# Patient Record
Sex: Female | Born: 1980 | Race: Black or African American | Hispanic: No | Marital: Married | Smoking: Former smoker
Health system: Southern US, Community
[De-identification: ages and names within clinical notes are randomized; demographics above are authoritative.]

## PROBLEM LIST (undated history)

## (undated) ENCOUNTER — Inpatient Hospital Stay (HOSPITAL_COMMUNITY): Payer: BLUE CROSS/BLUE SHIELD

## (undated) DIAGNOSIS — D649 Anemia, unspecified: Secondary | ICD-10-CM

## (undated) DIAGNOSIS — O139 Gestational [pregnancy-induced] hypertension without significant proteinuria, unspecified trimester: Secondary | ICD-10-CM

## (undated) DIAGNOSIS — F419 Anxiety disorder, unspecified: Secondary | ICD-10-CM

## (undated) DIAGNOSIS — I4729 Other ventricular tachycardia: Secondary | ICD-10-CM

## (undated) DIAGNOSIS — E0500 Thyrotoxicosis with diffuse goiter without thyrotoxic crisis or storm: Secondary | ICD-10-CM

## (undated) DIAGNOSIS — L732 Hidradenitis suppurativa: Secondary | ICD-10-CM

## (undated) DIAGNOSIS — D573 Sickle-cell trait: Secondary | ICD-10-CM

## (undated) DIAGNOSIS — D563 Thalassemia minor: Secondary | ICD-10-CM

## (undated) DIAGNOSIS — D571 Sickle-cell disease without crisis: Secondary | ICD-10-CM

## (undated) DIAGNOSIS — M1990 Unspecified osteoarthritis, unspecified site: Secondary | ICD-10-CM

## (undated) DIAGNOSIS — F319 Bipolar disorder, unspecified: Secondary | ICD-10-CM

## (undated) DIAGNOSIS — R002 Palpitations: Secondary | ICD-10-CM

## (undated) DIAGNOSIS — F32A Depression, unspecified: Secondary | ICD-10-CM

## (undated) DIAGNOSIS — Z8632 Personal history of gestational diabetes: Secondary | ICD-10-CM

## (undated) DIAGNOSIS — Z8759 Personal history of other complications of pregnancy, childbirth and the puerperium: Secondary | ICD-10-CM

## (undated) DIAGNOSIS — E782 Mixed hyperlipidemia: Secondary | ICD-10-CM

## (undated) DIAGNOSIS — N921 Excessive and frequent menstruation with irregular cycle: Secondary | ICD-10-CM

## (undated) DIAGNOSIS — F329 Major depressive disorder, single episode, unspecified: Secondary | ICD-10-CM

## (undated) DIAGNOSIS — O24419 Gestational diabetes mellitus in pregnancy, unspecified control: Secondary | ICD-10-CM

## (undated) DIAGNOSIS — E079 Disorder of thyroid, unspecified: Secondary | ICD-10-CM

## (undated) DIAGNOSIS — E039 Hypothyroidism, unspecified: Secondary | ICD-10-CM

## (undated) HISTORY — DX: Gestational (pregnancy-induced) hypertension without significant proteinuria, unspecified trimester: O13.9

## (undated) HISTORY — PX: COLPOSCOPY: SHX161

## (undated) HISTORY — PX: TUBAL LIGATION: SHX77

## (undated) HISTORY — DX: Disorder of thyroid, unspecified: E07.9

## (undated) HISTORY — DX: Hidradenitis suppurativa: L73.2

## (undated) HISTORY — DX: Sickle-cell disease without crisis: D57.1

---
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
## 2003-09-22 ENCOUNTER — Emergency Department (HOSPITAL_COMMUNITY): Admission: EM | Admit: 2003-09-22 | Discharge: 2003-09-22 | Payer: Self-pay | Admitting: Family Medicine

## 2004-07-07 ENCOUNTER — Emergency Department (HOSPITAL_COMMUNITY): Admission: EM | Admit: 2004-07-07 | Discharge: 2004-07-07 | Payer: Self-pay | Admitting: Emergency Medicine

## 2005-03-21 ENCOUNTER — Inpatient Hospital Stay (HOSPITAL_COMMUNITY): Admission: AD | Admit: 2005-03-21 | Discharge: 2005-03-21 | Payer: Self-pay | Admitting: *Deleted

## 2005-12-07 ENCOUNTER — Emergency Department (HOSPITAL_COMMUNITY): Admission: EM | Admit: 2005-12-07 | Discharge: 2005-12-07 | Payer: Self-pay | Admitting: Emergency Medicine

## 2006-02-10 ENCOUNTER — Emergency Department (HOSPITAL_COMMUNITY): Admission: EM | Admit: 2006-02-10 | Discharge: 2006-02-10 | Payer: Self-pay | Admitting: Family Medicine

## 2009-01-06 ENCOUNTER — Emergency Department (HOSPITAL_COMMUNITY): Admission: EM | Admit: 2009-01-06 | Discharge: 2009-01-06 | Payer: Self-pay | Admitting: Family Medicine

## 2010-06-30 ENCOUNTER — Inpatient Hospital Stay (HOSPITAL_COMMUNITY)
Admission: AD | Admit: 2010-06-30 | Discharge: 2010-06-30 | Disposition: A | Discharge status: Home / Self Care | Payer: Self-pay | Source: Ambulatory Visit | Attending: Obstetrics & Gynecology | Admitting: Obstetrics & Gynecology

## 2010-06-30 ENCOUNTER — Inpatient Hospital Stay (HOSPITAL_COMMUNITY): Payer: Self-pay

## 2010-06-30 DIAGNOSIS — R109 Unspecified abdominal pain: Secondary | ICD-10-CM

## 2010-06-30 DIAGNOSIS — N920 Excessive and frequent menstruation with regular cycle: Secondary | ICD-10-CM

## 2010-06-30 DIAGNOSIS — R58 Hemorrhage, not elsewhere classified: Secondary | ICD-10-CM

## 2010-06-30 DIAGNOSIS — N83209 Unspecified ovarian cyst, unspecified side: Secondary | ICD-10-CM | POA: Insufficient documentation

## 2010-06-30 LAB — URINALYSIS, ROUTINE W REFLEX MICROSCOPIC
Bilirubin Urine: NEGATIVE
Hgb urine dipstick: NEGATIVE
Ketones, ur: 15 mg/dL — AB
Nitrite: NEGATIVE
Protein, ur: NEGATIVE mg/dL
Specific Gravity, Urine: >1.03 — ABNORMAL HIGH (ref 1.005–1.030)
Urine Glucose, Fasting: NEGATIVE mg/dL
Urobilinogen, UA: 0.2 mg/dL (ref 0.0–1.0)
pH: 5.5 (ref 5.0–8.0)

## 2010-06-30 LAB — POCT PREGNANCY, URINE: Preg Test, Ur: NEGATIVE

## 2010-06-30 LAB — WET PREP, GENITAL
Trich, Wet Prep: NONE SEEN
Yeast Wet Prep HPF POC: NONE SEEN

## 2010-07-28 ENCOUNTER — Encounter: Payer: Self-pay | Admitting: Physician Assistant

## 2011-02-02 ENCOUNTER — Emergency Department (HOSPITAL_COMMUNITY)
Admission: EM | Admit: 2011-02-02 | Discharge: 2011-02-02 | Disposition: A | Discharge status: Home / Self Care | Payer: Self-pay | Attending: Emergency Medicine | Admitting: Emergency Medicine

## 2011-02-02 ENCOUNTER — Emergency Department (HOSPITAL_COMMUNITY): Payer: Self-pay

## 2011-02-02 DIAGNOSIS — J069 Acute upper respiratory infection, unspecified: Secondary | ICD-10-CM | POA: Insufficient documentation

## 2011-02-02 DIAGNOSIS — R0602 Shortness of breath: Secondary | ICD-10-CM | POA: Insufficient documentation

## 2011-02-02 DIAGNOSIS — J029 Acute pharyngitis, unspecified: Secondary | ICD-10-CM | POA: Insufficient documentation

## 2011-02-02 DIAGNOSIS — R059 Cough, unspecified: Secondary | ICD-10-CM | POA: Insufficient documentation

## 2011-02-02 DIAGNOSIS — D573 Sickle-cell trait: Secondary | ICD-10-CM | POA: Insufficient documentation

## 2011-02-02 DIAGNOSIS — J3489 Other specified disorders of nose and nasal sinuses: Secondary | ICD-10-CM | POA: Insufficient documentation

## 2011-02-02 DIAGNOSIS — R05 Cough: Secondary | ICD-10-CM | POA: Insufficient documentation

## 2011-07-21 DIAGNOSIS — Z9151 Personal history of suicidal behavior: Secondary | ICD-10-CM

## 2011-07-21 HISTORY — DX: Personal history of suicidal behavior: Z91.51

## 2011-08-03 ENCOUNTER — Encounter (HOSPITAL_COMMUNITY): Payer: Self-pay | Admitting: Emergency Medicine

## 2011-08-03 ENCOUNTER — Emergency Department (HOSPITAL_COMMUNITY)
Admission: EM | Admit: 2011-08-03 | Discharge: 2011-08-04 | Disposition: A | Discharge status: Other Acute Inpatient Hospital | Payer: Self-pay | Attending: Emergency Medicine | Admitting: Emergency Medicine

## 2011-08-03 DIAGNOSIS — R45851 Suicidal ideations: Secondary | ICD-10-CM | POA: Insufficient documentation

## 2011-08-03 DIAGNOSIS — F329 Major depressive disorder, single episode, unspecified: Secondary | ICD-10-CM

## 2011-08-03 DIAGNOSIS — F32A Depression, unspecified: Secondary | ICD-10-CM

## 2011-08-03 DIAGNOSIS — D573 Sickle-cell trait: Secondary | ICD-10-CM | POA: Insufficient documentation

## 2011-08-03 DIAGNOSIS — F3289 Other specified depressive episodes: Secondary | ICD-10-CM | POA: Insufficient documentation

## 2011-08-03 HISTORY — DX: Sickle-cell trait: D57.3

## 2011-08-03 NOTE — ED Notes (Signed)
Pt brought by the EMS, pt attempted the suicide, pt tried to cut her left wrist, used a broke light bulbe, at this time bleeding controled, superficial laceration, scuff, CMS intact, pulses present bilaterally. GPS with the pt as well.
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
## 2011-08-04 ENCOUNTER — Inpatient Hospital Stay (HOSPITAL_COMMUNITY)
Admission: AD | Admit: 2011-08-04 | Discharge: 2011-08-07 | DRG: 882 | Disposition: A | Discharge status: Home / Self Care | Payer: PRIVATE HEALTH INSURANCE | Source: Ambulatory Visit | Attending: Psychiatry | Admitting: Psychiatry

## 2011-08-04 ENCOUNTER — Encounter (HOSPITAL_COMMUNITY): Payer: Self-pay | Admitting: *Deleted

## 2011-08-04 DIAGNOSIS — R4589 Other symptoms and signs involving emotional state: Secondary | ICD-10-CM | POA: Diagnosis present

## 2011-08-04 DIAGNOSIS — F1210 Cannabis abuse, uncomplicated: Secondary | ICD-10-CM

## 2011-08-04 DIAGNOSIS — J45909 Unspecified asthma, uncomplicated: Secondary | ICD-10-CM

## 2011-08-04 DIAGNOSIS — D573 Sickle-cell trait: Secondary | ICD-10-CM

## 2011-08-04 DIAGNOSIS — R45851 Suicidal ideations: Secondary | ICD-10-CM

## 2011-08-04 DIAGNOSIS — F4325 Adjustment disorder with mixed disturbance of emotions and conduct: Principal | ICD-10-CM

## 2011-08-04 DIAGNOSIS — Z880 Allergy status to penicillin: Secondary | ICD-10-CM

## 2011-08-04 DIAGNOSIS — Z882 Allergy status to sulfonamides status: Secondary | ICD-10-CM

## 2011-08-04 DIAGNOSIS — F1290 Cannabis use, unspecified, uncomplicated: Secondary | ICD-10-CM | POA: Diagnosis present

## 2011-08-04 LAB — COMPREHENSIVE METABOLIC PANEL
AST: 20 U/L (ref 0–37)
CO2: 27 mEq/L (ref 19–32)
Calcium: 9.6 mg/dL (ref 8.4–10.5)
Creatinine, Ser: 0.6 mg/dL (ref 0.50–1.10)
GFR calc non Af Amer: >90 mL/min (ref >90)

## 2011-08-04 LAB — RAPID URINE DRUG SCREEN, HOSP PERFORMED
Barbiturates: NOT DETECTED
Benzodiazepines: NOT DETECTED
Cocaine: NOT DETECTED

## 2011-08-04 LAB — CBC
MCH: 24.6 pg — ABNORMAL LOW (ref 26.0–34.0)
MCHC: 34.2 g/dL (ref 30.0–36.0)
MCV: 71.9 fL — ABNORMAL LOW (ref 78.0–100.0)
Platelets: 290 10*3/uL (ref 150–400)
RBC: 5.37 MIL/uL — ABNORMAL HIGH (ref 3.87–5.11)
RDW: 14 % (ref 11.5–15.5)

## 2011-08-04 MED ORDER — NICOTINE 21 MG/24HR TD PT24
21.0000 mg | MEDICATED_PATCH | Freq: Every day | TRANSDERMAL | Status: DC
  Administered 2011-08-04: 21 mg via TRANSDERMAL
  Filled 2011-08-04: qty 1

## 2011-08-04 MED ORDER — ALUM & MAG HYDROXIDE-SIMETH 200-200-20 MG/5ML PO SUSP: 30.0000 mL | ORAL | Status: DC | PRN

## 2011-08-04 MED ORDER — MAGNESIUM HYDROXIDE 400 MG/5ML PO SUSP: 30.0000 mL | Freq: Every day | ORAL | Status: DC | PRN

## 2011-08-04 MED ORDER — ZOLPIDEM TARTRATE 5 MG PO TABS: 5.0000 mg | ORAL_TABLET | Freq: Every evening | ORAL | Status: DC | PRN

## 2011-08-04 MED ORDER — ONDANSETRON HCL 4 MG PO TABS: 4.0000 mg | ORAL_TABLET | Freq: Three times a day (TID) | ORAL | Status: DC | PRN

## 2011-08-04 MED ORDER — IBUPROFEN 600 MG PO TABS: 600.0000 mg | ORAL_TABLET | Freq: Three times a day (TID) | ORAL | Status: DC | PRN

## 2011-08-04 MED ORDER — ACETAMINOPHEN 325 MG PO TABS: 650.0000 mg | ORAL_TABLET | Freq: Four times a day (QID) | ORAL | Status: DC | PRN

## 2011-08-04 MED ORDER — ALBUTEROL SULFATE (5 MG/ML) 0.5% IN NEBU
2.5000 mg | INHALATION_SOLUTION | Freq: Four times a day (QID) | RESPIRATORY_TRACT | Status: DC | PRN
  Filled 2011-08-04: qty 0.5

## 2011-08-04 MED ORDER — LORAZEPAM 1 MG PO TABS: 1.0000 mg | ORAL_TABLET | Freq: Three times a day (TID) | ORAL | Status: DC | PRN

## 2011-08-04 MED ORDER — NICOTINE 21 MG/24HR TD PT24
21.0000 mg | MEDICATED_PATCH | Freq: Every day | TRANSDERMAL | Status: DC
  Administered 2011-08-04: 21 mg via TRANSDERMAL
  Filled 2011-08-04 (×7): qty 1

## 2011-08-04 MED ORDER — ACETAMINOPHEN 325 MG PO TABS: 650.0000 mg | ORAL_TABLET | ORAL | Status: DC | PRN

## 2011-08-04 MED ORDER — TRAZODONE HCL 100 MG PO TABS: 100.0000 mg | ORAL_TABLET | Freq: Every evening | ORAL | Status: DC | PRN

## 2011-08-04 NOTE — ED Notes (Signed)
Pt removed nicotine patch stating "it's making me sick"; patch discarded @ RN's station.

## 2011-08-04 NOTE — Progress Notes (Signed)
Patient ID: [name] Curry, female   DOB: [birth year], 31 y.o.   MRN: [id number] Pt. denies lethality and A/V/H's: states she is ""[place]" wanting to smoke (cigars). No c/o pain. Appetite good. Affect is bright and mood is congruent: watching TV and engaging in group activities.

## 2011-08-04 NOTE — ED Notes (Signed)
Attempted to call call report, informed no nurse available, # left for call back, awaiting return call.
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
## 2011-08-04 NOTE — ED Provider Notes (Signed)
History     CSN: [id number]  Arrival date & time 08/03/11  2244   First MD Initiated Contact with Patient 08/04/11 0304      Chief Complaint  Patient presents with  . Suicide Attempt  . Medical Clearance     HPI  History provided by the patient. Patient is a 31 year old female with history of sickle cell trait and depression who presents with complaints of increasing depression, suicidal thoughts. Patient reports having increasing depression and states that she feels like life is pointless. Patient reports she felt so depressed today that she began to make small cuts to her left wrist with a broken light bulb. She talked with her sister who then encourage her to come to the emergency room and called 911. Patient reports having history of similar symptoms with suicidal attempt of overdose in the past. She denies any overdose at this time. She's not currently on any medications for depression. Symptoms are described as severe. Patient denies any other complaints. She is voluntary.    Past Medical History  Diagnosis Date  . Sickle cell trait     Past Surgical History  Procedure Date  . Hemorrhoid surgery   . Colposcopy     History reviewed. No pertinent family history.  History  Substance Use Topics  . Smoking status: Current Everyday Smoker  . Smokeless tobacco: Not on file  . Alcohol Use: Yes    OB History    Grav Para Term Preterm Abortions TAB SAB Ect Mult Living                  Review of Systems  All other systems reviewed and are negative.    Allergies  Penicillins and Sulfa antibiotics  Home Medications   Current Outpatient Rx  Name Route Sig Dispense Refill  . ALBUTEROL SULFATE (2.5 MG/3ML) 0.083% IN NEBU Nebulization Take 2.5 mg by nebulization every 6 (six) hours as needed. For shortness of breath.    Marland Kitchen OVER THE COUNTER MEDICATION Oral Take 1 tablet by mouth daily as needed. OTC allergy relief as needed for allergies.      BP 110/64  Pulse 86   Temp(Src) 97.7 F (36.5 C) (Oral)  Resp 18  SpO2 97%  LMP 07/29/2011  Physical Exam  Nursing note and vitals reviewed. Constitutional: She is oriented to person, place, and time. She appears well-developed and well-nourished. No distress.  HENT:  Head: Normocephalic.  Cardiovascular: Normal rate and regular rhythm.   Pulmonary/Chest: Effort normal and breath sounds normal. No respiratory distress. She has no wheezes.  Neurological: She is alert and oriented to person, place, and time.  Skin: Skin is warm and dry. No rash noted.       Small superficial abrasion/cutto left wrist or laceration  Psychiatric: Her behavior is normal. She exhibits a depressed mood. She expresses suicidal ideation. She expresses no homicidal ideation. She expresses suicidal plans.    ED Course  Procedures   Results for orders placed during the hospital encounter of 08/03/11  CBC      Component Value Range   WBC 10.7 (*) 4.0 - 10.5 (K/uL)   RBC 5.37 (*) 3.87 - 5.11 (MIL/uL)   Hemoglobin 13.2  12.0 - 15.0 (g/dL)   HCT 40.9  81.1 - 91.4 (%)   MCV 71.9 (*) 78.0 - 100.0 (fL)   MCH 24.6 (*) 26.0 - 34.0 (pg)   MCHC 34.2  30.0 - 36.0 (g/dL)   RDW 78.2  95.6 - 21.3 (%)  Platelets 290  150 - 400 (K/uL)  COMPREHENSIVE METABOLIC PANEL      Component Value Range   Sodium 137  135 - 145 (mEq/L)   Potassium 3.6  3.5 - 5.1 (mEq/L)   Chloride 101  96 - 112 (mEq/L)   CO2 27  19 - 32 (mEq/L)   Glucose, Bld 93  70 - 99 (mg/dL)   BUN 10  6 - 23 (mg/dL)   Creatinine, Ser 9.60  0.50 - 1.10 (mg/dL)   Calcium 9.6  8.4 - 45.4 (mg/dL)   Total Protein 7.6  6.0 - 8.3 (g/dL)   Albumin 4.2  3.5 - 5.2 (g/dL)   AST 20  0 - 37 (U/L)   ALT 35  0 - 35 (U/L)   Alkaline Phosphatase 176 (*) 39 - 117 (U/L)   Total Bilirubin 0.2 (*) 0.3 - 1.2 (mg/dL)   GFR calc non Af Amer >90  >90 (mL/min)   GFR calc Af Amer >90  >90 (mL/min)  URINE RAPID DRUG SCREEN (HOSP PERFORMED)      Component Value Range   Opiates NONE DETECTED  NONE  DETECTED    Cocaine NONE DETECTED  NONE DETECTED    Benzodiazepines NONE DETECTED  NONE DETECTED    Amphetamines NONE DETECTED  NONE DETECTED    Tetrahydrocannabinol POSITIVE (*) NONE DETECTED    Barbiturates NONE DETECTED  NONE DETECTED   ETHANOL      Component Value Range   Alcohol, Ethyl (B) <11  0 - 11 (mg/dL)  POCT PREGNANCY, URINE      Component Value Range   Preg Test, Ur NEGATIVE  NEGATIVE       1. Depression   2. Suicidal ideations       MDM  Patient seen and evaluated. Patient in no acute distress.   Psych holding orders are in place. act team has seen patient and evaluate for placement.      Angus Seller, [place] 08/04/11 [phone]
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
## 2011-08-04 NOTE — Tx Team (Signed)
Initial Interdisciplinary Treatment Plan  PATIENT STRENGTHS: (choose at least two) Ability for insight Average or above average intelligence Communication skills General fund of knowledge Motivation for treatment/growth Physical Health  PATIENT STRESSORS: Educational concerns Financial difficulties Marital or family conflict   PROBLEM LIST: Problem List/Patient Goals Date to be addressed Date deferred Reason deferred Estimated date of resolution  SI 08/04/11     depression 08/04/11                                                DISCHARGE CRITERIA:  Ability to meet basic life and health needs Adequate post-discharge living arrangements Improved stabilization in mood, thinking, and/or behavior Motivation to continue treatment in a less acute level of care Need for constant or close observation no longer present Safe-care adequate arrangements made Verbal commitment to aftercare and medication compliance  PRELIMINARY DISCHARGE PLAN: Outpatient therapy Placement in alternative living arrangements  PATIENT/FAMIILY INVOLVEMENT: This treatment plan has been presented to and reviewed with the patient, [name] Curry, and/or family member, .  The patient and family have been given the opportunity to ask questions and make suggestions.  Beatrix Shipper 08/04/2011, 2:46 PM

## 2011-08-04 NOTE — ED Provider Notes (Signed)
Medical screening examination/treatment/procedure(s) were performed by non-physician practitioner and as supervising physician I was immediately available for consultation/collaboration.   Hanley Seamen, MD 08/04/11 [phone]

## 2011-08-04 NOTE — Progress Notes (Signed)
Patient ID: [name] Curry, female   DOB: 12/04/1980, 31 y.o.   MRN: [id number] Pt. denies lethality and A/V/H's or other problems: Pt. has a very bright affect and is mood congruent.  Pt. minimizes her feelings and issues.  Pt. is engaged with peers and is appropriate with staff.
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
## 2011-08-05 DIAGNOSIS — F1290 Cannabis use, unspecified, uncomplicated: Secondary | ICD-10-CM | POA: Diagnosis present

## 2011-08-05 DIAGNOSIS — F4325 Adjustment disorder with mixed disturbance of emotions and conduct: Principal | ICD-10-CM

## 2011-08-05 DIAGNOSIS — R4589 Other symptoms and signs involving emotional state: Secondary | ICD-10-CM | POA: Diagnosis present

## 2011-08-05 HISTORY — DX: Other symptoms and signs involving emotional state: R45.89

## 2011-08-05 MED ORDER — NICOTINE 21 MG/24HR TD PT24
21.0000 mg | MEDICATED_PATCH | Freq: Once | TRANSDERMAL | Status: DC
  Administered 2011-08-05: 21 mg via TRANSDERMAL
  Filled 2011-08-05: qty 1
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
## 2011-08-05 NOTE — Progress Notes (Signed)
At 2300, 08/04/11, patient signed 72 hour Request for Discharge form with MHT.  Asleep by time RN went to speak  with her. Sleeping at long intervals without reported of observed problems.

## 2011-08-05 NOTE — Progress Notes (Signed)
[facility] Group Notes:  (Counselor/Nursing/MHT/Case Management/Adjunct)  08/05/2011 830AM  Type of Therapy:  Discharge Planning  Participation Level:  Active  Summary of Progress/Problems:   Pt attended and participated in aftercare planning group. Pt was given Suicide Prevention information with crisis numbers and hotlines to call. PT stated that she was admitted to [facility] because she had an "episode". When asked to describe her episode pt stated that she flipped out and that she has a bad temper. Pt stated that she is refusing to take medications and that she does not need any medications. PT stated that she slept well last night. Pt spoke to Clinical research associate in private after group and stated "I am not crazy and I do not belong here". Pt stated that she needs to go to work and she can not afford to fail her classes. PT stated that she wants to go home asap. Pt seemed irritable and upset during group. Pt was encouraged to talk to the doctor about her concerns with taking medications. Pt denies SI/HI.  Celestina Gironda 08/05/2011, 9:47 AM
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
## 2011-08-05 NOTE — Progress Notes (Signed)
Patient ID: [name] Curry, female   DOB: Feb 14, 1981, 31 y.o.   MRN: [id number]  [facility] Group Notes:  (Counselor/Nursing/MHT/Case Management/Adjunct)  08/05/2011 1:15 PM  Type of Therapy:  Group Therapy, Dance/Movement Therapy   Participation Level:  Active  Participation Quality:  Appropriate  Affect:  Appropriate  Cognitive:  Appropriate  Insight:  Limited  Engagement in Group:  Good  Engagement in Therapy:  Good  Modes of Intervention:  Clarification, Problem-solving, Role-play, Socialization and Support  Summary of Progress/Problems:Therapist discussed the meaning of self sabotage. Group explored the negative effects of  self sabotage and enabling.  Therapist distributed handout on the Autobiography in [employer]. Group processed handout and discussed a positive behavior or change to encourage themselves.  Pt. said that she, " need to let go of grudges, stop overwhelming myself and give it to God".      Rhunette Croft

## 2011-08-05 NOTE — H&P (Signed)
  Pt was seen by me today and I agree with the key elements documented in H&P.  
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
## 2011-08-05 NOTE — Progress Notes (Signed)
Pt. Denies SI and HI. Rates her depression and hopelessness and helplessness as a 0. Was angry this morning and wanted to leave, but is calmer this afternoon and  Is not pushing to leave as much. Is getting alone with her peers and is coming to an understanding that directing her anger at others is not the healthy way to handle things. Given support and reassurance.
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
## 2011-08-06 DIAGNOSIS — F411 Generalized anxiety disorder: Secondary | ICD-10-CM

## 2011-08-06 MED ORDER — NICOTINE 21 MG/24HR TD PT24
21.0000 mg | MEDICATED_PATCH | Freq: Every day | TRANSDERMAL | Status: DC
  Administered 2011-08-06 – 2011-08-07 (×2): 21 mg via TRANSDERMAL
  Filled 2011-08-06 (×4): qty 1

## 2011-08-06 NOTE — Progress Notes (Signed)
Pt attended the groups today. States that she has a lot of anger issues and wants to change that. "that is why I am here, to learn how to handle my anger. States that if someone goes against her, she loses it. Has ended up in trouble numerous times. Is able to express her concerns about her anger openly, and is looking for a way to help herself direct her anger differently. Understood anger is a second emotion and is willing to work with that. Given support and praise. Rates her depression and hopelessness together at a "0".

## 2011-08-06 NOTE — Progress Notes (Signed)
[facility] Group Notes:  (Counselor/Nursing/MHT/Case Management/Adjunct)  08/06/2011 1315  Type of Therapy:  Group Therapy  Participation Level:  Did Not Attend  Summary of Progress/Problems: Pt attended the group for the first 5 minutes. Pt began to cry and left group before processing. Writer met with pt after group was over. Please refer to progress note for details.    Crosswell, Desiree 08/06/2011, 4:31PM
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
## 2011-08-06 NOTE — Progress Notes (Signed)
[facility] Group Notes:  (Counselor/Nursing/MHT/Case Management/Adjunct)  08/06/2011 0830  Type of Therapy:  Discharge Planning  Participation Level:  Active  Summary of Progress/Problems: Pt. attended and participated in aftercare planning group. [facility] support group information was given as well as information on suicide prevention information, warning signs to look for with suicide and crisis line numbers to use. Pt stated that she had a great dinner visit with her mother, boyfriend and sister last night. Pt was cheerful and smiling in group this morning. Pt stated that she has not taken any medications during her stay at [facility]. Pt stated that she will follow up at [facility] for an appointment. Pt stated that she will be D/C on Monday to live with her boyfriend. Pt denies SI/HI.   Crosswell, Desiree 08/06/2011, 3:25 PM

## 2011-08-06 NOTE — Progress Notes (Signed)
[facility] MD Progress Note  08/06/2011 2:04 PM  Diagnosis:   Axis I: See current hospital problem list Axis II: Deferred Axis III:  Past Medical History  Diagnosis Date  . Sickle cell trait   . Asthma    Axis IV: Unchanged Axis V: 41-50 serious symptoms  ADL's:  Intact  Sleep: Good  Appetite:  Good  Suicidal Ideation:  None Homicidal Ideation:  None  Subjective:  [name] Curry is lying in bed.  She left group because they were talking about support, and she feels frustrated with her family and their trying to control her life.  She had planned to return to live with her boyfriend, but her mother convinced him to not allow that.  She denies any current depression, SI/HI or AVH.  Endorses good sleep and appetite.  AEB (as evidenced by):  Mental Status Examination/Evaluation: Objective:  Appearance: Casual  Eye Contact::  Fair  Speech:  Clear and Coherent  Volume:  Normal  Mood:  Anxious, Depressed and Irritable  Affect:  Congruent  Thought Process:  Circumstantial  Orientation:  Full  Thought Content:  WDL  Suicidal Thoughts:  No  Homicidal Thoughts:  No  Memory:  Remote;   Good  Judgement:  Impaired  Insight:  Lacking  Psychomotor Activity:  Normal  Concentration:  Good  Recall:  Good  Akathisia:  No  Handed:    AIMS (if indicated):     Assets:  Communication Skills Physical Health  Sleep:  Number of Hours: 5    Vital Signs:Blood pressure 126/88, pulse 147, temperature 98.1 F (36.7 C), temperature source Oral, resp. rate 16, height 5\' 3"  (1.6 m), weight 63.05 kg (139 lb), last menstrual period 07/29/2011. Current Medications: Current Facility-Administered Medications  Medication Dose Route Frequency Provider Last Rate Last Dose  . acetaminophen (TYLENOL) tablet 650 mg  650 mg Oral Q6H PRN Curlene Labrum Readling, MD      . albuterol (PROVENTIL) (5 MG/ML) 0.5% nebulizer solution 2.5 mg  2.5 mg Nebulization Q6H PRN Curlene Labrum Readling, MD      . alum & mag hydroxide-simeth  (MAALOX/MYLANTA) 200-200-20 MG/5ML suspension 30 mL  30 mL Oral Q4H PRN Curlene Labrum Readling, MD      . magnesium hydroxide (MILK OF MAGNESIA) suspension 30 mL  30 mL Oral Daily PRN Curlene Labrum Readling, MD      . nicotine (NICODERM CQ - dosed in mg/24 hours) patch 21 mg  21 mg Transdermal Q0600 Curlene Labrum Readling, MD   21 mg at 08/06/11 0650  . traZODone (DESYREL) tablet 100 mg  100 mg Oral QHS PRN Ronny Bacon, MD      . DISCONTD: nicotine (NICODERM CQ - dosed in mg/24 hours) patch 21 mg  21 mg Transdermal Daily Mike Craze, MD   21 mg at 08/04/11 1834  . DISCONTD: nicotine (NICODERM CQ - dosed in mg/24 hours) patch 21 mg  21 mg Transdermal Once Mike Craze, MD   21 mg at 08/05/11 1002    Lab Results: No results found for this or any previous visit (from the past 48 hour(s)).  Physical Findings: AIMS:  , ,  ,  ,    CIWA:    COWS:     Treatment Plan Summary: Daily contact with patient to assess and evaluate symptoms and progress in treatment Medication management  Plan: We will continue her current plan of care.  CM will explore options.  A family session could be helpful.  Denai Caba 08/06/2011, 2:04  PM

## 2011-08-06 NOTE — Progress Notes (Signed)
Pt has been visible in milieu tonight. Her affect is appropriate and facial expressions are spontaneous and of a normal range. No angry outbursts this evening. Pt remains anxious about D/C plans but is calmer this evening than earlier today. Support given. No prn meds requested. Denies SI/HI. Lawrence Marseilles

## 2011-08-06 NOTE — Progress Notes (Signed)
Met pt at start of this writer's shift. No needs at that time. Pt has played cards with peers in dayroom all evening. Observed laughing, relaxed and at ease. At hs pt states she does not need anything for sleep. Appears to minimize issues by stating, "oh, I'm doing great." She denies SI/HI and is safe. Lawrence Marseilles

## 2011-08-07 DIAGNOSIS — F1210 Cannabis abuse, uncomplicated: Secondary | ICD-10-CM

## 2011-08-07 DIAGNOSIS — F489 Nonpsychotic mental disorder, unspecified: Secondary | ICD-10-CM

## 2011-08-07 NOTE — Progress Notes (Signed)
[facility] Group Notes:  (Counselor/Nursing/MHT/Case Management/Adjunct)  08/07/2011 12:02 PM  Type of Therapy:  Group Therapy  Participation Level:  Did Not Attend  Patient appeared briefly in group to announce that she was being discharged and said goodbye to fellow patients.  Mendleson, Jaedyn Lard 08/07/2011, 12:02 PM
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
## 2011-08-07 NOTE — Progress Notes (Signed)
Patient's self inventory sheet, sleeps well, has good appetite, hyper energy level, good attention span.  Rated depression, hopelessness zero.  Denied SI.  No physical problems.  After discharge, plans to think before reacting, [employer] sliding scale to see a therapist.  Appreciates Ms Gildardo Griffes, great people.  Great staff, Arnaldo Natal, Judd Gaudier.  Does have discharge plans.   No problems with meds after discharge. Patient desires discharge today.

## 2011-08-07 NOTE — Progress Notes (Signed)
Discharge Note:  Patient is excited to be discharged.  Plans to go to work this afternoon.   Denied SI and HI.   Denied A/V hallucinations.   Denied pain.  Suicide prevention information was given to patient, discussed with patient, who stated she understood and had no questions.  Patient received all her belongings at discharge.  Stated she appreciated all the staff has done to assist her while she was at [facility].
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
## 2011-08-07 NOTE — Discharge Summary (Signed)
I agree with this D/C Summary.  

## 2011-08-07 NOTE — Tx Team (Signed)
Interdisciplinary Treatment Plan Update (Adult)  Date:  08/07/2011  Time Reviewed:  10:31 AM   Progress in Treatment: Attending groups: Yes Participating in groups:  Yes Taking medication as prescribed:  Yes Tolerating medication: Yes Family/Significant othe contact made:  Yes, contact made with: boyfriend, John Patient understands diagnosis: Yes Discussing patient identified problems/goals with staff:  Yes Medical problems stabilized or resolved: Yes Denies suicidal/homicidal ideation: Yes Issues/concerns per patient self-inventory:  No  Other:  New problem(s) identified: None  Reason for Continuation of Hospitalization: Appropriate for discharge  Interventions implemented related to continuation of hospitalization:  Medication stabilization, safety checks q 15 mins, group attendance  Additional comments:  Estimated length of stay: discharge today  Discharge Plan: Discharge home and follow up with [facility]  New goal(s):  Review of initial/current patient goals per problem list:   1.  Goal(s): Reduce potential for self-harm  Met:  Yes  Target date: by discharge  As evidenced by: [name] denies any suicidal thoughts and any self-harming thoughts  2.  Goal (s): Decrease depressive symptoms  Met:  Yes  Target date: by discharge  As evidenced by: [name] rates depression at 1 today  3.  Goal(s): Medication stabilization   Met:  Yes  Target date: by discharge  As evidenced by: [name] will report decrease in symptoms and no intolerable side effects   Attendees: Patient:  [name] Curry 08/07/2011 10:31 AM  Family:     Physician:  Dr Orson Aloe, MD 08/07/2011 10:31 AM  Nursing:   Quintella Reichert, RN 08/07/2011 10:31 AM  Case Manager:  Juline Patch, LCSW 08/07/2011 10:31 AM  Counselor:  Angus Palms, LCSW 08/07/2011 10:31 AM  Other:  Reyes Ivan, LCSWA 08/07/2011 10:31 AM  Other:  Charlyne Mom, doctoral psych intern 08/07/2011 10:31 AM  Other:  Serena Colonel, NP 08/07/2011 10:31 AM  Other:      Scribe for Treatment Team:   Billie Lade, 08/07/2011 10:31 AM

## 2011-08-07 NOTE — Progress Notes (Addendum)
[facility] Case Management Discharge Plan:  Will you be returning to the same living situation after discharge: Yes,  Patient returning to her home At discharge, do you have transportation home?:Yes,  Boyfriend will transport her home Do you have the ability to pay for your medications:No.  Patient will be assisted with indigent medications  Interagency Information:     Release of information consent forms completed and in the chart;  Patient's signature needed at discharge.  Patient to Follow up at:  Follow-up Information    Follow up with [facility] on 08/08/2011. (You can be seen at [facility] walk -in clinic on Tuesday, Marc h 19, 2013 at 8:00 a.m.)    Contact information:   [address] [place], [place]  [address]  [phone]         Patient denies SI/HI:   Yes,  [name] Curry is not endorsing SI    Safety Planning and Suicide Prevention discussed:  Yes,  Reviewed during d/c planning group  Barrier to discharge identified:No.  Summary and Recommendations:  Follow up with outpatient appointments   [name] Curry, [name] Curry July 08/07/2011, 11:14 AM

## 2011-08-11 NOTE — Progress Notes (Signed)
Patient Discharge Instructions:  Psychiatric Admission Assessment Note Provided,  08/10/2011 After Visit Summary (AVS) Provided,  08/10/2011 Face Sheet Provided, 08/10/2011 Faxed/Sent to the Next Level Care provider:  08/10/2011 Sent Suicide Risk Assessment - Discharge Assessment 08/10/2011  Faxed to [facility] @ [phone]  Wandra Scot, 08/11/2011, 4:52 PM

## 2013-02-10 ENCOUNTER — Encounter: Payer: Self-pay | Admitting: Nurse Practitioner

## 2013-05-22 DIAGNOSIS — E890 Postprocedural hypothyroidism: Secondary | ICD-10-CM

## 2013-05-22 DIAGNOSIS — Z8639 Personal history of other endocrine, nutritional and metabolic disease: Secondary | ICD-10-CM

## 2013-05-22 HISTORY — DX: Personal history of other endocrine, nutritional and metabolic disease: Z86.39

## 2013-05-22 HISTORY — DX: Postprocedural hypothyroidism: E89.0

## 2013-06-03 ENCOUNTER — Emergency Department (HOSPITAL_COMMUNITY): Payer: Managed Care, Other (non HMO)

## 2013-06-03 ENCOUNTER — Emergency Department (HOSPITAL_COMMUNITY)
Admission: EM | Admit: 2013-06-03 | Discharge: 2013-06-03 | Disposition: A | Discharge status: Home / Self Care | Payer: Managed Care, Other (non HMO) | Attending: Emergency Medicine | Admitting: Emergency Medicine

## 2013-06-03 ENCOUNTER — Encounter (HOSPITAL_COMMUNITY): Payer: Self-pay | Admitting: Emergency Medicine

## 2013-06-03 DIAGNOSIS — E049 Nontoxic goiter, unspecified: Secondary | ICD-10-CM

## 2013-06-03 DIAGNOSIS — Z880 Allergy status to penicillin: Secondary | ICD-10-CM | POA: Insufficient documentation

## 2013-06-03 DIAGNOSIS — R51 Headache: Secondary | ICD-10-CM | POA: Insufficient documentation

## 2013-06-03 DIAGNOSIS — Z3202 Encounter for pregnancy test, result negative: Secondary | ICD-10-CM | POA: Insufficient documentation

## 2013-06-03 DIAGNOSIS — R42 Dizziness and giddiness: Secondary | ICD-10-CM | POA: Insufficient documentation

## 2013-06-03 DIAGNOSIS — Z792 Long term (current) use of antibiotics: Secondary | ICD-10-CM | POA: Insufficient documentation

## 2013-06-03 DIAGNOSIS — Z862 Personal history of diseases of the blood and blood-forming organs and certain disorders involving the immune mechanism: Secondary | ICD-10-CM | POA: Insufficient documentation

## 2013-06-03 DIAGNOSIS — R599 Enlarged lymph nodes, unspecified: Secondary | ICD-10-CM

## 2013-06-03 DIAGNOSIS — IMO0002 Reserved for concepts with insufficient information to code with codable children: Secondary | ICD-10-CM | POA: Insufficient documentation

## 2013-06-03 DIAGNOSIS — J45901 Unspecified asthma with (acute) exacerbation: Secondary | ICD-10-CM | POA: Insufficient documentation

## 2013-06-03 DIAGNOSIS — F17200 Nicotine dependence, unspecified, uncomplicated: Secondary | ICD-10-CM | POA: Insufficient documentation

## 2013-06-03 DIAGNOSIS — Z791 Long term (current) use of non-steroidal anti-inflammatories (NSAID): Secondary | ICD-10-CM | POA: Insufficient documentation

## 2013-06-03 DIAGNOSIS — Z8659 Personal history of other mental and behavioral disorders: Secondary | ICD-10-CM | POA: Insufficient documentation

## 2013-06-03 DIAGNOSIS — M542 Cervicalgia: Secondary | ICD-10-CM

## 2013-06-03 DIAGNOSIS — R112 Nausea with vomiting, unspecified: Secondary | ICD-10-CM | POA: Insufficient documentation

## 2013-06-03 DIAGNOSIS — R000 Tachycardia, unspecified: Secondary | ICD-10-CM | POA: Insufficient documentation

## 2013-06-03 HISTORY — DX: Anxiety disorder, unspecified: F41.9

## 2013-06-03 HISTORY — DX: Anemia, unspecified: D64.9

## 2013-06-03 LAB — URINALYSIS, ROUTINE W REFLEX MICROSCOPIC
BILIRUBIN URINE: NEGATIVE
Glucose, UA: NEGATIVE mg/dL
HGB URINE DIPSTICK: NEGATIVE
Ketones, ur: NEGATIVE mg/dL
Leukocytes, UA: NEGATIVE
Nitrite: NEGATIVE
PH: 6 (ref 5.0–8.0)
Protein, ur: NEGATIVE mg/dL
SPECIFIC GRAVITY, URINE: 1.026 (ref 1.005–1.030)
Urobilinogen, UA: 0.2 mg/dL (ref 0.0–1.0)

## 2013-06-03 LAB — CBC WITH DIFFERENTIAL/PLATELET
BASOS PCT: 0 % (ref 0–1)
Basophils Absolute: 0 10*3/uL (ref 0.0–0.1)
EOS ABS: 0 10*3/uL (ref 0.0–0.7)
EOS PCT: 0 % (ref 0–5)
HCT: 37.9 % (ref 36.0–46.0)
HEMOGLOBIN: 12.9 g/dL (ref 12.0–15.0)
LYMPHS PCT: 40 % (ref 12–46)
Lymphs Abs: 4.4 10*3/uL — ABNORMAL HIGH (ref 0.7–4.0)
MCH: 23.9 pg — ABNORMAL LOW (ref 26.0–34.0)
MCHC: 34 g/dL (ref 30.0–36.0)
MCV: 70.2 fL — ABNORMAL LOW (ref 78.0–100.0)
MONOS PCT: 8 % (ref 3–12)
Monocytes Absolute: 0.9 10*3/uL (ref 0.1–1.0)
NEUTROS PCT: 52 % (ref 43–77)
Neutro Abs: 5.8 10*3/uL (ref 1.7–7.7)
Platelets: 302 10*3/uL (ref 150–400)
RBC: 5.4 MIL/uL — AB (ref 3.87–5.11)
RDW: 13.7 % (ref 11.5–15.5)
WBC: 11.1 10*3/uL — AB (ref 4.0–10.5)

## 2013-06-03 LAB — COMPREHENSIVE METABOLIC PANEL
ALT: 31 U/L (ref 0–35)
AST: 19 U/L (ref 0–37)
Albumin: 3.8 g/dL (ref 3.5–5.2)
Alkaline Phosphatase: 198 U/L — ABNORMAL HIGH (ref 39–117)
BUN: 9 mg/dL (ref 6–23)
CALCIUM: 9.7 mg/dL (ref 8.4–10.5)
CO2: 24 meq/L (ref 19–32)
Chloride: 101 mEq/L (ref 96–112)
Creatinine, Ser: 0.54 mg/dL (ref 0.50–1.10)
GFR calc Af Amer: >90 mL/min (ref >90)
Glucose, Bld: 103 mg/dL — ABNORMAL HIGH (ref 70–99)
Potassium: 4.7 mEq/L (ref 3.7–5.3)
SODIUM: 137 meq/L (ref 137–147)
Total Bilirubin: 0.4 mg/dL (ref 0.3–1.2)
Total Protein: 7.7 g/dL (ref 6.0–8.3)

## 2013-06-03 LAB — T4, FREE: Free T4: 4.07 ng/dL — ABNORMAL HIGH (ref 0.80–1.80)

## 2013-06-03 LAB — D-DIMER, QUANTITATIVE (NOT AT ARMC): D DIMER QUANT: 0.31 ug{FEU}/mL (ref 0.00–0.48)

## 2013-06-03 LAB — PREGNANCY, URINE: Preg Test, Ur: NEGATIVE

## 2013-06-03 LAB — TSH

## 2013-06-03 LAB — POCT I-STAT TROPONIN I
TROPONIN I, POC: 0 ng/mL (ref 0.00–0.08)
Troponin i, poc: 0 ng/mL (ref 0.00–0.08)

## 2013-06-03 MED ORDER — OXYCODONE-ACETAMINOPHEN 5-325 MG PO TABS
1.0000 | ORAL_TABLET | Freq: Once | ORAL | Status: AC
  Administered 2013-06-03: 1 via ORAL
  Filled 2013-06-03: qty 1

## 2013-06-03 MED ORDER — CLINDAMYCIN HCL 150 MG PO CAPS: 450.0000 mg | ORAL_CAPSULE | Freq: Three times a day (TID) | ORAL | Status: DC

## 2013-06-03 MED ORDER — SODIUM CHLORIDE 0.9 % IV SOLN
INTRAVENOUS | Status: DC
Start: 2013-06-03 — End: 2013-06-03
  Administered 2013-06-03: 12:00:00 via INTRAVENOUS

## 2013-06-03 MED ORDER — IOHEXOL 300 MG/ML  SOLN
100.0000 mL | Freq: Once | INTRAMUSCULAR | Status: AC | PRN
  Administered 2013-06-03: 100 mL via INTRAVENOUS

## 2013-06-03 MED ORDER — CLINDAMYCIN PHOSPHATE 600 MG/50ML IV SOLN
600.0000 mg | Freq: Once | INTRAVENOUS | Status: AC
  Administered 2013-06-03: 600 mg via INTRAVENOUS
  Filled 2013-06-03 (×2): qty 50

## 2013-06-03 MED ORDER — IOHEXOL 300 MG/ML  SOLN
80.0000 mL | Freq: Once | INTRAMUSCULAR | Status: AC | PRN
  Administered 2013-06-03: 80 mL via INTRAVENOUS

## 2013-06-03 MED ORDER — OXYCODONE-ACETAMINOPHEN 5-325 MG PO TABS: 1.0000 | ORAL_TABLET | Freq: Three times a day (TID) | ORAL | Status: DC | PRN

## 2013-06-03 MED ORDER — ONDANSETRON HCL 4 MG/2ML IJ SOLN
4.0000 mg | Freq: Once | INTRAMUSCULAR | Status: AC
  Administered 2013-06-03: 4 mg via INTRAVENOUS
  Filled 2013-06-03: qty 2

## 2013-06-03 NOTE — ED Provider Notes (Signed)
CSN: [id number]     Arrival date & time 06/03/13  1021 History   First MD Initiated Contact with Patient 06/03/13 1044     Chief Complaint  Patient presents with  . Abscess    drainage under l/arm  . Nausea  . Neck Pain    swelling and tenderness   (Consider location/radiation/quality/duration/timing/severity/associated sxs/prior Treatment) The history is provided by the patient. No language interpreter was used.  [name] Curry is a 33 y/o F with PMHs of asthma, anemia, anxiety presenting to the ED with abscess to the left axilla that has been present for the past 2 weeks. Patient reported that the abscess was much larger last week and stated that she had drainage this past week of this white pus, but reported this morning had hard brown long thin discharge come from the abscess. Patient reported that she has had this abscess for the past 5 years - reported 5 years ago she shaved in this region and led to a hole in her axilla that continues to lead to abscesses. Patient reported that she has been experiencing neck pain localized to the left side described as a soreness and painful all the time that started 2-3 days ago. Patient reported that she has been having chest pain, to the center of her chest, described as a pressure sensation that lasts at least 6 hours with each episode - stated that she gets the pain when resting and with activity. Reported that when she lays down she has gets short of breath and has difficulty breathing. Stated that she has been feeling nauseous these past couple of weeks with emesis x 3 within the past 2 weeks. Patient reported that she has been feeling mildly dizzy for the past couple of days. Denied abdominal pain, changes to bowel movements, fainting, melena, hematochezia, changes to appetite and eating, birth control, hemoptysis, history of blood clots, history of DM and HTN, visual changes, nasal congestion, cough.  PCP none   Past Medical History  Diagnosis Date   . Sickle cell trait   . Asthma   . Anemia   . Anxiety    Past Surgical History  Procedure Laterality Date  . Hemorrhoid surgery    . Colposcopy     Family History  Problem Relation Age of Onset  . Diabetes Other    History  Substance Use Topics  . Smoking status: Current Every Day Smoker -- 18 years    Types: Cigars, Cigarettes  . Smokeless tobacco: Not on file  . Alcohol Use: Yes     Comment:  a beer once a week   OB History   Grav Para Term Preterm Abortions TAB SAB Ect Mult Living                 Review of Systems  Constitutional: Positive for chills. Negative for fever.  Eyes: Negative for visual disturbance.  Respiratory: Positive for shortness of breath. Negative for cough.   Cardiovascular: Positive for chest pain.  Gastrointestinal: Positive for nausea and vomiting. Negative for abdominal pain, diarrhea, constipation and blood in stool.  Musculoskeletal: Positive for neck pain. Negative for back pain.  Skin: Positive for wound (abscess of left axilla).  Neurological: Positive for dizziness and headaches. Negative for weakness.  All other systems reviewed and are negative.    Allergies  Penicillins and Sulfa antibiotics  Home Medications   Current Outpatient Rx  Name  Route  Sig  Dispense  Refill  . diphenhydrAMINE (BENADRYL) 25 MG tablet  Oral   Take 25 mg by mouth every 6 (six) hours as needed.         . naphazoline-glycerin (CLEAR EYES) 0.012-0.2 % SOLN   Both Eyes   Place 1-2 drops into both eyes every 4 (four) hours as needed for irritation.         . naproxen sodium (ANAPROX) 220 MG tablet   Oral   Take 440 mg by mouth 2 (two) times daily with a meal.         . clindamycin (CLEOCIN) 150 MG capsule   Oral   Take 3 capsules (450 mg total) by mouth 3 (three) times daily.   90 capsule   0   . oxyCODONE-acetaminophen (PERCOCET/ROXICET) 5-325 MG per tablet   Oral   Take 1 tablet by mouth every 8 (eight) hours as needed for severe  pain.   9 tablet   0    BP 132/68  Pulse 89  Temp(Src) 98.8 F (37.1 C) (Oral)  Resp 18  SpO2 99%  LMP 05/11/2013 Physical Exam  Nursing note and vitals reviewed. Constitutional: She is oriented to person, place, and time. She appears well-developed and well-nourished. No distress.  HENT:  Head: Normocephalic and atraumatic.  Right Ear: External ear normal.  Left Ear: External ear normal.  Mouth/Throat: Oropharynx is clear and moist. No oropharyngeal exudate.  Eyes: Conjunctivae and EOM are normal. Pupils are equal, round, and reactive to light. Right eye exhibits no discharge. Left eye exhibits no discharge.  Neck: Normal range of motion. Neck supple. No tracheal deviation present.  Negative nuchal rigidity Discomfort upon palpation to the left side of the neck-muscular nature-superficial tenderness Full mobility with discomfort with rotation of the head towards the right and flexion and extension of the neck. Negative warmth upon palpation  Cardiovascular: Regular rhythm and normal heart sounds.  Exam reveals no friction rub.   No murmur heard. Mild tachycardia upon palpation Negative leg swelling bilaterally Negative pitting edema  Pulmonary/Chest: Effort normal and breath sounds normal. No respiratory distress. She has no wheezes. She has no rales. She exhibits tenderness.  Negative crepitus Discomfort upon palpation to the Center the chest-sternal region Good lung expansion Patient able to speak in full sentences without difficulty Negative use of accessory muscles  Negative swelling, erythema, inflammation, lesions, sores, nodules, cysts, puckering, asymmetry, peu d'orange noted to bilateral breast. Negative drainage from the nipples. Negative abnormalities noted to the nipples. Negative deformities noted to the areola. Discomfort upon palpation to the lateral aspect of the left breast upon palpation.   Abdominal: Soft. Bowel sounds are normal. There is no tenderness.  There is no guarding.  Negative acute abdomen Negative peritoneal signs  Musculoskeletal: Normal range of motion.  Full ROM to upper and lower extremities without difficulty noted, negative ataxia noted.  Lymphadenopathy:    She has no cervical adenopathy.  Neurological: She is alert and oriented to person, place, and time. No cranial nerve deficit. She exhibits normal muscle tone. Coordination normal.  Cranial nerves III-XII grossly intact Strength 5+/5+ to upper and lower extremities bilaterally with resistance applied, equal distribution noted Strength intact to MCP, PIP, DIP joints of bilateral hand  Skin: Skin is warm and dry. No rash noted. She is not diaphoretic. No erythema.  Approximately 1 cm x 1 cm indurated abscess localized left axilla. Miniscule holes identified with active drainage of thick white pus noted. Negative swelling, erythema, inflammation or cellulitic infection identified. Negative red streaks.  Psychiatric: She has a normal mood  and affect. Her behavior is normal. Thought content normal.    ED Course  Procedures (including critical care time)  INCISION AND DRAINAGE Performed by: Raymon Mutton Consent: Verbal consent obtained. Risks and benefits: risks, benefits and alternatives were discussed Type: abscess Body area: Left axilla Anesthesia: local infiltration Incision was made with a scalpel. Local anesthetic: lidocaine 2 % without epinephrine Anesthetic total: 3 ml Complexity: complex Blunt dissection to break up loculations Drainage: purulent and bloody  Drainage amount: 5-10 cc Patient tolerance: Patient tolerated the procedure well with no immediate complications.  1:55 PM This provider spoke with attending physician, Dr. Ricarda Frame, discussed case, presentation, history, labs, imaging. Dr. Romeo Apple was not concerned regarding shortness of breath and chest pain-reported that patient is 33 years old, no other significant past medical  history-reported that he does not believe patient is to be admitted regarding chest pain rule out. Dr. Romeo Apple concerned about findings on CT soft tissue neck, recommended ENT consult. ENT consult ordered.   2:08 PM This provider spoke with Dr. Fredderick Erb, ear nose and throat physician-discuss history, presentation, labs and imaging in great detail. Dr. Fredderick Erb reported that he noticed an inflamed node on the left side that could be leading to discomfort along with more inflammation on the right. Reported that he would benefit for the patient to be treated with clindamycin as an outpatient-reported that patient is cleared for discharge. Reported and recommended that patient be followed up with him in his office as an outpatient.   2:21 PM This provider spoke with the patient regarding imaging, labs. Discussed attending and ENT recommendations. Patient understood.   Labs Review Labs Reviewed  CBC WITH DIFFERENTIAL - Abnormal; Notable for the following:    WBC 11.1 (*)    RBC 5.40 (*)    MCV 70.2 (*)    MCH 23.9 (*)    Lymphs Abs 4.4 (*)    All other components within normal limits  COMPREHENSIVE METABOLIC PANEL - Abnormal; Notable for the following:    Glucose, Bld 103 (*)    Alkaline Phosphatase 198 (*)    All other components within normal limits  URINALYSIS, ROUTINE W REFLEX MICROSCOPIC  PREGNANCY, URINE  D-DIMER, QUANTITATIVE  TSH  T4, FREE  POCT I-STAT TROPONIN I  POCT I-STAT TROPONIN I   Imaging Review Dg Chest 2 View  06/03/2013   CLINICAL DATA:  Left axillary abscess  EXAM: CHEST  2 VIEW  COMPARISON:  02/02/2011  FINDINGS: Cardiomediastinal silhouette is stable. No acute infiltrate or pleural effusion. No pulmonary edema. Bony thorax is unremarkable.  IMPRESSION: No active cardiopulmonary disease.   Electronically Signed   By: Natasha Mead M.D.   On: 06/03/2013 12:10   Ct Soft Tissue Neck W Contrast  06/03/2013   CLINICAL DATA:  Neck pain with stiffness and tenderness.  EXAM: CT NECK  WITH CONTRAST  TECHNIQUE: Multidetector CT imaging of the neck was performed using the standard protocol following the bolus administration of intravenous contrast.  CONTRAST:  OMNIPAQUE IOHEXOL 300 MG/ML  SOLN  COMPARISON:  None.  FINDINGS: Suprahyoid neck: Midline soft tissue in the nasopharynx up to 12 mm diameter consistent with inclusion cysts or Thornwaldt cyst. No visible use station tube dysfunction based on could aeration of the mastoids. No paranasal sinus disease.  11 x 17 mm early right tonsillar phlegmon or abscess associated with a tonsillolith. Parapharyngeal fat preserved. No mucosal lesion. Normal epiglottis.  Larynx:  Normal.  Infrahyoid neck: Diffuse thyroid enlargement with multiple subcentimeter hypoattenuating  structures consistent with cysts. Slight substernal extension. Findings consistent with goiter. Correlate clinically. Consider further evaluation with thyroid ultrasound. If patient is clinically hyperthyroid, consider nuclear medicine thyroid uptake and scan.  Lymph nodes: Bilateral level II, III, IV, and V adenopathy with lymph nodes at the level II 12-13 mm short axis. These are likely reactive but clinical correlation recommended to exclude a lymphoproliferative process.  Upper chest/mediastinum: Incompletely evaluated abnormal 2 x 4 cm superior mediastinal soft tissue density representing either enlarged thymus or adenopathy. CT chest with contrast recommended for further evaluation. No lung lesions are seen.  Additional: Craniocervical vasculature is patent premature carotid atherosclerosis is noted at the proximal left internal carotid artery. No internal jugular thrombus. Normal visualized intracranial compartment. Normal cervical spine. Unremarkable orbits.  IMPRESSION: 11 x 17 mm right tonsillar phlegmon or abscess. No evidence for peritonsillar abscess or parapharyngeal fat involvement. No mucosal lesion.  Diffuse bilateral level II, III, IV, V adenopathy, likely reactive  although a lymphoproliferative process (Hodgkin's) not excluded.  Diffuse thyroid enlargement multiple subcentimeter hypoattenuating structures. Findings consistent with multinodular goiter. Consider further evaluation with thyroid ultrasound or nuclear medicine scan if hyperthyroid.  2 x 4 cm cross-section superior mediastinal lesion, question abnormal thymus versus superior mediastinal adenopathy. CT chest with contrast recommended.   Electronically Signed   By: Davonna Belling M.D.   On: 06/03/2013 13:30   Ct Chest W Contrast  06/03/2013   CLINICAL DATA:  Abnormal neck CT, possible thoracic adenopathy  EXAM: CT CHEST WITH CONTRAST  TECHNIQUE: Multidetector CT imaging of the chest was performed during intravenous contrast administration.  CONTRAST:  80mL OMNIPAQUE IOHEXOL 300 MG/ML  SOLN  COMPARISON:  Neck CT from earlier in the same day  FINDINGS: The lungs are well aerated bilaterally without evidence of focal infiltrate or sizable effusion.  The thoracic inlet again demonstrates the thyroid gland to be prominent. Dedicated thyroid ultrasound is again recommended. The thoracic aorta and pulmonary artery as visualized are within normal limits. Triangular soft tissue density is noted in the anterior mediastinum and given the patient's age despite the lymphadenopathy likely represents residual thymus tissue. No significant hilar or mediastinal adenopathy is noted. Bilateral mildly prominent lymph nodes are noted in the axilla bilaterally. The largest of these on the left measures 12 mm in short axis. The largest on the right measures 9 mm in short axis. Scanning into the upper abdomen reveals no acute abnormality. The bony structures are grossly unremarkable.  IMPRESSION: Triangular soft tissue density in the anterior mediastinum which is felt to most likely represent residual thymus given the patient's age.  Prominent thyroid as previously described in CT of the neck.  Bilateral axillary lymph nodes slightly more  prominent on the left than the right. There is a history of a cutaneous lesion in the left axilla in these changes may be reactive in nature. Short-term followup is recommended to assess for stability/regression/progression.   Electronically Signed   By: Alcide Clever M.D.   On: 06/03/2013 15:38    EKG Interpretation    Date/Time:  Tuesday June 03 2013 12:14:46 EST Ventricular Rate:  80 PR Interval:  143 QRS Duration: 89 QT Interval:  365 QTC Calculation: 421 R Axis:   81 Text Interpretation:  Sinus rhythm RSR' in V1 or V2, right VCD or RVH non-spec ST/T changes in V2-V3 Confirmed by HARRISON  MD, FORREST ([id number]) on 06/03/2013 12:20:13 PM            MDM   1. Enlarged  thyroid   2. Lymph nodes enlarged   3. Neck pain     Medications  0.9 %  sodium chloride infusion ( Intravenous New Bag/Given 06/03/13 1229)  clindamycin (CLEOCIN) IVPB 600 mg (600 mg Intravenous New Bag/Given 06/03/13 1534)  ondansetron (ZOFRAN) injection 4 mg (4 mg Intravenous Given 06/03/13 1228)  iohexol (OMNIPAQUE) 300 MG/ML solution 100 mL (100 mLs Intravenous Contrast Given 06/03/13 1243)  oxyCODONE-acetaminophen (PERCOCET/ROXICET) 5-325 MG per tablet 1 tablet (1 tablet Oral Given 06/03/13 1409)  iohexol (OMNIPAQUE) 300 MG/ML solution 80 mL (80 mLs Intravenous Contrast Given 06/03/13 1520)   Filed Vitals:   06/03/13 1059  BP: 132/68  Pulse: 89  Temp: 98.8 F (37.1 C)  TempSrc: Oral  Resp: 18  SpO2: 99%   Patient presenting to emergency department with abscess localized left axilla does been ongoing for the past 2 weeks-decreased in size since last week secondary to active draining. Patient reports that she has this abscess for approximately 5 years. Reported that she's been experiencing chest pain described as a pressure sensation in the last approximately 6 hour intervals for the past 4-5 days occur both at rest and with activity-reports that the chest pain worsens when lying down, reports that she has  shortness of breath. Reported left-sided neck discomfort that has been ongoing for the past 2-3 days, reports as a shooting pain that worsens with motion. Alert and oriented. GCS 15. Mild tachycardia upon auscultation. Radial and DP pulses 2+ bilaterally. Lungs clear to auscultation bilaterally. Discomfort upon palpation to the Center the chest. Negative respiratory distress identified-patient able to speak in full sentences without difficulty. Full range of motion to upper lower extremities bilaterally. Negative ataxia noted. Discomfort upon palpation to the left side of the neck-superficial-pain with rotation of the head towards the right, extension, flexion of the neck. Negative warmth upon palpation. Approximately 1 cm x 1 cm abscess localized left axilla - active drainage of thick white pus-miniscule holes identified x2 near the abscess where the drainage is occurring, appears to be sinus tracts. Breast exam unremarkable.  Urinalysis negative findings for urinary tract infection. Urine pregnancy negative. D-dimer negative elevation-doubt PE. EKG noted normal sinus rhythm-non-ischemic changes identified. Troponin negative elevation. CBC noted mild elevation of white blood cell count of 11.1-negative elevation in neutrophils. CMP negative findings-mildly elevated alkaline phosphatase of 198. Chest x-ray negative acute cardiopulmonary disease noted. CT soft tissue neck noted 11 x 17 mm right tonsillar phlegmon or abscess-no evidence for peritonsillar abscess or parapharyngeal fat involvement. Diffuse bilateral level II, 3, 4, 5 adenopathy likely reactive although Hodgkin's cannot be ruled out. Diffuse thyroid enlargement old pole sub-centimeter hypotension range structures-consistent with multinodular goiter, consider further evaluation with thyroid ultrasound or nuclear medicine scan if hyperthyroid-2 x 4 cm cross-section superior mediastinal lesion question abnormal thymus versus superior mediastinal  adenopathy CT chest with contrast recommended. CT chest with contrast noted trying to go soft tissue density the anterior mediastinal which is felt to most likely represent residual thymus and the patient's age-prominent thyroid identified-bilateral adnexal lymph nodes slightly more prominent the left than the right-recommend a short-term followup regarding this. TSH and T4 levels pending Doubt acute coronary syndrome based on patient's age-discussed possible admission with attending physician who disagreed, did not recommend admission for patient- attending cleared patient for discharge. This provider spoke with ENT physician regarding findings and CT soft tissue neck -as per physician, reported that this is not concerning, reported that patient can be discharged and followed up as an outpatient-recommended clindamycin  to be administered. D-dimer negative elevation-doubt PE. Doubt pneumonia. No evidence of peritonsillar abscess. Doubt sepsis-mild elevation white blood cell count to be 11.1, not impressed-negative leukocytosis. Patient stable, afebrile. Discharged patient. Discharged patient with small dose of pain medications discussed course, cautions, disposal technique. Discharged patient with clindamycin for infection. Discussed with patient to apply warm compressions to go to abscess of her left axilla and the neck. Referred patient to endocrinologist regarding thyroid-discussed with patient that she will need an ultrasound and further workup to be performed. Referred patient to her nose and throat physician to call first thing tomorrow morning. Discussed with patient to closely monitor symptoms and if symptoms are to worsen or change to report back to the ED - strict return instructions given.  Patient agreed to plan of care, understood, all questions answered.    Raymon Mutton, PA-C 06/05/13 [phone]

## 2013-06-03 NOTE — ED Notes (Signed)
Patient transported to CT Pregnancy test -Negative

## 2013-06-03 NOTE — ED Notes (Addendum)
Two week hx of tender,  raised swelling under l/arm. "boil" with increased, large amount thick, clotted  of drainage this am. Hx of infected cyst under l/arm C/o l/neck tenderness and intermittent midsternal pressure, nonradiating

## 2013-06-03 NOTE — Progress Notes (Signed)
[facility] provided pt with a list of primary care resources. Patient stated that she was pending insurance through job.  °

## 2013-06-03 NOTE — Discharge Instructions (Signed)
Please call and set-up an appointment with Dr. Pollyann Kennedy, ENT, he is expecting your phone call to re-assess you in his office Please take medications as prescribed - please take on a full stomach  While on pain medications his be no drinking alcohol, driving, operating any heavy machinery if there is extra please disposer proper manner-please do not take any extra Tylenol for this can lead to Tylenol overdose and liver failure Please cal and set-up an appointment with general surgery regarding abscess to the left armpit that has been continuously refilling for the past 5 years Please call and set-up an appointment with endocrinology regarding enlarged thyroid-will need an ultrasound and further workup and blood work to be performed Please call and set-up an appointment with Breast center to get US performed and mammogram  Please call and set up an appointment with cardiology regarding chest discomfort - may need a stress test These continue to monitor symptoms closely and if symptoms are to worsen or change (fever greater than 101, chills, nausea, vomiting, chest pain, changes chest pain, shortness of breath, difficulty breathing, worsening neck pain, numbness, tingling, inability to swallow, inability to control bowel movements, abdominal pain, nausea, vomiting) please report back to the ED  Hyperthyroidism The thyroid is a large gland located in the lower front part of your neck. The thyroid helps control metabolism. Metabolism is how your body uses food. It controls metabolism with the hormone thyroxine. When the thyroid is overactive, it produces too much hormone. When this happens, these following problems may occur:   Nervousness  Heat intolerance  Weight loss (in spite of increase food intake)  Diarrhea  Change in hair or skin texture  Palpitations (heart skipping or having extra beats)  Tachycardia (rapid heart rate)  Loss of menstruation (amenorrhea)  Shaking of the  hands CAUSES  Grave's Disease (the immune system attacks the thyroid gland). This is the most common cause.  Inflammation of the thyroid gland.  Tumor (usually benign) in the thyroid gland or elsewhere.  Excessive use of thyroid medications (both prescription and 'natural').  Excessive ingestion of Iodine. DIAGNOSIS  To prove hyperthyroidism, your caregiver may do blood tests and ultrasound tests. Sometimes the signs are hidden. It may be necessary for your caregiver to watch this illness with blood tests, either before or after diagnosis and treatment. TREATMENT Short-term treatment There are several treatments to control symptoms. Drugs called beta blockers may give some relief. Drugs that decrease hormone production will provide temporary relief in many people. These measures will usually not give permanent relief. Definitive therapy There are treatments available which can be discussed between you and your caregiver which will permanently treat the problem. These treatments range from surgery (removal of the thyroid), to the use of radioactive iodine (destroys the thyroid by radiation), to the use of antithyroid drugs (interfere with hormone synthesis). The first two treatments are permanent and usually successful. They most often require hormone replacement therapy for life. This is because it is impossible to remove or destroy the exact amount of thyroid required to make a person euthyroid (normal). HOME CARE INSTRUCTIONS  See your caregiver if the problems you are being treated for get worse. Examples of this would be the problems listed above. SEEK MEDICAL CARE IF: Your general condition worsens. MAKE SURE YOU:   Understand these instructions.  Will watch your condition.  Will get help right away if you are not doing well or get worse. Document Released: 05/08/2005 Document Revised: 07/31/2011 Document Reviewed: 09/19/2006 ExitCare  Patient Information 2014 [place],  [place].   Emergency Department Resource Guide 1) Find a Doctor and Pay Out of Pocket Although you won't have to find out who is covered by your insurance plan, it is a good idea to ask around and get recommendations. You will then need to call the office and see if the doctor you have chosen will accept you as a new patient and what types of options they offer for patients who are self-pay. Some doctors offer discounts or will set up payment plans for their patients who do not have insurance, but you will need to ask so you aren't surprised when you get to your appointment.  2) Contact Your Local Health Department Not all health departments have doctors that can see patients for sick visits, but many do, so it is worth a call to see if yours does. If you don't know where your local health department is, you can check in your phone book. The CDC also has a tool to help you locate your state's health department, and many state websites also have listings of all of their local health departments.  3) Find a Walk-in Clinic If your illness is not likely to be very severe or complicated, you may want to try a walk in clinic. These are popping up all over the country in pharmacies, drugstores, and shopping centers. They're usually staffed by nurse practitioners or physician assistants that have been trained to treat common illnesses and complaints. They're usually fairly quick and inexpensive. However, if you have serious medical issues or chronic medical problems, these are probably not your best option.  No Primary Care Doctor: - Call Health Connect at  [phone] - they can help you locate a primary care doctor that  accepts your insurance, provides certain services, etc. - Physician Referral Service- [phone]  Chronic Pain Problems: Organization         Address  Phone   Notes  [facility]  [phone] Patients need to be referred by their primary care doctor.   Medication  Assistance: Organization         Address  Phone   Notes  [facility] [address] [place] [place], [place] [address] [phone] --Must be a resident of [facility] -- Must have NO insurance coverage whatsoever (no Medicaid/ Medicare, etc.) -- The pt. MUST have a primary care doctor that directs their care regularly and follows them in the community   [facility]  [phone]   [employer]  [phone]    Agencies that provide inexpensive medical care: Organization         Address  Phone   Notes  [facility]  [phone]   [facility]    [phone]   [facility] [address] [place], [place] [address] [phone]   [facility] [address] [place] [place] [phone]   Planned Parenthood    [phone]   [facility]    [phone]   [facility]  [address], [place] Phone:  [phone], Fax:  [phone] Hours of Operation:  9 am - 6 pm, M-F.  Also accepts Medicaid/Medicare and self-pay.  [facility]  [address], [place] Phone: [phone], Fax: [phone]. Hours of Operation:  8:30 am - 5:30 pm, M-F.  Also accepts  Medicaid and self-pay.  [facility] [place] [address], [place] Phone: [phone]   [facility] [address] Natasha Bence [place], [place] [phone], Ext. [phone] Mondays & Thursdays: 7-9 AM.  First 15 patients are seen on a first come, first serve basis.    Medicaid-accepting [facility] Providers:  Organization         Address  Phone   Notes  [facility] [address], [place] [phone] Also accepts self-pay patients.  [facility] [address] Laurell Josephs [place], [place]  [phone]   [facility] [address], [place]  [phone]   [facility] [address] [place] [phone]   Renaye Rakers [address], [place]   [phone] Only accepts [place] patients after they have their name applied to their card.   Self-Pay (no insurance) in [facility]:  Organization         Address  Phone   Notes  Sickle Cell Patients, [facility] Internal Medicine [address] [place], [place] [phone]   [facility] [address] [place], [place] [phone]   [facility]  [address], [place] [phone]   [facility]/Dr. Osei-Bonsu  [address] [place] or [address], [place] [phone] Phone number for both [place] and [place] locations is the same.  [facility] [address] [place] [phone]   [facility] [address], [place] or [address] [phone]   [facility] [address] [place] [phone], phone; [phone], fax Sees patients 1st and 3rd Saturday of every month.  Must not qualify for public or private insurance (i.e. Medicaid, Medicare, [place] Health Choice, Veterans' Benefits)  Household income should be no more than 200% of the poverty level The clinic cannot treat you if you are pregnant or think you are pregnant  Sexually transmitted diseases are not treated at the clinic.    Dental Care: Organization         Address  Phone  Notes  [facility] [address] [place], [place] [phone] Accepts children up to age 66 who are enrolled in [place] or [place] Health Choice; pregnant women with a Medicaid card; and children who have applied for Medicaid or [place] Health Choice, but were declined, whose parents can pay a reduced fee at time of service.  [facility]  Department of [facility]  [address], [place] [phone] Accepts children up to age 64 who are enrolled in [place] or [place] Health Choice; pregnant women with a Medicaid card; and children who have applied for Medicaid or [place] Health Choice, but were declined, whose parents can pay a reduced fee at time of service.  [facility]  [address] [place], [place] [phone] Patients are seen by appointment only. Walk-ins are not accepted. [facility] will see patients 77 years of age and older. Monday - Tuesday (8am-5pm) Most Wednesdays (8:30-5pm) $30 per visit, cash only  [facility]  [address], [facility] [phone] Patients are seen by appointment only. Walk-ins are not accepted. [facility] will see  patients 59 years of age and older. One Wednesday Evening (Monthly: Volunteer Based).  $30 per visit, cash only  [employer]  [phone] for adults; Children under age 62, call Graduate Pediatric Dentistry at [phone]. Children aged 97-14, please call [phone] to request a pediatric application.  Dental services are provided in all areas of dental care including fillings, crowns and bridges, complete and partial dentures, implants, gum treatment, root canals, and extractions. Preventive care is also provided. Treatment is provided to both adults and children. Patients are selected via a lottery and there is often a waiting list.   [facility] [address] [place]  [phone] [email or web address]   [facility] [address] [place], [place] [phone], Ext. [phone] Second and Fourth Thursday of each month, opens at 6:30 AM; Clinic ends at 9 AM.  Patients are seen on a first-come first-served basis, and a limited number are seen during each clinic.   [facility]  [address] Ether Griffins [place], [place] [phone]    Eligibility Requirements You must have lived in [place], [place], or [place] counties for at least the last three months.   You cannot be eligible for state or federal sponsored [place], including [employer], [place], or [employer].   You generally cannot be eligible for healthcare insurance through your employer.    How to apply: Eligibility screenings are held every Tuesday and Wednesday afternoon from 1:00 pm until 4:00 pm. You do not need an appointment for the interview!  [facility] [address], [place], [place] [phone]   [facility]  [phone]   [facility]  [phone]   [facility]  [phone]    Behavioral Health Resources in the Community: Intensive Outpatient Programs Organization         Address  Phone  Notes  [facility] [address], [place], [place] [phone]   [facility] [address], [place], [place] [phone]   [facility] [address], [place], [place]  [phone]   [facility] Mental Health [address]  [place], [place] [phone] or [phone]   Substance Abuse Resources Organization         Address  Phone  Notes  [facility]  [phone]   [facility]  [phone]   [facility]  [phone]   Floydene Flock  [phone]   Residential & Outpatient Substance Abuse Program  [phone]   Psychological Services Organization         Address  Phone  Notes  [facility] Behavioral Health  [phone]   [facility]  [phone]   [facility] Mental Health [address] [place] [phone] or [phone]    Mobile Crisis Teams Organization         Address  Phone  Notes  [facility], Mobile Crisis Care Unit  [phone]   [facility]  [address]  [place], [place] [phone]   Doristine Locks [address] [place] [phone]    Self-Help/Support Groups Organization         Address  Phone             Notes  [facility] [place] - variety of support groups  [phone]- I7437963 Call for more information  Narcotics Anonymous (NA), [facility] [address], [employer]  [place]  2 meetings at this location   Residential Treatment Programs Organization         Address  Phone  Notes  [facility] [address],    [place]  [phone]   [facility]  [address], [place] [phone], [place], [place] [phone]   [facility] [address], [place] [phone] Admissions: 8am-3pm M-F  [facility] [address]    [place], [place] [phone]   [facility] [address], [place], [place] [phone]   [facility] [address]  [place], [place] [phone]   [facility] - Intensive Outpatient [address], [place], [place] [phone]   [facility] ([facility] [address]  [place], [place] [phone] or [phone]   [facility] ([facility]) [address] [place], [place] [phone] Accepts Medicaid  [facility] [address]  [place] [phone] Substance Abuse/Addiction Treatment   [facility] Behavioral Health Resources Organization         Address  Phone  Notes  [facility]  [phone]) [phone]   Angie FavaJulie Brannon, PhD [address] [place], [place]   [phone] or [phone]   [facility]   [address] [place], [place] [phone]   [facility] [address], [place], [place] [phone] Insurance/Medicaid/sponsorship through [facility]  [facility] [address]                                    [place], [place] [phone] Therapy/tele-psych/case    [facility] [address]   [place], [place] [phone]) [phone]    Dr. Lolly MustacheArfeen  [phone]   [facility] [place]  [employer] [place] [facility] 1) [address],  2) [address], [place] 3)  [address], [place] [phone]) [phone]  [phone]) [phone]   [facility] Child Abuse Hotline [phone]) [phone] or [phone]) [phone] (After Hours)

## 2013-06-03 NOTE — ED Notes (Signed)
Patient transported to CT 

## 2013-06-05 ENCOUNTER — Encounter (HOSPITAL_COMMUNITY): Payer: Self-pay | Admitting: Emergency Medicine

## 2013-06-05 ENCOUNTER — Emergency Department (HOSPITAL_COMMUNITY)
Admission: EM | Admit: 2013-06-05 | Discharge: 2013-06-05 | Disposition: A | Discharge status: Home / Self Care | Payer: Managed Care, Other (non HMO) | Attending: Emergency Medicine | Admitting: Emergency Medicine

## 2013-06-05 ENCOUNTER — Emergency Department (HOSPITAL_COMMUNITY): Payer: Managed Care, Other (non HMO)

## 2013-06-05 DIAGNOSIS — E049 Nontoxic goiter, unspecified: Secondary | ICD-10-CM | POA: Insufficient documentation

## 2013-06-05 DIAGNOSIS — Z792 Long term (current) use of antibiotics: Secondary | ICD-10-CM | POA: Insufficient documentation

## 2013-06-05 DIAGNOSIS — F17200 Nicotine dependence, unspecified, uncomplicated: Secondary | ICD-10-CM | POA: Insufficient documentation

## 2013-06-05 DIAGNOSIS — Z880 Allergy status to penicillin: Secondary | ICD-10-CM | POA: Insufficient documentation

## 2013-06-05 DIAGNOSIS — Z791 Long term (current) use of non-steroidal anti-inflammatories (NSAID): Secondary | ICD-10-CM | POA: Insufficient documentation

## 2013-06-05 DIAGNOSIS — E0590 Thyrotoxicosis, unspecified without thyrotoxic crisis or storm: Secondary | ICD-10-CM | POA: Insufficient documentation

## 2013-06-05 DIAGNOSIS — I889 Nonspecific lymphadenitis, unspecified: Secondary | ICD-10-CM | POA: Insufficient documentation

## 2013-06-05 DIAGNOSIS — Z862 Personal history of diseases of the blood and blood-forming organs and certain disorders involving the immune mechanism: Secondary | ICD-10-CM | POA: Insufficient documentation

## 2013-06-05 DIAGNOSIS — Z79899 Other long term (current) drug therapy: Secondary | ICD-10-CM | POA: Insufficient documentation

## 2013-06-05 DIAGNOSIS — L040 Acute lymphadenitis of face, head and neck: Secondary | ICD-10-CM

## 2013-06-05 DIAGNOSIS — J36 Peritonsillar abscess: Secondary | ICD-10-CM

## 2013-06-05 DIAGNOSIS — Z8659 Personal history of other mental and behavioral disorders: Secondary | ICD-10-CM | POA: Insufficient documentation

## 2013-06-05 LAB — POCT I-STAT, CHEM 8
BUN: 10 mg/dL (ref 6–23)
Calcium, Ion: 1.2 mmol/L (ref 1.12–1.23)
Chloride: 103 mEq/L (ref 96–112)
Creatinine, Ser: 0.6 mg/dL (ref 0.50–1.10)
Glucose, Bld: 90 mg/dL (ref 70–99)
HCT: 41 % (ref 36.0–46.0)
HEMOGLOBIN: 13.9 g/dL (ref 12.0–15.0)
POTASSIUM: 4 meq/L (ref 3.7–5.3)
Sodium: 139 mEq/L (ref 137–147)
TCO2: 23 mmol/L (ref 0–100)

## 2013-06-05 LAB — CBC WITH DIFFERENTIAL/PLATELET
Basophils Absolute: 0 10*3/uL (ref 0.0–0.1)
Basophils Relative: 0 % (ref 0–1)
EOS ABS: 0.1 10*3/uL (ref 0.0–0.7)
Eosinophils Relative: 1 % (ref 0–5)
HEMATOCRIT: 37.1 % (ref 36.0–46.0)
Hemoglobin: 12.5 g/dL (ref 12.0–15.0)
LYMPHS ABS: 4.8 10*3/uL — AB (ref 0.7–4.0)
Lymphocytes Relative: 47 % — ABNORMAL HIGH (ref 12–46)
MCH: 23.7 pg — AB (ref 26.0–34.0)
MCHC: 33.7 g/dL (ref 30.0–36.0)
MCV: 70.4 fL — ABNORMAL LOW (ref 78.0–100.0)
MONOS PCT: 8 % (ref 3–12)
Monocytes Absolute: 0.8 10*3/uL (ref 0.1–1.0)
Neutro Abs: 4.5 10*3/uL (ref 1.7–7.7)
Neutrophils Relative %: 44 % (ref 43–77)
Platelets: 290 10*3/uL (ref 150–400)
RBC: 5.27 MIL/uL — ABNORMAL HIGH (ref 3.87–5.11)
RDW: 13.7 % (ref 11.5–15.5)
WBC: 10.2 10*3/uL (ref 4.0–10.5)

## 2013-06-05 MED ORDER — HYDROCODONE-ACETAMINOPHEN 5-325 MG PO TABS: 1.0000 | ORAL_TABLET | Freq: Four times a day (QID) | ORAL | Status: DC | PRN

## 2013-06-05 MED ORDER — DIAZEPAM 2 MG PO TABS
2.0000 mg | ORAL_TABLET | Freq: Once | ORAL | Status: AC
Start: 2013-06-05 — End: 2013-06-05
  Administered 2013-06-05: 2 mg via ORAL
  Filled 2013-06-05: qty 1

## 2013-06-05 MED ORDER — IOHEXOL 300 MG/ML  SOLN
80.0000 mL | Freq: Once | INTRAMUSCULAR | Status: AC | PRN
  Administered 2013-06-05: 80 mL via INTRAVENOUS

## 2013-06-05 MED ORDER — METHIMAZOLE 10 MG PO TABS: 10.0000 mg | ORAL_TABLET | Freq: Two times a day (BID) | ORAL | Status: DC

## 2013-06-05 MED ORDER — SODIUM CHLORIDE 0.9 % IV BOLUS (SEPSIS)
1000.0000 mL | Freq: Once | INTRAVENOUS | Status: AC
  Administered 2013-06-05: 1000 mL via INTRAVENOUS

## 2013-06-05 MED ORDER — KETOROLAC TROMETHAMINE 30 MG/ML IJ SOLN
30.0000 mg | Freq: Once | INTRAMUSCULAR | Status: AC
  Administered 2013-06-05: 30 mg via INTRAVENOUS
  Filled 2013-06-05: qty 1

## 2013-06-05 NOTE — ED Provider Notes (Signed)
CSN: [id number]     Arrival date & time 06/05/13  1123 History   First MD Initiated Contact with Patient 06/05/13 1141     Chief Complaint  Patient presents with  . Neck Pain   (Consider location/radiation/quality/duration/timing/severity/associated sxs/prior Treatment) HPI  This is a 33 year old female who presents with neck pain. She was seen and evaluated 2 days ago for the same. At that time she was incidentally found to have a goiter. She is also thought to have a right peritonsillar phlegmon. Patient was placed on antibiotics. She states she has had worsening left neck pain and swelling. She denies any difficulty swallowing or speech difficulty. She denies any fevers. Patient has noted itching and warmth after taking clindamycin. She tolerated IV clindamycin while in the ER the other day. Patient denies any chest pain, shortness of breath.  Past Medical History  Diagnosis Date  . Sickle cell trait   . Asthma   . Anemia   . Anxiety    Past Surgical History  Procedure Laterality Date  . Hemorrhoid surgery    . Colposcopy     Family History  Problem Relation Age of Onset  . Diabetes Other    History  Substance Use Topics  . Smoking status: Current Every Day Smoker -- 18 years    Types: Cigars, Cigarettes  . Smokeless tobacco: Not on file  . Alcohol Use: Yes     Comment:  a beer once a week   OB History   Grav Para Term Preterm Abortions TAB SAB Ect Mult Living                 Review of Systems  Constitutional: Negative for fever.  HENT: Negative for sore throat and trouble swallowing.        Neck swelling  Respiratory: Negative for cough, chest tightness and shortness of breath.   Cardiovascular: Negative for chest pain.  Gastrointestinal: Negative for nausea, vomiting and abdominal pain.  Genitourinary: Negative for dysuria.  Musculoskeletal: Negative for back pain.  Skin: Negative for wound.  Neurological: Negative for headaches.  Psychiatric/Behavioral:  Negative for confusion.  All other systems reviewed and are negative.    Allergies  Penicillins and Sulfa antibiotics  Home Medications   Current Outpatient Rx  Name  Route  Sig  Dispense  Refill  . clindamycin (CLEOCIN) 150 MG capsule   Oral   Take 3 capsules (450 mg total) by mouth 3 (three) times daily.   90 capsule   0   . diphenhydrAMINE (BENADRYL) 25 MG tablet   Oral   Take 25 mg by mouth every 6 (six) hours as needed.         . naphazoline-glycerin (CLEAR EYES) 0.012-0.2 % SOLN   Both Eyes   Place 1-2 drops into both eyes every 4 (four) hours as needed for irritation.         . naproxen sodium (ANAPROX) 220 MG tablet   Oral   Take 440 mg by mouth 2 (two) times daily with a meal.         . oxyCODONE-acetaminophen (PERCOCET/ROXICET) 5-325 MG per tablet   Oral   Take 1 tablet by mouth every 8 (eight) hours as needed for severe pain.   9 tablet   0   . HYDROcodone-acetaminophen (NORCO/VICODIN) 5-325 MG per tablet   Oral   Take 1 tablet by mouth every 6 (six) hours as needed.   15 tablet   0   . methimazole (TAPAZOLE) 10 MG  tablet   Oral   Take 1 tablet (10 mg total) by mouth 2 (two) times daily.   60 tablet   0    BP 125/65  Pulse 83  Temp(Src) 98.9 F (37.2 C)  Resp 20  SpO2 100%  LMP 05/11/2013 Physical Exam  Nursing note and vitals reviewed. Constitutional: She is oriented to person, place, and time. She appears well-developed and well-nourished. No distress.  HENT:  Head: Normocephalic and atraumatic.  Mouth/Throat: Oropharynx is clear and moist.  No trismus, no asymmetry of the tonsils  Eyes: Pupils are equal, round, and reactive to light.  Neck: Normal range of motion. Neck supple.  Bilateral cervical adenopathy  Cardiovascular: Normal rate, regular rhythm and normal heart sounds.   Pulmonary/Chest: Effort normal and breath sounds normal. No respiratory distress. She has no wheezes.  Abdominal: Soft. Bowel sounds are normal. There is  no tenderness.  Musculoskeletal: She exhibits no edema.  Lymphadenopathy:    She has cervical adenopathy.  Neurological: She is alert and oriented to person, place, and time.  Skin: Skin is warm and dry. No rash noted.  Psychiatric: She has a normal mood and affect.    ED Course  Procedures (including critical care time) Labs Review Labs Reviewed  CBC WITH DIFFERENTIAL - Abnormal; Notable for the following:    RBC 5.27 (*)    MCV 70.4 (*)    MCH 23.7 (*)    Lymphocytes Relative 47 (*)    Lymphs Abs 4.8 (*)    All other components within normal limits  POCT I-STAT, CHEM 8   Imaging Review Ct Soft Tissue Neck W Contrast  06/05/2013   CLINICAL DATA:  Worsening left neck pain and swelling, evaluate for abscess.  EXAM: CT NECK WITH CONTRAST  TECHNIQUE: Multidetector CT imaging of the neck was performed using the standard protocol following the bolus administration of intravenous contrast.  CONTRAST:  80mL OMNIPAQUE IOHEXOL 300 MG/ML  SOLN  COMPARISON:  CT of the neck with contrast June 03, 2013  FINDINGS: 6 x 6 mm fluid collection within the right palatine tonsil, decreased in size from prior examination with decreased surrounding enhancement/inflammation. No new fluid collections or abscess. Preservation of the parapharyngeal fat planes. Aerodigestive tract is otherwise unremarkable, patent airway.  Normal appearance the major salivary glands. Thyromegaly is noted, with 19 mm AP isthmus and substernal extent. 8 mm left thyroid nodule, with additional smaller nor nodules noted.  Prominent jugulodigastric lymph nodes, without pathologic features. Apparent intimal thickening of the left internal carotid artery.  Paranasal sinuses appear well aerated. No destructive bony lesions. Included view of the chest demonstrates a hypoenhancing lobulated 42 x 18 mm mass, unchanged.  IMPRESSION: Decrease in size of right tonsil abscess, previously 11 x 17 mm, now 6 x 6 mm with decreased inflammatory changes.  No airway compromise, or new fluid collections.  Jugulodigastric lymphadenopathy is likely reactive.  Thyromegaly, likely reflecting multinodular goiter which would be better characterized on thyroid sonogram as clinically indicated.  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   Electronically Signed   By: Awilda Metro   On: 06/05/2013 13:40    EKG Interpretation   None       MDM   1. Peritonsillar cellulitis   2. Acute cervical adenitis   3. Hyperthyroidism   4. Goiter    Patient presents with continued left neck pain. She is nontoxic-appearing. There is no evidence of  stridor or trismus. I have reviewed the patient's prior visit. TSH is close to 0 and T4 is almost 4. Patient reports a long history of sweats and weight loss.  She has evidence of adenopathy on exam. Repeat CT scan shows decrease in right tonsillar phlegmon. She continues to have reactive adenopathy. Patient was given Norco and Valium with some improvement of her neck pain. I have set the patient up with Dr. Elvera LennoxGherghe for followup endocrinology. She will be started on Methimazole per Dr. Elvera LennoxGherghe for hyperthyroidism. Regarding patient's peritonsillar phlegmon, she has a documented allergy to penicillin and tolerated clindamycin here. I have instructed the patient to premedicate with 25 mg of Benadryl prior to taking clindamycin at home. Given that she's not had any shortness of breath or rash after taking clindamycin, I feel is reasonable to premedicate at this time. She was given strict return precautions. Patient will followup as previously noted.  After history, exam, and medical workup I feel the patient has been appropriately medically screened and is safe for discharge home. Pertinent diagnoses were discussed with the patient. Patient was given return precautions.    Shon Batonourtney F Dominique Ressel, MD 06/05/13 1600

## 2013-06-05 NOTE — Progress Notes (Signed)
   CARE MANAGEMENT ED NOTE 06/05/2013  Patient:  [name][name] Curry   Account Number:  [id number]  Date Initiated:  06/05/2013  Documentation initiated by:  [name] Curry Curry,[name] Curry Curry  Subjective/Objective Assessment:   33 yr old self pay [place] female with 2 ED visits in last 3 days c/o pain has traveled from neck to left side of head/neck/jaw/teeth; states left side of face was swollen last night; states meds we gave her made her throw up     Subjective/Objective Assessment Detail:   and created hives with itching Cm consulted by EDP, [name] Curry for medication assistance for the pt for [name] Curry Curry ([name] Curry Curry) Pt was [facility] by [facility] on 06/03/13 and provided pcp, orange card resources     Action/Plan:   Cm spoke with EDP, pt and ED RN Cm provided pt with a coupon for rmethimazole from online CM encouraged pt to use the resources provided by [facility] staff to find pcp and possible one including a discounted pharmacy   Action/Plan Detail:   see notes below   Anticipated DC Date:  06/05/2013     Status Recommendation to Physician:   Result of Recommendation:    Other ED Services  Consult Working Plan    DC Planning Services  Other  PCP issues  Outpatient Services - Pt will follow up  Medication Assistance  MATCH Program    Choice offered to / List presented to:            Status of service:  Completed, signed off  ED Comments:  [name] FriedlanderStacy Curry  Signed  Progress Notes Service date: 06/03/2013 10:57 AM Related encounter: Admission (Discharged) from 06/03/2013 in [facility] provided pt with a list of primary care resources.  ED Comments Detail:  CM reviewed EPIC notes and chart review information CM spoke with the pt about [facility] ($3 co pay for each Rx through [facility], does not include refills, 7 day expiration of MATCH letter and choice of pharmacies) Pt agreed to receive assistance from program CM spoke with pt who confirms self [place]. CM discussed and provided written information for self pay pcps, importance of pcp for f/u care, www.needymeds.org, discounted pharmacies and other [place] resources such as financial assistance, DSS and  health department  Reviewed resources for [place] [facility] like [facility], [facility] at [address], [facility], general medical clinics, [facility] plus others, [facility] out [facility]ies, housing, and other resources [place]. Pt voiced understanding and appreciation of resources provided Pt is eligible for [facility] program (unable to find pt listed in PDMI per cardholder name inquiry) PDMI information entered. MATCH letter completed and provided to pt. CM updated EDP and ED RN

## 2013-06-05 NOTE — ED Notes (Signed)
Pt states seen two days ago for same; states pain has traveled from neck to left side of head/neck/jaw/teeth; states left side of face was swollen last night; states meds we gave her made her throw up and created hives with itching

## 2013-06-05 NOTE — ED Notes (Signed)
Patient transported to CT 

## 2013-06-05 NOTE — Discharge Instructions (Signed)
Lymphadenopathy °Lymphadenopathy means "disease of the lymph glands." But the term is usually used to describe swollen or enlarged lymph glands, also called lymph nodes. These are the bean-shaped organs found in many locations including the neck, underarm, and groin. Lymph glands are part of the immune system, which fights infections in your body. Lymphadenopathy can occur in just one area of the body, such as the neck, or it can be generalized, with lymph node enlargement in several areas. The nodes found in the neck are the most common sites of lymphadenopathy. °CAUSES  °When your immune system responds to germs (such as viruses or bacteria ), infection-fighting cells and fluid build up. This causes the glands to grow in size. This is usually not something to worry about. Sometimes, the glands themselves can become infected and inflamed. This is called lymphadenitis. °Enlarged lymph nodes can be caused by many diseases: °· Bacterial disease, such as strep throat or a skin infection. °· Viral disease, such as a common cold. °· Other germs, such as lyme disease, tuberculosis, or sexually transmitted diseases. °· Cancers, such as lymphoma (cancer of the lymphatic system) or leukemia (cancer of the white blood cells). °· Inflammatory diseases such as lupus or rheumatoid arthritis. °· Reactions to medications. °Many of the diseases above are rare, but important. This is why you should see your caregiver if you have lymphadenopathy. °SYMPTOMS  °· Swollen, enlarged lumps in the neck, back of the head or other locations. °· Tenderness. °· Warmth or redness of the skin over the lymph nodes. °· Fever. °DIAGNOSIS  °Enlarged lymph nodes are often near the source of infection. They can help healthcare providers diagnose your illness. For instance:  °· Swollen lymph nodes around the jaw might be caused by an infection in the mouth. °· Enlarged glands in the neck often signal a throat infection. °· Lymph nodes that are swollen  in more than one area often indicate an illness caused by a virus. °Your caregiver most likely will know what is causing your lymphadenopathy after listening to your history and examining you. Blood tests, x-rays or other tests may be needed. If the cause of the enlarged lymph node cannot be found, and it does not go away by itself, then a biopsy may be needed. Your caregiver will discuss this with you. °TREATMENT  °Treatment for your enlarged lymph nodes will depend on the cause. Many times the nodes will shrink to normal size by themselves, with no treatment. Antibiotics or other medicines may be needed for infection. Only take over-the-counter or prescription medicines for pain, discomfort or fever as directed by your caregiver. °HOME CARE INSTRUCTIONS  °Swollen lymph glands usually return to normal when the underlying medical condition goes away. If they persist, contact your health-care provider. He/she might prescribe antibiotics or other treatments, depending on the diagnosis. Take any medications exactly as prescribed. Keep any follow-up appointments made to check on the condition of your enlarged nodes.  °SEEK MEDICAL CARE IF:  °· Swelling lasts for more than two weeks. °· You have symptoms such as weight loss, night sweats, fatigue or fever that does not go away. °· The lymph nodes are hard, seem fixed to the skin or are growing rapidly. °· Skin over the lymph nodes is red and inflamed. This could mean there is an infection. °SEEK IMMEDIATE MEDICAL CARE IF:  °· Fluid starts leaking from the area of the enlarged lymph node. °· You develop a fever of 102° F (38.9° C) or greater. °· Severe   pain develops (not necessarily at the site of a large lymph node).  You develop chest pain or shortness of breath.  You develop worsening abdominal pain. MAKE SURE YOU:   Understand these instructions.  Will watch your condition.  Will get help right away if you are not doing well or get worse. Document  Released: 02/15/2008 Document Revised: 07/31/2011 Document Reviewed: 02/15/2008 [facility] Patient Information 2014 [place], [place]. Tonsillitis Tonsillitis is an infection of the throat that causes the tonsils to become red, tender, and swollen. Tonsils are collections of lymphoid tissue at the back of the throat. Each tonsil has crevices (crypts). Tonsils help fight nose and throat infections and keep infection from spreading to other parts of the body for the first 18 months of life.  CAUSES Sudden (acute) tonsillitis is usually caused by infection with streptococcal bacteria. Long-lasting (chronic) tonsillitis occurs when the crypts of the tonsils become filled with pieces of food and bacteria, which makes it easy for the tonsils to become repeatedly infected. SYMPTOMS  Symptoms of tonsillitis include:  A sore throat, with possible difficulty swallowing.  White patches on the tonsils.  Fever.  Tiredness.  New episodes of snoring during sleep, when you did not snore before.  Small, foul-smelling, yellowish-white pieces of material (tonsilloliths) that you occasionally cough up or spit out. The tonsilloliths can also cause you to have bad breath. DIAGNOSIS Tonsillitis can be diagnosed through a physical exam. Diagnosis can be confirmed with the results of lab tests, including a throat culture. TREATMENT  The goals of tonsillitis treatment include the reduction of the severity and duration of symptoms and prevention of associated conditions. Symptoms of tonsillitis can be improved with the use of steroids to reduce the swelling. Tonsillitis caused by bacteria can be treated with antibiotics. Usually, treatment with antibiotics is started before the cause of the tonsillitis is known. However, if it is determined that the cause is not bacterial, antibiotics will not treat the tonsillitis. If attacks of tonsillitis are severe and frequent, your caregiver may recommend surgery to remove the tonsils  (tonsillectomy). HOME CARE INSTRUCTIONS   Rest as much as possible and get plenty of sleep.  Drink plenty of fluids. While the throat is very sore, eat soft foods or liquids, such as sherbet, soups, or instant breakfast drinks.  Eat frozen ice pops.  Gargle with a warm or cold liquid to help soothe the throat. Mix 1/4 teaspoon of salt and 1/4 teaspoon of baking soda in in 8 oz of water. SEEK MEDICAL CARE IF:   Large, tender lumps develop in your neck.  A rash develops.  A green, yellow-brown, or bloody substance is coughed up.  You are unable to swallow liquids or food for 24 hours.  You notice that only one of the tonsils is swollen. SEEK IMMEDIATE MEDICAL CARE IF:   You develop any new symptoms such as vomiting, severe headache, stiff neck, chest pain, or trouble breathing or swallowing.  You have severe throat pain along with drooling or voice changes.  You have severe pain, unrelieved with recommended medications.  You are unable to fully open the mouth.  You develop redness, swelling, or severe pain anywhere in the neck.  You have a fever. MAKE SURE YOU:   Understand these instructions.  Will watch your condition.  Will get help right away if you are not doing well or get worse. Document Released: 02/15/2005 Document Revised: 01/08/2013 Document Reviewed: 10/25/2012 [facility] Patient Information 2014 [employer], [place]. Hyperthyroidism The thyroid is a  large gland located in the lower front part of your neck. The thyroid helps control metabolism. Metabolism is how your body uses food. It controls metabolism with the hormone thyroxine. When the thyroid is overactive, it produces too much hormone. When this happens, these following problems may occur:   Nervousness  Heat intolerance  Weight loss (in spite of increase food intake)  Diarrhea  Change in hair or skin texture  Palpitations (heart skipping or having extra beats)  Tachycardia (rapid heart  rate)  Loss of menstruation (amenorrhea)  Shaking of the hands CAUSES  Grave's Disease (the immune system attacks the thyroid gland). This is the most common cause.  Inflammation of the thyroid gland.  Tumor (usually benign) in the thyroid gland or elsewhere.  Excessive use of thyroid medications (both prescription and 'natural').  Excessive ingestion of Iodine. DIAGNOSIS  To prove hyperthyroidism, your caregiver may do blood tests and ultrasound tests. Sometimes the signs are hidden. It may be necessary for your caregiver to watch this illness with blood tests, either before or after diagnosis and treatment. TREATMENT Short-term treatment There are several treatments to control symptoms. Drugs called beta blockers may give some relief. Drugs that decrease hormone production will provide temporary relief in many people. These measures will usually not give permanent relief. Definitive therapy There are treatments available which can be discussed between you and your caregiver which will permanently treat the problem. These treatments range from surgery (removal of the thyroid), to the use of radioactive iodine (destroys the thyroid by radiation), to the use of antithyroid drugs (interfere with hormone synthesis). The first two treatments are permanent and usually successful. They most often require hormone replacement therapy for life. This is because it is impossible to remove or destroy the exact amount of thyroid required to make a person euthyroid (normal). HOME CARE INSTRUCTIONS  See your caregiver if the problems you are being treated for get worse. Examples of this would be the problems listed above. SEEK MEDICAL CARE IF: Your general condition worsens. MAKE SURE YOU:   Understand these instructions.  Will watch your condition.  Will get help right away if you are not doing well or get worse. Document Released: 05/08/2005 Document Revised: 07/31/2011 Document Reviewed:  09/19/2006 [facility] Patient Information 2014 [place], [place].

## 2013-06-05 NOTE — ED Provider Notes (Signed)
Medical screening examination/treatment/procedure(s) were conducted as a shared visit with non-physician practitioner(s) and myself.  I personally evaluated the patient during the encounter.  EKG Interpretation    Date/Time:  Tuesday June 03 2013 12:14:46 EST Ventricular Rate:  80 PR Interval:  143 QRS Duration: 89 QT Interval:  365 QTC Calculation: 421 R Axis:   81 Text Interpretation:  Sinus rhythm RSR' in V1 or V2, right VCD or RVH ED PHYSICIAN INTERPRETATION AVAILABLE IN [facility] Confirmed by TEST, RECORD ([id number]) on 06/05/2013 12:04:23 PM           I interviewed and examined the patient. Lungs are CTAB. Cardiac exam wnl. Abdomen soft.  Mild fullness w/ moderate ttp of left lateral neck. CT neck concerning for phlegmon vs abscess. Case discussed w/ ENT by PA. Will give dose of IV clinda here and get close f/u w/ ENT. Pt has no trismus on exam, is swallowing normally w/out pain, no difficulty breathing. She is well appearing. She also notes intermittent cp for several weeks. D-dimer is neg and she is low risk per PERC. Low risk for MACE per HEART criteria as well. Pt's only RF for cardiac disease is smoking. She is AFVSS here and appears well. Will rec pt also f/u w/ pcp for chronic cp.    Junius ArgyleForrest S Deklyn Gibbon, MD 06/05/13 1322

## 2013-06-05 NOTE — Progress Notes (Signed)
                  Dear __________ [name] Curry _________________:  Bonita QuinYou have been approved to have your discharge prescriptions filled through our [facility] ([facility]) program. This program allows for a one-time (no refills) 34-day supply of selected medications for a low copay amount.  The copay is $3.00 per prescription. For instance, if you have one prescription, you will pay $3.00; for two prescriptions, you pay $6.00; for three prescriptions, you pay $9.00; and so on.  Only certain pharmacies are participating in this program with [facility]. You will need to select one of the pharmacies from the attached list and take your prescriptions, this letter, and your photo ID to one of the participating pharmacies.   We are excited that you are able to use the [facility] program to get your medications. These prescriptions must be filled within 7 days of hospital discharge or they will no longer be valid for the [facility] program. Should you have any problems with your prescriptions please contact your case management team member at [phone].  Thank you,   [facility]   Participating [facility]  [facility]   [facility] [id number] [place]   [facility] [address] [place]   [facility] [address], [address] [place], [place]   [facility]   [address], [place], [place]   [id number], [place], [place]   [address], [place], [place]   [id number] [address], [place], [place]   [id number], [place], [place]   [id number] [address], [place], [place]   [address], [place], [place]   [address], [place], [place]   [address], [place], [place]   [id number], [place], [place]  [facility]   [address], [place], [place]   [id number] [place]   [id number], [place], [place]   [id number] [address], [place], [place]   [address], [place], [place]   [id number] [place] [address], [place], [place] [facility]   [address], [place], [place]   [address], [place], [place]   [id number], [place], [place]   [address], [place], [place]   [address], [place], [place]   [address], [place], [place]   [address], [place], [place]   [address], [place], [place]   [id number] [address], [place], [place]   [address], [place], [place]   [address], [place], [place]   [address], [place], [place]   [address], [place], [place]   [id number] [place], [place], [place]  Independent Pharmacies   [facility] [address], [address] [place], [place]   [facility] [address] [place], [place]   [facility] [address] [place], [place]   For continued medication needs, please contact the [facility] at  [phone].

## 2013-06-09 ENCOUNTER — Encounter: Payer: Managed Care, Other (non HMO) | Admitting: Internal Medicine

## 2013-06-10 ENCOUNTER — Ambulatory Visit (INDEPENDENT_AMBULATORY_CARE_PROVIDER_SITE_OTHER): Payer: Managed Care, Other (non HMO) | Admitting: Internal Medicine

## 2013-06-10 ENCOUNTER — Encounter: Payer: Self-pay | Admitting: Internal Medicine

## 2013-06-10 VITALS — BP 100/58 | HR 88 | Temp 99.4°F | Resp 12 | Ht 64.0 in | Wt 149.0 lb

## 2013-06-10 DIAGNOSIS — E0500 Thyrotoxicosis with diffuse goiter without thyrotoxic crisis or storm: Secondary | ICD-10-CM | POA: Insufficient documentation

## 2013-06-10 NOTE — Progress Notes (Addendum)
Patient ID: [name] Curry, female   DOB: 07-15-1980, 33 y.o.   MRN: [id number]   HPI  [name] Curry is a 33 y.o.-year-old female, referred by her PCP, Dr. 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  She was seen in the ED for a peritonsillar abscess recently and was also found to have a goiter and thyrotoxicosis.  CT neck (06/05/2013):  Decrease in size of right tonsil abscess, previously 11 x 17 mm, now 6 x 6 mm with decreased inflammatory changes.  Thyromegaly is noted, with 19 mm AP isthmus and substernal extent. 8 mm left thyroid nodule, with additional smaller nor nodules noted.  Prominent jugulodigastric lymph nodes, without pathologic features.  Apparent intimal thickening of the left internal carotid artery.   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  Pt was started on MMI 10 mg bid and referred to endocrine.  I reviewed pt's thyroid tests: Lab Results  Component Value Date   TSH <0.008* 06/03/2013   FREET4 4.07* 06/03/2013    Pt is feeling nodules in neck, + hoarseness - always, + dysphagia/odynophagia, SOB with lying down.  Pt c/o: - + excessive sweating/heat intolerance - + tremors - + anxiety - + palpitations - + problems with concentration - + fatigue - no hyperdefecation - frequent urination - + weight loss: 2013: 165 >> 127 lbs (unintentional), now 144 lbs (eats a lot).   She does have irregular menstrual cycles. She asks me if this is the reason why she could not get pregnant in the past and this is certainly a possibility.   Pt does not have a FH of thyroid ds. No FH of thyroid cancer. No h/o radiation tx to head or neck.  No seaweed or kelp, no recent contrast studies. No steroid use. No herbal supplements.   ROS: Constitutional:see above, + excessive urination Eyes: + blurry vision, no xerophthalmia ENT:+ sore throat, + nodules palpated in throat, + dysphagia/no  odynophagia, + hoarseness, + tinnitus, Cardiovascular:+ CP/+ SOB/+  palpitations/+ leg swelling Respiratory: no cough/+ SOB/+ wheezing Gastrointestinal: + N/+ V/D/C, + heartburn Musculoskeletal: + muscle aches/no  joint aches Skin: + rash, + itching, + easy bruising, + hair loss Neurological:+ tremors/no numbness/tingling/dizziness, + HA Psychiatric: + both: depression/anxiety Low libido, irreg. Menstrual cycles  Past Medical History  Diagnosis Date  . Sickle cell trait   . Asthma   . Anemia   . Anxiety   . Thyroid disease    Past Surgical History  Procedure Laterality Date  . Hemorrhoid surgery    . Colposcopy     History   Social History  . Marital Status: Single, engaged    Spouse Name: N/A    Number of Children: 0   Occupational History  . Hospitality   Social History Main Topics  . Smoking status: Current Every Day Smoker -- 18 years    Types: Cigars, Cigarettes  . Smokeless tobacco: Not on file  . Alcohol Use: Yes     Comment:  a beer once a week  . Drug Use: Yes    Special: Marijuana 3x a mo  . Sexual Activity: Yes   Current Outpatient Prescriptions on File Prior to Visit  Medication Sig Dispense Refill  . clindamycin (CLEOCIN) 150 MG capsule Take 3 capsules (450 mg total) by mouth 3 (three) times daily.  90 capsule  0  . diphenhydrAMINE (BENADRYL) 25 MG tablet Take 25 mg by mouth every 6 (six) hours  as needed.      Marland Kitchen HYDROcodone-acetaminophen (NORCO/VICODIN) 5-325 MG per tablet Take 1 tablet by mouth every 6 (six) hours as needed.  15 tablet  0  . methimazole (TAPAZOLE) 10 MG tablet Take 1 tablet (10 mg total) by mouth 2 (two) times daily.  60 tablet  0  . naphazoline-glycerin (CLEAR EYES) 0.012-0.2 % SOLN Place 1-2 drops into both eyes every 4 (four) hours as needed for irritation.      . naproxen sodium (ANAPROX) 220 MG tablet Take 440 mg by mouth 2 (two) times daily with a meal.      . oxyCODONE-acetaminophen (PERCOCET/ROXICET) 5-325 MG per tablet Take 1  tablet by mouth every 8 (eight) hours as needed for severe pain.  9 tablet  0   No current facility-administered medications on file prior to visit.   Allergies  Allergen Reactions  . Penicillins Hives and Nausea And Vomiting  . Sulfa Antibiotics Hives and Nausea And Vomiting   Family History  Problem Relation Age of Onset  . Diabetes Other   . Hyperlipidemia Father    PE: BP 100/58  Pulse 88  Temp(Src) 99.4 F (37.4 C) (Oral)  Resp 12  Ht 5\' 4"  (1.626 m)  Wt 149 lb (67.586 kg)  BMI 25.56 kg/m2  SpO2 98%  LMP 05/11/2013 Wt Readings from Last 3 Encounters:  06/10/13 149 lb (67.586 kg)  08/04/11 139 lb (63.05 kg)   Constitutional: normal weight, in NAD Eyes: PERRLA, EOMI, + mild exophthalmos (bilateral, 22 mm on exophthalmometer - at 100 mm), no lid lag, no stare ENT: moist mucous membranes, + tender thyromegaly, no thyroid bruits, no cervical lymphadenopathy Cardiovascular: RRR, No MRG Respiratory: CTA B Gastrointestinal: abdomen soft, NT, ND, BS+ Musculoskeletal: no deformities, strength intact in all 4 Skin: moist, warm, no rashes Neurological: + tremor with outstretched hands, DTR normal in all 4  ASSESSMENT: 1. Thyrotoxycosis  PLAN:  1. Patient with a recently found low TSH, with thyrotoxic sxs: weight loss, heat intolerance, tremors, palpitations, anxiety.  - she does not appear to have exogenous causes for the low TSH.  - We discussed that possible causes of thyrotoxicosis are:  Graves ds  (most likely, especially due to long duration of sxs, exophthalmos and enlarged thymus gland) Thyroiditis toxic multinodular goiter/ toxic adenoma (I cannot feel nodules at palpation of his thyroid, but had one on CT neck). - If the tests remain abnormal (which I suspect) I will obtain an uptake and scan to differentiate between the 3 above possible etiologies. Due to her new job, her insurance starts in 1 mo from now so we will need to schedule the test in > 1 mo from now.  -  will check the TSH, fT3 and fT4 and also had thyroid stimulating antibodies to screen for Graves' disease, but will do these in 1 mo. - we discussed about possible modalities of treatment for the above conditions, to include methimazole use, radioactive iodine ablation or surgery. - I did advice her that we might need to do thyroid ultrasound depending on the results of the uptake and scan (if a cold nodule is present) - I do not feel that we need to add beta blockers at this time, since she is not tachycardic, anxious, or tremulous - I advised her to join my chart to communicate easier - RTC in 3 months, but sooner for repeat labs  CLINICAL DATA: Diffuse toxic goiter, thyrotoxicosis, TSH = 0.008  EXAM: THYROID SCAN AND UPTAKE - 24 HOURS  TECHNIQUE: Following the per oral administration of I-131 sodium iodide, the patient returned at 24 hours and uptake measurements were acquired with the uptake probe centered on the neck. Thyroid imaging was performed following the intravenous administration of the Tc-980m Pertechnetate.  RADIOPHARMACEUTICALS: 16.0 microCuries I-131 Sodium Iodide and 10.0 mCi TC-8480m Pertechnetate  COMPARISON: None  FINDINGS: 24 hr radio iodine uptake is calculated at 46%, above the normal range consistent with hyperthyroidism.  Images in 3 projections demonstrate diffuse enlargement of thyroid lobes bilaterally.  Multiple small areas of slightly increased in decreased tracer localization identified suggesting diffuse small nodules.  No dominant cold or hot nodule identified.  IMPRESSION: Elevated 24 hr radio iodine uptake of 46% consistent with hyperthyroidism.  Enlarged bilateral thyroid lobes with suspect diffuse mild nodularity without dominant mass.   Electronically Signed By: Ulyses SouthwardMark Boles M.D. On: 07/31/2013 10:58  I called pt to come for a repeat TFTs before deciding for treatment. She was off MMI x 2 weeks prior to the Uptake and scan >> itching,  rash, sweating. She had these sxs after she started Clindamycin and is not sure if it was not from that. Decided to have her come today for labs and I will let her know ASAP if we can continue MMI or we need to RAI tx.

## 2013-06-10 NOTE — Patient Instructions (Addendum)
Please stop Methimazole 4 days before the Thyroid Uptake and scan. Resume it right after the test.  Continue Methimazole 10 mg 2x a day.  Please stop the Methimazole (Tapazole) and call us or your primary care doctor if you develop: - sore throat - fever - yellow skin - dark urine - light colored stools As we will then need to check your blood counts and liver tests.  Please come back for a follow-up appointment in 3 months, but come in 1 month for labs.

## 2013-06-16 ENCOUNTER — Ambulatory Visit: Payer: Self-pay | Admitting: Internal Medicine

## 2013-07-01 ENCOUNTER — Telehealth: Payer: Self-pay | Admitting: *Deleted

## 2013-07-01 NOTE — Telephone Encounter (Signed)
Management per PCP - please advise her to contact Dr Wilkie AyeHorton for advice.

## 2013-07-01 NOTE — Telephone Encounter (Signed)
Called pt and lvm advising her per Dr Elvera LennoxGherghe, this is outside the scope of my practice, she will need to be seen in Urgent Care to see how the abscess looks like and if she needs further tx based on clinical exam and vital signs (fever, etc.).

## 2013-07-01 NOTE — Telephone Encounter (Signed)
Pt called stating she had a reaction to the clindamycin. She was severely itching on her feet, bottoms of her feet and legs, last Friday evening. She called the pharmacy they recommended she quit taking the clindamycin. Pt wants to know if there is another medication she should take. Please advise.

## 2013-07-17 ENCOUNTER — Encounter (HOSPITAL_COMMUNITY): Admission: RE | Admit: 2013-07-17 | Payer: Self-pay | Source: Ambulatory Visit

## 2013-07-18 ENCOUNTER — Encounter (HOSPITAL_COMMUNITY): Payer: Self-pay

## 2013-07-30 ENCOUNTER — Encounter (HOSPITAL_COMMUNITY)
Admission: RE | Admit: 2013-07-30 | Discharge: 2013-07-30 | Disposition: A | Discharge status: Home / Self Care | Payer: Managed Care, Other (non HMO) | Source: Ambulatory Visit | Attending: Internal Medicine | Admitting: Internal Medicine

## 2013-07-30 DIAGNOSIS — E0500 Thyrotoxicosis with diffuse goiter without thyrotoxic crisis or storm: Secondary | ICD-10-CM | POA: Insufficient documentation

## 2013-07-30 LAB — PREGNANCY, URINE: Preg Test, Ur: NEGATIVE

## 2013-07-31 ENCOUNTER — Encounter (HOSPITAL_COMMUNITY)
Admission: RE | Admit: 2013-07-31 | Discharge: 2013-07-31 | Disposition: A | Discharge status: Home / Self Care | Payer: Managed Care, Other (non HMO) | Source: Ambulatory Visit | Attending: Internal Medicine | Admitting: Internal Medicine

## 2013-07-31 MED ORDER — SODIUM PERTECHNETATE TC 99M INJECTION
10.0000 | Freq: Once | INTRAVENOUS | Status: AC | PRN
  Administered 2013-07-31: 10 via INTRAVENOUS

## 2013-07-31 MED ORDER — SODIUM IODIDE I 131 CAPSULE
16.0000 | Freq: Once | INTRAVENOUS | Status: AC | PRN
  Administered 2013-07-31: 16 via ORAL

## 2013-08-01 ENCOUNTER — Other Ambulatory Visit (INDEPENDENT_AMBULATORY_CARE_PROVIDER_SITE_OTHER): Payer: Medicaid Other

## 2013-08-01 ENCOUNTER — Other Ambulatory Visit: Payer: Self-pay | Admitting: *Deleted

## 2013-08-01 DIAGNOSIS — E0500 Thyrotoxicosis with diffuse goiter without thyrotoxic crisis or storm: Secondary | ICD-10-CM

## 2013-08-01 LAB — TSH: TSH: 0.01 u[IU]/mL — ABNORMAL LOW (ref 0.35–5.50)

## 2013-08-01 LAB — T4, FREE: FREE T4: 2.43 ng/dL — AB (ref 0.60–1.60)

## 2013-08-01 LAB — T3, FREE: T3 FREE: 8 pg/mL — AB (ref 2.3–4.2)

## 2013-08-05 ENCOUNTER — Other Ambulatory Visit: Payer: Self-pay | Admitting: Internal Medicine

## 2013-08-05 ENCOUNTER — Telehealth: Payer: Self-pay | Admitting: Internal Medicine

## 2013-08-05 DIAGNOSIS — E0500 Thyrotoxicosis with diffuse goiter without thyrotoxic crisis or storm: Secondary | ICD-10-CM

## 2013-08-05 MED ORDER — METHIMAZOLE 10 MG PO TABS: 10.0000 mg | ORAL_TABLET | Freq: Two times a day (BID) | ORAL | Status: DC

## 2013-08-05 NOTE — Telephone Encounter (Signed)
Pt called requesting lab results. Please advise.  

## 2013-08-05 NOTE — Progress Notes (Signed)
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  She needs an appt with me 1 mo after the tx. I refilled her MMI >> will stop 4 days prior to RAI tx. Advised her not to get pregnant until labs stable after RAI tx.

## 2013-08-05 NOTE — Telephone Encounter (Signed)
I d/w her.

## 2013-08-05 NOTE — Telephone Encounter (Signed)
Pt would like her lab results please  Call back: [phone]  Thank You :)

## 2013-08-13 ENCOUNTER — Telehealth: Payer: Self-pay | Admitting: Internal Medicine

## 2013-08-13 NOTE — Telephone Encounter (Signed)
Pt called and stated that she is having eye pain and swelling  She believes this is stemming from her graves disease   Please advise patient   Call back:[phone]  Thank You :)

## 2013-08-13 NOTE — Telephone Encounter (Signed)
Returned pt's call. Advised pt that Dr Elvera LennoxGherghe is not here today. Advised pt to go to Urgent Care or to the [facility] to have it checked. Pt wanted to see if we could give her a work note. Advised pt that, only if she is seen by the physician. Pt understood.

## 2013-08-19 ENCOUNTER — Ambulatory Visit (HOSPITAL_COMMUNITY): Payer: Self-pay

## 2013-08-20 ENCOUNTER — Telehealth: Payer: Self-pay | Admitting: Internal Medicine

## 2013-08-20 NOTE — Telephone Encounter (Signed)
Patient called in stating that she is having her nuclear injection on 08/22/13 The patient says isolation is mandatory and needs hospital stay for 4 days   Dr. Elvera LennoxGherghe is the only one who can refer her, patient had called her insurance   Please advise patient   Call back: [phone]  Thank You :)

## 2013-08-21 NOTE — Telephone Encounter (Signed)
I d/w the pt about the low dose radiation given by RAI Tx >> will arrange to stay out of the house for 3 days, but no need for inpt stay.

## 2013-08-22 ENCOUNTER — Encounter (HOSPITAL_COMMUNITY)
Admission: RE | Admit: 2013-08-22 | Discharge: 2013-08-22 | Disposition: A | Discharge status: Home / Self Care | Payer: Managed Care, Other (non HMO) | Source: Ambulatory Visit | Attending: Internal Medicine | Admitting: Internal Medicine

## 2013-08-22 DIAGNOSIS — E0590 Thyrotoxicosis, unspecified without thyrotoxic crisis or storm: Secondary | ICD-10-CM | POA: Insufficient documentation

## 2013-08-22 LAB — HCG, SERUM, QUALITATIVE: PREG SERUM: NEGATIVE

## 2013-08-22 MED ORDER — SODIUM IODIDE I 131 CAPSULE
17.6000 | Freq: Once | INTRAVENOUS | Status: AC | PRN
  Administered 2013-08-22: 17.6 via ORAL

## 2013-08-28 ENCOUNTER — Encounter: Payer: Self-pay | Admitting: Internal Medicine

## 2013-09-05 ENCOUNTER — Telehealth: Payer: Self-pay | Admitting: Internal Medicine

## 2013-09-05 NOTE — Telephone Encounter (Signed)
Please read note below and advise.  

## 2013-09-05 NOTE — Telephone Encounter (Signed)
If it is only red, she should follow it and rest her eyes. If she has changes in vision, she should go to Urgent care.

## 2013-09-05 NOTE — Telephone Encounter (Signed)
Pt states that she woke up this morning with her (L) eye blood shot, broken blood vessels  She is nervous due to Graves Disease   Please advise patient   Thank You :)

## 2013-09-05 NOTE — Telephone Encounter (Signed)
Called pt and advised her per Dr Gherghe's note. Pt understood.  

## 2013-09-08 ENCOUNTER — Ambulatory Visit: Payer: Self-pay | Admitting: Internal Medicine

## 2013-09-09 ENCOUNTER — Encounter: Payer: Self-pay | Admitting: Internal Medicine

## 2013-09-09 ENCOUNTER — Encounter (INDEPENDENT_AMBULATORY_CARE_PROVIDER_SITE_OTHER): Payer: Self-pay

## 2013-09-09 ENCOUNTER — Ambulatory Visit (INDEPENDENT_AMBULATORY_CARE_PROVIDER_SITE_OTHER): Payer: Managed Care, Other (non HMO) | Admitting: Internal Medicine

## 2013-09-09 VITALS — BP 114/64 | HR 105 | Temp 98.5°F | Resp 12 | Wt 156.0 lb

## 2013-09-09 DIAGNOSIS — E0500 Thyrotoxicosis with diffuse goiter without thyrotoxic crisis or storm: Secondary | ICD-10-CM

## 2013-09-09 LAB — T4, FREE: FREE T4: 3.65 ng/dL — AB (ref 0.60–1.60)

## 2013-09-09 LAB — TSH: TSH: 0 u[IU]/mL — ABNORMAL LOW (ref 0.35–5.50)

## 2013-09-09 LAB — T3, FREE: T3 FREE: 14.5 pg/mL — AB (ref 2.3–4.2)

## 2013-09-09 MED ORDER — ATENOLOL 25 MG PO TABS: 25.0000 mg | ORAL_TABLET | Freq: Two times a day (BID) | ORAL | Status: DC

## 2013-09-09 NOTE — Patient Instructions (Addendum)
Please return in 4 weeks for labs. Please return in 3 months for a visit   You will be contacted about the eye dr. appointment.

## 2013-09-09 NOTE — Progress Notes (Signed)
Patient ID: [name] Curry, female   DOB: 1980/10/24, 33 y.o.   MRN: [id number]   HPI  [name] Curry is a 33 y.o.-year-old female, referred by her PCP, Dr. 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  Reviewed hx: In 05/2013, she was seen in the ED for a peritonsillar abscess recently and was also found to have a goiter and thyrotoxicosis. Pt was started on MMI 10 mg bid and referred to me. 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 She stopped MMI 2 weeks prior to RAI tx, on 08/22/2013.  I reviewed pt's thyroid tests: Lab Results  Component Value Date   TSH 0.01* 08/01/2013   TSH <0.008* 06/03/2013   FREET4 2.43* 08/01/2013   FREET4 4.07* 06/03/2013    She tells me she feels worse after the RAI tx (could be as she stopped MMI 2 weeks prior to RAI tx rather than 4 days  - she had a reaction: itching, rash, sweating, but this might have been from Clindamycin that she was then taking, too for a tonsillar abscess.  Pt c/o: - ++ excessive sweating/heat intolerance - ++ tremors - ++ anxiety - resolved palpitations - + problems with concentration - ++ fatigue - no hyperdefecation, but constipation - frequent urination - + weight loss: 2013: 165 >> 127 lbs (unintentional), now 156 lbs.  Pt is feeling nodules in neck, no hoarseness, + mild dysphagia/odynophagia, SOB with lying down.  I reviewed pt's medications, allergies, PMH, social hx, family hx and no changes required, except as mentioned above.  ROS: Constitutional:see above, + excessive urination Eyes: + blurry vision, + xerophthalmia ENT:+ sore throat, no nodules palpated in throat, + dysphagia/no odynophagia, no hoarseness Cardiovascular: no CP/SOB/palpitations/ leg swelling Respiratory: no cough/SOB/wheezing Gastrointestinal: no N/V/D/+ C, +noheartburn Musculoskeletal: + muscle aches/no  joint aches Skin: no rash, + itching Neurological:+ tremors/no  numbness/tingling/dizziness, + HA  PE: BP 114/64  Pulse 105  Temp(Src) 98.5 F (36.9 C) (Oral)  Resp 12  Wt 156 lb (70.761 kg)  SpO2 98%  LMP 08/03/2013 Wt Readings from Last 3 Encounters:  09/09/13 156 lb (70.761 kg)  06/10/13 149 lb (67.586 kg)  08/04/11 139 lb (63.05 kg)   Constitutional: normal weight, in NAD Eyes: PERRLA, EOMI, + mild exophthalmos (bilateral, 22 mm on exophthalmometer - at 100 mm), no lid lag, no stare ENT: moist mucous membranes, nontender thyromegaly, no thyroid bruits, no cervical lymphadenopathy Cardiovascular: RRR, No MRG Respiratory: CTA B Gastrointestinal: abdomen soft, NT, ND, BS+ Musculoskeletal: no deformities, strength intact in all 4 Skin: moist, warm, no rashes Neurological: + tremor with outstretched hands, DTR normal in all 4  ASSESSMENT: 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  Thyromegaly is noted, with 19 mm AP isthmus and substernal extent. 8 mm left thyroid nodule, with additional smaller nor nodules noted.  Prominent jugulodigastric lymph nodes, without pathologic features.  Apparent intimal thickening of the left internal carotid artery.   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  - 07/31/2013: Thyroid Uptake and scan:  4 hr radio iodine uptake is calculated at 46%, above the normal range consistent with hyperthyroidism. Images in 3 projections demonstrate diffuse enlargement of thyroid lobes bilaterally. Multiple small areas of slightly increased in decreased tracer localization identified suggesting diffuse small  nodules. No dominant cold or hot nodule identified.  - 08/22/2013: RAI Tx 17.6 mCi.  She was off MMI x 2 weeks prior to the Uptake and scan  PLAN:  1. 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 - I advised her to join my chart to communicate easier - RTC in 3 months, but in 1 mo for repeat labs  Office Visit on 09/09/2013  Component Date Value Ref Range Status  . TSH 09/09/2013 0.00 Repeated and verified X2.* 0.35 - 5.50 uIU/mL Final  . Free T4 09/09/2013 3.65* 0.60 - 1.60 ng/dL Final  . T3, Free 40/10/272504/21/2015 14.5* 2.3 - 4.2 pg/mL Final   Start atenolol 25 mg bid.

## 2013-09-11 ENCOUNTER — Other Ambulatory Visit: Payer: Self-pay | Admitting: *Deleted

## 2013-09-11 MED ORDER — METHIMAZOLE 10 MG PO TABS: 10.0000 mg | ORAL_TABLET | Freq: Two times a day (BID) | ORAL | Status: DC

## 2013-09-11 NOTE — Telephone Encounter (Signed)
Called pt and advised her of her lab results; TFT's worse, as expected. Please start Atenolol 25 mg bid (Dr Elvera LennoxGherghe sent to your pharmcay). This will help with your sxs. Return in 1 mo for labs. Pt requested refill of her methimazole. Done. Pt understood.

## 2013-09-11 NOTE — Telephone Encounter (Signed)
Pt returning call

## 2013-09-22 ENCOUNTER — Telehealth: Payer: Self-pay | Admitting: *Deleted

## 2013-09-22 ENCOUNTER — Other Ambulatory Visit: Payer: Self-pay | Admitting: Internal Medicine

## 2013-09-22 ENCOUNTER — Ambulatory Visit: Payer: Self-pay

## 2013-09-22 DIAGNOSIS — E0500 Thyrotoxicosis with diffuse goiter without thyrotoxic crisis or storm: Secondary | ICD-10-CM

## 2013-09-22 NOTE — Telephone Encounter (Signed)
I called and d/w her: Apparently she started again MMI after last set of labs, but I only wanted her to start Atenolol 25 mg bid. I advised her to stop the MMI now and come back for labs in 1 mo.

## 2013-10-23 ENCOUNTER — Other Ambulatory Visit (INDEPENDENT_AMBULATORY_CARE_PROVIDER_SITE_OTHER): Payer: Managed Care, Other (non HMO)

## 2013-10-23 DIAGNOSIS — E0500 Thyrotoxicosis with diffuse goiter without thyrotoxic crisis or storm: Secondary | ICD-10-CM

## 2013-10-23 LAB — T4, FREE: Free T4: 1.99 ng/dL — ABNORMAL HIGH (ref 0.60–1.60)

## 2013-10-23 LAB — TSH: TSH: 0 u[IU]/mL — ABNORMAL LOW (ref 0.35–4.50)

## 2013-10-23 LAB — T3, FREE: T3, Free: 5 pg/mL — ABNORMAL HIGH (ref 2.3–4.2)

## 2013-10-24 ENCOUNTER — Encounter: Payer: Self-pay | Admitting: Internal Medicine

## 2013-10-24 ENCOUNTER — Telehealth: Payer: Self-pay | Admitting: Internal Medicine

## 2013-10-24 ENCOUNTER — Other Ambulatory Visit: Payer: Self-pay | Admitting: Internal Medicine

## 2013-10-24 DIAGNOSIS — E0500 Thyrotoxicosis with diffuse goiter without thyrotoxic crisis or storm: Secondary | ICD-10-CM

## 2013-10-24 NOTE — Telephone Encounter (Signed)
Please read note below and advise.  

## 2013-10-24 NOTE — Telephone Encounter (Signed)
Labs are better! In the note that I sent her, by mistake, I told her to continue Methimazole, but should have been Atenolol, I apologize. I think this is the reason for the confusion. I will send her a new msg through MyChart - but please call ehr as well.

## 2013-10-24 NOTE — Telephone Encounter (Signed)
Patient called this morning stating she was at our office on 10/23/13 for labs  She looked on Mychart and she does not understand the results  Please advise patient   Thank You

## 2013-10-27 NOTE — Telephone Encounter (Signed)
Called pt and lvm

## 2013-11-28 ENCOUNTER — Other Ambulatory Visit (INDEPENDENT_AMBULATORY_CARE_PROVIDER_SITE_OTHER): Payer: Managed Care, Other (non HMO)

## 2013-11-28 ENCOUNTER — Telehealth: Payer: Self-pay | Admitting: Internal Medicine

## 2013-11-28 DIAGNOSIS — E0500 Thyrotoxicosis with diffuse goiter without thyrotoxic crisis or storm: Secondary | ICD-10-CM

## 2013-11-28 LAB — T4, FREE: Free T4: 0.41 ng/dL — ABNORMAL LOW (ref 0.60–1.60)

## 2013-11-28 LAB — TSH: TSH: 0.02 u[IU]/mL — AB (ref 0.35–4.50)

## 2013-11-28 LAB — T3, FREE: T3, Free: 2 pg/mL — ABNORMAL LOW (ref 2.3–4.2)

## 2013-11-28 NOTE — Telephone Encounter (Signed)
Returned pt's call and she stated that she has had these body cramps for approx. 2 mos. They have become progressively worse. They last about 5 to 10 minutes. When it occurs, e.g. Her arms become weak and she is unable to hold or pull anything close to her body. She is unsure if this is due to the medication or another problem. Please advise.

## 2013-11-28 NOTE — Telephone Encounter (Signed)
Called pt and advised her per Dr Charlean SanfilippoGherghe's note. Pt to come today to have labs done. Be advised.

## 2013-11-28 NOTE — Telephone Encounter (Signed)
Patient called this morning wanting to get some information  She does not know if her medication could be causing severe body cramps  Please advise patient  Call back: [phone]  Thank You

## 2013-11-28 NOTE — Telephone Encounter (Signed)
Unlikely to be from Atenolol, but she can taper it down to off now. We will need to check thyroid tests >> labs are in , lets have her come to the lab.

## 2013-12-01 ENCOUNTER — Other Ambulatory Visit: Payer: Self-pay | Admitting: Internal Medicine

## 2013-12-01 DIAGNOSIS — E890 Postprocedural hypothyroidism: Secondary | ICD-10-CM | POA: Insufficient documentation

## 2013-12-01 MED ORDER — LEVOTHYROXINE SODIUM 75 MCG PO TABS: 75.0000 ug | ORAL_TABLET | Freq: Every day | ORAL | Status: DC

## 2014-01-13 ENCOUNTER — Telehealth: Payer: Self-pay | Admitting: Internal Medicine

## 2014-01-13 NOTE — Telephone Encounter (Signed)
Patient stated that she was suppose to take her Levothyroxine  In the morning but instead she took her Birth control instead, and now she has to wait four hours to eat until she take her Levothyroxine because she has to take it on and empty stomach. And she is afraid, because she mixed it up.. Please advise.

## 2014-01-13 NOTE — Telephone Encounter (Signed)
Called pt and advised her ok to take pill. Pt understood.

## 2014-02-03 ENCOUNTER — Ambulatory Visit (INDEPENDENT_AMBULATORY_CARE_PROVIDER_SITE_OTHER): Payer: Managed Care, Other (non HMO) | Admitting: Internal Medicine

## 2014-02-03 ENCOUNTER — Encounter: Payer: Self-pay | Admitting: Internal Medicine

## 2014-02-03 VITALS — BP 110/68 | HR 101 | Temp 97.7°F | Resp 12 | Wt 172.0 lb

## 2014-02-03 DIAGNOSIS — E0500 Thyrotoxicosis with diffuse goiter without thyrotoxic crisis or storm: Secondary | ICD-10-CM

## 2014-02-03 DIAGNOSIS — E890 Postprocedural hypothyroidism: Secondary | ICD-10-CM

## 2014-02-03 LAB — TSH: TSH: 0.02 u[IU]/mL — AB (ref 0.35–4.50)

## 2014-02-03 LAB — T3, FREE: T3 FREE: 3.7 pg/mL (ref 2.3–4.2)

## 2014-02-03 LAB — T4, FREE: FREE T4: 1.31 ng/dL (ref 0.60–1.60)

## 2014-02-03 NOTE — Patient Instructions (Signed)
Please stop at the lab. Continue the Levothyroxine 75 mcg daily in am. Please come back for a follow-up appointment in 3 months

## 2014-02-03 NOTE — Progress Notes (Signed)
Patient ID: [name] Curry, female   DOB: 06-07-1980, 33 y.o.   MRN: [id number]   HPI  [name] Curry is a 33 y.o.-year-old female, initially referred by Dr. Ross Curry, returning for f/u for h/o Graves ds, now with postablative hypothyroidism. Last visit 4.5 mo ago.  Reviewed hx: In 05/2013, she was seen in the ED for a peritonsillar abscess recently and was also found to have a goiter and thyrotoxicosis. Pt was started on MMI 10 mg bid and referred to me. 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 She stopped MMI 2 weeks prior to RAI tx, on 08/22/2013.  I reviewed pt's thyroid tests: Lab Results  Component Value Date   TSH 0.02* 11/28/2013   TSH 0.00* 10/23/2013   TSH 0.00 Repeated and verified X2.* 09/09/2013   TSH 0.01* 08/01/2013   TSH <0.008* 06/03/2013   FREET4 0.41* 11/28/2013   FREET4 1.99* 10/23/2013   FREET4 3.65* 09/09/2013   FREET4 2.43* 08/01/2013   FREET4 4.07* 06/03/2013    She had RAI tx in 08/2013. She became hypothyroid afterwards >> started Levothyroxine 75 mcg daily. Was on Atenolol 25 mg daily >> stopped b/c mm cramps.  She takes the LT4: - fasting - with water - separated from b'fast by 30-60 min - no calcium, no iron, MVI, PPI  Pt c/o: - + still excessive sweating/heat intolerance - no more tremors - + anxiety - resolved palpitations  - + mm cramps - mostly in abdomen, but most mm (feel like "charlie horses"). Now less intense, but daily. When she was on Atenolol, they were worse, but more spaced apart. - + fatigue - no hyperdefecation or constipation - + weight gain  Pt is feeling nodules in neck, no hoarseness, + dysphagia/odynophagia, no SOB with lying down.  I reviewed pt's medications, allergies, PMH, social hx, family hx and no changes required, except as mentioned above.  ROS: Constitutional:see above, + nocturia Eyes: no blurry vision, no xerophthalmia ENT:+ sore throat, no nodules palpated  in throat, + dysphagia/no odynophagia, no hoarseness Cardiovascular: no CP/no SOB/occas. palpitations/ leg swelling Respiratory: no cough/+ SOB/no wheezing Gastrointestinal: + N/no V/D/C, + heartburn Musculoskeletal: + muscle cramps/no  joint aches Skin: no rash, + itching Neurological:+ tremors/no numbness/tingling/dizziness, + HA + low libido  PE: BP 110/68  Pulse 101  Temp(Src) 97.7 F (36.5 C) (Oral)  Resp 12  Wt 172 lb (78.019 kg)  SpO2 96% Wt Readings from Last 3 Encounters:  02/03/14 172 lb (78.019 kg)  09/09/13 156 lb (70.761 kg)  06/10/13 149 lb (67.586 kg)   Constitutional: overweight, in NAD Eyes: PERRLA, EOMI, + mild exophthalmos, no lid lag, no stare ENT: moist mucous membranes, nontender mild thyromegaly, no thyroid bruits, no cervical lymphadenopathy Cardiovascular: RRR, No MRG Respiratory: CTA B Gastrointestinal: abdomen soft, NT, ND, BS+ Musculoskeletal: no deformities, strength intact in all 4 Skin: moist, warm, no rashes Neurological: no tremor with outstretched hands, DTR normal in all 4  ASSESSMENT: 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  Thyromegaly is noted, with 19 mm AP isthmus and substernal extent. 8 mm left thyroid nodule, with additional smaller nor nodules noted.  Prominent jugulodigastric lymph nodes, without pathologic features.  Apparent intimal thickening of the left internal carotid artery.   Included view of the chest demonstrates a hypoenhancing lobulated 42 x  18 mm mass, unchanged (likely thymus enlarged).  - 07/31/2013: Thyroid Uptake and scan:  4 hr radio iodine uptake is calculated at 46%, above the normal range consistent with hyperthyroidism. Images in 3 projections demonstrate diffuse enlargement of thyroid lobes bilaterally. Multiple small areas of slightly increased in decreased tracer localization identified  suggesting diffuse small nodules. No dominant cold or hot nodule identified.  - 08/22/2013: RAI Tx 17.6 mCi.  She was off MMI x 2 weeks prior to the Uptake and scan  2. Postablative hypothyroidism - On LT4 75 mcg daily  PLAN:  1. And 2. 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 She has some discomfort swallowing, we may need to check a thyroid U/S sooner >> she will let me know if this does not get better in the next 2 weeks. I cannot feel nodularity on palpation. - will check the TSH, fT4 today - she is taking Levothyroxine correctly - RTC in 3 months, but in likely sooner for repeat labs  Component     Latest Ref Rng 02/03/2014  TSH     0.35 - 4.50 uIU/mL 0.02 (L)  Free T4     0.60 - 1.60 ng/dL 1.61  T3, Free     2.3 - 4.2 pg/mL 3.7   TSH suppressed, however, fT4 and fT3 normal. Will continue same LT4 dose (75 mcg) for now but recheck labs in 6 weeks and if TSH still suppressed, amy need decrease in dose.

## 2014-03-08 ENCOUNTER — Emergency Department (HOSPITAL_COMMUNITY): Payer: Managed Care, Other (non HMO)

## 2014-03-08 ENCOUNTER — Emergency Department (HOSPITAL_COMMUNITY)
Admission: EM | Admit: 2014-03-08 | Discharge: 2014-03-08 | Disposition: A | Discharge status: Home / Self Care | Payer: Managed Care, Other (non HMO) | Attending: Emergency Medicine | Admitting: Emergency Medicine

## 2014-03-08 ENCOUNTER — Encounter (HOSPITAL_COMMUNITY): Payer: Self-pay | Admitting: Emergency Medicine

## 2014-03-08 DIAGNOSIS — Z880 Allergy status to penicillin: Secondary | ICD-10-CM | POA: Diagnosis not present

## 2014-03-08 DIAGNOSIS — R079 Chest pain, unspecified: Secondary | ICD-10-CM

## 2014-03-08 DIAGNOSIS — E079 Disorder of thyroid, unspecified: Secondary | ICD-10-CM | POA: Insufficient documentation

## 2014-03-08 DIAGNOSIS — Z720 Tobacco use: Secondary | ICD-10-CM | POA: Diagnosis not present

## 2014-03-08 DIAGNOSIS — Z79899 Other long term (current) drug therapy: Secondary | ICD-10-CM | POA: Diagnosis not present

## 2014-03-08 DIAGNOSIS — Z791 Long term (current) use of non-steroidal anti-inflammatories (NSAID): Secondary | ICD-10-CM | POA: Insufficient documentation

## 2014-03-08 DIAGNOSIS — N63 Unspecified lump in unspecified breast: Secondary | ICD-10-CM

## 2014-03-08 DIAGNOSIS — J45909 Unspecified asthma, uncomplicated: Secondary | ICD-10-CM | POA: Diagnosis not present

## 2014-03-08 DIAGNOSIS — Z3202 Encounter for pregnancy test, result negative: Secondary | ICD-10-CM | POA: Diagnosis not present

## 2014-03-08 DIAGNOSIS — Z8659 Personal history of other mental and behavioral disorders: Secondary | ICD-10-CM | POA: Diagnosis not present

## 2014-03-08 DIAGNOSIS — Z862 Personal history of diseases of the blood and blood-forming organs and certain disorders involving the immune mechanism: Secondary | ICD-10-CM | POA: Insufficient documentation

## 2014-03-08 DIAGNOSIS — R0789 Other chest pain: Secondary | ICD-10-CM | POA: Insufficient documentation

## 2014-03-08 LAB — CBC
HCT: 38.7 % (ref 36.0–46.0)
Hemoglobin: 12.8 g/dL (ref 12.0–15.0)
MCH: 25.1 pg — AB (ref 26.0–34.0)
MCHC: 33.1 g/dL (ref 30.0–36.0)
MCV: 75.9 fL — ABNORMAL LOW (ref 78.0–100.0)
PLATELETS: 301 10*3/uL (ref 150–400)
RBC: 5.1 MIL/uL (ref 3.87–5.11)
RDW: 13.7 % (ref 11.5–15.5)
WBC: 6.2 10*3/uL (ref 4.0–10.5)

## 2014-03-08 LAB — BASIC METABOLIC PANEL
Anion gap: 10 (ref 5–15)
BUN: 9 mg/dL (ref 6–23)
CO2: 24 mEq/L (ref 19–32)
Calcium: 9 mg/dL (ref 8.4–10.5)
Chloride: 101 mEq/L (ref 96–112)
Creatinine, Ser: 0.75 mg/dL (ref 0.50–1.10)
Glucose, Bld: 97 mg/dL (ref 70–99)
Potassium: 4 mEq/L (ref 3.7–5.3)
SODIUM: 135 meq/L — AB (ref 137–147)

## 2014-03-08 LAB — T4, FREE: Free T4: 2.22 ng/dL — ABNORMAL HIGH (ref 0.80–1.80)

## 2014-03-08 LAB — I-STAT TROPONIN, ED: TROPONIN I, POC: 0 ng/mL (ref 0.00–0.08)

## 2014-03-08 LAB — POC URINE PREG, ED: Preg Test, Ur: NEGATIVE

## 2014-03-08 LAB — TSH: TSH: <0.005 u[IU]/mL — ABNORMAL LOW (ref 0.350–4.500)

## 2014-03-08 MED ORDER — KETOROLAC TROMETHAMINE 30 MG/ML IJ SOLN
30.0000 mg | Freq: Once | INTRAMUSCULAR | Status: AC
Start: 2014-03-08 — End: 2014-03-08
  Administered 2014-03-08: 30 mg via INTRAVENOUS
  Filled 2014-03-08: qty 1

## 2014-03-08 MED ORDER — GI COCKTAIL ~~LOC~~
30.0000 mL | Freq: Once | ORAL | Status: AC
  Administered 2014-03-08: 30 mL via ORAL
  Filled 2014-03-08: qty 30

## 2014-03-08 MED ORDER — IBUPROFEN 800 MG PO TABS: 800.0000 mg | ORAL_TABLET | Freq: Three times a day (TID) | ORAL | Status: AC

## 2014-03-08 NOTE — ED Notes (Addendum)
Pt reports left sided chest pain x3 months, difficulty taking full breath x1.5 months. Reports she was doing a breast exam and found a lump on the bottom part of her left breast. Chest and lump pain 8/10. Reports cousins and grandma have had breast cancer.  Reports she has thyroid disease, was hyperthyroid, had radiation in April 2015, now is hypothyroid. Has gained 40 pounds in last 4 months, endocrinologist says this is a good thing. Reports she has been on birth control for 5 months. Reports some nausea in the mornings x6 months.

## 2014-03-08 NOTE — ED Notes (Signed)
Unable to obtain blood RN made aware

## 2014-03-08 NOTE — ED Provider Notes (Signed)
CSN: [id number]     Arrival date & time 03/08/14  [id number] History   First MD Initiated Contact with Patient 03/08/14 [phone]     Chief Complaint  Patient presents with  . Chest Pain  . breast lump       HPI  Patient presents with multiple concerns. She notes over the past 3 months she has had ongoing chest pain. Pain is sternal, left sternal, sore, severe, worse with prone or supine positioning, better with upright positioning. There is no associated dyspnea, lightheadedness, syncope. Over the past days patient has also noticed a lump in the left inferior medial breast crease. No new fever, fatigue, weight loss, weight gain. Patient has history of thyroid disorder, no recent medication changes. The patient has a family history of breast cancer, concern to her.   Past Medical History  Diagnosis Date  . Sickle cell trait   . Asthma   . Anemia   . Anxiety   . Thyroid disease     was hyperthyroid, had radiaton in April 2015, now hyperthyroid   Past Surgical History  Procedure Laterality Date  . Hemorrhoid surgery    . Colposcopy     Family History  Problem Relation Age of Onset  . Diabetes Other   . Hyperlipidemia Father    History  Substance Use Topics  . Smoking status: Current Every Day Smoker -- 0.50 packs/day for 18 years    Types: Cigars, Cigarettes  . Smokeless tobacco: Not on file  . Alcohol Use: Yes     Comment:  a beer once a week   OB History   Grav Para Term Preterm Abortions TAB SAB Ect Mult Living                 Review of Systems  Constitutional:       Per HPI, otherwise negative  HENT:       Per HPI, otherwise negative  Respiratory:       Per HPI, otherwise negative  Cardiovascular:       Per HPI, otherwise negative  Gastrointestinal: Negative for vomiting.  Endocrine:       Negative aside from HPI  Genitourinary:       Neg aside from HPI   Musculoskeletal:       Per HPI, otherwise negative  Skin: Negative.   Neurological: Negative for  syncope.      Allergies  Atenolol; Methimazole; Penicillins; and Sulfa antibiotics  Home Medications   Prior to Admission medications   Medication Sig Start Date End Date Taking? Authorizing Provider  atenolol (TENORMIN) 25 MG tablet Take 1 tablet (25 mg total) by mouth 2 (two) times daily. 09/09/13   Carlus Pavlovristina Gherghe, MD  diphenhydrAMINE (BENADRYL) 25 MG tablet Take 25 mg by mouth every 6 (six) hours as needed.    Historical Provider, MD  HYDROcodone-acetaminophen (NORCO/VICODIN) 5-325 MG per tablet Take 1 tablet by mouth every 6 (six) hours as needed. 06/05/13   Shon Batonourtney F Horton, MD  levothyroxine (SYNTHROID, LEVOTHROID) 75 MCG tablet Take 1 tablet (75 mcg total) by mouth daily. 12/01/13   Carlus Pavlovristina Gherghe, MD  MICROGESTIN FE 1/20 1-20 MG-MCG tablet  09/04/13   Historical Provider, MD  naphazoline-glycerin (CLEAR EYES) 0.012-0.2 % SOLN Place 1-2 drops into both eyes every 4 (four) hours as needed for irritation.    Historical Provider, MD  naproxen sodium (ANAPROX) 220 MG tablet Take 440 mg by mouth 2 (two) times daily with a meal.    Historical Provider, MD  oxyCODONE-acetaminophen (PERCOCET/ROXICET) 5-325 MG per tablet Take 1 tablet by mouth every 8 (eight) hours as needed for severe pain. 06/03/13   Marissa Sciacca, PA-C   BP 122/75  Pulse 81  Temp(Src) 98.2 F (36.8 C) (Oral)  Resp 16  Wt 172 lb 8 oz (78.245 kg)  SpO2 100%  LMP 03/08/2014 Physical Exam  Nursing note and vitals reviewed. Constitutional: She is oriented to person, place, and time. She appears well-developed and well-nourished. No distress.  HENT:  Head: Normocephalic and atraumatic.  Eyes: Conjunctivae and EOM are normal.  Cardiovascular: Normal rate and regular rhythm.   Pulmonary/Chest: Effort normal and breath sounds normal. No stridor. No respiratory distress.    Abdominal: She exhibits no distension.  Musculoskeletal: She exhibits no edema.  Neurological: She is alert and oriented to person, place, and  time. No cranial nerve deficit.  Skin: Skin is warm and dry.  Psychiatric: She has a normal mood and affect.    ED Course  Procedures (including critical care time) Labs Review Labs Reviewed  CBC - Abnormal; Notable for the following:    MCV 75.9 (*)    MCH 25.1 (*)    All other components within normal limits  BASIC METABOLIC PANEL - Abnormal; Notable for the following:    Sodium 135 (*)    All other components within normal limits  T4, FREE  TSH  I-STAT TROPOININ, ED  POC URINE PREG, ED    Imaging Review Dg Chest 2 View  03/08/2014   CLINICAL DATA:  Chest pain and shortness of breath 2 months. Left breast lump 2 days.  EXAM: CHEST  2 VIEW  COMPARISON:  06/03/2013  FINDINGS: Lungs are clear. Cardiomediastinal silhouette and remainder the exam is unchanged.  IMPRESSION: No active cardiopulmonary disease.   Electronically Signed   By: Elberta Fortisaniel  Boyle M.D.   On: 03/08/2014 10:56     EKG Interpretation   Date/Time:  "Sunday March 08 2014 08:45:04 EDT Ventricular Rate:  82 PR Interval:  134 QRS Duration: 87 QT Interval:  350 QTC Calculation: 409 R Axis:   86 Text Interpretation:  Sinus rhythm RSR' in V1 or V2, right VCD or RVH  Baseline wander in lead(s) II III aVL aVF V4 V5 Sinus rhythm RSR' pattern  in V1 Artifact Abnormal ekg Confirmed by Connery Shiffler  MD ([id number]) on  03/08/2014 8:48:18 AM     11" :45 AM On repeat exam the patient is resting comfortably.  She states that she felt better after the injected parietal. She will follow up with primary care, breast clinic for outpatient ultrasound.  MDM  Patient presents with chest pain.  The patient also has a concern for new possible mass in the left medial inferior breast area.  No evidence for cutaneous infection, abscess, ACS, pneumonia. Patient improved with Toradol suggesting inflammatory condition with possibly with chest wall involvement. Patient discharged with anti-inflammatories, primary care  followup.    Gerhard Munchobert Markiya Keefe, MD 03/08/14 1146

## 2014-03-08 NOTE — Discharge Instructions (Signed)
As discussed, your evaluation today has been largely reassuring.  But, it is important that you monitor your condition carefully, and do not hesitate to return to the ED if you develop new, or concerning changes in your condition.  Otherwise, please follow-up with your physician for appropriate ongoing care.  In addition, please follow up with our affiliated breast center for outpatient ultrasound.

## 2014-03-10 ENCOUNTER — Encounter: Payer: Self-pay | Admitting: Internal Medicine

## 2014-03-10 ENCOUNTER — Other Ambulatory Visit: Payer: Self-pay | Admitting: Internal Medicine

## 2014-03-10 DIAGNOSIS — Z7689 Persons encountering health services in other specified circumstances: Secondary | ICD-10-CM

## 2014-04-21 ENCOUNTER — Encounter: Payer: Self-pay | Admitting: Internal Medicine

## 2014-04-21 ENCOUNTER — Ambulatory Visit (INDEPENDENT_AMBULATORY_CARE_PROVIDER_SITE_OTHER): Payer: Managed Care, Other (non HMO) | Admitting: Internal Medicine

## 2014-04-21 ENCOUNTER — Other Ambulatory Visit (INDEPENDENT_AMBULATORY_CARE_PROVIDER_SITE_OTHER): Payer: Managed Care, Other (non HMO)

## 2014-04-21 VITALS — BP 110/70 | HR 75 | Temp 98.5°F | Resp 16 | Ht 64.0 in | Wt 171.0 lb

## 2014-04-21 DIAGNOSIS — N63 Unspecified lump in unspecified breast: Secondary | ICD-10-CM | POA: Insufficient documentation

## 2014-04-21 DIAGNOSIS — Z0000 Encounter for general adult medical examination without abnormal findings: Secondary | ICD-10-CM

## 2014-04-21 DIAGNOSIS — E890 Postprocedural hypothyroidism: Secondary | ICD-10-CM

## 2014-04-21 DIAGNOSIS — Z23 Encounter for immunization: Secondary | ICD-10-CM

## 2014-04-21 DIAGNOSIS — F17200 Nicotine dependence, unspecified, uncomplicated: Secondary | ICD-10-CM | POA: Insufficient documentation

## 2014-04-21 DIAGNOSIS — Z720 Tobacco use: Secondary | ICD-10-CM | POA: Insufficient documentation

## 2014-04-21 LAB — COMPREHENSIVE METABOLIC PANEL
ALT: 18 U/L (ref 0–35)
AST: 18 U/L (ref 0–37)
Albumin: 4 g/dL (ref 3.5–5.2)
Alkaline Phosphatase: 96 U/L (ref 39–117)
BILIRUBIN TOTAL: 0.3 mg/dL (ref 0.2–1.2)
BUN: 9 mg/dL (ref 6–23)
CO2: 22 mEq/L (ref 19–32)
Calcium: 9.2 mg/dL (ref 8.4–10.5)
Chloride: 107 mEq/L (ref 96–112)
Creatinine, Ser: 0.8 mg/dL (ref 0.4–1.2)
GFR: 110.77 mL/min (ref >60.00)
Glucose, Bld: 77 mg/dL (ref 70–99)
Potassium: 4.3 mEq/L (ref 3.5–5.1)
Sodium: 141 mEq/L (ref 135–145)
Total Protein: 7.6 g/dL (ref 6.0–8.3)

## 2014-04-21 LAB — LIPID PANEL
Cholesterol: 198 mg/dL (ref 0–200)
HDL: 37 mg/dL — ABNORMAL LOW (ref >39.00)
LDL Cholesterol: 141 mg/dL — ABNORMAL HIGH (ref 0–99)
NONHDL: 161
TRIGLYCERIDES: 102 mg/dL (ref 0.0–149.0)
Total CHOL/HDL Ratio: 5
VLDL: 20.4 mg/dL (ref 0.0–40.0)

## 2014-04-21 LAB — CBC
HCT: 41.1 % (ref 36.0–46.0)
HEMOGLOBIN: 13.6 g/dL (ref 12.0–15.0)
MCHC: 33 g/dL (ref 30.0–36.0)
MCV: 74.5 fl — AB (ref 78.0–100.0)
PLATELETS: 289 10*3/uL (ref 150.0–400.0)
RBC: 5.52 Mil/uL — ABNORMAL HIGH (ref 3.87–5.11)
RDW: 14.1 % (ref 11.5–15.5)
WBC: 7.2 10*3/uL (ref 4.0–10.5)

## 2014-04-21 MED ORDER — PRENATABS FA PO TABS: 1.0000 | ORAL_TABLET | Freq: Every day | ORAL | Status: DC

## 2014-04-21 NOTE — Progress Notes (Signed)
Pre visit review using our clinic review tool, if applicable. No additional management support is needed unless otherwise documented below in the visit note. 

## 2014-04-21 NOTE — Assessment & Plan Note (Signed)
Last T4 normal and spoke with her about the fact that she needs to let me or her endocrinologist know if she is pregnant as her dosage would likely be increased.

## 2014-04-21 NOTE — Assessment & Plan Note (Signed)
Spoke with patient about the fact that it would be very worthwhile to quit smoking prior to conception. She is currently smoking one half pack per day and states that she will try to cut back but she is not sure how successful she will be.

## 2014-04-21 NOTE — Assessment & Plan Note (Signed)
Breast lump is currently resolved (was at base left breast) but given family history of breast cancer will order mammogram.

## 2014-04-21 NOTE — Patient Instructions (Signed)
We will send in a prenatal vitamin for you to take. Take 1 pill daily. As long as you are at least 6 months out from your radioactive iodine treatment you are able to try to conceive. It will be very important to let your endocrinologist know if you do become pregnant as well as letting your OB/GYN know about your medical problems.  We will check some blood work today and call you back with the results.  We will see you back next year unless you have any problems or questions please feel free to call our office sooner.  Preparing for Pregnancy Before trying to become pregnant, make an appointment with your health care provider (preconception care). The goal is to help you have a healthy, safe pregnancy. At your first appointment, your health care provider will:   Do a complete physical exam, including a Pap test.  Take a complete medical history.  Give you advice and help you resolve any problems. PRECONCEPTION CHECKLIST Here is a list of the basics to cover with your health care provider at your preconception visit:  Medical history.  Tell your health care provider about any diseases you have had. Many diseases can affect your pregnancy.  Include your partner's medical history and family history.  Make sure you have been tested for sexually transmitted infections (STIs). These can affect your pregnancy. In some cases, they can be passed to your baby. Tell your health care provider about any history of STIs.  Make sure your health care provider knows about any previous problems you have had with conception or pregnancy.  Tell your health care provider about any medicine you take. This includes herbal supplements and over-the-counter medicines.  Make sure all your immunizations are up to date. You may need to make additional appointments.  Ask your health care provider if you need any vaccinations or if there are any you should avoid.  Diet.  It is especially important to eat a  healthy, balanced diet with the right nutrients when you are pregnant.  Ask your health care provider to help you get to a healthy weight before pregnancy.  If you are overweight, you are at higher risk for certain complications. These include high blood pressure, diabetes, and preterm birth.  If you are underweight, you are more likely to have a low-birth-weight baby.  Lifestyle.  Tell your health care provider about lifestyle factors such as alcohol use, drug use, or smoking.  Describe any harmful substances you may be exposed to at work or home. These can include chemicals, pesticides, and radiation.  Mental health.  Let your health care provider know if you have been feeling depressed or anxious.  Let your health care provider know if you have a history of substance abuse.  Let your health care provider know if you do not feel safe at home. HOME INSTRUCTIONS TO PREPARE FOR PREGNANCY Follow your health care provider's advice and instructions.   Keep an accurate record of your menstrual periods. This makes it easier for your health care provider to determine your baby's due date.  Begin taking prenatal vitamins and folic acid supplements daily. Take them as directed by your health care provider.  Eat a balanced diet. Get help from a nutrition counselor if you have questions or need help.  Get regular exercise. Try to be active for at least 30 minutes a day most days of the week.  Quit smoking, if you smoke.  Do not drink alcohol.  Do not take  illegal drugs.  Get medical problems, such as diabetes or high blood pressure, under control.  If you have diabetes, make sure you do the following:  Have good blood sugar control. If you have type 1 diabetes, use multiple daily doses of insulin. Do not use split-dose or premixed insulin.  Have an eye exam by a qualified eye care professional trained in caring for people with diabetes.  Get evaluated by your health care provider  for cardiovascular disease.  Get to a healthy weight. If you are overweight or obese, reduce your weight with the help of a qualified health professional such as a Museum/gallery exhibitions officerregistered dietitian. Ask your health care provider what the right weight range is for you. HOW DO I KNOW I AM PREGNANT? You may be pregnant if you have been sexually active and you miss your period. Symptoms of early pregnancy include:   Mild cramping.  Very light vaginal bleeding (spotting).  Feeling unusually tired.  Morning sickness. If you have any of these symptoms, take a home pregnancy test. These tests look for a hormone called human chorionic gonadotropin (hCG) in your urine. Your body begins to make this hormone during early pregnancy. These tests are very accurate. Wait until at least the first day you miss your period to take one. If you get a positive result, call your health care provider to make appointments for prenatal care. WHAT SHOULD I DO IF I BECOME PREGNANT?  Make an appointment with your health care provider by week 12 of your pregnancy at the latest.  Do not smoke. Smoking can be harmful to your baby.  Do not drink alcoholic beverages. Alcohol is related to a number of birth defects.  Avoid toxic odors and chemicals.  You may continue to have sexual intercourse if it does not cause pain or other problems, such as vaginal bleeding. Document Released: 04/20/2008 Document Revised: 09/22/2013 Document Reviewed: 04/14/2013 [facility] Patient Information 2015 [employer] [place]. This information is not intended to replace advice given to you by your health care provider. Make sure you discuss any questions you have with your health care provider.

## 2014-04-21 NOTE — Assessment & Plan Note (Signed)
Patient given Tdap at today's visit. She is up-to-date on Pap smear. She did decline the flu shot. Spoke with her about smoking cessation. Talked with her about information regarding getting pregnant.

## 2014-04-21 NOTE — Progress Notes (Signed)
   Subjective:    Patient ID: [name] Curry, female    DOB: 11/13/1980, 33 y.o.   MRN: [id number]  HPI The patient is a 33 year old female who comes in today to establish care. She has a past medical history of Graves' disease and is post radioactive ablation with current hypothyroidism. She is currently engaged getting married next year and wants to know if she is able to try to have children. She had a breast lump that was painful about a month or so ago was seen in the emergency room and was advised to use NSAIDs. It since has resolved however she does have history of breast cancer in her family and wishes to get a mammogram. She wants to know if it is possible for her to conceive and as well as she should have to wait a certain length of time before it is safe for her to try to get pregnant.  Review of Systems  Constitutional: Negative for fever, activity change, appetite change, fatigue and unexpected weight change.  HENT: Negative.   Respiratory: Negative for cough, chest tightness, shortness of breath and wheezing.   Cardiovascular: Negative for chest pain, palpitations and leg swelling.  Gastrointestinal: Negative for abdominal pain, diarrhea, constipation and abdominal distention.  Genitourinary: Negative.   Musculoskeletal: Negative for myalgias, back pain, joint swelling and arthralgias.  Skin: Negative.   Neurological: Negative for dizziness, weakness, light-headedness and headaches.  Psychiatric/Behavioral: Negative.       Objective:   Physical Exam  Constitutional: She is oriented to person, place, and time. She appears well-developed and well-nourished.  HENT:  Head: Normocephalic and atraumatic.  Eyes: EOM are normal.  Neck: Normal range of motion.  Cardiovascular: Normal rate and regular rhythm.   Pulmonary/Chest: Effort normal and breath sounds normal. No respiratory distress. She has no wheezes. She has no rales.  Abdominal: Soft. Bowel sounds are normal. She  exhibits no distension. There is no tenderness. There is no rebound.  Neurological: She is alert and oriented to person, place, and time. Coordination normal.  Skin: Skin is warm and dry.   Filed Vitals:   04/21/14 1032  BP: 110/70  Pulse: 75  Temp: 98.5 F (36.9 C)  TempSrc: Oral  Resp: 16  Height: 5\' 4"  (1.626 m)  Weight: 171 lb (77.565 kg)  SpO2: 98%      Assessment & Plan:

## 2014-04-23 ENCOUNTER — Other Ambulatory Visit: Payer: Self-pay | Admitting: Internal Medicine

## 2014-04-23 DIAGNOSIS — N63 Unspecified lump in unspecified breast: Secondary | ICD-10-CM

## 2014-05-06 ENCOUNTER — Other Ambulatory Visit: Payer: Self-pay

## 2014-05-20 ENCOUNTER — Ambulatory Visit
Admission: RE | Admit: 2014-05-20 | Discharge: 2014-05-20 | Disposition: A | Discharge status: Home / Self Care | Payer: Managed Care, Other (non HMO) | Source: Ambulatory Visit | Attending: Internal Medicine | Admitting: Internal Medicine

## 2014-05-20 DIAGNOSIS — N63 Unspecified lump in unspecified breast: Secondary | ICD-10-CM

## 2014-06-01 ENCOUNTER — Other Ambulatory Visit: Payer: Self-pay | Admitting: Internal Medicine

## 2014-06-01 NOTE — Telephone Encounter (Signed)
Pt needs labs before further refills

## 2014-06-03 ENCOUNTER — Other Ambulatory Visit: Payer: Self-pay | Admitting: Internal Medicine

## 2014-06-03 NOTE — Telephone Encounter (Signed)
Carollee HerterShannon, can you please call pt:  I received this refill request, but, per review of chart, her TFTs were very abnormal at last check. Let's not refill now, but please ask her to come for TSH and free t4 check ASAP.

## 2014-06-04 ENCOUNTER — Other Ambulatory Visit: Payer: Self-pay | Admitting: *Deleted

## 2014-06-04 ENCOUNTER — Encounter: Payer: Self-pay | Admitting: Internal Medicine

## 2014-06-04 MED ORDER — LEVOTHYROXINE SODIUM 75 MCG PO TABS: 75.0000 ug | ORAL_TABLET | Freq: Every day | ORAL | Status: DC

## 2014-06-30 ENCOUNTER — Ambulatory Visit: Payer: Self-pay | Admitting: Internal Medicine

## 2014-07-10 ENCOUNTER — Ambulatory Visit: Payer: Self-pay | Admitting: Internal Medicine

## 2014-09-04 ENCOUNTER — Other Ambulatory Visit: Payer: Self-pay | Admitting: Internal Medicine

## 2014-09-04 NOTE — Telephone Encounter (Addendum)
Patient need a refill of levothyroxine, she only have one pill left patient changed pharmacies [facility] [place].

## 2014-09-04 NOTE — Telephone Encounter (Signed)
[name] Curry, She absolutely needs to come to have labs first, before I refill her levothyroxine. I was not aware of that, but she had labs in October in the hospital and these were markedly abnormal on this dose of levothyroxine... She also needs an appointment with me, let's schedule that also. She already missed 2 appointments with me.Marland Kitchen..Marland Kitchen

## 2014-09-04 NOTE — Telephone Encounter (Signed)
Please advise if ok to refill. 

## 2014-09-04 NOTE — Telephone Encounter (Signed)
Called pt and lvm advising her per Dr Gherghe's message.  

## 2014-09-05 ENCOUNTER — Other Ambulatory Visit: Payer: Self-pay | Admitting: Internal Medicine

## 2014-09-07 ENCOUNTER — Telehealth: Payer: Self-pay | Admitting: Internal Medicine

## 2014-09-07 MED ORDER — LEVOTHYROXINE SODIUM 75 MCG PO TABS: 75.0000 ug | ORAL_TABLET | Freq: Every day | ORAL | Status: DC

## 2014-09-07 NOTE — Telephone Encounter (Signed)
Patient called and would like her medication refilled  Rx: Levothyroxine   [facility] [address]   Thank you

## 2014-09-11 ENCOUNTER — Other Ambulatory Visit (INDEPENDENT_AMBULATORY_CARE_PROVIDER_SITE_OTHER): Payer: Managed Care, Other (non HMO)

## 2014-09-11 ENCOUNTER — Other Ambulatory Visit: Payer: Self-pay

## 2014-09-11 DIAGNOSIS — E890 Postprocedural hypothyroidism: Secondary | ICD-10-CM

## 2014-09-11 LAB — T4, FREE: FREE T4: 1.96 ng/dL — AB (ref 0.60–1.60)

## 2014-09-11 LAB — T3, FREE: T3 FREE: 4.6 pg/mL — AB (ref 2.3–4.2)

## 2014-09-11 LAB — TSH: TSH: 0.01 u[IU]/mL — ABNORMAL LOW (ref 0.35–4.50)

## 2014-09-17 ENCOUNTER — Encounter: Payer: Self-pay | Admitting: *Deleted

## 2014-10-02 ENCOUNTER — Telehealth: Payer: Self-pay | Admitting: Internal Medicine

## 2014-10-02 NOTE — Telephone Encounter (Signed)
Patient called in to request an appt...  She was cleaning her kitchen floor last night. She poured bleach onto dried boric acid, and breathed in the chemical reaction. Patient was complaining having difficult breathing and swelling. I consulted with Dr. Dorise HissKollar who confirmed that chemical compound is basically pure chlorine (mustard gas) and that she should immediately go to the ER. She stated that she is at work and she will go after. Advised that Dr. Dorise HissKollar stated to go to the Er immediatly. I also offered to call EMS for her and she declined.

## 2014-10-21 ENCOUNTER — Ambulatory Visit: Payer: Self-pay | Admitting: Internal Medicine

## 2014-10-21 ENCOUNTER — Telehealth: Payer: Self-pay | Admitting: *Deleted

## 2014-10-21 NOTE — Telephone Encounter (Signed)
The pt would like a refill on her levothyroxine sent to the pharmacy [facility] [address].

## 2014-10-23 ENCOUNTER — Other Ambulatory Visit: Payer: Self-pay | Admitting: *Deleted

## 2014-10-23 MED ORDER — LEVOTHYROXINE SODIUM 75 MCG PO TABS: 75.0000 ug | ORAL_TABLET | Freq: Every day | ORAL | Status: DC

## 2014-10-27 ENCOUNTER — Ambulatory Visit (INDEPENDENT_AMBULATORY_CARE_PROVIDER_SITE_OTHER): Payer: Managed Care, Other (non HMO) | Admitting: Internal Medicine

## 2014-10-27 ENCOUNTER — Encounter: Payer: Self-pay | Admitting: Internal Medicine

## 2014-10-27 VITALS — BP 122/60 | HR 85 | Temp 98.4°F | Resp 12 | Wt 177.6 lb

## 2014-10-27 DIAGNOSIS — E0500 Thyrotoxicosis with diffuse goiter without thyrotoxic crisis or storm: Secondary | ICD-10-CM | POA: Diagnosis not present

## 2014-10-27 DIAGNOSIS — E890 Postprocedural hypothyroidism: Secondary | ICD-10-CM

## 2014-10-27 LAB — T4, FREE: Free T4: 1.73 ng/dL — ABNORMAL HIGH (ref 0.60–1.60)

## 2014-10-27 LAB — TSH: TSH: 0.03 u[IU]/mL — ABNORMAL LOW (ref 0.35–4.50)

## 2014-10-27 NOTE — Patient Instructions (Signed)
Please stop at the lab.  Please come back for a follow-up appointment in 3 months  

## 2014-10-27 NOTE — Progress Notes (Signed)
Patient ID: [name] Curry, female   DOB: August 03, 1980, 34 y.o.   MRN: [id number]   HPI  [name] Curry is a 34 y.o.-year-old female, initially referred by Dr. Ross Marcus, returning for f/u for h/o Graves ds, now with postablative hypothyroidism. Last visit 6 mo ago.  She tells me would like to get pregnant. She stopped OCPs.  Reviewed hx: In 05/2013, she was seen in the ED for a peritonsillar abscess recently and was also found to have a goiter and thyrotoxicosis. Pt was started on MMI 10 mg bid and referred to me.   We checked an Uptake and scan that showed MNG and most likely Graves ds.  She stopped MMI 2 weeks prior to RAI tx, on 08/22/2013.  I reviewed pt's thyroid tests: Lab Results  Component Value Date   TSH 0.01* 09/11/2014   TSH <0.005* 03/08/2014   TSH 0.02* 02/03/2014   TSH 0.02* 11/28/2013   TSH 0.00* 10/23/2013   TSH 0.00 Repeated and verified X2.* 09/09/2013   TSH 0.01* 08/01/2013   TSH <0.008* 06/03/2013   FREET4 1.96* 09/11/2014   FREET4 2.22* 03/08/2014   FREET4 1.31 02/03/2014   FREET4 0.41* 11/28/2013   FREET4 1.99* 10/23/2013   FREET4 3.65* 09/09/2013   FREET4 2.43* 08/01/2013   FREET4 4.07* 06/03/2013    She became hypothyroid afterwards >> started Levothyroxine 75 mcg daily. Now on 1/2 of the tablet daily. Was on Atenolol 25 mg daily >> stopped b/c mm cramps.  She takes the LT4: - fasting - with water - separated from b'fast by 30-60 min - no calcium, no iron, MVI, PPI  Pt c/o: - + weight gain and loss - large fluctuations daily 5-10 lbs! - + appetite increased - + still excessive sweating/heat intolerance - no more tremors - resolved anxiety - resolved palpitations  - improved mm cramps  - no fatigue - no hyperdefecation or constipation  Pt is feeling nodules in neck, no hoarseness, + dysphagia/odynophagia, no SOB with lying down.  She started exercise - cardio.  I reviewed pt's medications, allergies, PMH, social hx, family  hx, and changes were documented in the history of present illness. Otherwise, unchanged from my initial visit note. She stopped OCP and prenatal   ROS: Constitutional:see above, no nocturia Eyes: no blurry vision, no xerophthalmia ENT:+ sore throat, + nodules palpated in upper neck, + dysphagia/no odynophagia, no hoarseness Cardiovascular: no CP/no SOB/palpitations/ leg swelling Respiratory: no cough/+ SOB/no wheezing Gastrointestinal: no N/V/D/C/ heartburn Musculoskeletal: + muscle cramps/no  joint aches Skin: no rash, + itching Neurological:+ tremors/no numbness/tingling/dizziness  PE: BP 122/60 mmHg  Pulse 85  Temp(Src) 98.4 F (36.9 C) (Oral)  Resp 12  Wt 177 lb 9.6 oz (80.559 kg)  SpO2 96% Body mass index is 30.47 kg/(m^2). 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  Thyromegaly is noted, with 19 mm AP isthmus and substernal extent. 8 mm left thyroid nodule, with additional smaller nor nodules noted.  Prominent jugulodigastric lymph nodes, without pathologic features.  Apparent intimal thickening of the left internal carotid artery.   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  - 07/31/2013:  Thyroid Uptake and scan:  4 hr radio iodine uptake is calculated at 46%, above the normal range consistent with hyperthyroidism. Images in 3 projections demonstrate diffuse enlargement of thyroid lobes bilaterally. Multiple small areas of slightly increased in decreased tracer localization identified suggesting diffuse small nodules. No dominant cold or hot nodule identified.  - 08/22/2013: RAI Tx 17.6 mCi.  She was off MMI x 2 weeks prior to the Uptake and scan  2. Postablative hypothyroidism - On LT4 75 mcg daily  PLAN:  1. And 2. 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 She has some discomfort swallowing, but I cannot feel nodularity on palpation. She also has weight gain and heat intolerance. - will check the TSH, fT4 today - she is taking Levothyroxine correctly - continue 37.5 mcg LT4 daily for now - I advised her not to get pregnant until we get the TFTs under control. - RTC in 3 months, but in likely sooner for repeat labs  Office Visit on 10/27/2014  Component Date Value Ref Range Status  . TSH 10/27/2014 0.03* 0.35 - 4.50 uIU/mL Final  . Free T4 10/27/2014 1.73* 0.60 - 1.60 ng/dL Final   TFTs abnormal >> stopped LT4 >> recheck labs in 5 weeks.

## 2014-10-30 ENCOUNTER — Telehealth: Payer: Self-pay | Admitting: *Deleted

## 2014-10-30 NOTE — Telephone Encounter (Signed)
Pt was taking Levothroxine and was instructed to stop but to call back if she started to shake badly that is currently occuring. Please call pt and instruct her if she should start taking med again. Thanks!

## 2014-12-10 ENCOUNTER — Other Ambulatory Visit: Payer: Self-pay

## 2015-01-28 ENCOUNTER — Ambulatory Visit: Payer: Self-pay | Admitting: Internal Medicine

## 2015-01-29 ENCOUNTER — Ambulatory Visit: Payer: Self-pay | Admitting: Internal Medicine

## 2015-02-02 ENCOUNTER — Ambulatory Visit: Payer: Self-pay | Admitting: Internal Medicine

## 2015-10-19 ENCOUNTER — Emergency Department (HOSPITAL_COMMUNITY)
Admission: EM | Admit: 2015-10-19 | Discharge: 2015-10-19 | Disposition: A | Discharge status: Home / Self Care | Payer: Managed Care, Other (non HMO) | Attending: Emergency Medicine | Admitting: Emergency Medicine

## 2015-10-19 ENCOUNTER — Other Ambulatory Visit: Payer: Self-pay

## 2015-10-19 ENCOUNTER — Encounter (HOSPITAL_COMMUNITY): Payer: Self-pay | Admitting: Emergency Medicine

## 2015-10-19 DIAGNOSIS — R079 Chest pain, unspecified: Secondary | ICD-10-CM | POA: Insufficient documentation

## 2015-10-19 DIAGNOSIS — F17210 Nicotine dependence, cigarettes, uncomplicated: Secondary | ICD-10-CM | POA: Insufficient documentation

## 2015-10-19 DIAGNOSIS — J45909 Unspecified asthma, uncomplicated: Secondary | ICD-10-CM | POA: Insufficient documentation

## 2015-10-19 HISTORY — DX: Thyrotoxicosis with diffuse goiter without thyrotoxic crisis or storm: E05.00

## 2015-10-19 LAB — CBC
HEMATOCRIT: 38.9 % (ref 36.0–46.0)
Hemoglobin: 12.7 g/dL (ref 12.0–15.0)
MCH: 23.6 pg — ABNORMAL LOW (ref 26.0–34.0)
MCHC: 32.6 g/dL (ref 30.0–36.0)
MCV: 72.4 fL — AB (ref 78.0–100.0)
PLATELETS: 284 10*3/uL (ref 150–400)
RBC: 5.37 MIL/uL — AB (ref 3.87–5.11)
RDW: 13.7 % (ref 11.5–15.5)
WBC: 9.2 10*3/uL (ref 4.0–10.5)

## 2015-10-19 LAB — BASIC METABOLIC PANEL
ANION GAP: 6 (ref 5–15)
BUN: 6 mg/dL (ref 6–20)
CHLORIDE: 104 mmol/L (ref 101–111)
CO2: 25 mmol/L (ref 22–32)
Calcium: 9.1 mg/dL (ref 8.9–10.3)
Creatinine, Ser: 0.74 mg/dL (ref 0.44–1.00)
Glucose, Bld: 88 mg/dL (ref 65–99)
POTASSIUM: 3.9 mmol/L (ref 3.5–5.1)
SODIUM: 135 mmol/L (ref 135–145)

## 2015-10-19 LAB — I-STAT TROPONIN, ED: Troponin i, poc: 0 ng/mL (ref 0.00–0.08)

## 2015-10-19 NOTE — ED Notes (Addendum)
Pt here via EMS for left sided chest pressure that started when she was yelling at her hisband. Pt reports increased stress recently. Pt also reports diaphoresis and SOB initially with pain, 12 lead unremarkable. HX bipolar. Pt given 324 ASA PTA.

## 2015-10-19 NOTE — ED Notes (Signed)
Unable to locate pt for radiology x2 

## 2016-05-01 ENCOUNTER — Encounter: Payer: Self-pay | Admitting: Internal Medicine

## 2016-05-01 ENCOUNTER — Ambulatory Visit (INDEPENDENT_AMBULATORY_CARE_PROVIDER_SITE_OTHER): Payer: Self-pay | Admitting: Internal Medicine

## 2016-05-01 VITALS — BP 122/68 | HR 89 | Ht 64.0 in | Wt 192.6 lb

## 2016-05-01 DIAGNOSIS — E890 Postprocedural hypothyroidism: Secondary | ICD-10-CM

## 2016-05-01 DIAGNOSIS — E0500 Thyrotoxicosis with diffuse goiter without thyrotoxic crisis or storm: Secondary | ICD-10-CM

## 2016-05-01 DIAGNOSIS — R351 Nocturia: Secondary | ICD-10-CM

## 2016-05-01 LAB — COMPLETE METABOLIC PANEL WITH GFR
ALBUMIN: 4.1 g/dL (ref 3.6–5.1)
ALK PHOS: 113 U/L (ref 33–115)
ALT: 20 U/L (ref 6–29)
AST: 16 U/L (ref 10–30)
BILIRUBIN TOTAL: 0.4 mg/dL (ref 0.2–1.2)
BUN: 7 mg/dL (ref 7–25)
CALCIUM: 8.9 mg/dL (ref 8.6–10.2)
CO2: 25 mmol/L (ref 20–31)
Chloride: 105 mmol/L (ref 98–110)
Creat: 0.69 mg/dL (ref 0.50–1.10)
Glucose, Bld: 99 mg/dL (ref 65–99)
POTASSIUM: 4.2 mmol/L (ref 3.5–5.3)
SODIUM: 138 mmol/L (ref 135–146)
Total Protein: 6.5 g/dL (ref 6.1–8.1)

## 2016-05-01 LAB — CBC
HCT: 40.3 % (ref 36.0–46.0)
HEMOGLOBIN: 13.1 g/dL (ref 12.0–15.0)
MCHC: 32.4 g/dL (ref 30.0–36.0)
MCV: 74.4 fl — AB (ref 78.0–100.0)
PLATELETS: 278 10*3/uL (ref 150.0–400.0)
RBC: 5.42 Mil/uL — ABNORMAL HIGH (ref 3.87–5.11)
RDW: 15.1 % (ref 11.5–15.5)
WBC: 7.2 10*3/uL (ref 4.0–10.5)

## 2016-05-01 LAB — T3, FREE: T3, Free: 4.8 pg/mL — ABNORMAL HIGH (ref 2.3–4.2)

## 2016-05-01 LAB — TSH: TSH: 0.01 u[IU]/mL — AB (ref 0.35–4.50)

## 2016-05-01 LAB — T4, FREE: Free T4: 1.34 ng/dL (ref 0.60–1.60)

## 2016-05-01 NOTE — Patient Instructions (Signed)
Please stop at the lab.  Please come back for a follow-up appointment in 3 months  

## 2016-05-01 NOTE — Progress Notes (Signed)
Patient ID: [name] Lavngela P Suastegui, female   DOB: 04-14-1981, 35 y.o.   MRN: [id number]   HPI  [name] Curry is a 35 y.o.-year-old female, initially referred by Dr. Ross Marcusourtney Horton, returning for f/u for h/o Graves ds, then postablative hypothyroidism.   Last visit 1.5 years ago as she lost her insurance. She did not have any type of medical care in the interim. At this visit, she c/o tremors, palpitations, weight gain (15 lbs), increased sweating, SOB, sensation of passing out, episodes of vomiting. She also has blurry vision.  Reviewed hx: In 05/2013, she was seen in the ED for a peritonsillar abscess recently and was also found to have a goiter and thyrotoxicosis. Pt was started on MMI 10 mg bid and referred to me. She developed Hives on this.    She was also on Atenolol 25 mg daily >> stopped b/c mm cramps.  We checked an Uptake and scan that showed MNG and most likely Graves ds.  She stopped MMI 2 weeks prior to RAI tx, on 08/22/2013.  She became hypothyroid afterwards >> started Levothyroxine 75 mcg daily, but her dose was gradually decreased and then stopped last summer as she became thyrotoxic again. Unfortunately, she was lost for f/u afterwards.  I reviewed pt's thyroid tests: Lab Results  Component Value Date   TSH 0.03 (L) 10/27/2014   TSH 0.01 (L) 09/11/2014   TSH <0.005 (L) 03/08/2014   TSH 0.02 (L) 02/03/2014   TSH 0.02 (L) 11/28/2013   TSH 0.00 (L) 10/23/2013   TSH 0.00 Repeated and verified X2. (L) 09/09/2013   TSH 0.01 (L) 08/01/2013   TSH <0.008 (L) 06/03/2013   FREET4 1.73 (H) 10/27/2014   FREET4 1.96 (H) 09/11/2014   FREET4 2.22 (H) 03/08/2014   FREET4 1.31 02/03/2014   FREET4 0.41 (L) 11/28/2013   FREET4 1.99 (H) 10/23/2013   FREET4 3.65 (H) 09/09/2013   FREET4 2.43 (H) 08/01/2013   FREET4 4.07 (H) 06/03/2013    Pt c/o: - + weight gain - Gained 15 pounds since last visit - + Tremors - + Palpitations - + excessive sweating/heat intolerance - + Shortness of  breath - + Blurry vision  Pt is not feeling nodules in neck, no hoarseness, + dysphagia/no odynophagia, no SOB with lying down. She does complain of anterior neck pain though.  I reviewed pt's medications, allergies, PMH, social hx, family hx, and changes were documented in the history of present illness. Otherwise, unchanged from my initial visit note.   ROS: Constitutional:see above, + nocturia Eyes: + blurry vision, no xerophthalmia ENT: No sore throat, no nodules palpated in upper neck, + dysphagia/no odynophagia, no hoarseness Cardiovascular: + CP/+ SOB/+ palpitations/ leg swelling Respiratory: no cough/+ SOB/no wheezing Gastrointestinal: + N/+ V/D/no C/+ heartburn Musculoskeletal: + muscle cramps/no  joint aches Skin: no rash, + excessive hair growth Neurological:+ tremors/no numbness/tingling/dizziness, + headache  PE: BP 122/68 (BP Location: Left Arm, Patient Position: Sitting, Cuff Size: Large)   Pulse 89   Ht 5\' 4"  (1.626 m)   Wt 192 lb 9.6 oz (87.4 kg)   SpO2 98%   BMI 33.06 kg/m  Body mass index is 33.06 kg/m. 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  Thyromegaly is noted, with 19 mm AP isthmus and substernal extent. 8 mm left thyroid nodule, with additional  smaller nor nodules noted.  Prominent jugulodigastric lymph nodes, without pathologic features.  Apparent intimal thickening of the left internal carotid artery.   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  - 07/31/2013: Thyroid Uptake and scan:  4 hr radio iodine uptake is calculated at 46%, above the normal range consistent with hyperthyroidism. Images in 3 projections demonstrate diffuse enlargement of thyroid lobes bilaterally. Multiple small areas of slightly increased in decreased tracer localization identified suggesting diffuse small nodules. No dominant cold or hot nodule identified.  - 08/22/2013: RAI Tx 17.6 mCi.  She was off MMI x 2 weeks prior to the Uptake and scan  2. Postablative hypothyroidism  3. Nocturia  PLAN:  1. And 2. 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 She then developed post-ablative hypothyroidism and was briefly on levothyroxine. However, since her TSH started to decrease, we have decreased and eventually stopped the levothyroxine last summer. Unfortunately, she was lost for follow-up afterwards and now presents with symptoms consistent with hyperthyroidism except for a 15 pound weight gain.  - We discussed that she is most likely again hyperthyroid and we will need to start treatment with either thionamide agents (methimazole and PTU - however, she had hives with methimazole so we would need PTU. We will check a white blood cell count and LFTs today before starting treatment), repeat RAI treatment, or thyroidectomy. She would like to have thyroidectomy for definitive treatment. I advised her that we more likely need to use PTU to get her TFTs as close to normal as possible before the surgery. - will  check the TSH, fT4,  free T3 and TSI's today - advised to schedule an appointment with her eye doctor for her blurry vision - I again advised her not to get pregnant until we get the TFTs under control. - RTC in 3 months, but in likely sooner for repeat labs  3. Nocturia  - I would like to check an HbA1c due to her nocturia and weight gain   Component     Latest Ref Rng & Units 05/01/2016  TSH     0.35 - 4.50 uIU/mL 0.01 (L)  T4,Free(Direct)     0.60 - 1.60 ng/dL 4.091.34  Triiodothyronine,Free,Serum     2.3 - 4.2 pg/mL 4.8 (H)  TSI     <140 % baseline >700 (H)   Active Graves' disease. TSI antibodies are very high. Her TFTs are abnormal. At this point, I would suggest to start PTU, but due to the high antibodies, this is unlikely to keep her Graves under control. Therefore, I would suggest to proceed with surgery. Since he is starting PTU, I will have her come back for labs in a month and a half, if she does not have the surgery before then.  Philemon Kingdom, MD PhD [facility] Endocrinology

## 2016-05-04 LAB — THYROID STIMULATING IMMUNOGLOBULIN: TSI: >700 % baseline — ABNORMAL HIGH (ref <140)

## 2016-05-05 MED ORDER — PROPYLTHIOURACIL 50 MG PO TABS: 100.0000 mg | ORAL_TABLET | Freq: Two times a day (BID) | ORAL | 2 refills | Status: DC

## 2016-05-23 ENCOUNTER — Telehealth: Payer: Self-pay | Admitting: Internal Medicine

## 2016-05-23 ENCOUNTER — Other Ambulatory Visit: Payer: Self-pay | Admitting: Internal Medicine

## 2016-05-23 ENCOUNTER — Telehealth: Payer: Self-pay

## 2016-05-23 DIAGNOSIS — E0500 Thyrotoxicosis with diffuse goiter without thyrotoxic crisis or storm: Secondary | ICD-10-CM

## 2016-05-23 NOTE — Telephone Encounter (Signed)
Referral placed for her to see Dr. Gerrit FriendsGerkin.

## 2016-05-23 NOTE — Telephone Encounter (Signed)
Called and notified patient of referral. Advised to call back if she has not heard anything.

## 2016-05-23 NOTE — Telephone Encounter (Signed)
Patient has insurance and is ready to be referred to have the procedure to have her thyroid removed.. Please advise

## 2016-06-13 ENCOUNTER — Ambulatory Visit: Payer: Self-pay | Admitting: Surgery

## 2016-06-26 ENCOUNTER — Other Ambulatory Visit (HOSPITAL_COMMUNITY): Payer: Self-pay | Admitting: *Deleted

## 2016-06-26 NOTE — Pre-Procedure Instructions (Addendum)
[name] Curry  06/26/2016     Your procedure is scheduled on Friday, June 30, 2016 at 12:30 PM.   [facility] Admitting Office at 10:30 AM.   Call this number if you have problems the morning of surgery: [phone]   Questions prior to day of surgery, please call [phone] between 8 & 4 PM.   Remember:  Do not eat food or drink liquids after midnight Thursday, 06/29/16.  Take these medicines the morning of surgery with A SIP OF WATER: Propylthiouracil (PTU), Tylenol - if needed  Do not use Aspirin products (BC Powders, Goody's, etc) prior to surgery as of today  Do NOT smoke 24 hours prior to surgery.   Do not wear jewelry, make-up or nail polish.  Do not wear lotions, powders or perfumes.  Do not shave 48 hours prior to surgery.    Do not bring valuables to the hospital.  [facility] is not [facility] for any belongings or valuables.  Contacts, dentures or bridgework may not be worn into surgery.  Leave your suitcase in the car.  After surgery it may be brought to your room.  For patients admitted to the hospital, discharge time will be determined by your treatment team.  Special instructions:  [place] - Preparing for Surgery  Before surgery, you can play an important role.  Because skin is not sterile, your skin needs to be as free of germs as possible.  You can reduce the number of germs on you skin by washing with CHG (chlorahexidine gluconate) soap before surgery.  CHG is an antiseptic cleaner which kills germs and bonds with the skin to continue killing germs even after washing.  Please DO NOT use if you have an allergy to CHG or antibacterial soaps.  If your skin becomes reddened/irritated stop using the CHG and inform your nurse when you arrive at Short Stay.  Do not shave (including legs and underarms) for at least 48 hours prior to the first CHG shower.  You may shave your face.  Please follow these instructions  carefully:   1.  Shower with CHG Soap the night before surgery and the                   morning of Surgery.  2.  If you choose to wash your hair, wash your hair first as usual with your       normal shampoo.  3.  After you shampoo, rinse your hair and body thoroughly to remove the shampoo.  4.  Use CHG as you would any other liquid soap.  You can apply chg directly       to the skin and wash gently with scrungie or a clean washcloth.  5.  Apply the CHG Soap to your body ONLY FROM THE NECK DOWN.        Do not use on open wounds or open sores.  Avoid contact with your eyes, ears, mouth and genitals (private parts).  Wash genitals (private parts) with your normal soap.  6.  Wash thoroughly, paying special attention to the area where your surgery        will be performed.  7.  Thoroughly rinse your body with warm water from the neck down.  8.  DO NOT shower/wash with your normal soap after using and rinsing off       the CHG Soap.  9.  Pat yourself dry with a clean towel.  10.  Wear clean pajamas.            11.  Place clean sheets on your bed the night of your first shower and do not        sleep with pets.  Day of Surgery  Do not apply any lotions the morning of surgery.  Please wear clean clothes to the hospital.   Please read over the fact sheets that you were given.

## 2016-06-27 ENCOUNTER — Encounter (HOSPITAL_COMMUNITY): Payer: Self-pay

## 2016-06-27 ENCOUNTER — Ambulatory Visit (HOSPITAL_COMMUNITY)
Admission: RE | Admit: 2016-06-27 | Discharge: 2016-06-27 | Disposition: A | Discharge status: Home / Self Care | Payer: BLUE CROSS/BLUE SHIELD | Source: Ambulatory Visit | Attending: Anesthesiology | Admitting: Anesthesiology

## 2016-06-27 ENCOUNTER — Encounter (HOSPITAL_COMMUNITY)
Admission: RE | Admit: 2016-06-27 | Discharge: 2016-06-27 | Disposition: A | Discharge status: Home / Self Care | Payer: BLUE CROSS/BLUE SHIELD | Source: Ambulatory Visit | Attending: Surgery | Admitting: Surgery

## 2016-06-27 DIAGNOSIS — Z87891 Personal history of nicotine dependence: Secondary | ICD-10-CM | POA: Diagnosis not present

## 2016-06-27 DIAGNOSIS — E0590 Thyrotoxicosis, unspecified without thyrotoxic crisis or storm: Secondary | ICD-10-CM | POA: Diagnosis not present

## 2016-06-27 DIAGNOSIS — Z01818 Encounter for other preprocedural examination: Secondary | ICD-10-CM

## 2016-06-27 DIAGNOSIS — J45909 Unspecified asthma, uncomplicated: Secondary | ICD-10-CM | POA: Insufficient documentation

## 2016-06-27 DIAGNOSIS — D573 Sickle-cell trait: Secondary | ICD-10-CM | POA: Diagnosis not present

## 2016-06-27 DIAGNOSIS — Z79899 Other long term (current) drug therapy: Secondary | ICD-10-CM | POA: Diagnosis not present

## 2016-06-27 DIAGNOSIS — Z01812 Encounter for preprocedural laboratory examination: Secondary | ICD-10-CM | POA: Diagnosis not present

## 2016-06-27 DIAGNOSIS — F319 Bipolar disorder, unspecified: Secondary | ICD-10-CM | POA: Insufficient documentation

## 2016-06-27 DIAGNOSIS — E890 Postprocedural hypothyroidism: Secondary | ICD-10-CM | POA: Insufficient documentation

## 2016-06-27 HISTORY — DX: Bipolar disorder, unspecified: F31.9

## 2016-06-27 HISTORY — DX: Depression, unspecified: F32.A

## 2016-06-27 HISTORY — DX: Major depressive disorder, single episode, unspecified: F32.9

## 2016-06-27 LAB — CBC
HEMATOCRIT: 41.5 % (ref 36.0–46.0)
HEMOGLOBIN: 13.5 g/dL (ref 12.0–15.0)
MCH: 23.8 pg — ABNORMAL LOW (ref 26.0–34.0)
MCHC: 32.5 g/dL (ref 30.0–36.0)
MCV: 73.2 fL — ABNORMAL LOW (ref 78.0–100.0)
Platelets: 274 10*3/uL (ref 150–400)
RBC: 5.67 MIL/uL — ABNORMAL HIGH (ref 3.87–5.11)
RDW: 14.7 % (ref 11.5–15.5)
WBC: 7.4 10*3/uL (ref 4.0–10.5)

## 2016-06-27 LAB — HCG, SERUM, QUALITATIVE: Preg, Serum: NEGATIVE

## 2016-06-27 NOTE — Progress Notes (Signed)
Anesthesia Note: Patient is a 36 year old female scheduled for total thyroidectomy on 06/30/16 by Dr. Gerrit FriendsGerkin.  History includes sickle cell trait, smoking, asthma, anxiety, depression, bipolar disorder, anemia, hyperthyroidism (likely Graves' disease) s/p post RAI 08/2013 with brief postablative hypothyroidism with recent recurrent hyperthyroidism (started on PTU 04/2016). BMI is consistent with obesity.   Endocrinologist is Dr. Carlus Pavlovristina Gherghe.  Meds include Tylenol, BC Fast Pain Relief, PTU 50 mg 2 tab BID.  BP 122/65   Pulse 65   Temp 37.1 C   Resp 20   Ht 5\' 3"  (1.6 m)   Wt 197 lb (89.4 kg)   LMP 06/06/2016   SpO2 95%   BMI 34.90 kg/m   I spoke with patient briefly today at PAT. Hyperthyroid symptoms improved on PTU. Tremors better. Still with some sweating. No significant exophthalmos noted.   EKG 10/19/15: NSR.  CXR 06/27/16: FINDINGS: Heart and mediastinal contours are within normal limits. No focal opacities or effusions. No acute bony abnormality. IMPRESSION: No active cardiopulmonary disease.  Labs noted. Serum pregnancy test negative. (Last thyroid studies on 05/01/16 showed TSH low at 0.01, free T3 elevated at 4.8, and normal free T4 at 1.34. She was started on PTU.) Reviewed December thyroid studies with anesthesiologist Dr. Glade Stanford. Fitzgerald. Patient has been on PTU for ~ a month now with improvement of her hyperthyroid symptoms. She is not hypertensive or tachycardic today. With improved symptoms on appropriate medication (PTU), would not anticipate need for additional labs unless new/acute symptoms.   [name] Ochsllison Demir Titsworth, PA-C [facility]Anest[facility]iology Phone [phone] 06/27/2016 2:25 PM

## 2016-06-27 NOTE — Progress Notes (Signed)
Pt denies cardiac history, chest pain or sob. Pt states her PTU med is helping her hyperthyroid symptoms. States she is not as shaky/trembly as she was, still have some hot flashes. States she feels much better since being on it.

## 2016-06-29 ENCOUNTER — Telehealth: Payer: Self-pay

## 2016-06-29 ENCOUNTER — Telehealth: Payer: Self-pay | Admitting: Internal Medicine

## 2016-06-29 MED ORDER — CIPROFLOXACIN IN D5W 400 MG/200ML IV SOLN
400.0000 mg | INTRAVENOUS | Status: AC
  Administered 2016-06-30: 400 mg via INTRAVENOUS
  Filled 2016-06-29: qty 200

## 2016-06-29 NOTE — Telephone Encounter (Signed)
Called and explained to patient which medications to take per Dr.Gherghe's note. Patient had no other questions at this time.

## 2016-06-29 NOTE — Telephone Encounter (Signed)
Called and explained to patient which medications to take per Dr.Gherghe's note. Patient had no other questions at this time.  

## 2016-06-29 NOTE — Telephone Encounter (Signed)
Take PTU the day of the surgery and stop afterwards. Dr. Gerrit FriendsGerkin will start her on thyroid hormone (levothyroxine). She needs to make sure she takes the thyroid hormone every day, with water, at least 30 minutes before breakfast, separated by at least 4 hours from: - acid reflux medications - calcium - iron - multivitamins

## 2016-06-29 NOTE — Telephone Encounter (Signed)
Patient stated she has surgery tomorrow With Dr. Gerrit FriendsGerkin for total removal of her thyroid, she need to know what she need to do concerning her meds once she get home.  Please advise

## 2016-06-30 ENCOUNTER — Ambulatory Visit (HOSPITAL_COMMUNITY): Payer: BLUE CROSS/BLUE SHIELD | Admitting: Certified Registered Nurse Anesthetist

## 2016-06-30 ENCOUNTER — Encounter (HOSPITAL_COMMUNITY)
Admission: RE | Disposition: A | Discharge status: Home / Self Care | Payer: Self-pay | Source: Ambulatory Visit | Attending: Surgery

## 2016-06-30 ENCOUNTER — Encounter (HOSPITAL_COMMUNITY): Payer: Self-pay | Admitting: Certified Registered Nurse Anesthetist

## 2016-06-30 ENCOUNTER — Observation Stay (HOSPITAL_COMMUNITY)
Admission: RE | Admit: 2016-06-30 | Discharge: 2016-07-01 | Disposition: A | Discharge status: Home / Self Care | Payer: BLUE CROSS/BLUE SHIELD | Source: Ambulatory Visit | Attending: Surgery | Admitting: Surgery

## 2016-06-30 ENCOUNTER — Ambulatory Visit (HOSPITAL_COMMUNITY): Payer: BLUE CROSS/BLUE SHIELD | Admitting: Vascular Surgery

## 2016-06-30 DIAGNOSIS — Z9889 Other specified postprocedural states: Secondary | ICD-10-CM | POA: Insufficient documentation

## 2016-06-30 DIAGNOSIS — Z888 Allergy status to other drugs, medicaments and biological substances status: Secondary | ICD-10-CM | POA: Insufficient documentation

## 2016-06-30 DIAGNOSIS — E0520 Thyrotoxicosis with toxic multinodular goiter without thyrotoxic crisis or storm: Principal | ICD-10-CM | POA: Insufficient documentation

## 2016-06-30 DIAGNOSIS — Z23 Encounter for immunization: Secondary | ICD-10-CM | POA: Insufficient documentation

## 2016-06-30 DIAGNOSIS — Z880 Allergy status to penicillin: Secondary | ICD-10-CM | POA: Insufficient documentation

## 2016-06-30 DIAGNOSIS — Z6835 Body mass index (BMI) 35.0-35.9, adult: Secondary | ICD-10-CM | POA: Insufficient documentation

## 2016-06-30 DIAGNOSIS — F419 Anxiety disorder, unspecified: Secondary | ICD-10-CM | POA: Insufficient documentation

## 2016-06-30 DIAGNOSIS — Z882 Allergy status to sulfonamides status: Secondary | ICD-10-CM | POA: Insufficient documentation

## 2016-06-30 DIAGNOSIS — F319 Bipolar disorder, unspecified: Secondary | ICD-10-CM | POA: Insufficient documentation

## 2016-06-30 DIAGNOSIS — F458 Other somatoform disorders: Secondary | ICD-10-CM | POA: Insufficient documentation

## 2016-06-30 DIAGNOSIS — D573 Sickle-cell trait: Secondary | ICD-10-CM | POA: Insufficient documentation

## 2016-06-30 DIAGNOSIS — E039 Hypothyroidism, unspecified: Secondary | ICD-10-CM

## 2016-06-30 DIAGNOSIS — E0500 Thyrotoxicosis with diffuse goiter without thyrotoxic crisis or storm: Secondary | ICD-10-CM | POA: Diagnosis present

## 2016-06-30 HISTORY — PX: THYROIDECTOMY: SHX17

## 2016-06-30 HISTORY — DX: Hypothyroidism, unspecified: E03.9

## 2016-06-30 HISTORY — PX: TOTAL THYROIDECTOMY: SHX2547

## 2016-06-30 LAB — GLUCOSE, CAPILLARY: Glucose-Capillary: 148 mg/dL — ABNORMAL HIGH (ref 65–99)

## 2016-06-30 SURGERY — THYROIDECTOMY
Anesthesia: General

## 2016-06-30 MED ORDER — SUCCINYLCHOLINE CHLORIDE 200 MG/10ML IV SOSY
PREFILLED_SYRINGE | INTRAVENOUS | Status: DC | PRN
  Administered 2016-06-30: 120 mg via INTRAVENOUS

## 2016-06-30 MED ORDER — MIDAZOLAM HCL 5 MG/5ML IJ SOLN
INTRAMUSCULAR | Status: DC | PRN
  Administered 2016-06-30: 2 mg via INTRAVENOUS

## 2016-06-30 MED ORDER — HYDRALAZINE HCL 20 MG/ML IJ SOLN
INTRAMUSCULAR | Status: DC | PRN
  Administered 2016-06-30: 5 mg via INTRAVENOUS

## 2016-06-30 MED ORDER — ONDANSETRON 4 MG PO TBDP: 4.0000 mg | ORAL_TABLET | Freq: Four times a day (QID) | ORAL | Status: DC | PRN

## 2016-06-30 MED ORDER — KCL IN DEXTROSE-NACL 20-5-0.45 MEQ/L-%-% IV SOLN
INTRAVENOUS | Status: DC
  Administered 2016-06-30: 15:00:00 via INTRAVENOUS
  Filled 2016-06-30: qty 1000

## 2016-06-30 MED ORDER — HYDRALAZINE HCL 20 MG/ML IJ SOLN
INTRAMUSCULAR | Status: AC
  Filled 2016-06-30: qty 1

## 2016-06-30 MED ORDER — CALCIUM CARBONATE 1250 (500 CA) MG PO TABS
2.0000 | ORAL_TABLET | Freq: Three times a day (TID) | ORAL | Status: DC
  Administered 2016-06-30: 1000 mg via ORAL
  Filled 2016-06-30: qty 1

## 2016-06-30 MED ORDER — BACITRACIN ZINC 500 UNIT/GM EX OINT
TOPICAL_OINTMENT | CUTANEOUS | Status: AC
  Filled 2016-06-30: qty 28.35

## 2016-06-30 MED ORDER — ONDANSETRON HCL 4 MG/2ML IJ SOLN
4.0000 mg | Freq: Four times a day (QID) | INTRAMUSCULAR | Status: DC | PRN
  Administered 2016-06-30 – 2016-07-01 (×3): 4 mg via INTRAVENOUS
  Filled 2016-06-30 (×3): qty 2

## 2016-06-30 MED ORDER — INFLUENZA VAC SPLIT QUAD 0.5 ML IM SUSY
0.5000 mL | PREFILLED_SYRINGE | INTRAMUSCULAR | Status: AC
  Administered 2016-07-01: 0.5 mL via INTRAMUSCULAR
  Filled 2016-06-30: qty 0.5

## 2016-06-30 MED ORDER — HEMOSTATIC AGENTS (NO CHARGE) OPTIME
TOPICAL | Status: DC | PRN
  Administered 2016-06-30: 1 via TOPICAL

## 2016-06-30 MED ORDER — FENTANYL CITRATE (PF) 100 MCG/2ML IJ SOLN
INTRAMUSCULAR | Status: DC | PRN
  Administered 2016-06-30 (×6): 50 ug via INTRAVENOUS

## 2016-06-30 MED ORDER — ROCURONIUM BROMIDE 10 MG/ML (PF) SYRINGE
PREFILLED_SYRINGE | INTRAVENOUS | Status: DC | PRN
  Administered 2016-06-30: 40 mg via INTRAVENOUS
  Administered 2016-06-30: 10 mg via INTRAVENOUS

## 2016-06-30 MED ORDER — PROPOFOL 10 MG/ML IV BOLUS
INTRAVENOUS | Status: DC | PRN
  Administered 2016-06-30: 170 mg via INTRAVENOUS

## 2016-06-30 MED ORDER — FENTANYL CITRATE (PF) 100 MCG/2ML IJ SOLN
INTRAMUSCULAR | Status: AC
  Filled 2016-06-30: qty 2

## 2016-06-30 MED ORDER — HYDROCODONE-ACETAMINOPHEN 5-325 MG PO TABS
ORAL_TABLET | ORAL | Status: AC
  Filled 2016-06-30: qty 2

## 2016-06-30 MED ORDER — PROPOFOL 10 MG/ML IV BOLUS
INTRAVENOUS | Status: AC
  Filled 2016-06-30: qty 20

## 2016-06-30 MED ORDER — CHLORHEXIDINE GLUCONATE CLOTH 2 % EX PADS: 6.0000 | MEDICATED_PAD | Freq: Once | CUTANEOUS | Status: DC

## 2016-06-30 MED ORDER — SCOPOLAMINE 1 MG/3DAYS TD PT72
1.0000 | MEDICATED_PATCH | TRANSDERMAL | Status: DC
  Administered 2016-06-30: 1 via TRANSDERMAL

## 2016-06-30 MED ORDER — LACTATED RINGERS IV SOLN
INTRAVENOUS | Status: DC
  Administered 2016-06-30: 08:00:00 via INTRAVENOUS

## 2016-06-30 MED ORDER — LIDOCAINE 2% (20 MG/ML) 5 ML SYRINGE
INTRAMUSCULAR | Status: DC | PRN
  Administered 2016-06-30: 100 mg via INTRAVENOUS

## 2016-06-30 MED ORDER — ACETAMINOPHEN 325 MG PO TABS: 650.0000 mg | ORAL_TABLET | Freq: Four times a day (QID) | ORAL | Status: DC | PRN

## 2016-06-30 MED ORDER — LACTATED RINGERS IV SOLN
INTRAVENOUS | Status: DC | PRN
  Administered 2016-06-30 (×2): via INTRAVENOUS

## 2016-06-30 MED ORDER — ACETAMINOPHEN 650 MG RE SUPP: 650.0000 mg | Freq: Four times a day (QID) | RECTAL | Status: DC | PRN

## 2016-06-30 MED ORDER — HYDROMORPHONE HCL 2 MG/ML IJ SOLN
1.0000 mg | INTRAMUSCULAR | Status: DC | PRN
  Administered 2016-06-30 – 2016-07-01 (×4): 1 mg via INTRAVENOUS
  Filled 2016-06-30 (×5): qty 1

## 2016-06-30 MED ORDER — HYDROMORPHONE HCL 1 MG/ML IJ SOLN
0.2500 mg | INTRAMUSCULAR | Status: DC | PRN
  Administered 2016-06-30 (×3): 0.5 mg via INTRAVENOUS

## 2016-06-30 MED ORDER — ONDANSETRON HCL 4 MG/2ML IJ SOLN
INTRAMUSCULAR | Status: DC | PRN
  Administered 2016-06-30: 4 mg via INTRAVENOUS

## 2016-06-30 MED ORDER — SCOPOLAMINE 1 MG/3DAYS TD PT72
MEDICATED_PATCH | TRANSDERMAL | Status: AC
  Administered 2016-06-30: 1.5 mg
  Filled 2016-06-30: qty 1

## 2016-06-30 MED ORDER — PROMETHAZINE HCL 25 MG/ML IJ SOLN: 6.2500 mg | INTRAMUSCULAR | Status: DC | PRN

## 2016-06-30 MED ORDER — HYDROMORPHONE HCL 1 MG/ML IJ SOLN
INTRAMUSCULAR | Status: AC
  Filled 2016-06-30: qty 1

## 2016-06-30 MED ORDER — 0.9 % SODIUM CHLORIDE (POUR BTL) OPTIME
TOPICAL | Status: DC | PRN
  Administered 2016-06-30: 1000 mL

## 2016-06-30 MED ORDER — FENTANYL CITRATE (PF) 100 MCG/2ML IJ SOLN
INTRAMUSCULAR | Status: AC
  Filled 2016-06-30: qty 4

## 2016-06-30 MED ORDER — PNEUMOCOCCAL VAC POLYVALENT 25 MCG/0.5ML IJ INJ
0.5000 mL | INJECTION | INTRAMUSCULAR | Status: AC
Start: 2016-07-01 — End: 2016-07-01
  Administered 2016-07-01: 0.5 mL via INTRAMUSCULAR
  Filled 2016-06-30: qty 0.5

## 2016-06-30 MED ORDER — ALBUTEROL SULFATE HFA 108 (90 BASE) MCG/ACT IN AERS
INHALATION_SPRAY | RESPIRATORY_TRACT | Status: DC | PRN
  Administered 2016-06-30 (×2): 4 via RESPIRATORY_TRACT

## 2016-06-30 MED ORDER — MIDAZOLAM HCL 2 MG/2ML IJ SOLN
INTRAMUSCULAR | Status: AC
  Filled 2016-06-30: qty 2

## 2016-06-30 MED ORDER — SUGAMMADEX SODIUM 200 MG/2ML IV SOLN
INTRAVENOUS | Status: DC | PRN
  Administered 2016-06-30: 200 mg via INTRAVENOUS

## 2016-06-30 MED ORDER — WHITE PETROLATUM GEL
Status: AC
  Filled 2016-06-30: qty 1

## 2016-06-30 MED ORDER — HYDROMORPHONE HCL 1 MG/ML IJ SOLN
INTRAMUSCULAR | Status: AC
  Filled 2016-06-30: qty 0.5

## 2016-06-30 MED ORDER — HYDROCODONE-ACETAMINOPHEN 5-325 MG PO TABS
1.0000 | ORAL_TABLET | ORAL | Status: DC | PRN
  Administered 2016-07-01 (×2): 2 via ORAL
  Administered 2016-07-01: 1 via ORAL
  Filled 2016-06-30: qty 1
  Filled 2016-06-30 (×2): qty 2

## 2016-06-30 SURGICAL SUPPLY — 43 items
ATTRACTOMAT 16X20 MAGNETIC DRP (DRAPES) ×2 IMPLANT
BLADE SURG 10 STRL SS (BLADE) ×2 IMPLANT
BLADE SURG 15 STRL LF DISP TIS (BLADE) ×1 IMPLANT
BLADE SURG 15 STRL SS (BLADE) ×2
CANISTER SUCTION 2500CC (MISCELLANEOUS) ×2 IMPLANT
CHLORAPREP W/TINT 10.5 ML (MISCELLANEOUS) ×2 IMPLANT
CLIP TI MEDIUM 24 (CLIP) ×2 IMPLANT
CLIP TI WIDE RED SMALL 24 (CLIP) ×2 IMPLANT
COVER SURGICAL LIGHT HANDLE (MISCELLANEOUS) ×2 IMPLANT
CRADLE DONUT ADULT HEAD (MISCELLANEOUS) ×2 IMPLANT
DRAPE LAPAROTOMY 100X72 PEDS (DRAPES) ×2 IMPLANT
DRAPE UTILITY XL STRL (DRAPES) ×2 IMPLANT
ELECT CAUTERY BLADE 6.4 (BLADE) ×2 IMPLANT
ELECT REM PT RETURN 9FT ADLT (ELECTROSURGICAL) ×2
ELECTRODE REM PT RTRN 9FT ADLT (ELECTROSURGICAL) ×1 IMPLANT
GAUZE SPONGE 4X4 12PLY STRL (GAUZE/BANDAGES/DRESSINGS) ×1 IMPLANT
GAUZE SPONGE 4X4 16PLY XRAY LF (GAUZE/BANDAGES/DRESSINGS) ×2 IMPLANT
GLOVE SURG ORTHO 8.0 STRL STRW (GLOVE) ×2 IMPLANT
GOWN STRL REUS W/ TWL LRG LVL3 (GOWN DISPOSABLE) ×1 IMPLANT
GOWN STRL REUS W/ TWL XL LVL3 (GOWN DISPOSABLE) ×1 IMPLANT
GOWN STRL REUS W/TWL LRG LVL3 (GOWN DISPOSABLE) ×2
GOWN STRL REUS W/TWL XL LVL3 (GOWN DISPOSABLE) ×2
HEMOSTAT SURGICEL 2X4 FIBR (HEMOSTASIS) ×2 IMPLANT
ILLUMINATOR WAVEGUIDE N/F (MISCELLANEOUS) ×1 IMPLANT
KIT BASIN OR (CUSTOM PROCEDURE TRAY) ×2 IMPLANT
KIT ROOM TURNOVER OR (KITS) ×2 IMPLANT
NS IRRIG 1000ML POUR BTL (IV SOLUTION) ×2 IMPLANT
PACK SURGICAL SETUP 50X90 (CUSTOM PROCEDURE TRAY) ×2 IMPLANT
PAD ARMBOARD 7.5X6 YLW CONV (MISCELLANEOUS) ×2 IMPLANT
PENCIL BUTTON HOLSTER BLD 10FT (ELECTRODE) ×2 IMPLANT
SHEARS HARMONIC 9CM CVD (BLADE) ×2 IMPLANT
SPECIMEN JAR MEDIUM (MISCELLANEOUS) ×2 IMPLANT
SPONGE GAUZE 4X4 12PLY STER LF (GAUZE/BANDAGES/DRESSINGS) ×1 IMPLANT
SPONGE INTESTINAL PEANUT (DISPOSABLE) ×2 IMPLANT
STRIP CLOSURE SKIN 1/2X4 (GAUZE/BANDAGES/DRESSINGS) ×2 IMPLANT
SUT MNCRL AB 4-0 PS2 18 (SUTURE) ×2 IMPLANT
SUT SILK 2 0 (SUTURE) ×2
SUT SILK 2-0 18XBRD TIE 12 (SUTURE) ×1 IMPLANT
SUT VIC AB 3-0 SH 18 (SUTURE) ×4 IMPLANT
SYR BULB 3OZ (MISCELLANEOUS) ×2 IMPLANT
TAPE CLOTH SURG 4X10 WHT LF (GAUZE/BANDAGES/DRESSINGS) ×1 IMPLANT
TOWEL OR 17X26 10 PK STRL BLUE (TOWEL DISPOSABLE) ×2 IMPLANT
TUBE CONNECTING 12X1/4 (SUCTIONS) ×2 IMPLANT

## 2016-06-30 NOTE — Interval H&P Note (Signed)
History and Physical Interval Note:  06/30/2016 9:49 AM  [name] Curry  has presented today for surgery, with the diagnosis of RECURRENT GRAVES' DISEASE. The various methods of treatment have been discussed with the patient and family. After consideration of risks, benefits and other options for treatment, the patient has consented to    Procedure(s): TOTAL THYROIDECTOMY (N/A) as a surgical intervention .    The patient's history has been reviewed, patient examined, no change in status, stable for surgery.  I have reviewed the patient's chart and labs.  Questions were answered to the patient's satisfaction.    Velora Hecklerodd M. Jaziah Kwasnik, MD, FACS General & Endocrine Surgery [facility] Office: [phone]   Atiya Yera MJudie Petit

## 2016-06-30 NOTE — H&P (Signed)
General Surgery [facility]  [name] Curry 06/13/2016 2:14 PM Location: [facility] Patient #: [id number] DOB: 1981/01/12 Married / Language: English / Race: Black or African American Female   History of Present Illness [name] Heckler MD; 06/13/2016 2:44 PM) The patient is a 36 year old female who presents with Graves disease.  Patient is referred by Dr. Carlus Curry for evaluation and management of recurrent Graves' disease. Patient was originally diagnosed in 18 with Graves' disease or toxic goiter. She was treated with radioactive iodine and became hypothyroid postoperatively. She was placed on levothyroxine. However the patient again developed hyperthyroidism with suppressed TSH levels. Patient did not tolerate methimazole or beta blockade. She is now taking propylthiouracil. He was lost to follow-up for a period of time. After discussion with her endocrinologist, she has made a decision to proceed with total thyroidectomy. Patient wishes to have children in the near future. She does note a mild globus sensation. She has had no prior head or neck surgery. There is a history of Graves' disease and a maternal grandmother. Patient does note tremors but they have improved on medication. She presents today to discuss total thyroidectomy for management.   Past Surgical History [name] Curry, [place]; 06/13/2016 2:14 PM) Hemorrhoidectomy   Allergies [name] Curry, Curry; 06/13/2016 2:16 PM) Atenolol *BETA BLOCKERS*  MethIMAzole *THYROID AGENTS*  Hives. Penicillins  Hives, Nausea, Vomiting. Sulfa Antibiotics  Hives, Nausea, Vomiting.  Medication History [name] Curry, [place]; 06/13/2016 2:17 PM) Propylthiouracil (50MG  Tablet, Oral daily) Active. Medications Reconciled  Social History [name] Curry, [place]; 06/13/2016 2:14 PM) Alcohol use  Occasional alcohol use. Caffeine use  Coffee. Illicit drug use  Prefer to discuss with  provider. Tobacco use  Current every day smoker.  Family History [name] Curry, [place]; 06/13/2016 2:14 PM) Arthritis  Mother. Breast Cancer  Sister. Hypertension  Father, Mother. Migraine Headache  Sister.  Pregnancy / Birth History [name] Curry, [place]; 06/13/2016 2:14 PM) Age at menarche  13 years. Contraceptive History  Oral contraceptives. Gravida  0 Irregular periods  Para  0  Other Problems [name] Curry, [place]; 06/13/2016 2:14 PM) Anxiety Disorder  Asthma  Thyroid Disease     Review of Systems [name] Curry Curry; 06/13/2016 2:14 PM) General Present- Fatigue, Night Sweats and Weight Gain. Not Present- Appetite Loss, Chills, Fever and Weight Loss. Skin Present- Dryness. Not Present- Change in Wart/Mole, Hives, Jaundice, New Lesions, Non-Healing Wounds, Rash and Ulcer. HEENT Present- Hoarseness, Seasonal Allergies and Sore Throat. Not Present- Earache, Hearing Loss, Nose Bleed, Oral Ulcers, Ringing in the Ears, Sinus Pain, Visual Disturbances, Wears glasses/contact lenses and Yellow Eyes. Respiratory Present- Snoring. Not Present- Bloody sputum, Chronic Cough, Difficulty Breathing and Wheezing. Cardiovascular Present- Shortness of Breath. Not Present- Chest Pain, Difficulty Breathing Lying Down, Leg Cramps, Palpitations, Rapid Heart Rate and Swelling of Extremities. Gastrointestinal Present- Bloating and Difficulty Swallowing. Not Present- Abdominal Pain, Bloody Stool, Change in Bowel Habits, Chronic diarrhea, Constipation, Excessive gas, Gets full quickly at meals, Hemorrhoids, Indigestion, Nausea, Rectal Pain and Vomiting. Female Genitourinary Present- Urgency. Not Present- Frequency, Nocturia, Painful Urination and Pelvic Pain. Musculoskeletal Present- Back Pain. Not Present- Joint Pain, Joint Stiffness, Muscle Pain, Muscle Weakness and Swelling of Extremities. Neurological Present- Decreased Memory, Headaches, Numbness, Tingling, Tremor and Weakness. Not  Present- Fainting, Seizures and Trouble walking. Psychiatric Present- Anxiety and Bipolar. Not Present- Change in Sleep Pattern, Depression, Fearful and Frequent crying. Endocrine Present- Hair Changes, Heat Intolerance and Hot flashes. Not Present- Cold Intolerance, Excessive Hunger and New  Diabetes.  Vitals [name] Curry([name] Curry; 06/13/2016 2:17 PM) 06/13/2016 2:17 PM Weight: 194.8 lb Height: 63in Body Surface Area: 1.91 m Body Mass Index: 34.51 kg/m  Temp.: 98.66F  Pulse: 82 (Regular)  BP: 132/80 (Sitting, Left Arm, Standard)       Physical Exam [name] Curry([name] Fountaine M. Gelsey Amyx MD; 06/13/2016 2:44 PM) The physical exam findings are as follows: Note:General - appears comfortable, no distress; not diaphorectic  HEENT - normocephalic; sclerae clear, gaze conjugate; mucous membranes moist, dentition good; voice normal  Neck - symmetric on extension; no palpable anterior or posterior cervical adenopathy; slightly firm, slightly nodular thyroid gland which does not appear to be overly enlarged; no tenderness  Chest - clear bilaterally without rhonchi, rales, or wheeze  Cor - regular rhythm with normal rate; no significant murmur  Ext - non-tender without significant edema or lymphedema  Neuro - grossly intact; mild bilateral tremor    Assessment & Plan [name] Curry([name] Knoll M. Silvanna Ohmer MD; 06/13/2016 2:48 PM) GRAVES DISEASE (E05.00) Current Plans Pt Education - Pamphlet Given - The Thyroid Book: discussed with patient and provided information. Patient presents on referral from her endocrinologist for evaluation for surgery for management of recurrent Graves' disease. Patient is provided with written literature on thyroid surgery to review at home. She is accompanied by her husband.  Patient and I discussed options for management including repeat treatment with radioactive iodine versus proceeding with total or near total thyroidectomy. Patient is interested in having children in the near future and  does not wish to have repeat radioactive iodine treatment which has failed before. Therefore she has opted for total thyroidectomy. We discussed the fact that previous radioactive iodine treatment does increase the difficulty of the thyroidectomy and may make complications more likely. 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 Parathyroid glands are also at risk with a complication rate of less than 3%. We discussed the procedure, the location of the surgical incision, the hospital stay, and the postoperative recovery. We discussed the need for lifelong thyroid hormone replacement. We discussed the need to delay pregnancy for a few months following surgery until her thyroid hormone levels had returned to normal. Patient and her husband understand and agree to proceed with surgery in the near future.  We will arrange for preoperative anesthesia consultation due to the fact that the patient is not on beta blockade.  The risks and benefits of the procedure have been discussed at length with the patient. The patient understands the proposed procedure, potential alternative treatments, and the course of recovery to be expected. All of the patient's questions have been answered at this time. The patient wishes to proceed with surgery.  [name] Hecklerodd M. Kristi Norment, MD, FACS General & Endocrine Surgery [facility] Office: [phone]

## 2016-06-30 NOTE — Anesthesia Procedure Notes (Signed)
Procedure Name: Intubation Date/Time: 06/30/2016 10:13 AM Performed by: Annabelle HarmanSMITH, [name] Hiltz A Pre-anesthesia Checklist: Emergency Drugs available, Suction available, Patient identified and Patient being monitored Patient Re-evaluated:Patient Re-evaluated prior to inductionOxygen Delivery Method: Circle system utilized Preoxygenation: Pre-oxygenation with 100% oxygen Intubation Type: IV induction Ventilation: Mask ventilation without difficulty Laryngoscope Size: Miller and 2 Grade View: Grade II Tube type: Oral Number of attempts: 1 Airway Equipment and Method: Stylet Placement Confirmation: ETT inserted through vocal cords under direct vision,  positive ETCO2 and breath sounds checked- equal and bilateral Secured at: 22 cm Tube secured with: Tape Dental Injury: Teeth and Oropharynx as per pre-operative assessment

## 2016-06-30 NOTE — Transfer of Care (Signed)
Immediate Anesthesia Transfer of Care Note  Patient: [name] Curry  Procedure(s) Performed: Procedure(s): TOTAL THYROIDECTOMY (N/A)  Patient Location: PACU  Anesthesia Type:General  Level of Consciousness: awake, alert , oriented and patient cooperative  Airway & Oxygen Therapy: Patient Spontanous Breathing and Patient connected to face mask oxygen  Post-op Assessment: Report given to RN, Post -op Vital signs reviewed and stable and Patient moving all extremities X 4  Post vital signs: Reviewed and stable  Last Vitals:  Vitals:   06/30/16 0816 06/30/16 1200  BP: (!) 145/98   Pulse: 61   Resp: 18   Temp: 37.2 C (P) 36.1 C    Last Pain: There were no vitals filed for this visit.    Patients Stated Pain Goal: 3 (06/30/16 0816)  Complications: No apparent anesthesia complications

## 2016-06-30 NOTE — Anesthesia Preprocedure Evaluation (Addendum)
Anesthesia Evaluation  Patient identified by MRN, date of birth, ID band Patient awake    Reviewed: Allergy & Precautions, NPO status , Patient's Chart, lab work & pertinent test results  History of Anesthesia Complications Negative for: history of anesthetic complications  Airway Mallampati: II  TM Distance: >3 FB Neck ROM: Full    Dental no notable dental hx. (+) Dental Advisory Given   Pulmonary Current Smoker,    Pulmonary exam normal        Cardiovascular negative cardio ROS Normal cardiovascular exam     Neuro/Psych PSYCHIATRIC DISORDERS Anxiety Bipolar Disorder negative neurological ROS     GI/Hepatic negative GI ROS, Neg liver ROS,   Endo/Other  Hypothyroidism Hyperthyroidism Morbid obesity  Renal/GU negative Renal ROS     Musculoskeletal   Abdominal   Peds  Hematology  (+) Sickle cell trait ,   Anesthesia Other Findings   Reproductive/Obstetrics                            Anesthesia Physical Anesthesia Plan  ASA: III  Anesthesia Plan: General   Post-op Pain Management:    Induction: Intravenous  Airway Management Planned: Oral ETT  Additional Equipment:   Intra-op Plan:   Post-operative Plan: Extubation in OR  Informed Consent: I have reviewed the patients History and Physical, chart, labs and discussed the procedure including the risks, benefits and alternatives for the proposed anesthesia with the patient or authorized representative who has indicated his/her understanding and acceptance.   Dental advisory given  Plan Discussed with: CRNA, Anesthesiologist and Surgeon  Anesthesia Plan Comments:        Anesthesia Quick Evaluation

## 2016-06-30 NOTE — Op Note (Signed)
Procedure Note  Pre-operative Diagnosis:  Recurrent Graves' disease  Post-operative Diagnosis:  same  Surgeon:  [name], MD, FACS  Assistant:  Magnus Ivan, RNFA   Procedure:  Total thyroidectomy  Anesthesia:  General  Estimated Blood Loss:  minimal  Drains: none         Specimen: thyroid to pathology  Indications:  The patient is a 36 year old female who presents with Graves disease.  Patient is referred by Dr. Carlus Pavlov for evaluation and management of recurrent Graves' disease. Patient was originally diagnosed in 100 with Graves' disease or toxic goiter. She was treated with radioactive iodine and became hypothyroid postoperatively. She was placed on levothyroxine. However the patient again developed hyperthyroidism with suppressed TSH levels. Patient did not tolerate methimazole or beta blockade. She is now taking propylthiouracil. He was lost to follow-up for a period of time. After discussion with her endocrinologist, she has made a decision to proceed with total thyroidectomy. Patient wishes to have children in the near future. She does note a mild globus sensation. She has had no prior head or neck surgery. There is a history of Graves' disease and a maternal grandmother. Patient does note tremors but they have improved on medication. She presents today to discuss total thyroidectomy for management.  Procedure Details: Procedure was done in OR #2 at the [facility].  The patient was brought to the operating room and placed in a supine position on the operating room table.  Following administration of general anesthesia, the patient was positioned and then prepped and draped in the usual aseptic fashion.  After ascertaining that an adequate level of anesthesia had been achieved, a Kocher incision was made with #15 blade.  Dissection was carried through subcutaneous tissues and platysma. Hemostasis was achieved with the electrocautery.  Skin flaps  were elevated cephalad and caudad from the thyroid notch to the sternal notch.  The Mahorner self-retaining retractor was placed for exposure.  Strap muscles were incised in the midline and dissection was begun on the left side.  Strap muscles were reflected laterally.  Left thyroid lobe was small and firm.  The left lobe was gently mobilized with blunt dissection.  Superior pole vessels were dissected out and divided individually between small and medium Ligaclips with the Harmonic scalpel.  The thyroid lobe was rolled anteriorly.  Branches of the inferior thyroid artery were divided between small Ligaclips with the Harmonic scalpel.  Inferior venous tributaries were divided between Ligaclips.  Both the superior and inferior parathyroid glands were identified and preserved on their vascular pedicles.  The recurrent laryngeal nerve was identified and preserved along its course.  The ligament of Allyson Sabal was released with the electrocautery and the gland was mobilized onto the anterior trachea. Isthmus was mobilized across the midline.  There was no pyramidal lobe present.  Dry pack was placed in the left neck.  Next, the right thyroid lobe was gently mobilized with blunt dissection.  Right thyroid lobe was normal sized and firm.  Superior pole vessels were dissected out and divided between small and medium Ligaclips with the Harmonic scalpel.  Superior parathyroid was identified and preserved.  Inferior venous tributaries were divided between medium Ligaclips with the Harmonic scalpel.  The right thyroid lobe was rolled anteriorly and the branches of the inferior thyroid artery divided between small Ligaclips.  The right recurrent laryngeal nerve was identified and preserved along its course.  The ligament of Allyson Sabal was released with the electrocautery.  The right  thyroid lobe was mobilized onto the anterior trachea and the remainder of the thyroid was dissected off the anterior trachea and the thyroid was completely  excised.  A suture was used to mark the right lobe. The entire thyroid gland was submitted to pathology for review.  The neck was irrigated with warm saline.  Fibrillar was placed throughout the operative field.  Strap muscles were reapproximated in the midline with interrupted 3-0 Vicryl sutures.  Platysma was closed with interrupted 3-0 Vicryl sutures.  Skin was closed with a running 4-0 Monocryl subcuticular suture.  Wound was washed and dried and steri-strips were applied.  Dry gauze dressing was placed.  The patient was awakened from anesthesia and brought to the recovery room.  The patient tolerated the procedure well.   Velora Hecklerodd M. Bobby Ragan, MD, FACS General & Endocrine Surgery [facility] Office: [phone]

## 2016-06-30 NOTE — Anesthesia Postprocedure Evaluation (Addendum)
Anesthesia Post Note  Patient: [name] Curry  Procedure(s) Performed: Procedure(s) (LRB): TOTAL THYROIDECTOMY (N/A)  Patient location during evaluation: PACU Anesthesia Type: General Level of consciousness: sedated Pain management: pain level controlled Vital Signs Assessment: post-procedure vital signs reviewed and stable Respiratory status: spontaneous breathing and respiratory function stable Cardiovascular status: stable Anesthetic complications: no       Last Vitals:  Vitals:   06/30/16 1215 06/30/16 1230  BP: 139/83 (!) 142/89  Pulse: 87 80  Resp: (!) 25 15  Temp:      Last Pain:  Vitals:   06/30/16 1230  PainSc: Asleep                 Yahye Siebert DANIEL

## 2016-07-01 ENCOUNTER — Encounter (HOSPITAL_COMMUNITY): Payer: Self-pay | Admitting: Surgery

## 2016-07-01 LAB — BASIC METABOLIC PANEL
Anion gap: 9 (ref 5–15)
BUN: 5 mg/dL — AB (ref 6–20)
CHLORIDE: 100 mmol/L — AB (ref 101–111)
CO2: 26 mmol/L (ref 22–32)
Calcium: 9 mg/dL (ref 8.9–10.3)
Creatinine, Ser: 0.91 mg/dL (ref 0.44–1.00)
GFR calc Af Amer: >60 mL/min (ref >60)
GFR calc non Af Amer: >60 mL/min (ref >60)
GLUCOSE: 111 mg/dL — AB (ref 65–99)
POTASSIUM: 3.6 mmol/L (ref 3.5–5.1)
Sodium: 135 mmol/L (ref 135–145)

## 2016-07-01 LAB — CALCIUM: CALCIUM: 9.4 mg/dL (ref 8.9–10.3)

## 2016-07-01 MED ORDER — ONDANSETRON 4 MG PO TBDP: 4.0000 mg | ORAL_TABLET | Freq: Four times a day (QID) | ORAL | 0 refills | Status: DC | PRN

## 2016-07-01 MED ORDER — HYDROCODONE-ACETAMINOPHEN 5-325 MG PO TABS: 1.0000 | ORAL_TABLET | ORAL | 0 refills | Status: DC | PRN

## 2016-07-01 MED ORDER — CALCIUM CARBONATE ANTACID 500 MG PO CHEW: 2.0000 | CHEWABLE_TABLET | Freq: Three times a day (TID) | ORAL | 0 refills | Status: DC

## 2016-07-01 MED ORDER — CALCIUM CARBONATE ANTACID 500 MG PO CHEW: 2.0000 | CHEWABLE_TABLET | Freq: Three times a day (TID) | ORAL | Status: DC

## 2016-07-01 MED ORDER — CALCIUM CARBONATE ANTACID 500 MG PO CHEW: 2.0000 | CHEWABLE_TABLET | Freq: Three times a day (TID) | ORAL | 1 refills | Status: DC

## 2016-07-01 NOTE — Discharge Planning (Signed)
Patient discharged home in stable condition. Verbalizes understanding of all discharge instructions, including home medications and follow up appointments. 

## 2016-07-01 NOTE — Progress Notes (Signed)
Pt c/o severe leg cramps and circumoral tingling. MD notified. Orders received. Will continue to monitor.

## 2016-07-01 NOTE — Discharge Summary (Signed)
Physician Discharge Summary  Patient ID: [name] Curry MRN: [id number] DOB/AGE: March 27, 1981 [age] y.o.  Admit date: 06/30/2016 Discharge date: 07/01/2016  Admission Diagnoses:.goiter   Discharge Diagnoses:  Principal Problem:   Goiter, toxic diffuse Active Problems:   Graves' disease   Discharged Condition: good  Hospital Course: Pt did well.  She tolerated her diet and had good pain control.  Post op calcium was 9.  She had no hoarseness.     Significant Diagnostic Studies:  CMP Latest Ref Rng & Units 07/01/2016 05/01/2016 10/19/2015  Glucose 65 - 99 mg/dL 409(W111(H) 99 88  BUN 6 - 20 mg/dL 5(L) 7 6  Creatinine 1.190.44 - 1.00 mg/dL 1.470.91 8.290.69 5.620.74  Sodium 135 - 145 mmol/L 135 138 135  Potassium 3.5 - 5.1 mmol/L 3.6 4.2 3.9  Chloride 101 - 111 mmol/L 100(L) 105 104  CO2 22 - 32 mmol/L 26 25 25   Calcium 8.9 - 10.3 mg/dL 9.0 8.9 9.1  Total Protein 6.1 - 8.1 g/dL - 6.5 -  Total Bilirubin 0.2 - 1.2 mg/dL - 0.4 -  Alkaline Phos 33 - 115 U/L - 113 -  AST 10 - 30 U/L - 16 -  ALT 6 - 29 U/L - 20 -    Treatments: surgery: total thyroidectomy   Discharge Exam: Blood pressure 123/83, pulse 66, temperature 98.2 F (36.8 C), temperature source Oral, resp. rate 18, last menstrual period 06/06/2016, SpO2 100 %. General appearance: alert and cooperative Neck: incision CDI  no hematoma  Resp: clear to auscultation bilaterally  Disposition: 01-Home or Self Care     Follow-up Information    GERKIN,TODD M, MD. Schedule an appointment as soon as possible for a visit in 3 week(s).   Specialty:  General Surgery Contact information: [address] [place] [address] [phone]           Signed: Dortha SchwalbeCORNETT,Destiney Sanabia A. 07/01/2016, 8:21 AM

## 2016-07-01 NOTE — Discharge Instructions (Signed)
[facility] ° °THYROID & PARATHYROID SURGERY:  POST-OP INSTRUCTIONS ° °Always review your discharge instruction sheet from the facility where your surgery was performed. ° °A prescription for pain medication may be given to you upon discharge.  Take your pain medication as prescribed.  If narcotic pain medicine is not needed, then you may take acetaminophen (Tylenol) or ibuprofen (Advil) as needed. ° °Take your usually prescribed medications unless otherwise directed. ° °If you need a refill on your pain medication, please contact our office during regular business hours.  Prescriptions will not be processed by our office after 5 pm or on weekends. ° °Start with a light diet upon arrival home, such as soup and crackers or toast.  Be sure to drink plenty of fluids daily.  Resume your normal diet the day after surgery. ° °Most patients will experience some swelling and bruising on the chest and neck area.  Ice packs will help.  Swelling and bruising can take several days to resolve.  ° °It is common to experience some constipation after surgery.  Increasing fluid intake and taking a stool softener (Colace) will usually help or prevent this problem.  A mild laxative (Milk of Magnesia or Miralax) should be taken according to package directions if there has been no bowel movement after 48 hours. ° °You have steri-strips and a gauze dressing over your incision.  You may remove the gauze bandage on the second day after surgery, and you may shower at that time.  Leave your steri-strips (small skin tapes) in place directly over the incision.  These strips should remain on the skin for 5-7 days and then be removed.  You may get them wet in the shower and pat them dry. ° °You may resume regular (light) daily activities beginning the next day - such as daily self-care, walking, climbing stairs - gradually increasing activities as tolerated.  You may have sexual intercourse when it is comfortable.  Refrain  from any heavy lifting or straining until approved by your doctor.  You may drive when you no longer are taking prescription pain medication, you can comfortably wear a seatbelt, and you can safely maneuver your car and apply brakes. ° °You should see your doctor in the office for a follow-up appointment approximately three weeks after your surgery.  Make sure that you call for this appointment within a day or two after you arrive home to insure a convenient appointment time. ° °WHEN TO CALL YOUR DOCTOR: °-- Fever greater than 101.5 °-- Inability to urinate °-- Nausea and/or vomiting - persistent °-- Extreme swelling or bruising °-- Continued bleeding from incision °-- Increased pain, redness, or drainage from the incision °-- Difficulty swallowing or breathing °-- Muscle cramping or spasms °-- Numbness or tingling in hands or around lips ° °The clinic staff is available to answer your questions during regular business hours.  Please don’t hesitate to call and ask to speak to one of the nurses if you have concerns. ° °Lyriq Finerty M. Durand Wittmeyer, MD, FACS °General & Endocrine Surgery °[facility] °Office: [phone] ° °Website: [email or web address] ° ° °

## 2016-07-01 NOTE — Progress Notes (Signed)
1 Day Post-Op  Subjective: Doing well no complaints   Not hoarse   Objective: Vital signs in last 24 hours: Temp:  [97 F (36.1 C)-98.2 F (36.8 C)] 98.2 F (36.8 C) (02/10 0501) Pulse Rate:  [66-97] 66 (02/10 0501) Resp:  [15-29] 18 (02/10 0501) BP: (120-159)/(72-92) 123/83 (02/10 0501) SpO2:  [90 %-100 %] 100 % (02/10 0501) Last BM Date: 06/29/16  Intake/Output from previous day: 02/09 0701 - 02/10 0700 In: 1220 [P.O.:120; I.V.:1100] Out: 705 [Urine:700; Blood:5] Intake/Output this shift: No intake/output data recorded.  Incision/Wound:CDI  Soft no hematoma   Lab Results:  No results for input(s): WBC, HGB, HCT, PLT in the last 72 hours. BMET  Recent Labs  07/01/16 0444  NA 135  K 3.6  CL 100*  CO2 26  GLUCOSE 111*  BUN 5*  CREATININE 0.91  CALCIUM 9.0   PT/INR No results for input(s): LABPROT, INR in the last 72 hours. ABG No results for input(s): PHART, HCO3 in the last 72 hours.  Invalid input(s): PCO2, PO2  Studies/Results: No results found.  Anti-infectives: Anti-infectives    Start     Dose/Rate Route Frequency Ordered Stop   06/30/16 0930  ciprofloxacin (CIPRO) IVPB 400 mg     400 mg 200 mL/hr over 60 Minutes Intravenous To [facility] 06/29/16 1000 06/30/16 1047      Assessment/Plan: s/p Procedure(s): TOTAL THYROIDECTOMY (N/A) Discharge  LOS: 0 days    Isaak Delmundo A. 07/01/2016

## 2016-07-04 NOTE — Progress Notes (Signed)
Please contact patient and notify of benign pathology results.  Silvino Selman M. Laretha Luepke, MD, FACS [facility] Office: [phone]   

## 2016-07-05 ENCOUNTER — Encounter: Payer: Self-pay | Admitting: Internal Medicine

## 2016-07-06 ENCOUNTER — Other Ambulatory Visit: Payer: Self-pay | Admitting: Internal Medicine

## 2016-07-06 MED ORDER — LEVOTHYROXINE SODIUM 125 MCG PO TABS: 125.0000 ug | ORAL_TABLET | Freq: Every day | ORAL | 1 refills | Status: DC

## 2016-07-11 ENCOUNTER — Encounter: Payer: Self-pay | Admitting: Internal Medicine

## 2016-07-21 ENCOUNTER — Encounter (HOSPITAL_COMMUNITY): Payer: Self-pay | Admitting: Emergency Medicine

## 2016-07-21 ENCOUNTER — Emergency Department (HOSPITAL_COMMUNITY)
Admission: EM | Admit: 2016-07-21 | Discharge: 2016-07-22 | Disposition: A | Discharge status: Home / Self Care | Payer: BLUE CROSS/BLUE SHIELD | Attending: Emergency Medicine | Admitting: Emergency Medicine

## 2016-07-21 ENCOUNTER — Emergency Department (HOSPITAL_COMMUNITY): Payer: BLUE CROSS/BLUE SHIELD

## 2016-07-21 DIAGNOSIS — E039 Hypothyroidism, unspecified: Secondary | ICD-10-CM | POA: Insufficient documentation

## 2016-07-21 DIAGNOSIS — F17210 Nicotine dependence, cigarettes, uncomplicated: Secondary | ICD-10-CM | POA: Insufficient documentation

## 2016-07-21 DIAGNOSIS — Z7982 Long term (current) use of aspirin: Secondary | ICD-10-CM | POA: Insufficient documentation

## 2016-07-21 DIAGNOSIS — Z79899 Other long term (current) drug therapy: Secondary | ICD-10-CM | POA: Insufficient documentation

## 2016-07-21 DIAGNOSIS — R0789 Other chest pain: Secondary | ICD-10-CM | POA: Insufficient documentation

## 2016-07-21 DIAGNOSIS — R091 Pleurisy: Secondary | ICD-10-CM

## 2016-07-21 MED ORDER — ALBUTEROL SULFATE (2.5 MG/3ML) 0.083% IN NEBU: 5.0000 mg | INHALATION_SOLUTION | Freq: Once | RESPIRATORY_TRACT | Status: DC

## 2016-07-21 MED ORDER — KETOROLAC TROMETHAMINE 30 MG/ML IJ SOLN
30.0000 mg | Freq: Once | INTRAMUSCULAR | Status: AC
  Administered 2016-07-22: 30 mg via INTRAVENOUS
  Filled 2016-07-21: qty 1

## 2016-07-21 NOTE — ED Triage Notes (Signed)
Pt c/o SOB that began 2 days ago; pt had full thyroid removal 3 weeks ago; denies cough; SpO2 100% on RA; pt also notes sharp pain behind her right shoulder

## 2016-07-21 NOTE — ED Provider Notes (Signed)
[facility]-EMERGENCY DEPT Provider Note   CSN: [id number] Arrival date & time: 07/21/16  2030  By signing my name below, I, Elder NegusRussell Johnston, attest that this documentation has been prepared under the direction and in the presence of Arby BarretteMarcy Eliannah Hinde, MD. Electronically Signed: Elder Negusussell Johnston, Scribe. 07/22/16. 3:38 AM.   History   Chief Complaint Chief Complaint  Patient presents with  . Shortness of Breath    HPI [name] Curry is a 36 y.o. female with history of Graves disease s/p complete thyroidectomy on 2/9 who presents to the ED for evaluation of shoulder pain. This patient states that for the last 3-4 days she has experienced intermittent, R scapular pain which is sharp and worse with deep respiration, movement. She is taking oxycodone and Advil at home which is not improving her symptoms. She denies any peripheral edema or calf pain. No fevers or chills. She is reporting mild dyspnea since last night. No abdominal pain. No rashes. No sore throat or congestion. No urinary complaints.   The history is provided by the patient. No language interpreter was used.    Past Medical History:  Diagnosis Date  . Anemia   . Anxiety   . Asthma   . Bipolar disorder (HCC)   . Depression   . Graves disease   . Graves disease   . Hypothyroidism 06/30/2016  . Sickle cell trait (HCC)   . Thyroid disease    was hyperthyroid, had radiaton in April 2015, now hyperthyroid    Patient Active Problem List   Diagnosis Date Noted  . Graves' disease 06/30/2016  . Breast lump 04/21/2014  . Tobacco abuse 04/21/2014  . Routine general medical examination at a health care facility 04/21/2014  . 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 Suicidal behavior 08/05/2011    Past Surgical History:  Procedure Laterality Date  . COLPOSCOPY    . EXCISIONAL HEMORRHOIDECTOMY  1996  . THYROIDECTOMY N/A 06/30/2016   Procedure: TOTAL THYROIDECTOMY;  Surgeon: Darnell Levelodd  Gerkin, MD;  [facility];  Service: General;  Laterality: N/A;  . TOTAL THYROIDECTOMY  06/30/2016    OB History    No data available       Home Medications    Prior to Admission medications   Medication Sig Start Date End Date Taking? Authorizing Provider  acetaminophen (TYLENOL) 500 MG tablet Take 1,000 mg by mouth every 6 (six) hours as needed (for pain.).   Yes Historical Provider, MD  Aspirin-Salicylamide-Caffeine (BC FAST PAIN RELIEF) 650-195-33.3 MG PACK Take 1 packet by mouth every 8 (eight) hours as needed (for pain).   Yes Historical Provider, MD  calcium carbonate (TUMS - DOSED IN MG ELEMENTAL CALCIUM) 500 MG chewable tablet Chew 2 tablets (400 mg of elemental calcium total) by mouth 3 (three) times daily. 07/01/16  Yes Harriette Bouillonhomas Cornett, MD  levothyroxine (SYNTHROID) 125 MCG tablet Take 1 tablet (125 mcg total) by mouth daily before breakfast. 07/06/16  Yes Carlus Pavlovristina Gherghe, MD  loratadine (CLARITIN) 10 MG tablet Take 10 mg by mouth daily as needed for allergies.   Yes Historical Provider, MD  calcium carbonate (TUMS) 500 MG chewable tablet Chew 2 tablets (400 mg of elemental calcium total) by mouth 3 (three) times daily. Patient not taking: Reported on 07/21/2016 07/01/16   Darnell Levelodd Gerkin, MD  HYDROcodone-acetaminophen (NORCO/VICODIN) 5-325 MG tablet Take 1-2 tablets by mouth every 4 (four) hours as needed for moderate pain. Patient not taking: Reported on 07/21/2016 07/01/16   Harriette Bouillonhomas Cornett, MD  methocarbamol (ROBAXIN) 500 MG tablet Take 1 tablet (500 mg total) by mouth 2 (two) times daily. 07/22/16   Arby Barrette, MD  naproxen (NAPROSYN) 500 MG tablet Take 1 tablet (500 mg total) by mouth 2 (two) times daily. 07/22/16   Arby Barrette, MD  ondansetron (ZOFRAN-ODT) 4 MG disintegrating tablet Take 1 tablet (4 mg total) by mouth every 6 (six) hours as needed for nausea. Patient not taking: Reported on 07/21/2016 07/01/16   Harriette Bouillon, MD    Family History Family History  Problem  Relation Age of Onset  . Arthritis Mother   . Hyperlipidemia Father   . Diabetes Other     Social History Social History  Substance Use Topics  . Smoking status: Current Every Day Smoker    Packs/day: 0.50    Years: 17.00    Types: Cigars, Cigarettes  . Smokeless tobacco: Never Used  . Alcohol use Yes     Comment: 06/30/2016 "a beer q couple weeks"     Allergies   Atenolol; Methimazole; Penicillins; and Sulfa antibiotics   Review of Systems Review of Systems 10 systems reviewed and all are negative for acute change except as noted in the HPI.  Physical Exam Updated Vital Signs BP 120/74   Pulse 64   Temp 97.9 F (36.6 C) (Oral)   Resp 17   Ht 5\' 3"  (1.6 m)   Wt 202 lb 3.2 oz (91.7 kg)   LMP 07/01/2016 (Approximate)   SpO2 98%   BMI 35.82 kg/m   Physical Exam  Constitutional: She is oriented to person, place, and time. She appears well-developed and well-nourished.  HENT:  Head: Normocephalic and atraumatic.  Eyes: Conjunctivae and EOM are normal. Pupils are equal, round, and reactive to light.  Neck: Normal range of motion. Neck supple.  Well healing thyroidectomy scar without swelling.   Cardiovascular: Normal rate, regular rhythm and normal heart sounds.  Exam reveals no gallop.   No murmur heard. Pulmonary/Chest: Effort normal and breath sounds normal. She has no wheezes. She has no rhonchi. She has no rales.  Abdominal: Soft.  Mild diffuse tenderness throughout without guarding or rebound.  Musculoskeletal: Normal range of motion.  No peripheral edema. No calf tenderness. Severe tenderness to the R medial scapular wing. No masses or soft tissue abnormalities.   Neurological: She is alert and oriented to person, place, and time. No cranial nerve deficit. She exhibits normal muscle tone. Coordination normal.  Psychiatric: She has a normal mood and affect. Her behavior is normal.     ED Treatments / Results  DIAGNOSTIC STUDIES: Oxygen Saturation is 100  percent on room air which is normal by my interpretation.    COORDINATION OF CARE: 11:30 PM Discussed treatment plan with pt at bedside and pt agreed to plan.  Labs (all labs ordered are listed, but only abnormal results are displayed) Labs Reviewed  CBC WITH DIFFERENTIAL/PLATELET - Abnormal; Notable for the following:       Result Value   WBC 11.2 (*)    RBC 5.17 (*)    MCV 73.1 (*)    MCH 25.0 (*)    Lymphs Abs 6.5 (*)    All other components within normal limits  COMPREHENSIVE METABOLIC PANEL  LIPASE, BLOOD  D-DIMER, QUANTITATIVE (NOT AT [facility])  URINALYSIS, ROUTINE W REFLEX MICROSCOPIC  CALCIUM, IONIZED  I-STAT BETA HCG BLOOD, ED ([facility], [facility], [facility] ONLY)    EKG  EKG Interpretation None       Radiology Dg Chest 2 View  Result Date: 07/21/2016 CLINICAL DATA:  Shortness of breath for 2 days. EXAM: CHEST  2 VIEW COMPARISON:  06/27/2016 FINDINGS: The cardiomediastinal contours are normal. Mild bronchial thickening. Pulmonary vasculature is normal. No consolidation, pleural effusion, or pneumothorax. Surgical clips at the thoracic inlet, post thyroidectomy. No acute osseous abnormalities are seen. IMPRESSION: Mild bronchial thickening. Electronically Signed   By: Rubye Oaks M.D.   On: 07/21/2016 21:28   Ct Angio Chest Pe W/cm &/or Wo Cm  Result Date: 07/22/2016 CLINICAL DATA:  Acute onset shortness of breath. Sharp pain behind the right shoulder. Recent thyroidectomy. Initial encounter. EXAM: CT ANGIOGRAPHY CHEST WITH CONTRAST TECHNIQUE: Multidetector CT imaging of the chest was performed using the standard protocol during bolus administration of intravenous contrast. Multiplanar CT image reconstructions and MIPs were obtained to evaluate the vascular anatomy. CONTRAST:  100 mL of Isovue 370 IV contrast COMPARISON:  Chest radiograph performed 07/21/2016, and CT of the chest performed 06/03/2013 FINDINGS: Cardiovascular:  There is no evidence of pulmonary embolus. The heart is normal in  size. The thoracic aorta is unremarkable in appearance. No calcific atherosclerotic disease is seen. The great vessels are grossly unremarkable. Mediastinum/Nodes: No mediastinal lymphadenopathy is seen. No pericardial effusion is identified. The patient is status post thyroidectomy. The thyroid gland is grossly unremarkable in appearance. No axillary lymphadenopathy is seen. Lungs/Pleura: The lungs are clear bilaterally. No focal consolidation, pleural effusion or pneumothorax is seen. No masses are identified. Upper Abdomen: The visualized portions of the liver and spleen are grossly unremarkable. The visualized portions of the adrenal glands are within normal limits. Musculoskeletal: No acute osseous abnormalities are identified. The visualized musculature is unremarkable in appearance. Review of the MIP images confirms the above findings. IMPRESSION: No evidence of pulmonary embolus.  Lungs clear bilaterally. Electronically Signed   By: Roanna Raider M.D.   On: 07/22/2016 03:21    Procedures Procedures (including critical care time)  Medications Ordered in ED Medications  iopamidol (ISOVUE-370) 76 % injection (not administered)  naproxen (NAPROSYN) tablet 500 mg (not administered)  methocarbamol (ROBAXIN) tablet 1,000 mg (not administered)  ketorolac (TORADOL) 30 MG/ML injection 30 mg (30 mg Intravenous Given 07/22/16 0023)  iopamidol (ISOVUE-370) 76 % injection 100 mL (100 mLs Intravenous Contrast Given 07/22/16 0251)     Initial Impression / Assessment and Plan / ED Course  I have reviewed the triage vital signs and the nursing notes.  Pertinent labs & imaging results that were available during my care of the patient were reviewed by me and considered in my medical decision making (see chart for details).      Final Clinical Impressions(s) / ED Diagnoses   Final diagnoses:  Pleurisy  Right-sided chest wall pain  Patient is status post thyroidectomy which is healing well. She has  subsequently developed right posterior thoracic pain which was pleuritic in quality with associated shortness of breath. No fever or cough. CT PE study rules out for pulmonary embolus or other anomaly. The patient does have significantly reproducible posterior chest wall pain as well and most suspect musculoskeletal etiology versus pleurisy. Patient got improved pain control with Toradol. I will have her continue anti-inflammatory naproxen twice daily and Robaxin for muscle relaxer.  New Prescriptions New Prescriptions   METHOCARBAMOL (ROBAXIN) 500 MG TABLET    Take 1 tablet (500 mg total) by mouth 2 (two) times daily.   NAPROXEN (NAPROSYN) 500 MG TABLET    Take 1 tablet (500 mg total) by mouth 2 (two) times daily.  Arby Barrette, MD 07/22/16 [phone]

## 2016-07-22 ENCOUNTER — Encounter (HOSPITAL_COMMUNITY): Payer: Self-pay | Admitting: Radiology

## 2016-07-22 ENCOUNTER — Emergency Department (HOSPITAL_COMMUNITY): Payer: BLUE CROSS/BLUE SHIELD

## 2016-07-22 LAB — COMPREHENSIVE METABOLIC PANEL
ALK PHOS: 107 U/L (ref 38–126)
ALT: 36 U/L (ref 14–54)
AST: 25 U/L (ref 15–41)
Albumin: 4.5 g/dL (ref 3.5–5.0)
Anion gap: 6 (ref 5–15)
BILIRUBIN TOTAL: 0.5 mg/dL (ref 0.3–1.2)
BUN: 13 mg/dL (ref 6–20)
CALCIUM: 9.8 mg/dL (ref 8.9–10.3)
CO2: 25 mmol/L (ref 22–32)
CREATININE: 0.88 mg/dL (ref 0.44–1.00)
Chloride: 104 mmol/L (ref 101–111)
GFR calc Af Amer: >60 mL/min (ref >60)
GFR calc non Af Amer: >60 mL/min (ref >60)
Glucose, Bld: 93 mg/dL (ref 65–99)
POTASSIUM: 3.8 mmol/L (ref 3.5–5.1)
Sodium: 135 mmol/L (ref 135–145)
TOTAL PROTEIN: 7.9 g/dL (ref 6.5–8.1)

## 2016-07-22 LAB — URINALYSIS, ROUTINE W REFLEX MICROSCOPIC
BILIRUBIN URINE: NEGATIVE
GLUCOSE, UA: NEGATIVE mg/dL
Hgb urine dipstick: NEGATIVE
Ketones, ur: NEGATIVE mg/dL
LEUKOCYTES UA: NEGATIVE
NITRITE: NEGATIVE
PH: 7 (ref 5.0–8.0)
Protein, ur: NEGATIVE mg/dL
SPECIFIC GRAVITY, URINE: 1.026 (ref 1.005–1.030)

## 2016-07-22 LAB — CBC WITH DIFFERENTIAL/PLATELET
BASOS ABS: 0 10*3/uL (ref 0.0–0.1)
BASOS PCT: 0 %
EOS ABS: 0.1 10*3/uL (ref 0.0–0.7)
Eosinophils Relative: 1 %
HCT: 37.8 % (ref 36.0–46.0)
HEMOGLOBIN: 12.9 g/dL (ref 12.0–15.0)
LYMPHS ABS: 6.5 10*3/uL — AB (ref 0.7–4.0)
LYMPHS PCT: 58 %
MCH: 25 pg — AB (ref 26.0–34.0)
MCHC: 34.1 g/dL (ref 30.0–36.0)
MCV: 73.1 fL — ABNORMAL LOW (ref 78.0–100.0)
Monocytes Absolute: 0.7 10*3/uL (ref 0.1–1.0)
Monocytes Relative: 6 %
NEUTROS ABS: 3.9 10*3/uL (ref 1.7–7.7)
Neutrophils Relative %: 35 %
Platelets: 331 10*3/uL (ref 150–400)
RBC: 5.17 MIL/uL — ABNORMAL HIGH (ref 3.87–5.11)
RDW: 14.8 % (ref 11.5–15.5)
WBC: 11.2 10*3/uL — ABNORMAL HIGH (ref 4.0–10.5)

## 2016-07-22 LAB — D-DIMER, QUANTITATIVE (NOT AT ARMC): D DIMER QUANT: 0.47 ug{FEU}/mL (ref 0.00–0.50)

## 2016-07-22 LAB — I-STAT BETA HCG BLOOD, ED (MC, WL, AP ONLY): I-stat hCG, quantitative: <5 m[IU]/mL (ref <5)

## 2016-07-22 LAB — LIPASE, BLOOD: LIPASE: 24 U/L (ref 11–51)

## 2016-07-22 MED ORDER — METHOCARBAMOL 500 MG PO TABS
1000.0000 mg | ORAL_TABLET | Freq: Once | ORAL | Status: AC
  Administered 2016-07-22: 1000 mg via ORAL
  Filled 2016-07-22: qty 2

## 2016-07-22 MED ORDER — IOPAMIDOL (ISOVUE-370) INJECTION 76%
INTRAVENOUS | Status: AC
  Filled 2016-07-22: qty 100

## 2016-07-22 MED ORDER — NAPROXEN 500 MG PO TABS: 500.0000 mg | ORAL_TABLET | Freq: Two times a day (BID) | ORAL | 0 refills | Status: DC

## 2016-07-22 MED ORDER — IOPAMIDOL (ISOVUE-370) INJECTION 76%
100.0000 mL | Freq: Once | INTRAVENOUS | Status: AC | PRN
  Administered 2016-07-22: 100 mL via INTRAVENOUS

## 2016-07-22 MED ORDER — NAPROXEN 500 MG PO TABS
500.0000 mg | ORAL_TABLET | Freq: Once | ORAL | Status: AC
  Administered 2016-07-22: 500 mg via ORAL
  Filled 2016-07-22: qty 1

## 2016-07-22 MED ORDER — METHOCARBAMOL 500 MG PO TABS: 500.0000 mg | ORAL_TABLET | Freq: Two times a day (BID) | ORAL | 0 refills | Status: DC

## 2016-07-23 LAB — CALCIUM, IONIZED: Calcium, Ionized, Serum: 5.2 mg/dL (ref 4.5–5.6)

## 2016-08-01 ENCOUNTER — Ambulatory Visit (HOSPITAL_COMMUNITY)
Admission: EM | Admit: 2016-08-01 | Discharge: 2016-08-01 | Disposition: A | Discharge status: Home / Self Care | Payer: BLUE CROSS/BLUE SHIELD | Attending: Internal Medicine | Admitting: Internal Medicine

## 2016-08-01 ENCOUNTER — Ambulatory Visit: Payer: Self-pay | Admitting: Internal Medicine

## 2016-08-01 ENCOUNTER — Encounter (HOSPITAL_COMMUNITY): Payer: Self-pay | Admitting: Emergency Medicine

## 2016-08-01 DIAGNOSIS — K047 Periapical abscess without sinus: Secondary | ICD-10-CM | POA: Diagnosis not present

## 2016-08-01 MED ORDER — HYDROCODONE-ACETAMINOPHEN 5-325 MG PO TABS: 1.0000 | ORAL_TABLET | Freq: Four times a day (QID) | ORAL | 0 refills | Status: DC | PRN

## 2016-08-01 MED ORDER — IBUPROFEN 800 MG PO TABS: 800.0000 mg | ORAL_TABLET | Freq: Three times a day (TID) | ORAL | 0 refills | Status: DC

## 2016-08-01 MED ORDER — CLINDAMYCIN HCL 150 MG PO CAPS: 150.0000 mg | ORAL_CAPSULE | Freq: Three times a day (TID) | ORAL | 0 refills | Status: DC

## 2016-08-01 NOTE — Discharge Instructions (Addendum)
You have infected dental abscess. I prescribed clindamycin to treat ear infection, take one tablet by mouth 3 times a day. For pain I prescribed ibuprofen 800 mg, take one tablet every 8 hours around-the-clock. I have also prescribed a medicine for pain called hydrocodone, this medicine is a narcotic, it will cause drowsiness, and it is addictive. Do not take more than what is necessary, do not drink alcohol while taking, and do not operate any heavy machinery while taking this medicine. You will need to follow up with a dentist for treatment of the tooth, or your pain will return.

## 2016-08-01 NOTE — ED Triage Notes (Signed)
The patient presented to the [facility] with a complaint of dental pain x 1 week. 

## 2016-08-01 NOTE — ED Provider Notes (Signed)
CSN: [id number]     Arrival date & time 08/01/16  1430 History   First MD Initiated Contact with Patient 08/01/16 1659     Chief Complaint  Patient presents with  . Dental Pain   (Consider location/radiation/quality/duration/timing/severity/associated sxs/prior Treatment) 36 year old female presents to clinic for evaluation of dental pain. States she's had significant pain throughout most of the week, as well as facial swelling. She is planning on seeing a dentist after her next paycheck for extraction she has had no fever, nausea, or other markers of any systemic illness. She has no headache, blurred vision, or other symptoms. She denies any possibility of pregnancy, stating she is currently on her period now.   The history is provided by the patient.  Dental Pain    Past Medical History:  Diagnosis Date  . Anemia   . Anxiety   . Asthma   . Bipolar disorder (HCC)   . Depression   . Graves disease   . Graves disease   . Hypothyroidism 06/30/2016  . Sickle cell trait (HCC)   . Thyroid disease    was hyperthyroid, had radiaton in April 2015, now hyperthyroid   Past Surgical History:  Procedure Laterality Date  . COLPOSCOPY    . EXCISIONAL HEMORRHOIDECTOMY  1996  . THYROIDECTOMY N/A 06/30/2016   Procedure: TOTAL THYROIDECTOMY;  Surgeon: Darnell Levelodd Gerkin, MD;  [facility];  Service: General;  Laterality: N/A;  . TOTAL THYROIDECTOMY  06/30/2016   Family History  Problem Relation Age of Onset  . Arthritis Mother   . Hyperlipidemia Father   . Diabetes Other    Social History  Substance Use Topics  . Smoking status: Current Every Day Smoker    Packs/day: 0.50    Years: 17.00    Types: Cigars, Cigarettes  . Smokeless tobacco: Never Used  . Alcohol use Yes     Comment: 06/30/2016 "a beer q couple weeks"   OB History    No data available     Review of Systems  Reason unable to perform ROS: as covered in HPI.  All other systems reviewed and are negative.   Allergies    Atenolol; Methimazole; Penicillins; and Sulfa antibiotics  Home Medications   Prior to Admission medications   Medication Sig Start Date End Date Taking? Authorizing Provider  acetaminophen (TYLENOL) 500 MG tablet Take 1,000 mg by mouth every 6 (six) hours as needed (for pain.).    Historical Provider, MD  Aspirin-Salicylamide-Caffeine (BC FAST PAIN RELIEF) 650-195-33.3 MG PACK Take 1 packet by mouth every 8 (eight) hours as needed (for pain).    Historical Provider, MD  calcium carbonate (TUMS - DOSED IN MG ELEMENTAL CALCIUM) 500 MG chewable tablet Chew 2 tablets (400 mg of elemental calcium total) by mouth 3 (three) times daily. 07/01/16   Harriette Bouillonhomas Cornett, MD  clindamycin (CLEOCIN) 150 MG capsule Take 1 capsule (150 mg total) by mouth 3 (three) times daily. 08/01/16   Dorena BodoLawrence Nyellie Yetter, NP  HYDROcodone-acetaminophen (NORCO/VICODIN) 5-325 MG tablet Take 1 tablet by mouth every 6 (six) hours as needed for severe pain. 08/01/16   Dorena BodoLawrence Jettie Mannor, NP  ibuprofen (ADVIL,MOTRIN) 800 MG tablet Take 1 tablet (800 mg total) by mouth 3 (three) times daily. 08/01/16   Dorena BodoLawrence Hakop Humbarger, NP  levothyroxine (SYNTHROID) 125 MCG tablet Take 1 tablet (125 mcg total) by mouth daily before breakfast. 07/06/16   Carlus Pavlovristina Gherghe, MD  loratadine (CLARITIN) 10 MG tablet Take 10 mg by mouth daily as needed for allergies.    Historical  Provider, MD  methocarbamol (ROBAXIN) 500 MG tablet Take 1 tablet (500 mg total) by mouth 2 (two) times daily. 07/22/16   Arby Barrette, MD  naproxen (NAPROSYN) 500 MG tablet Take 1 tablet (500 mg total) by mouth 2 (two) times daily. 07/22/16   Arby Barrette, MD   Meds Ordered and Administered this Visit  Medications - No data to display  BP 131/73 (BP Location: Right Arm)   Pulse 72   Temp 98.3 F (36.8 C) (Oral)   Resp 18   SpO2 100%  No data found.   Physical Exam  Constitutional: She is oriented to person, place, and time. She appears well-developed and well-nourished. No  distress.  HENT:  Head: Normocephalic and atraumatic.  Right Ear: External ear normal.  Left Ear: External ear normal.  Mouth/Throat: Uvula is midline, oropharynx is clear and moist and mucous membranes are normal. Tonsils are 0 on the right. Tonsils are 0 on the left. No tonsillar exudate.    Neck: Normal range of motion. Neck supple. No JVD present.  Cardiovascular: Normal rate.   Pulmonary/Chest: Effort normal and breath sounds normal.  Lymphadenopathy:    She has no cervical adenopathy.  Neurological: She is alert and oriented to person, place, and time.  Skin: Skin is warm and dry. Capillary refill takes less than 2 seconds. She is not diaphoretic.  Psychiatric: She has a normal mood and affect. Her behavior is normal.  Nursing note and vitals reviewed.   Urgent Care Course     Procedures (including critical care time)  Labs Review Labs Reviewed - No data to display  Imaging Review No results found.    MDM   1. Dental abscess    You have infected dental abscess. I prescribed clindamycin to treat ear infection, take one tablet by mouth 3 times a day. For pain I prescribed ibuprofen 800 mg, take one tablet every 8 hours around-the-clock. I have also prescribed a medicine for pain called hydrocodone, this medicine is a narcotic, it will cause drowsiness, and it is addictive. Do not take more than what is necessary, do not drink alcohol while taking, and do not operate any heavy machinery while taking this medicine. You will need to follow up with a dentist for treatment of the tooth, or your pain will return.      Dorena Bodo, NP 08/01/16 1750

## 2016-08-10 ENCOUNTER — Ambulatory Visit: Payer: Self-pay | Admitting: Internal Medicine

## 2016-08-11 ENCOUNTER — Telehealth: Payer: Self-pay | Admitting: Internal Medicine

## 2016-08-11 NOTE — Telephone Encounter (Signed)
3mo

## 2016-08-11 NOTE — Telephone Encounter (Signed)
Patient no showed today's appt. Please advise on how to follow up. °A. No follow up necessary. °B. Follow up urgent. Contact patient immediately. °C. Follow up necessary. Contact patient and schedule visit in ___ days. °D. Follow up advised. Contact patient and schedule visit in ____weeks. ° °

## 2016-08-16 NOTE — Telephone Encounter (Signed)
Lm for patient to call and reschedule.pb °

## 2016-09-05 ENCOUNTER — Encounter: Payer: Self-pay | Admitting: Internal Medicine

## 2016-09-05 ENCOUNTER — Ambulatory Visit (INDEPENDENT_AMBULATORY_CARE_PROVIDER_SITE_OTHER): Payer: Self-pay | Admitting: Internal Medicine

## 2016-09-05 VITALS — BP 132/72 | HR 70 | Temp 98.0°F | Wt 197.6 lb

## 2016-09-05 DIAGNOSIS — E890 Postprocedural hypothyroidism: Secondary | ICD-10-CM

## 2016-09-05 DIAGNOSIS — Z8639 Personal history of other endocrine, nutritional and metabolic disease: Secondary | ICD-10-CM

## 2016-09-05 NOTE — Patient Instructions (Addendum)
Please continue Levothyroxine 125 mcg daily.  Take the thyroid hormone every day, with water, at least 30 minutes before breakfast, separated by at least 4 hours from: - acid reflux medications - calcium - iron - multivitamins  Please come back for a follow-up appointment in 6 months.

## 2016-09-05 NOTE — Progress Notes (Addendum)
Patient ID: [name] Curry, female   DOB: [birth year], 36 y.o.   MRN: [id number]   HPI  [name] Curry is a 36 y.o.-year-old female, initially referred by Dr. 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 Last visit 4 mo ago.  Reviewed hx: In 05/2013, she was seen in the ED for a peritonsillar abscess recently and was also found to have a goiter and thyrotoxicosis. Pt was started on MMI 10 mg bid and referred to me. She developed Hives on this.    She was also on Atenolol 25 mg daily >> stopped b/c mm cramps.  We checked an Uptake and scan that showed MNG and most likely Graves ds.  She stopped MMI 2 weeks prior to RAI tx, on 08/22/2013.  She became hypothyroid afterwards >> started Levothyroxine 75 mcg daily, but her dose was gradually decreased and then stopped last summer as she became thyrotoxic again. Unfortunately, she was lost for f/u afterwards as she lost her insurance.  She returned for a follow-up in 04/2016 >> was feeling oorly and TFTs were thyrotoxic >> I referred her to surgery and she had total thyroidectomy on 06/30/2016 with Dr. Gerrit Friends. She had some neck pain after the Sx, now all resolved.  We started levothyroxine soon after surgery. She is on levothyroxine 125 g daily now. She feels great!  She is taking LT4 - Fasting - With water - Eats breakfast more than 30 minutes later - Not taking calcium, iron, multivitamins, PPIs  I reviewed pt's thyroid tests: Lab Results  Component Value Date   TSH 0.01 (L) 05/01/2016   TSH 0.03 (L) 10/27/2014   TSH 0.01 (L) 09/11/2014   TSH <0.005 (L) 03/08/2014   TSH 0.02 (L) 02/03/2014   TSH 0.02 (L) 11/28/2013   TSH 0.00 (L) 10/23/2013   TSH 0.00 Repeated and verified X2. (L) 09/09/2013   TSH 0.01 (L) 08/01/2013   TSH <0.008 (L) 06/03/2013   FREET4 1.34 05/01/2016   FREET4 1.73 (H) 10/27/2014   FREET4 1.96 (H) 09/11/2014   FREET4 2.22 (H) 03/08/2014   FREET4 1.31 02/03/2014   FREET4 0.41 (L) 11/28/2013   FREET4 1.99 (H) 10/23/2013   FREET4 3.65 (H) 09/09/2013   FREET4 2.43 (H) 08/01/2013   FREET4 4.07 (H) 06/03/2013    Lab Results  Component Value Date   TSI >700 (H) 05/01/2016   Pt is not feeling pain in neck, but has a little hoarseness, no dysphagia/no odynophagia, no SOB with lying down.   I reviewed pt's medications, allergies, PMH, social hx, family hx, and changes were documented in the history of present illness. Otherwise, unchanged from my initial visit note.  Constitutional: no weight gain/loss, no fatigue, no subjective hyperthermia/hypothermia Eyes: no blurry vision, no xerophthalmia ENT: no sore throat, no nodules palpated in throat, no dysphagia/odynophagia, no hoarseness Cardiovascular: no CP/SOB/palpitations/leg swelling Respiratory: no cough/SOB Gastrointestinal: no N/V/D/C Musculoskeletal: no muscle/joint aches Skin: no rashes Neurological: no tremors/numbness/tingling/dizziness  PE: BP 132/72 (BP Location: Left Arm, Patient Position: Sitting, Cuff Size: Normal)   Pulse 70   Temp 98 F (36.7 C) (Oral)   Wt 197 lb 9.6 oz (89.6 kg)   SpO2 97%   BMI 35.00 kg/m  Body mass index is 35 kg/m. Wt Readings from Last 3 Encounters:  09/05/16 197 lb 9.6 oz (89.6 kg)  07/21/16 202 lb 3.2 oz (91.7 kg)  06/27/16 197 lb (89.4 kg)   Constitutional: overweight, in NAD Eyes: PERRLA,  EOMI, + mild exophthalmos, no lid lag, no stare ENT: moist mucous membranes,Thyroid surgery scar almost healed, no keloid, no cervical lymphadenopathy Cardiovascular: RRR, No MRG Respiratory: CTA B Gastrointestinal: abdomen soft, NT, ND, BS+ Musculoskeletal: no deformities, strength intact in all 4 Skin: moist, warm, no rashes Neurological: + mild tremor with outstretched hands (rushed over here), DTR normal in all 4  ASSESSMENT: 1. Postablative hypothyroidism - after thyroidectomy for Graves d.s  - CT  neck (06/05/2013):  Decrease in size of right tonsil abscess, previously 11 x 17 mm, now 6 x 6 mm with decreased inflammatory changes.  Thyromegaly is noted, with 19 mm AP isthmus and substernal extent. 8 mm left thyroid nodule, with additional smaller nor nodules noted.  Prominent jugulodigastric lymph nodes, without pathologic features.  Apparent intimal thickening of the left internal carotid artery.   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  - 07/31/2013: Thyroid Uptake and scan:  4 hr radio iodine uptake is calculated at 46%, above the normal range consistent with hyperthyroidism. Images in 3 projections demonstrate diffuse enlargement of thyroid lobes bilaterally. Multiple small areas of slightly increased in decreased tracer localization identified suggesting diffuse small nodules. No dominant cold or hot nodule identified.  - 08/22/2013: RAI Tx 17.6 mCi.  She was off MMI x 2 weeks prior to the Uptake and scan  - 06/30/2016: Total thyroidectomy - Dr. Gerrit Friends  PLAN:  1.  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 She developed post-ablative hypothyroidism and was briefly on levothyroxine, however, she was then lost for follow-up and she had the recurrence of her thyrotoxicosis. After our last visit, I referred her for total thyroidectomy, which she had 2 months ago with Dr. Gerrit Friends. She initially had neck pain right after the surgery, however, she is now feeling great. We started levothyroxine and she is now on 125 g daily since 06/2016. She is feeling great on this dose! - We discussed about correct intake of levothyroxine: Fasting, with water, separated by more than 30 minutes from breakfast, and more than 4 hours from  calcium, iron, multivitamins, PPIs. She is taking it correctly.  - will check the TSH, fT4 tomorrow (she is in a hurry today) - RTC in 6 months, but in likely sooner for repeat labs  Component     Latest Ref Rng & Units 09/06/2016  TSH     0.35 - 4.50 uIU/mL 0.02 (L)  T4,Free(Direct)     0.60 - 1.60 ng/dL 1.61   TSH is suppressed on the current dose of levothyroxine. However, since you are still close to surgery time, I would like to see another set of TFTs before I change the dose. I would advise her to come back for another check in 1.5 months.  Carlus Pavlov, MD PhD [facility]

## 2016-09-06 ENCOUNTER — Other Ambulatory Visit (INDEPENDENT_AMBULATORY_CARE_PROVIDER_SITE_OTHER): Payer: BLUE CROSS/BLUE SHIELD

## 2016-09-06 DIAGNOSIS — E890 Postprocedural hypothyroidism: Secondary | ICD-10-CM | POA: Diagnosis not present

## 2016-09-06 LAB — TSH: TSH: 0.02 u[IU]/mL — AB (ref 0.35–4.50)

## 2016-09-06 LAB — T4, FREE: FREE T4: 1.33 ng/dL (ref 0.60–1.60)

## 2016-09-06 NOTE — Addendum Note (Signed)
Addended by: Carlus Pavlov on: 09/06/2016 05:50 PM   Modules accepted: Orders

## 2016-10-05 ENCOUNTER — Other Ambulatory Visit: Payer: Self-pay | Admitting: Internal Medicine

## 2016-10-19 ENCOUNTER — Encounter: Payer: Self-pay | Admitting: Internal Medicine

## 2016-10-21 NOTE — Addendum Note (Signed)
Addendum  created 10/21/16 0929 by Sante Biedermann, MD   Sign clinical note    

## 2016-11-17 ENCOUNTER — Other Ambulatory Visit: Payer: Self-pay

## 2016-11-28 ENCOUNTER — Other Ambulatory Visit (INDEPENDENT_AMBULATORY_CARE_PROVIDER_SITE_OTHER): Payer: Self-pay

## 2016-11-28 DIAGNOSIS — E890 Postprocedural hypothyroidism: Secondary | ICD-10-CM

## 2016-11-28 LAB — T4, FREE: Free T4: 1.22 ng/dL (ref 0.60–1.60)

## 2016-11-28 LAB — TSH: TSH: 0.01 u[IU]/mL — ABNORMAL LOW (ref 0.35–4.50)

## 2016-11-29 ENCOUNTER — Encounter: Payer: Self-pay | Admitting: Internal Medicine

## 2016-11-29 ENCOUNTER — Other Ambulatory Visit: Payer: Self-pay

## 2016-11-29 MED ORDER — LEVOTHYROXINE SODIUM 100 MCG PO TABS: 100.0000 ug | ORAL_TABLET | Freq: Every day | ORAL | 1 refills | Status: DC

## 2016-11-29 NOTE — Telephone Encounter (Signed)
**  Remind patient they can make refill requests via MyChart**  Medication refill request (Name & Dosage):   levothyroxine (SYNTHROID, LEVOTHROID) 100 MCG tablet     Preferred pharmacy (Name & Address):  [facility] [address] - [place] ([address]), [place] - [address] [phone] (Phone) [phone] (Fax)      Other comments (if applicable):  Patient needed the rx to go to [facility] not the [facility] [address]; please adjust.

## 2017-01-04 ENCOUNTER — Telehealth: Payer: Self-pay | Admitting: Internal Medicine

## 2017-01-04 ENCOUNTER — Telehealth: Payer: Self-pay

## 2017-01-04 NOTE — Telephone Encounter (Signed)
Patient called to check the status of the note below. Patient states the medication has been recalled yesterday and she needs a call as soon as possible.

## 2017-01-04 NOTE — Telephone Encounter (Signed)
Patient called in reference to levothyroxine (SYNTHROID, LEVOTHROID) 100 MCG tablet being recalled. Please call patient and advise. OK to leave message.

## 2017-01-04 NOTE — Telephone Encounter (Signed)
That is odd! Can she get this from another pharmacy? I am not sure where she got that info.Marland Kitchen..Marland Kitchen

## 2017-01-04 NOTE — Telephone Encounter (Signed)
Called patient to advise of the recall information. Patient answered and phone hung up. Called patient back. Calling to let the patient know that the recall is for the combination drugs not the type of medication she is currently on. Patient understood no questions.  

## 2017-01-04 NOTE — Telephone Encounter (Signed)
Routing to you °

## 2017-01-04 NOTE — Telephone Encounter (Signed)
Called patient to advise of the recall information. Patient answered and phone hung up. Called patient back. Calling to let the patient know that the recall is for the combination drugs not the type of medication she is currently on. Patient understood no questions.

## 2017-01-04 NOTE — Telephone Encounter (Signed)
Please see below.  Thank you.

## 2017-02-26 ENCOUNTER — Telehealth: Payer: Self-pay | Admitting: Internal Medicine

## 2017-02-26 NOTE — Telephone Encounter (Signed)
Please advise. Thank you

## 2017-02-26 NOTE — Telephone Encounter (Signed)
Pt wants to come in for labs, she is 5 days late on her menstrual cycle and is wondering if she is pregnant can she do a pregnancy test?  Patient is set up for tomorrow for labs

## 2017-02-27 ENCOUNTER — Other Ambulatory Visit: Payer: Self-pay

## 2017-02-27 DIAGNOSIS — E890 Postprocedural hypothyroidism: Secondary | ICD-10-CM

## 2017-02-27 NOTE — Telephone Encounter (Signed)
Yes, can you please add a beta HCG, serum? Ty!

## 2017-02-27 NOTE — Telephone Encounter (Signed)
Added to labs

## 2017-03-07 ENCOUNTER — Ambulatory Visit: Payer: Self-pay | Admitting: Internal Medicine

## 2017-03-07 DIAGNOSIS — Z0289 Encounter for other administrative examinations: Secondary | ICD-10-CM

## 2017-03-30 ENCOUNTER — Other Ambulatory Visit: Payer: Self-pay | Admitting: Internal Medicine

## 2017-07-29 ENCOUNTER — Other Ambulatory Visit: Payer: Self-pay | Admitting: Internal Medicine

## 2017-07-31 ENCOUNTER — Telehealth: Payer: Self-pay | Admitting: Internal Medicine

## 2017-07-31 ENCOUNTER — Other Ambulatory Visit (INDEPENDENT_AMBULATORY_CARE_PROVIDER_SITE_OTHER): Payer: Self-pay

## 2017-07-31 DIAGNOSIS — E890 Postprocedural hypothyroidism: Secondary | ICD-10-CM

## 2017-07-31 LAB — TSH: TSH: 1.12 u[IU]/mL (ref 0.35–4.50)

## 2017-07-31 LAB — T4, FREE: FREE T4: 0.84 ng/dL (ref 0.60–1.60)

## 2017-07-31 NOTE — Telephone Encounter (Signed)
Pharmacy request a prescription for levothyroxine (SYNTHROID, LEVOTHROID) 100 MCG tablet [[id number]][[id number]]  [facility] - [place] ([place]  - [address]

## 2017-08-01 MED ORDER — LEVOTHYROXINE SODIUM 100 MCG PO TABS: 100.0000 ug | ORAL_TABLET | Freq: Every day | ORAL | 0 refills | Status: DC

## 2017-08-02 ENCOUNTER — Telehealth: Payer: Self-pay | Admitting: Internal Medicine

## 2017-08-02 NOTE — Telephone Encounter (Signed)
Patient has been waiting for several days to pick her medication-Levothyroxine-Pharmacy said they have sent it to us twice with no response-they gave her a 3 supply but she is now completely out. [facility] [address]

## 2017-08-03 MED ORDER — LEVOTHYROXINE SODIUM 100 MCG PO TABS: 100.0000 ug | ORAL_TABLET | Freq: Every day | ORAL | 0 refills | Status: DC

## 2017-08-03 NOTE — Telephone Encounter (Signed)
Called patient. No answer. Call to advise Rx for Levothyroxine sent on 08/01/2017 @ 3:29 PM [facility] [address]. Redirected Rx to [facility] [address] as requested. Patient will need to schedule an appt for further refills.

## 2017-08-29 ENCOUNTER — Telehealth: Payer: Self-pay | Admitting: Internal Medicine

## 2017-08-29 MED ORDER — LEVOTHYROXINE SODIUM 100 MCG PO TABS: 100.0000 ug | ORAL_TABLET | Freq: Every day | ORAL | 0 refills | Status: DC

## 2017-08-29 NOTE — Telephone Encounter (Signed)
levothyroxine (SYNTHROID, LEVOTHROID) 100 MCG tablet  Patient needs new prescription sent in for a 90 day supply      [facility] - [place], [place] - [address]

## 2017-09-30 ENCOUNTER — Other Ambulatory Visit: Payer: Self-pay | Admitting: Internal Medicine

## 2017-10-01 NOTE — Telephone Encounter (Signed)
Called pt to address med sent for 30 day, need appt. Telephone busy.

## 2017-10-03 ENCOUNTER — Telehealth: Payer: Self-pay | Admitting: Internal Medicine

## 2017-10-03 NOTE — Telephone Encounter (Signed)
Okay to refill for 3 months but she needs an appointment before then.

## 2017-10-03 NOTE — Telephone Encounter (Signed)
Last OV with you: 09/05/16 No existing appts Ok to Rf?

## 2017-10-04 ENCOUNTER — Telehealth: Payer: Self-pay | Admitting: Internal Medicine

## 2017-10-04 NOTE — Telephone Encounter (Signed)
Rx was sent on 10/03/17. See meds. I called pharmacy to verify. Pharmacy states rx is ready to be p/u. I called pt- number is busy. Patient needs to schedule OV with Dr. Elvera Lennox.

## 2017-10-04 NOTE — Telephone Encounter (Signed)
levothyroxine (SYNTHROID, LEVOTHROID) 100 MCG tablet      Patient had pharmacy send over a refill request but they stated they have not received a prescription back from [place]    [facility] - [place], [place] - [address]

## 2017-10-04 NOTE — Telephone Encounter (Signed)
Rx was sent on 10/03/17. See meds.

## 2017-12-10 ENCOUNTER — Inpatient Hospital Stay (HOSPITAL_COMMUNITY)
Admission: AD | Admit: 2017-12-10 | Discharge: 2017-12-10 | Discharge status: Against Medical Advice | Payer: Medicaid Other | Source: Ambulatory Visit | Attending: Obstetrics and Gynecology | Admitting: Obstetrics and Gynecology

## 2017-12-10 ENCOUNTER — Encounter (HOSPITAL_COMMUNITY): Payer: Self-pay | Admitting: *Deleted

## 2017-12-10 DIAGNOSIS — N939 Abnormal uterine and vaginal bleeding, unspecified: Secondary | ICD-10-CM | POA: Insufficient documentation

## 2017-12-10 DIAGNOSIS — Z5321 Procedure and treatment not carried out due to patient leaving prior to being seen by health care provider: Secondary | ICD-10-CM | POA: Insufficient documentation

## 2017-12-10 LAB — URINALYSIS, ROUTINE W REFLEX MICROSCOPIC
BACTERIA UA: NONE SEEN
Bilirubin Urine: NEGATIVE
Glucose, UA: NEGATIVE mg/dL
Ketones, ur: NEGATIVE mg/dL
Leukocytes, UA: NEGATIVE
NITRITE: NEGATIVE
PROTEIN: NEGATIVE mg/dL
Specific Gravity, Urine: 1.014 (ref 1.005–1.030)
pH: 6 (ref 5.0–8.0)

## 2017-12-10 LAB — POCT PREGNANCY, URINE: PREG TEST UR: NEGATIVE

## 2017-12-10 NOTE — MAU Note (Signed)
Pt came to front desk asking what was taking so long. Pt informed that her pregnancy test was negative and the OB pts have to be seen first and that we were doing our best to get her seen. Pt states shes not waiting any longer and left AMA pt left without signing papers.

## 2017-12-10 NOTE — MAU Note (Signed)
Pt presents to MAU with complaints of heavy vaginal bleeding and states it is not time for her cycle. States she thinks she may be having a miscarriage.

## 2017-12-10 NOTE — MAU Note (Signed)
PT  SAYS SHE WAS HERE -  TRIAGED AND HAD TO LEAVE.  SAYS SHE STARTED  VAG BLEEDING YESTERDAY- IN CLOTHES.    CRAMPS STARTED YESTERDAY- - TOOK PEPTO.   PT SAYS SHE HAS IN [place]- NOTHING ON CLOTHES.

## 2017-12-11 ENCOUNTER — Encounter (HOSPITAL_COMMUNITY): Payer: Self-pay | Admitting: Emergency Medicine

## 2017-12-11 ENCOUNTER — Emergency Department (HOSPITAL_COMMUNITY): Payer: Self-pay

## 2017-12-11 ENCOUNTER — Emergency Department (HOSPITAL_COMMUNITY)
Admission: EM | Admit: 2017-12-11 | Discharge: 2017-12-11 | Disposition: A | Discharge status: Home / Self Care | Payer: Self-pay | Attending: Emergency Medicine | Admitting: Emergency Medicine

## 2017-12-11 DIAGNOSIS — F17210 Nicotine dependence, cigarettes, uncomplicated: Secondary | ICD-10-CM | POA: Insufficient documentation

## 2017-12-11 DIAGNOSIS — N938 Other specified abnormal uterine and vaginal bleeding: Secondary | ICD-10-CM | POA: Insufficient documentation

## 2017-12-11 DIAGNOSIS — R197 Diarrhea, unspecified: Secondary | ICD-10-CM | POA: Insufficient documentation

## 2017-12-11 DIAGNOSIS — Z79899 Other long term (current) drug therapy: Secondary | ICD-10-CM | POA: Insufficient documentation

## 2017-12-11 DIAGNOSIS — R112 Nausea with vomiting, unspecified: Secondary | ICD-10-CM | POA: Insufficient documentation

## 2017-12-11 DIAGNOSIS — R1010 Upper abdominal pain, unspecified: Secondary | ICD-10-CM | POA: Insufficient documentation

## 2017-12-11 DIAGNOSIS — J45909 Unspecified asthma, uncomplicated: Secondary | ICD-10-CM | POA: Insufficient documentation

## 2017-12-11 DIAGNOSIS — E039 Hypothyroidism, unspecified: Secondary | ICD-10-CM | POA: Insufficient documentation

## 2017-12-11 DIAGNOSIS — M545 Low back pain: Secondary | ICD-10-CM | POA: Insufficient documentation

## 2017-12-11 DIAGNOSIS — R1030 Lower abdominal pain, unspecified: Secondary | ICD-10-CM | POA: Insufficient documentation

## 2017-12-11 LAB — COMPREHENSIVE METABOLIC PANEL
ALT: 23 U/L (ref 0–44)
ANION GAP: 9 (ref 5–15)
AST: 19 U/L (ref 15–41)
Albumin: 4.1 g/dL (ref 3.5–5.0)
Alkaline Phosphatase: 102 U/L (ref 38–126)
BUN: 9 mg/dL (ref 6–20)
CALCIUM: 9 mg/dL (ref 8.9–10.3)
CO2: 26 mmol/L (ref 22–32)
Chloride: 107 mmol/L (ref 98–111)
Creatinine, Ser: 0.8 mg/dL (ref 0.44–1.00)
GFR calc non Af Amer: >60 mL/min (ref >60)
Glucose, Bld: 107 mg/dL — ABNORMAL HIGH (ref 70–99)
POTASSIUM: 3.6 mmol/L (ref 3.5–5.1)
SODIUM: 142 mmol/L (ref 135–145)
Total Bilirubin: 0.8 mg/dL (ref 0.3–1.2)
Total Protein: 7.6 g/dL (ref 6.5–8.1)

## 2017-12-11 LAB — URINALYSIS, ROUTINE W REFLEX MICROSCOPIC
Bilirubin Urine: NEGATIVE
GLUCOSE, UA: NEGATIVE mg/dL
Ketones, ur: 5 mg/dL — AB
Leukocytes, UA: NEGATIVE
NITRITE: NEGATIVE
Protein, ur: NEGATIVE mg/dL
Specific Gravity, Urine: 1.025 (ref 1.005–1.030)
pH: 5 (ref 5.0–8.0)

## 2017-12-11 LAB — CBC
HCT: 37.8 % (ref 36.0–46.0)
HEMOGLOBIN: 12.6 g/dL (ref 12.0–15.0)
MCH: 25.6 pg — ABNORMAL LOW (ref 26.0–34.0)
MCHC: 33.3 g/dL (ref 30.0–36.0)
MCV: 76.8 fL — ABNORMAL LOW (ref 78.0–100.0)
Platelets: 289 10*3/uL (ref 150–400)
RBC: 4.92 MIL/uL (ref 3.87–5.11)
RDW: 14.4 % (ref 11.5–15.5)
WBC: 10.1 10*3/uL (ref 4.0–10.5)

## 2017-12-11 LAB — I-STAT BETA HCG BLOOD, ED (MC, WL, AP ONLY)

## 2017-12-11 LAB — WET PREP, GENITAL
Sperm: NONE SEEN
TRICH WET PREP: NONE SEEN
YEAST WET PREP: NONE SEEN

## 2017-12-11 LAB — DIFFERENTIAL
BASOS ABS: 0 10*3/uL (ref 0.0–0.1)
Basophils Relative: 0 %
EOS ABS: 0.1 10*3/uL (ref 0.0–0.7)
Eosinophils Relative: 1 %
LYMPHS ABS: 3 10*3/uL (ref 0.7–4.0)
Lymphocytes Relative: 30 %
MONOS PCT: 7 %
Monocytes Absolute: 0.7 10*3/uL (ref 0.1–1.0)
NEUTROS ABS: 6 10*3/uL (ref 1.7–7.7)
NEUTROS PCT: 62 %

## 2017-12-11 LAB — LIPASE, BLOOD: LIPASE: 24 U/L (ref 11–51)

## 2017-12-11 MED ORDER — ONDANSETRON HCL 4 MG/2ML IJ SOLN
4.0000 mg | Freq: Once | INTRAMUSCULAR | Status: AC
  Administered 2017-12-11: 4 mg via INTRAVENOUS
  Filled 2017-12-11: qty 2

## 2017-12-11 MED ORDER — NAPROXEN 375 MG PO TABS: 375.0000 mg | ORAL_TABLET | Freq: Two times a day (BID) | ORAL | 0 refills | Status: DC

## 2017-12-11 MED ORDER — MORPHINE SULFATE (PF) 4 MG/ML IV SOLN
8.0000 mg | Freq: Once | INTRAVENOUS | Status: AC
  Administered 2017-12-11: 8 mg via INTRAVENOUS
  Filled 2017-12-11: qty 2

## 2017-12-11 MED ORDER — SODIUM CHLORIDE 0.9 % IV BOLUS
1000.0000 mL | Freq: Once | INTRAVENOUS | Status: AC
  Administered 2017-12-11: 1000 mL via INTRAVENOUS

## 2017-12-11 NOTE — ED Provider Notes (Signed)
[facility]-EMERGENCY DEPT Provider Note   CSN: [id number] Arrival date & time: 12/11/17  0859     History   Chief Complaint Chief Complaint  Patient presents with  . Abdominal Pain  . Nausea    HPI [name] Curry is a 37 y.o. female.  HPI  37 year old female presents with severe abdominal pain.  Started 3 days ago.  Shortly after the abdominal pain started in her lower abdomen she developed vaginal bleeding that has been heavy.  Her last menstrual cycle was a couple weeks ago and is normally is very regular.  This is abnormal bleeding for her.  She is also developed diarrhea starting that same day as well as vomiting.  She took Pepto-Bismol and Benadryl.  Since the Pepto-Bismol her stools have been black.  The abdominal pain seems mostly lower but also she is having some upper abdominal pain, especially when she tries to cough or eat.  She has not taken anything else for pain.  No fevers.  There is some low back pain.  No dysuria.  Past Medical History:  Diagnosis Date  . Anemia   . Anxiety   . Asthma   . Bipolar disorder (HCC)   . Depression   . Graves disease   . Graves disease   . Hypothyroidism 06/30/2016  . Sickle cell trait (HCC)   . Thyroid disease    was hyperthyroid, had radiaton in April 2015, now hyperthyroid    Patient Active Problem List   Diagnosis Date Noted  . H/O Graves' disease 09/05/2016  . Breast lump 04/21/2014  . Tobacco abuse 04/21/2014  . Routine general medical examination at a health care facility 04/21/2014  . 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 Suicidal behavior 08/05/2011    Past Surgical History:  Procedure Laterality Date  . COLPOSCOPY    . EXCISIONAL HEMORRHOIDECTOMY  1996  . THYROIDECTOMY N/A 06/30/2016   Procedure: TOTAL THYROIDECTOMY;  Surgeon: Darnell Level, MD;  Location[facility];  Service: General;  Laterality: N/A;  . TOTAL THYROIDECTOMY  06/30/2016     OB  History   None      Home Medications    Prior to Admission medications   Medication Sig Start Date End Date Taking? Authorizing Provider  bismuth subsalicylate (PEPTO BISMOL) 262 MG/15ML suspension Take 30 mLs by mouth every 6 (six) hours as needed for indigestion.   Yes [provider]  diphenhydrAMINE (BENADRYL) 25 MG tablet Take 25 mg by mouth every 6 (six) hours as needed for sleep.   Yes [provider]  levothyroxine (SYNTHROID, LEVOTHROID) 100 MCG tablet TAKE ONE TABLET BY MOUTH DAILY 10/03/17  Yes Carlus Pavlov, MD  naproxen (NAPROSYN) 375 MG tablet Take 1 tablet (375 mg total) by mouth 2 (two) times daily with a meal. 12/11/17   Pricilla Loveless, MD    Family History Family History  Problem Relation Age of Onset  . Arthritis Mother   . Hyperlipidemia Father   . Diabetes Other     Social History Social History   Tobacco Use  . Smoking status: Current Every Day Smoker    Packs/day: 0.50    Years: 17.00    Pack years: 8.50    Types: Cigars, Cigarettes  . Smokeless tobacco: Never Used  Substance Use Topics  . Alcohol use: Yes    Comment: 06/30/2016 "a beer q couple weeks"  . Drug use: Yes    Types: Marijuana    Comment: 06/30/2016 "  once/week"     Allergies   Atenolol; Methimazole; Penicillins; and Sulfa antibiotics   Review of Systems Review of Systems  Constitutional: Negative for fever.  Gastrointestinal: Positive for abdominal pain, diarrhea, nausea and vomiting. Negative for blood in stool.  Genitourinary: Positive for vaginal bleeding. Negative for dysuria.  Musculoskeletal: Positive for back pain.  All other systems reviewed and are negative.    Physical Exam Updated Vital Signs BP 115/80   Pulse 71   Temp 98.5 F (36.9 C) (Oral)   Resp 14   LMP 11/18/2017   SpO2 100%   Physical Exam  Constitutional: She is oriented to person, place, and time. She appears well-developed and well-nourished.  Non-toxic appearance. She does  not appear ill.  HENT:  Head: Normocephalic and atraumatic.  Right Ear: External ear normal.  Left Ear: External ear normal.  Nose: Nose normal.  Eyes: Right eye exhibits no discharge. Left eye exhibits no discharge.  Cardiovascular: Normal rate, regular rhythm and normal heart sounds.  Pulmonary/Chest: Effort normal and breath sounds normal.  Abdominal: Soft. There is tenderness in the right lower quadrant, suprapubic area and left lower quadrant.  Genitourinary: Uterus is tender. Cervix exhibits no motion tenderness. There is bleeding (minimal from cervix) in the vagina.  Neurological: She is alert and oriented to person, place, and time.  Skin: Skin is warm and dry.  Nursing note and vitals reviewed.    ED Treatments / Results  Labs (all labs ordered are listed, but only abnormal results are displayed) Labs Reviewed  WET PREP, GENITAL - Abnormal; Notable for the following components:      Result Value   Clue Cells Wet Prep HPF POC PRESENT (*)    WBC, Wet Prep HPF POC MODERATE (*)    All other components within normal limits  COMPREHENSIVE METABOLIC PANEL - Abnormal; Notable for the following components:   Glucose, Bld 107 (*)    All other components within normal limits  CBC - Abnormal; Notable for the following components:   MCV 76.8 (*)    MCH 25.6 (*)    All other components within normal limits  URINALYSIS, ROUTINE W REFLEX MICROSCOPIC - Abnormal; Notable for the following components:   Hgb urine dipstick MODERATE (*)    Ketones, ur 5 (*)    Bacteria, UA RARE (*)    All other components within normal limits  LIPASE, BLOOD  DIFFERENTIAL  CBC WITH DIFFERENTIAL/PLATELET  I-STAT BETA HCG BLOOD, ED ([facility], [facility], [facility] ONLY)  GC/CHLAMYDIA PROBE AMP ([facility]) NOT AT [facility]    EKG None  Radiology US Transvaginal Non-ob  Result Date: 12/11/2017 CLINICAL DATA:  Vaginal bleeding and pain EXAM: TRANSABDOMINAL AND TRANSVAGINAL ULTRASOUND OF PELVIS DOPPLER ULTRASOUND OF OVARIES  TECHNIQUE: Study was performed transabdominally to optimize pelvic field of view evaluation and transvaginally to optimize internal visceral architecture evaluation. Color and duplex Doppler ultrasound was utilized to evaluate blood flow to the ovaries. COMPARISON:  June 30, 2010 FINDINGS: Uterus Measurements: 6.7 x 3.4 x 4.4 cm. No fibroids or other mass visualized. Endometrium Thickness: 3 mm.  No focal abnormality visualized. Right ovary Measurements: 2.9 x 1.8 x 2.2 cm. Normal appearance/no adnexal mass beyond physiologic follicles. Left ovary Measurements: 2.6 x 1.8 x 2.3 cm. Normal appearance/no adnexal mass. Pulsed Doppler evaluation of both ovaries demonstrates normal low-resistance arterial and venous waveforms. Other findings Trace free fluid. IMPRESSION: Ovaries are normal in size and contour bilaterally. Doppler flow appears unremarkable in each ovary. No evidence suggesting ovarian torsion.  Uterus is normal in size and contour. Endometrium is normal in thickness and appearance. Trace free pelvic fluid may be physiologic. Electronically Signed   By: Bretta BangWilliam  Woodruff III M.D.   On: 12/11/2017 12:53   [place] Pelvis Complete  Result Date: 12/11/2017 CLINICAL DATA:  Vaginal bleeding and pain EXAM: TRANSABDOMINAL AND TRANSVAGINAL ULTRASOUND OF PELVIS DOPPLER ULTRASOUND OF OVARIES TECHNIQUE: Study was performed transabdominally to optimize pelvic field of view evaluation and transvaginally to optimize internal visceral architecture evaluation. Color and duplex Doppler ultrasound was utilized to evaluate blood flow to the ovaries. COMPARISON:  June 30, 2010 FINDINGS: Uterus Measurements: 6.7 x 3.4 x 4.4 cm. No fibroids or other mass visualized. Endometrium Thickness: 3 mm.  No focal abnormality visualized. Right ovary Measurements: 2.9 x 1.8 x 2.2 cm. Normal appearance/no adnexal mass beyond physiologic follicles. Left ovary Measurements: 2.6 x 1.8 x 2.3 cm. Normal appearance/no adnexal mass. Pulsed  Doppler evaluation of both ovaries demonstrates normal low-resistance arterial and venous waveforms. Other findings Trace free fluid. IMPRESSION: Ovaries are normal in size and contour bilaterally. Doppler flow appears unremarkable in each ovary. No evidence suggesting ovarian torsion. Uterus is normal in size and contour. Endometrium is normal in thickness and appearance. Trace free pelvic fluid may be physiologic. Electronically Signed   By: Bretta BangWilliam  Woodruff III M.D.   On: 12/11/2017 12:53   [place] Art/ven Flow Abd Pelv Doppler  Result Date: 12/11/2017 CLINICAL DATA:  Vaginal bleeding and pain EXAM: TRANSABDOMINAL AND TRANSVAGINAL ULTRASOUND OF PELVIS DOPPLER ULTRASOUND OF OVARIES TECHNIQUE: Study was performed transabdominally to optimize pelvic field of view evaluation and transvaginally to optimize internal visceral architecture evaluation. Color and duplex Doppler ultrasound was utilized to evaluate blood flow to the ovaries. COMPARISON:  June 30, 2010 FINDINGS: Uterus Measurements: 6.7 x 3.4 x 4.4 cm. No fibroids or other mass visualized. Endometrium Thickness: 3 mm.  No focal abnormality visualized. Right ovary Measurements: 2.9 x 1.8 x 2.2 cm. Normal appearance/no adnexal mass beyond physiologic follicles. Left ovary Measurements: 2.6 x 1.8 x 2.3 cm. Normal appearance/no adnexal mass. Pulsed Doppler evaluation of both ovaries demonstrates normal low-resistance arterial and venous waveforms. Other findings Trace free fluid. IMPRESSION: Ovaries are normal in size and contour bilaterally. Doppler flow appears unremarkable in each ovary. No evidence suggesting ovarian torsion. Uterus is normal in size and contour. Endometrium is normal in thickness and appearance. Trace free pelvic fluid may be physiologic. Electronically Signed   By: Bretta BangWilliam  Woodruff III M.D.   On: 12/11/2017 12:53    Procedures Procedures (including critical care time)  Medications Ordered in ED Medications  morphine 4 MG/ML  injection 8 mg (8 mg Intravenous Given 12/11/17 1024)  sodium chloride 0.9 % bolus 1,000 mL (0 mLs Intravenous Stopped 12/11/17 1254)  ondansetron (ZOFRAN) injection 4 mg (4 mg Intravenous Given 12/11/17 1024)     Initial Impression / Assessment and Plan / ED Course  I have reviewed the triage vital signs and the nursing notes.  Pertinent labs & imaging results that were available during my care of the patient were reviewed by me and considered in my medical decision making (see chart for details).     Patient's lower abdominal pain is likely from dysfunctional uterine bleeding.  Ultrasound unremarkable.  I do not think a CT would be beneficial given stable vital signs, no fever, normal WBC.  There is no localizing tenderness besides diffuse lower tenderness.  Exam is not consistent with cervicitis or PID.  I really do not think there is  an appendicitis or other acute intra-abdominal emergency such as perforation or abscess.  This all started with the bleeding and lower abdominal pain I think that is her main issue.  She has some clue cells in her wet prep but no vaginal discharge or odor and so I do not think this is symptomatic BV.  I will prescribe her NSAIDs and advised not to use other NSAIDs along with.  Follow-up with her OB/GYN.  We discussed return precautions.  Final Clinical Impressions(s) / ED Diagnoses   Final diagnoses:  Dysfunctional uterine bleeding    ED Discharge Orders        Ordered    naproxen (NAPROSYN) 375 MG tablet  2 times daily with meals,   Status:  Discontinued     12/11/17 1333    naproxen (NAPROSYN) 375 MG tablet  2 times daily with meals     12/11/17 1334       Pricilla Loveless, MD 12/11/17 1530

## 2017-12-11 NOTE — ED Triage Notes (Signed)
Patient here from home with complaints of abdominal pain "all over" and nausea. States that her and her husband are trying to have a baby. [facility] but "wait was too long".

## 2017-12-11 NOTE — Discharge Instructions (Signed)
If your abdominal pain worsens or localizes to one area such as your right lower abdomen or you develop fevers, vomiting, or other new/concerning symptoms then return to the ER for evaluation.  Do not take ibuprofen, Aleve, naproxen, Motrin, Advil, or other nonsteroidal anti-inflammatory drugs as these are similar to the naproxen given to you today.  Follow-up with your OB/GYN this week.

## 2017-12-12 LAB — GC/CHLAMYDIA PROBE AMP (~~LOC~~) NOT AT ARMC
Chlamydia: NEGATIVE
Neisseria Gonorrhea: NEGATIVE

## 2017-12-14 ENCOUNTER — Telehealth: Payer: Self-pay | Admitting: Internal Medicine

## 2017-12-14 NOTE — Telephone Encounter (Signed)
This is per PCP. OTW, we will check her TFTs when she returns to see me.

## 2017-12-14 NOTE — Telephone Encounter (Signed)
Pt called back when you were with a patient. She asked that you give her a call back thanks.

## 2017-12-14 NOTE — Telephone Encounter (Signed)
LMTCB

## 2017-12-14 NOTE — Telephone Encounter (Signed)
Pt will speak with her PCP but made an appt for 02/2018 to see Dr Elvera LennoxGherghe

## 2017-12-14 NOTE — Telephone Encounter (Signed)
Pt is aware no labs will be ordered by Dr Elvera LennoxGherghe ahead of time

## 2017-12-14 NOTE — Telephone Encounter (Signed)
Patient is requesting to have labs done to check for hyperhidrosis  Please advise

## 2017-12-14 NOTE — Telephone Encounter (Signed)
Please advise on below she has not had a visit since 08/2016

## 2017-12-21 ENCOUNTER — Ambulatory Visit: Payer: Medicaid Other | Admitting: Family Medicine

## 2017-12-31 ENCOUNTER — Other Ambulatory Visit: Payer: Self-pay | Admitting: Internal Medicine

## 2017-12-31 ENCOUNTER — Telehealth: Payer: Self-pay | Admitting: Emergency Medicine

## 2017-12-31 NOTE — Telephone Encounter (Signed)
Refilled this morning.

## 2017-12-31 NOTE — Telephone Encounter (Signed)
Pt called and stated she needs a refill on her levothyroxine (SYNTHROID, LEVOTHROID) 100 MCG tablet   [facility] [address]. Thanks.

## 2018-02-27 ENCOUNTER — Ambulatory Visit (INDEPENDENT_AMBULATORY_CARE_PROVIDER_SITE_OTHER): Payer: Self-pay | Admitting: Internal Medicine

## 2018-02-27 ENCOUNTER — Encounter: Payer: Self-pay | Admitting: Internal Medicine

## 2018-02-27 VITALS — BP 122/80 | HR 82 | Ht 63.0 in | Wt 208.0 lb

## 2018-02-27 DIAGNOSIS — Z8639 Personal history of other endocrine, nutritional and metabolic disease: Secondary | ICD-10-CM

## 2018-02-27 DIAGNOSIS — E890 Postprocedural hypothyroidism: Secondary | ICD-10-CM

## 2018-02-27 DIAGNOSIS — Z319 Encounter for procreative management, unspecified: Secondary | ICD-10-CM

## 2018-02-27 DIAGNOSIS — Z23 Encounter for immunization: Secondary | ICD-10-CM

## 2018-02-27 LAB — TSH: TSH: 0.32 u[IU]/mL — AB (ref 0.35–4.50)

## 2018-02-27 LAB — T4, FREE: FREE T4: 1.13 ng/dL (ref 0.60–1.60)

## 2018-02-27 NOTE — Patient Instructions (Addendum)
Please continue Levothyroxine 100 mcg daily.  Take the thyroid hormone every day, with water, at least 30 minutes before breakfast, separated by at least 4 hours from: - acid reflux medications - calcium - iron - multivitamins  Please come back for a follow-up appointment in 6 months.    

## 2018-02-27 NOTE — Progress Notes (Signed)
Patient ID: [name] Curry, female   DOB: January 25, 1981, 37 y.o.   MRN: [id number]   HPI  DENIS KOPPEL is a 37 y.o.-year-old female, initially referred by Dr. 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 Last visit 1 year and 6 months ago!  At this visit, upon questioning, she tells me that she and her husband are trying for pregnancy.  She does feel nauseated in the morning.  Reviewed and addended history In 05/2013, she was seen in the ED for a peritonsillar abscess recently and was also found to have a goiter and thyrotoxicosis. Pt was started on MMI 10 mg bid and referred to me. She developed Hives on this.    She was also on Atenolol 25 mg daily >> stopped b/c mm cramps.  We checked an Uptake and scan that showed MNG and most likely Graves ds.  She stopped MMI 2 weeks prior to RAI tx, on 08/22/2013.  She became hypothyroid afterwards >> started Levothyroxine 75 mcg daily, but her dose was gradually decreased and then stopped last summer as she became thyrotoxic again. Unfortunately, she was lost for f/u afterwards as she lost her insurance.  She returned for a follow-up in 04/2016 >> was feeling poorly and TFTs were thyrotoxic >> I referred her to surgery and she had total thyroidectomy on 06/30/2016 with Dr. Gerrit Friends.  She had some neck pain after surgery, now resolved.  We started levothyroxine soon after surgery.  We initially started at 125 mcg daily but had to decrease 200 mcg daily.  On this dose, latest TFTs were normal in 07/2017.  Pt is on levothyroxine 100 Mcg daily, taken: - in am - fasting - at least 30 min from b'fast - no Ca, Fe, MVI, PPIs - not on Biotin  Reviewed patient's TFTs: Lab Results  Component Value Date   TSH 1.12 07/31/2017   TSH 0.01 (L) 11/28/2016   TSH 0.02 (L) 09/06/2016   TSH 0.01 (L) 05/01/2016   TSH 0.03 (L) 10/27/2014   TSH 0.01 (L) 09/11/2014   TSH <0.005 (L) 03/08/2014   TSH 0.02 (L) 02/03/2014   TSH 0.02 (L) 11/28/2013   TSH 0.00 (L) 10/23/2013   FREET4 0.84 07/31/2017   FREET4 1.22 11/28/2016   FREET4 1.33 09/06/2016   FREET4 1.34 05/01/2016   FREET4 1.73 (H) 10/27/2014   FREET4 1.96 (H) 09/11/2014   FREET4 2.22 (H) 03/08/2014   FREET4 1.31 02/03/2014   FREET4 0.41 (L) 11/28/2013   FREET4 1.99 (H) 10/23/2013    Her TSI antibodies were very high: Lab Results  Component Value Date   TSI >700 (H) 05/01/2016   Pt denies: - feeling nodules in neck - hoarseness - dysphagia - choking - SOB with lying down  Constitutional: + Weight gain/no weight loss, no fatigue, + excessive sweating, no subjective hypothermia Eyes: no blurry vision, no xerophthalmia ENT: no sore throat, + see HPI Cardiovascular: no CP/+ SOB/no palpitations/no leg swelling Respiratory: no cough/+ SOB/no wheezing Gastrointestinal: + N/no V/no D/no C/no acid reflux Musculoskeletal: no muscle aches/no joint aches Skin: no rashes, no hair loss Neurological: no tremors/no numbness/no tingling/no dizziness  I reviewed pt's medications, allergies, PMH, social hx, family hx, and changes were documented in the history of present illness. Otherwise, unchanged from my initial visit note.  Past Medical History:  Diagnosis Date  . Anemia   . Anxiety   . Asthma   . Bipolar disorder (HCC)   .  Depression   . Graves disease   . Graves disease   . Hypothyroidism 06/30/2016  . Sickle cell trait (HCC)   . Thyroid disease    was hyperthyroid, had radiaton in April 2015, now hyperthyroid   Past Surgical History:  Procedure Laterality Date  . COLPOSCOPY    . EXCISIONAL HEMORRHOIDECTOMY  1996  . THYROIDECTOMY N/A 06/30/2016   Procedure: TOTAL THYROIDECTOMY;  Surgeon: Darnell Level, MD;  Location: [facility] OR;  Service: General;  Laterality: N/A;  . TOTAL THYROIDECTOMY  06/30/2016   Social History   Socioeconomic History  . Marital status: Married    Spouse name:  Not on file  . Number of children: Not on file  . Years of education: Not on file  . Highest education level: Not on file  Occupational History  . Not on file  Social Needs  . Financial resource strain: Not on file  . Food insecurity:    Worry: Not on file    Inability: Not on file  . Transportation needs:    Medical: Not on file    Non-medical: Not on file  Tobacco Use  . Smoking status: Current Every Day Smoker    Packs/day: 0.50    Years: 17.00    Pack years: 8.50    Types: Cigars, Cigarettes  . Smokeless tobacco: Never Used  Substance and Sexual Activity  . Alcohol use: Yes    Comment: 06/30/2016 "a beer q couple weeks"  . Drug use: Yes    Types: Marijuana    Comment: 06/30/2016 "once/week"  . Sexual activity: Yes    Birth control/protection: None  Lifestyle  . Physical activity:    Days per week: Not on file    Minutes per session: Not on file  . Stress: Not on file  Relationships  . Social connections:    Talks on phone: Not on file    Gets together: Not on file    Attends religious service: Not on file    Active member of club or organization: Not on file    Attends meetings of clubs or organizations: Not on file    Relationship status: Not on file  . Intimate partner violence:    Fear of current or ex partner: Not on file    Emotionally abused: Not on file    Physically abused: Not on file    Forced sexual activity: Not on file  Other Topics Concern  . Not on file  Social History Narrative  . Not on file   Current Outpatient Medications on File Prior to Visit  Medication Sig Dispense Refill  . diphenhydrAMINE (BENADRYL) 25 MG tablet Take 25 mg by mouth every 6 (six) hours as needed for sleep.    Marland Kitchen levothyroxine (SYNTHROID, LEVOTHROID) 100 MCG tablet TAKE ONE TABLET BY MOUTH DAILY 30 tablet 1  . bismuth subsalicylate (PEPTO BISMOL) 262 MG/15ML suspension Take 30 mLs by mouth every 6 (six) hours as needed for indigestion.    . naproxen (NAPROSYN) 375 MG  tablet Take 1 tablet (375 mg total) by mouth 2 (two) times daily with a meal. (Patient not taking: Reported on 02/27/2018) 20 tablet 0   No current facility-administered medications on file prior to visit.    Allergies  Allergen Reactions  . Atenolol Other (See Comments)    Muscle cramps  . Methimazole Hives  . Penicillins Hives and Nausea And Vomiting     Has patient had a PCN reaction causing immediate rash, facial/tongue/throat swelling, SOB or lightheadedness  with hypotension: #  #  #  YES  #  #  #  Has patient had a PCN reaction causing severe rash involving mucus membranes or skin necrosis: No Has patient had a PCN reaction that required hospitalization No Has patient had a PCN reaction occurring within the last 10 years: No If all of the above answers are "NO", then may proceed with Cephalosporin use.   . Sulfa Antibiotics Hives and Nausea And Vomiting   Family History  Problem Relation Age of Onset  . Arthritis Mother   . Hyperlipidemia Father   . Diabetes Other     PE: BP 122/80   Pulse 82   Ht 5\' 3"  (1.6 m)   Wt 208 lb (94.3 kg)   SpO2 97%   BMI 36.85 kg/m  Body mass index is 36.85 kg/m. Wt Readings from Last 3 Encounters:  02/27/18 208 lb (94.3 kg)  12/10/17 203 lb 12 oz (92.4 kg)  12/10/17 204 lb (92.5 kg)   Constitutional: overweight, in NAD Eyes: PERRLA, EOMI, + mild exophthalmos, no lid lag, no stare ENT: moist mucous membranes, + thyroidectomy scar completely healed, no keloid, no cervical lymphadenopathy Cardiovascular: RRR, No MRG Respiratory: CTA B Gastrointestinal: abdomen soft, NT, ND, BS+ Musculoskeletal: no deformities, strength intact in all 4 Skin: moist, warm, no rashes Neurological: No tremor with outstretched hands, DTR normal in all 4  ASSESSMENT: 1. Postablative hypothyroidism - after thyroidectomy for Graves' disease  - CT neck (06/05/2013):  Decrease in size of right tonsil abscess, previously 11 x 17 mm, now 6 x 6 mm with  decreased inflammatory changes.  Thyromegaly is noted, with 19 mm AP isthmus and substernal extent. 8 mm left thyroid nodule, with additional smaller nor nodules noted.  Prominent jugulodigastric lymph nodes, without pathologic features.  Apparent intimal thickening of the left internal carotid artery.   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  - 07/31/2013: Thyroid Uptake and scan:  4 hr radio iodine uptake is calculated at 46%, above the normal range consistent with hyperthyroidism. Images in 3 projections demonstrate diffuse enlargement of thyroid lobes bilaterally. Multiple small areas of slightly increased in decreased tracer localization identified suggesting diffuse small nodules. No dominant cold or hot nodule identified.  - 08/22/2013: RAI Tx 17.6 mCi.  She was off MMI x 2 weeks prior to the Uptake and scan  - 06/30/2016: Total thyroidectomy - Dr. Gerrit Friends  2.  History of Graves' disease  3. Desire for pregnancy  PLAN:  1.  Patient with history of hyperthyroidism, initially considered due to either toxic multinodular goiter versus Graves' disease, however, the extremely high TSI antibodies (>700) pointing towards Graves' disease.  She initially had thyrotoxic symptoms: Weight loss, heat intolerance, tremors, palpitations, anxiety, which have resolved after her RAI treatment.  She developed post ablative hypothyroidism and was briefly on levothyroxine, however, she was then lost for follow-up and she had recurrence of her thyrotoxicosis.  I referred her for total thyroidectomy, which she had in 06/2016 with Dr. Gerrit Friends. We started her on levothyroxine after the surgery, initially at 125 mcg daily, and then 100 mcg daily. - latest thyroid labs reviewed with pt >> normal in 07/2017 - she continues on LT4 100 mcg daily - pt feels good on this dose and will continue this for now at least until the results are  back - We discussed about targets for TSH levels before and during pregnancy.  We  discussed that ideally she would get pregnant with a TSH lower than 2.5 and keep it in that range for the first trimester, after which the targets are more lax, lower than 3.  We will check a serum pregnancy test today, but if negative, I advised her to let me know as soon as she gets pregnant that she will need an increase in levothyroxine dose.  After the increasing dose, will need to have her back for labs in 4 weeks. - we discussed about taking the thyroid hormone every day, with water, >30 minutes before breakfast, separated by >4 hours from acid reflux medications, calcium, iron, multivitamins. Pt. is taking it correctly. - will check thyroid tests today: TSH and fT4 - If labs are abnormal, she will need to return for repeat TFTs in 1.5 months  2.  History of Graves' disease -Patient has mild exophthalmos most likely related to her Graves' disease -At today's visit, we will also check TSI's to see if she still has Graves' antibodies which are considered to be contributing to Graves' ophthalmopathy.  At last check, these were undetectably high. -She does not complain of chemosis, double vision, but does have blurry vision and may need glasses  3.  Desire for pregnancy -She complains of weight gain and nausea in the morning.  She and her husband have been trying for pregnancy.  We will check a pregnancy test today.  She needs refills of levothyroxine.  Component     Latest Ref Rng & Units 02/27/2018  TSH     0.35 - 4.50 uIU/mL 0.32 (L)  T4,Free(Direct)     0.60 - 1.60 ng/dL 1.61  Preg, Serum      NEGATIVE  TSI     <140 % baseline <89   TSI's are undetectable, which is excellent. Her TSH is slightly low, but, I would not change the dose of her levothyroxine now as we are trying to stay at the [facility] lower than 2.5 for a possible pregnancy. I would like to repeat her tests in 2 months and if TSH is lower at  that time, we may need to decrease the levothyroxine dose then.  Carlus Pavlov, MD PhD [facility]

## 2018-02-28 ENCOUNTER — Telehealth: Payer: Self-pay | Admitting: Internal Medicine

## 2018-02-28 ENCOUNTER — Other Ambulatory Visit: Payer: Self-pay | Admitting: Internal Medicine

## 2018-02-28 NOTE — Telephone Encounter (Signed)
Pt called and said she got a pregnancy test and other labs done. She has not received her results. Please advise

## 2018-03-01 ENCOUNTER — Encounter: Payer: Self-pay | Admitting: Internal Medicine

## 2018-03-01 LAB — HCG, SERUM, QUALITATIVE: Preg, Serum: NEGATIVE

## 2018-03-01 LAB — THYROID STIMULATING IMMUNOGLOBULIN: TSI: <89 % baseline (ref <140)

## 2018-03-01 NOTE — Telephone Encounter (Signed)
One test still in process

## 2018-03-01 NOTE — Telephone Encounter (Signed)
One test is still in process

## 2018-03-01 NOTE — Telephone Encounter (Signed)
See My chart message

## 2018-03-27 ENCOUNTER — Other Ambulatory Visit: Payer: Self-pay | Admitting: Internal Medicine

## 2018-05-01 ENCOUNTER — Encounter: Payer: Self-pay | Admitting: Internal Medicine

## 2018-05-01 ENCOUNTER — Other Ambulatory Visit: Payer: Self-pay | Admitting: Internal Medicine

## 2018-05-01 MED ORDER — LEVOTHYROXINE SODIUM 100 MCG PO TABS: 100.0000 ug | ORAL_TABLET | Freq: Every day | ORAL | 0 refills | Status: DC

## 2018-05-01 MED ORDER — LEVOTHYROXINE SODIUM 100 MCG PO TABS: 100.0000 ug | ORAL_TABLET | Freq: Every day | ORAL | 4 refills | Status: DC

## 2018-08-22 ENCOUNTER — Telehealth: Payer: Self-pay | Admitting: Internal Medicine

## 2018-08-22 NOTE — Telephone Encounter (Signed)
I am so sorry, I do not prescribe these. Tylenol would be all I can recommend for now. (Does she have a PCP?)

## 2018-08-22 NOTE — Telephone Encounter (Signed)
Copied from CRM [id number]. Topic: Quick Communication - See Telephone Encounter >> Aug 22, 2018 11:21 AM Aretta Nip wrote: CRM for notification. See Telephone encounter for: 08/22/18.Pt has an absessed tooth and is in severe pain. Her dentist is closed till May, left message telling her to get ascript of Amoxicillen to avaid infection from PCP. She ha

## 2018-08-22 NOTE — Telephone Encounter (Signed)
Patient has called stating that she was due to get a tooth pulled and now due to the COVID-19 they have cancelled and closed the office. She is in terrible pain and would like to know if Dr.Gherghe will be able to help her get a pain relief.  Please Advise, Thanks

## 2018-08-23 NOTE — Telephone Encounter (Signed)
Pt has been notified and has understanding that Dr.Gherghe does not prescribe this

## 2018-08-28 NOTE — Progress Notes (Addendum)
Patient ID: RAELYN SCARSELLA, female   DOB: 07-23-1980, 38 y.o.   MRN: [id number]  Patient location: Home My location: Office  Referring Provider: Dr. Ross Marcus  I connected with the patient on 08/29/18 at  9:08 AM EDT by a video enabled telemedicine application and verified that I am speaking with the correct person.   I discussed the limitations of evaluation and management by telemedicine and the availability of in person appointments. The patient expressed understanding and agreed to proceed.   Details of the encounter are shown below.  HPI  Lalla Azad Rutledge-Montejano is a 38 y.o.-year-old female, initially referred by Dr. 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 Last visit 6 mo ago  At last visit, she was telling me that she was trying for pregnancy.  We checked a pregnancy test then and this was negative.  She continues to try for a pregnancy - has been trying for >1 year.  Reviewed and addended history: In 05/2013, she was seen in the ED for a peritonsillar abscess recently and was also found to have a goiter and thyrotoxicosis. Pt was started on MMI 10 mg bid and referred to me.  She developed hives on this.   She was also on Atenolol 25 mg daily >> stopped because of muscle cramps.  We checked an Uptake and scan that showed MNG and most likely Graves ds.  She stopped MMI 2 weeks prior to RAI tx, on 08/22/2013.  She became hypothyroid afterwards >> started Levothyroxine 75 mcg daily, but her dose was gradually decreased and then stopped last summer as she became thyrotoxic again. Unfortunately, she was lost for f/u afterwards as she lost her insurance.  She returned for a follow-up in 04/2016 >> was feeling poorly and TFTs were thyrotoxic >> I referred her to surgery and she had total thyroidectomy on 06/30/2016 with Dr. Gerrit Friends.  She had some neck pain after surgery, now resolved.  We  started levothyroxine soon after surgery.  We initially started 125 mcg daily, but had to decrease the dose to 100 mcg daily.   Pt is on levothyroxine 100 mcg daily, taken: - in am - fasting - at least 1-2 h from b'fast - no Ca, Fe, PPIs - + MVI at night - not on Biotin now  Latest TSH was slightly low, but we did not change her LT4 dose at that time: Lab Results  Component Value Date   TSH 0.32 (L) 02/27/2018   TSH 1.12 07/31/2017   TSH 0.01 (L) 11/28/2016   TSH 0.02 (L) 09/06/2016   TSH 0.01 (L) 05/01/2016   TSH 0.03 (L) 10/27/2014   TSH 0.01 (L) 09/11/2014   TSH <0.005 (L) 03/08/2014   TSH 0.02 (L) 02/03/2014   TSH 0.02 (L) 11/28/2013   FREET4 1.13 02/27/2018   FREET4 0.84 07/31/2017   FREET4 1.22 11/28/2016   FREET4 1.33 09/06/2016   FREET4 1.34 05/01/2016   FREET4 1.73 (H) 10/27/2014   FREET4 1.96 (H) 09/11/2014   FREET4 2.22 (H) 03/08/2014   FREET4 1.31 02/03/2014   FREET4 0.41 (L) 11/28/2013    Her TSI antibodies were initially high, but they became undetectable at last visit: Lab Results  Component Value Date   TSI <89 02/27/2018   TSI >700 (H) 05/01/2016   Pt denies: - feeling nodules in neck - hoarseness - dysphagia - choking - SOB with lying down  She started exercising at home - running on the  treadmill.  Constitutional: + weight gain/+ weight loss, no fatigue, no subjective hyperthermia, no subjective hypothermia Eyes: no blurry vision, no xerophthalmia ENT: no sore throat, + see HPI Cardiovascular: no CP/no SOB/no palpitations/no leg swelling Respiratory: no cough/no SOB/no wheezing Gastrointestinal: no N/no V/no D/no C/no acid reflux Musculoskeletal: no muscle aches/no joint aches Skin: no rashes, no hair loss Neurological: no tremors/no numbness/no tingling/no dizziness  I reviewed pt's medications, allergies, PMH, social hx, family hx, and changes were documented in the history of present illness. Otherwise, unchanged from my initial visit  note.  Past Medical History:  Diagnosis Date  . Anemia   . Anxiety   . Asthma   . Bipolar disorder (HCC)   . Depression   . Graves disease   . Graves disease   . Hypothyroidism 06/30/2016  . Sickle cell trait (HCC)   . Thyroid disease    was hyperthyroid, had radiaton in April 2015, now hyperthyroid   Past Surgical History:  Procedure Laterality Date  . COLPOSCOPY    . EXCISIONAL HEMORRHOIDECTOMY  1996  . THYROIDECTOMY N/A 06/30/2016   Procedure: TOTAL THYROIDECTOMY;  Surgeon: Darnell Level, MD;  Location: [facility] OR;  Service: General;  Laterality: N/A;  . TOTAL THYROIDECTOMY  06/30/2016   Social History   Socioeconomic History  . Marital status: Married    Spouse name: Not on file  . Number of children: Not on file  . Years of education: Not on file  . Highest education level: Not on file  Occupational History  . Not on file  Social Needs  . Financial resource strain: Not on file  . Food insecurity:    Worry: Not on file    Inability: Not on file  . Transportation needs:    Medical: Not on file    Non-medical: Not on file  Tobacco Use  . Smoking status: Current Every Day Smoker    Packs/day: 0.50    Years: 17.00    Pack years: 8.50    Types: Cigars, Cigarettes  . Smokeless tobacco: Never Used  Substance and Sexual Activity  . Alcohol use: Yes    Comment: 06/30/2016 "a beer q couple weeks"  . Drug use: Yes    Types: Marijuana    Comment: 06/30/2016 "once/week"  . Sexual activity: Yes    Birth control/protection: None  Lifestyle  . Physical activity:    Days per week: Not on file    Minutes per session: Not on file  . Stress: Not on file  Relationships  . Social connections:    Talks on phone: Not on file    Gets together: Not on file    Attends religious service: Not on file    Active member of club or organization: Not on file    Attends meetings of clubs or organizations: Not on file    Relationship status: Not on file  . Intimate partner violence:    Fear  of current or ex partner: Not on file    Emotionally abused: Not on file    Physically abused: Not on file    Forced sexual activity: Not on file  Other Topics Concern  . Not on file  Social History Narrative  . Not on file   Current Outpatient Medications on File Prior to Visit  Medication Sig Dispense Refill  . bismuth subsalicylate (PEPTO BISMOL) 262 MG/15ML suspension Take 30 mLs by mouth every 6 (six) hours as needed for indigestion.    . diphenhydrAMINE (BENADRYL) 25 MG tablet  Take 25 mg by mouth every 6 (six) hours as needed for sleep.    . levothyroxinMarland Kitchene (SYNTHROID, LEVOTHROID) 100 MCG tablet Take 1 tablet (100 mcg total) by mouth daily. 30 tablet 0  . levothyroxine (SYNTHROID, LEVOTHROID) 100 MCG tablet Take 1 tablet (100 mcg total) by mouth daily. 30 tablet 4  . naproxen (NAPROSYN) 375 MG tablet Take 1 tablet (375 mg total) by mouth 2 (two) times daily with a meal. (Patient not taking: Reported on 02/27/2018) 20 tablet 0   No current facility-administered medications on file prior to visit.    Allergies  Allergen Reactions  . Atenolol Other (See Comments)    Muscle cramps  . Methimazole Hives  . Penicillins Hives and Nausea And Vomiting     Has patient had a PCN reaction causing immediate rash, facial/tongue/throat swelling, SOB or lightheadedness with hypotension: #  #  #  YES  #  #  #  Has patient had a PCN reaction causing severe rash involving mucus membranes or skin necrosis: No Has patient had a PCN reaction that required hospitalization No Has patient had a PCN reaction occurring within the last 10 years: No If all of the above answers are "NO", then may proceed with Cephalosporin use.   . Sulfa Antibiotics Hives and Nausea And Vomiting   Family History  Problem Relation Age of Onset  . Arthritis Mother   . Hyperlipidemia Father   . Diabetes Other     PE: There were no vitals taken for this visit. There is no height or weight on file to calculate BMI. Wt  Readings from Last 3 Encounters:  02/27/18 208 lb (94.3 kg)  12/10/17 203 lb 12 oz (92.4 kg)  12/10/17 204 lb (92.5 kg)   Constitutional:  in NAD  The physical exam was not performed (virtual visit).  ASSESSMENT: 1. Postablative hypothyroidism - after thyroidectomy for Graves' disease  - CT neck (06/05/2013):  Decrease in size of right tonsil abscess, previously 11 x 17 mm, now 6 x 6 mm with decreased inflammatory changes.  Thyromegaly is noted, with 19 mm AP isthmus and substernal extent. 8 mm left thyroid nodule, with additional smaller nor nodules noted.  Prominent jugulodigastric lymph nodes, without pathologic features.  Apparent intimal thickening of the left internal carotid artery.   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  - 07/31/2013: Thyroid Uptake and scan:  4 hr radio iodine uptake is calculated at 46%, above the normal range consistent with hyperthyroidism. Images in 3 projections demonstrate diffuse enlargement of thyroid lobes bilaterally. Multiple small areas of slightly increased in decreased tracer localization identified suggesting diffuse small nodules. No dominant cold or hot nodule identified.  - 08/22/2013: RAI Tx 17.6 mCi.  She was off MMI x 2 weeks prior to the Uptake and scan  - 06/30/2016: Total thyroidectomy - Dr. Gerrit FriendsGerkin  2.  History of Graves' disease  3. Failure to conceive  PLAN:  1.  Patient with history of hypothyroidism, initially considered due to either toxic multinodular goiter versus Graves' disease, with extremely high TSI antibodies (undetectably high) pointing towards Graves' disease.  She initially had thyrotoxic symptoms: Weight loss, heat intolerance, tremors, palpitations, anxiety, which have resolved after her RAI treatment.  She developed post ablative hypothyroidism and was briefly on levothyroxine, however, she was then lost for follow-up and she had  recurrence of her thyrotoxicosis.  Therefore, I referred her for total thyroidectomy, which she had  in 2018 with Dr. Gerrit Friends.  We started her on 125 mcg daily of levothyroxine, then decreased to 100 mcg daily, which she continues today. -Reviewed latest labs with the patient, TSH was slightly low, but we did not decrease the dose at that time, as she was planning for pregnancy.  At last visit, pregnancy test was negative. -At this visit, we again discussed about TSH targets before and during pregnancy: Before pregnancy and in the first trimester TSH should be lower than 2.5, afterwards lower than 3. - pt feels good on the current dose. - we discussed about taking the thyroid hormone every day, with water, >30 minutes before breakfast, separated by >4 hours from acid reflux medications, calcium, iron, multivitamins. Pt. is taking it correctly. - will check thyroid tests when she returns to the clinic: TSH and free T4  2.  History of Graves' disease -She has mild exophthalmos most likely related to her Graves' disease -We checked her TSI antibodies at last visit and these were undetectable, which is an excellent sign that her Graves' activity has decreased considerably. -No chemosis, double vision  3. Failure to conceive - since she is 39 y/o and not conceiving in >1 year, I suggested to see a fertility specialist - we also need to make sure her TFTs are optimal  I will see her back in 6 months, but in 1 to 2 months for labs.  Carlus Pavlov, MD PhD [facility]

## 2018-08-29 ENCOUNTER — Other Ambulatory Visit: Payer: Self-pay

## 2018-08-29 ENCOUNTER — Ambulatory Visit (INDEPENDENT_AMBULATORY_CARE_PROVIDER_SITE_OTHER): Payer: Medicaid Other | Admitting: Internal Medicine

## 2018-08-29 ENCOUNTER — Encounter: Payer: Self-pay | Admitting: Internal Medicine

## 2018-08-29 DIAGNOSIS — E890 Postprocedural hypothyroidism: Secondary | ICD-10-CM

## 2018-08-29 DIAGNOSIS — N979 Female infertility, unspecified: Secondary | ICD-10-CM

## 2018-08-29 DIAGNOSIS — Z8639 Personal history of other endocrine, nutritional and metabolic disease: Secondary | ICD-10-CM

## 2018-08-29 MED ORDER — LEVOTHYROXINE SODIUM 100 MCG PO TABS: 100.0000 ug | ORAL_TABLET | Freq: Every day | ORAL | 5 refills | Status: DC

## 2018-08-29 NOTE — Patient Instructions (Signed)
Please continue Levothyroxine 100 mcg daily.  Take the thyroid hormone every day, with water, at least 30 minutes before breakfast, separated by at least 4 hours from: - acid reflux medications - calcium - iron - multivitamins  Please come back for a follow-up appointment in 6 months, but in 1-2 months for labs.

## 2018-10-30 ENCOUNTER — Other Ambulatory Visit: Payer: Self-pay

## 2018-10-30 ENCOUNTER — Telehealth: Payer: Self-pay | Admitting: Internal Medicine

## 2018-10-30 ENCOUNTER — Encounter: Payer: Self-pay | Admitting: Internal Medicine

## 2018-10-30 NOTE — Telephone Encounter (Signed)
We have not yet received a refill request. Will hold message.

## 2018-10-30 NOTE — Telephone Encounter (Signed)
Happiness from [place] ph# [phone] called re: approval of changing brand of Levothyroxine. Please contatc the above to advise.

## 2018-10-30 NOTE — Telephone Encounter (Signed)
Per patient the pharmacy has sent a refill request for levothyroxine and need office to confirm ok to dispense a particular brand.  Pharmacy [facility] [address]

## 2018-10-31 NOTE — Telephone Encounter (Signed)
Copied from duplicate message:  Shan Levans 17 hours ago (3:21 PM)     Happiness from [place] ph# [phone] called re: approval of changing brand of Levothyroxine. Please contatc the above to advise

## 2018-10-31 NOTE — Telephone Encounter (Signed)
OK 

## 2018-10-31 NOTE — Telephone Encounter (Signed)
Pharmacy was informed of information below.

## 2018-10-31 NOTE — Telephone Encounter (Signed)
Duplicate encounter

## 2019-02-11 ENCOUNTER — Encounter: Payer: Self-pay | Admitting: Internal Medicine

## 2019-02-20 ENCOUNTER — Other Ambulatory Visit: Payer: Medicaid Other

## 2019-08-11 ENCOUNTER — Telehealth: Payer: Self-pay | Admitting: Internal Medicine

## 2019-08-11 MED ORDER — LEVOTHYROXINE SODIUM 100 MCG PO TABS: 100.0000 ug | ORAL_TABLET | Freq: Every day | ORAL | 0 refills | Status: DC

## 2019-08-11 NOTE — Telephone Encounter (Signed)
RX sent

## 2019-08-11 NOTE — Telephone Encounter (Signed)
One Time Fill at [facility] [address], [place], [place]  MEDICATION: Levothyroxine  PHARMACY:  [facility] [address], [place], [place]  IS THIS A 90 DAY SUPPLY :  no  IS PATIENT OUT OF MEDICATION: left hers at home  IF NOT; HOW MUCH IS LEFT:   LAST APPOINTMENT DATE: @10 /05/2018  NEXT APPOINTMENT DATE:@Visit  date not found  DO WE HAVE YOUR PERMISSION TO LEAVE A DETAILED MESSAGE:  OTHER COMMENTS: Patient left RX at home in [place] by mistake - she is out of town with no medication   **Let patient know to contact pharmacy at the end of the day to make sure medication is ready. **  ** Please notify patient to allow 48-72 hours to process**  **Encourage patient to contact the pharmacy for refills or they can request refills through [facility]**

## 2019-09-07 ENCOUNTER — Other Ambulatory Visit: Payer: Self-pay | Admitting: Internal Medicine

## 2019-09-27 ENCOUNTER — Other Ambulatory Visit: Payer: Self-pay | Admitting: Internal Medicine

## 2019-09-29 ENCOUNTER — Other Ambulatory Visit: Payer: Self-pay

## 2019-09-29 ENCOUNTER — Telehealth: Payer: Self-pay | Admitting: Internal Medicine

## 2019-09-29 ENCOUNTER — Encounter: Payer: Self-pay | Admitting: Internal Medicine

## 2019-09-29 ENCOUNTER — Ambulatory Visit (INDEPENDENT_AMBULATORY_CARE_PROVIDER_SITE_OTHER): Payer: Medicaid Other | Admitting: Internal Medicine

## 2019-09-29 VITALS — BP 120/70 | HR 88 | Ht 63.0 in | Wt 203.0 lb

## 2019-09-29 DIAGNOSIS — E890 Postprocedural hypothyroidism: Secondary | ICD-10-CM

## 2019-09-29 DIAGNOSIS — Z8639 Personal history of other endocrine, nutritional and metabolic disease: Secondary | ICD-10-CM

## 2019-09-29 LAB — T4, FREE: Free T4: 0.96 ng/dL (ref 0.60–1.60)

## 2019-09-29 LAB — TSH: TSH: 0.33 u[IU]/mL — ABNORMAL LOW (ref 0.35–4.50)

## 2019-09-29 MED ORDER — LEVOTHYROXINE SODIUM 88 MCG PO TABS: 88.0000 ug | ORAL_TABLET | Freq: Every day | ORAL | 3 refills | Status: DC

## 2019-09-29 NOTE — Telephone Encounter (Signed)
Noted.  Will be addressed at appointment today.

## 2019-09-29 NOTE — Telephone Encounter (Signed)
Patient called requesting a refill for her synthroid and I just made her an appt for today.  Medication Refill Request  Did you call your pharmacy and request this refill first? Yes  . If patient has not contacted pharmacy first, instruct them to do so for future refills.  . Remind them that contacting the pharmacy for their refill is the quickest method to get the refill.  . Refill policy also stated that it will take anywhere between 24-72 hours to receive the refill.    Name of medication? synthroid  Is this a 90 day supply? yes  Name and location of pharmacy?  [facility] [place], [place] - [address] Phone:  [phone]  Fax:  [phone]

## 2019-09-29 NOTE — Progress Notes (Signed)
Patient ID: [name] Curry, female   DOB: May 08, 1981, 39 y.o.   MRN: [id number]  This visit occurred during the SARS-CoV-2 public health emergency.  Safety protocols were in place, including screening questions prior to the visit, additional usage of staff PPE, and extensive cleaning of exam room while observing appropriate contact time as indicated for disinfecting solutions.   HPI  [name] Curry is a 39 y.o.-year-old female, initially referred by Dr. 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 Last visit 13 months ago.  A at previous visits, she was trying for pregnancy but pregnancy tests are negative.  At last visit, I suggested a referral to fertility specialist. She will see them in 11/2017.  She now has another insurance and I am out of network.  She may not be able to return to see me  Reviewed history: In 05/2013, she was seen in the ED for a peritonsillar abscess recently and was also found to have a goiter and thyrotoxicosis. Pt was started on MMI 10 mg bid and referred to me.  She developed hives on this.   She was also on Atenolol 25 mg daily >> stopped because of muscle cramps.  We checked an Uptake and scan that showed MNG and most likely Graves ds.  She stopped MMI 2 weeks prior to RAI tx, on 08/22/2013.  She became hypothyroid afterwards >> started Levothyroxine 75 mcg daily, but her dose was gradually decreased and then stopped last summer as she became thyrotoxic again. Unfortunately, she was lost for f/u afterwards as she lost her insurance.  She returned for a follow-up in 04/2016 >> was feeling poorly and TFTs were thyrotoxic >> I referred her to surgery and she had total thyroidectomy on 06/30/2016 with Dr. Gerrit Friends.  She had some neck pain after surgery, now resolved.  We started levothyroxine soon after surgery.  We initially started 125 mcg daily, but had to decrease  the dose to 100 mcg daily.   Pt is on levothyroxine 100 mcg daily, taken: - in am - fasting - at least 30 min from b'fast - no Ca, Fe, PPIs - off MVIs at night - not on Biotin  Latest TSH was slightly low: Lab Results  Component Value Date   TSH 0.32 (L) 02/27/2018   TSH 1.12 07/31/2017   TSH 0.01 (L) 11/28/2016   TSH 0.02 (L) 09/06/2016   TSH 0.01 (L) 05/01/2016   TSH 0.03 (L) 10/27/2014   TSH 0.01 (L) 09/11/2014   TSH <0.005 (L) 03/08/2014   TSH 0.02 (L) 02/03/2014   TSH 0.02 (L) 11/28/2013   FREET4 1.13 02/27/2018   FREET4 0.84 07/31/2017   FREET4 1.22 11/28/2016   FREET4 1.33 09/06/2016   FREET4 1.34 05/01/2016   FREET4 1.73 (H) 10/27/2014   FREET4 1.96 (H) 09/11/2014   FREET4 2.22 (H) 03/08/2014   FREET4 1.31 02/03/2014   FREET4 0.41 (L) 11/28/2013    Her TSI antibodies were initially elevated but they became undetectably low at last visit: Lab Results  Component Value Date   TSI <89 02/27/2018   TSI >700 (H) 05/01/2016   Pt denies: - feeling nodules in neck - hoarseness - dysphagia - choking - SOB with lying down  No FH of thyroid cancer. No h/o radiation tx to head or neck.  No herbal supplements. No Biotin use. No recent steroids use.    ROS: Constitutional: no weight gain/no weight loss, no fatigue, no subjective hyperthermia, no  subjective hypothermia Eyes: no blurry vision, no xerophthalmia ENT: no sore throat, + see HPI Cardiovascular: no CP/no SOB/no palpitations/no leg swelling Respiratory: please see HPIno cough/no SOB/no wheezing Gastrointestinal: no N/no V/no D/no C/no acid reflux Musculoskeletal: no muscle aches/no joint aches Skin: no rashes, no hair loss Neurological: no tremors/no numbness/no tingling/no dizziness + irregular menses  I reviewed pt's medications, allergies, PMH, social hx, family hx, and changes were documented in the history of present illness. Otherwise, unchanged from my initial visit note.  Past Medical  History:  Diagnosis Date  . Anemia   . Anxiety   . Asthma   . Bipolar disorder (HCC)   . Depression   . Graves disease   . Graves disease   . Hypothyroidism 06/30/2016  . Sickle cell trait (HCC)   . Thyroid disease    was hyperthyroid, had radiaton in April 2015, now hyperthyroid   Past Surgical History:  Procedure Laterality Date  . COLPOSCOPY    . EXCISIONAL HEMORRHOIDECTOMY  1996  . THYROIDECTOMY N/A 06/30/2016   Procedure: TOTAL THYROIDECTOMY;  Surgeon: Darnell Level, MD;  Location: [facility] OR;  Service: General;  Laterality: N/A;  . TOTAL THYROIDECTOMY  06/30/2016   Social History   Socioeconomic History  . Marital status: Married    Spouse name: Not on file  . Number of children: Not on file  . Years of education: Not on file  . Highest education level: Not on file  Occupational History  . Not on file  Tobacco Use  . Smoking status: Current Every Day Smoker    Packs/day: 0.50    Years: 17.00    Pack years: 8.50    Types: Cigars, Cigarettes  . Smokeless tobacco: Never Used  Substance and Sexual Activity  . Alcohol use: Yes    Comment: 06/30/2016 "a beer q couple weeks"  . Drug use: Yes    Types: Marijuana    Comment: 06/30/2016 "once/week"  . Sexual activity: Yes    Birth control/protection: None  Other Topics Concern  . Not on file  Social History Narrative  . Not on file   Social Determinants of Health   Financial Resource Strain:   . Difficulty of Paying Living Expenses:   Food Insecurity:   . Worried About Programme researcher, broadcasting/film/video in the Last Year:   . Barista in the Last Year:   Transportation Needs:   . Freight forwarder (Medical):   Marland Kitchen Lack of Transportation (Non-Medical):   Physical Activity:   . Days of Exercise per Week:   . Minutes of Exercise per Session:   Stress:   . Feeling of Stress :   Social Connections:   . Frequency of Communication with Friends and Family:   . Frequency of Social Gatherings with Friends and Family:   . Attends  Religious Services:   . Active Member of Clubs or Organizations:   . Attends Banker Meetings:   Marland Kitchen Marital Status:   Intimate Partner Violence:   . Fear of Current or Ex-Partner:   . Emotionally Abused:   Marland Kitchen Physically Abused:   . Sexually Abused:    Current Outpatient Medications on File Prior to Visit  Medication Sig Dispense Refill  . bismuth subsalicylate (PEPTO BISMOL) 262 MG/15ML suspension Take 30 mLs by mouth every 6 (six) hours as needed for indigestion.    . diphenhydrAMINE (BENADRYL) 25 MG tablet Take 25 mg by mouth every 6 (six) hours as needed for sleep.    Marland Kitchen  levothyroxine (SYNTHROID) 100 MCG tablet Take 1 tablet (100 mcg total) by mouth daily. 30 tablet 0  . naproxen (NAPROSYN) 375 MG tablet Take 1 tablet (375 mg total) by mouth 2 (two) times daily with a meal. (Patient not taking: Reported on 02/27/2018) 20 tablet 0   No current facility-administered medications on file prior to visit.   Allergies  Allergen Reactions  . Atenolol Other (See Comments)    Muscle cramps  . Methimazole Hives  . Penicillins Hives and Nausea And Vomiting     Has patient had a PCN reaction causing immediate rash, facial/tongue/throat swelling, SOB or lightheadedness with hypotension: #  #  #  YES  #  #  #  Has patient had a PCN reaction causing severe rash involving mucus membranes or skin necrosis: No Has patient had a PCN reaction that required hospitalization No Has patient had a PCN reaction occurring within the last 10 years: No If all of the above answers are "NO", then may proceed with Cephalosporin use.   . Sulfa Antibiotics Hives and Nausea And Vomiting   Family History  Problem Relation Age of Onset  . Arthritis Mother   . Hyperlipidemia Father   . Diabetes Other     PE: BP 120/70   Pulse 88   Ht 5\' 3"  (1.6 m)   Wt 203 lb (92.1 kg)   SpO2 98%   BMI 35.96 kg/m  Body mass index is 35.96 kg/m. Wt Readings from Last 3 Encounters:  09/29/19 203 lb (92.1 kg)   02/27/18 208 lb (94.3 kg)  12/10/17 203 lb 12 oz (92.4 kg)   Constitutional: overweight, in NAD Eyes: PERRLA, EOMI, no exophthalmos ENT: moist mucous membranes, no neck masses palpated, no cervical lymphadenopathy Cardiovascular: RRR, No MRG Respiratory: CTA B Gastrointestinal: abdomen soft, NT, ND, BS+ Musculoskeletal: no deformities, strength intact in all 4 Skin: moist, warm, no rashes Neurological: no tremor with outstretched hands, DTR normal in all 4  ASSESSMENT: 1. Postablative hypothyroidism - after thyroidectomy for Graves' disease  - CT neck (06/05/2013):  Decrease in size of right tonsil abscess, previously 11 x 17 mm, now 6 x 6 mm with decreased inflammatory changes.  Thyromegaly is noted, with 19 mm AP isthmus and substernal extent. 8 mm left thyroid nodule, with additional smaller nor nodules noted.  Prominent jugulodigastric lymph nodes, without pathologic features.  Apparent intimal thickening of the left internal carotid artery.   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  - 07/31/2013: Thyroid Uptake and scan:  4 hr radio iodine uptake is calculated at 46%, above the normal range consistent with hyperthyroidism. Images in 3 projections demonstrate diffuse enlargement of thyroid lobes bilaterally. Multiple small areas of slightly increased in decreased tracer localization identified suggesting diffuse small nodules. No dominant cold or hot nodule identified.  - 08/22/2013: RAI Tx 17.6 mCi.  She was off MMI x 2 weeks prior to the Uptake and scan  - 06/30/2016: Total thyroidectomy - Dr. Harlow Asa  2.  History of Graves' disease  PLAN:  1.  Patient with history of hypothyroidism, initially considered due to either toxic multinodular goiter versus Graves' disease, with extremely high TSI antibodies, undetectably high, pointing towards Graves' disease.  She initially had thyrotoxic symptoms:  Weight loss, heat intolerance, tremors, palpitations, anxiety, which have resolved after RAI treatment.  She developed post ablative hypothyroidism and was briefly on levothyroxine, however, she was then lost for follow-up and she  had recurrence of her thyrotoxicosis.  Therefore, I referred her for total thyroidectomy which she had in 2018 with Dr. Gerrit Friends.  We initially started 125 mcg levothyroxine daily, then the dose was decreased to 100 mcg daily, which she continues today.  Last visit was virtual so we could not recheck her thyroid tests in 08/2018. -At last visit, 13 months ago, she mentioned that she wanted a pregnancy.  Since she did not conceive in a year with unprotected sexual contact, I did suggest to see fertility.  Due to the coronavirus pandemic, this was delayed until 11/2019. - latest thyroid labs reviewed with pt >> TSH was slightly suppressed: Lab Results  Component Value Date   TSH 0.32 (L) 02/27/2018   - she continues on LT4 100 mcg daily - pt feels good on this dose, but her menstrual cycles are abnormal: Increased bleeding and timed every 2 weeks.  We discussed that this can be a consequence of abnormal thyroid tests. - we discussed that after she gets pregnant, she will need an increase in the levothyroxine dose.  If I am out of network, she may need to establish care with endocrinologist in the area, or continue to follow-up with OB/GYN for this problem. - we discussed about taking the thyroid hormone every day, with water, >30 minutes before breakfast, separated by >4 hours from acid reflux medications, calcium, iron, multivitamins. Pt. is taking it correctly. - will check thyroid tests today: TSH and fT4 - If labs are abnormal, she will need to return for repeat TFTs in 1.5 months  2.  History of Graves' disease -She has mild exophthalmos most likely related to her Graves' disease -Latest TSI antibodies were undetectable -She denies signs of active Graves' ophthalmopathy:  No chemosis, no eye pain, no double vision  Needs refills.   Component     Latest Ref Rng & Units 09/29/2019  TSH     0.35 - 4.50 uIU/mL 0.33 (L)  T4,Free(Direct)     0.60 - 1.60 ng/dL 3.00   TSH is still slightly suppressed.  We will decrease the dose of levothyroxine to 88 mcg daily and recheck her test in 1.5 months.  Carlus Pavlov, MD PhD [facility]

## 2019-09-29 NOTE — Patient Instructions (Signed)
  Please continue Levothyroxine 100 mcg daily.  Take the thyroid hormone every day, with water, at least 30 minutes before breakfast, separated by at least 4 hours from: - acid reflux medications - calcium - iron - multivitamins  Please stop at the lab.  Please come back for a follow-up appointment in 1 year. 

## 2019-10-21 DIAGNOSIS — N393 Stress incontinence (female) (male): Secondary | ICD-10-CM | POA: Insufficient documentation

## 2019-10-22 DIAGNOSIS — Z803 Family history of malignant neoplasm of breast: Secondary | ICD-10-CM | POA: Insufficient documentation

## 2019-10-22 DIAGNOSIS — N920 Excessive and frequent menstruation with regular cycle: Secondary | ICD-10-CM | POA: Insufficient documentation

## 2019-10-23 DIAGNOSIS — E7800 Pure hypercholesterolemia, unspecified: Secondary | ICD-10-CM | POA: Insufficient documentation

## 2020-03-23 ENCOUNTER — Encounter: Payer: Self-pay | Admitting: Internal Medicine

## 2020-03-23 ENCOUNTER — Other Ambulatory Visit: Payer: Self-pay | Admitting: Internal Medicine

## 2020-09-14 ENCOUNTER — Other Ambulatory Visit: Payer: Self-pay | Admitting: Internal Medicine

## 2020-09-14 ENCOUNTER — Encounter: Payer: Self-pay | Admitting: Internal Medicine

## 2020-09-14 DIAGNOSIS — E890 Postprocedural hypothyroidism: Secondary | ICD-10-CM

## 2020-09-14 NOTE — Telephone Encounter (Signed)
Pt can received 3 month supply no refills Kemi [facility] [facility]

## 2020-09-28 ENCOUNTER — Encounter: Payer: Self-pay | Admitting: Internal Medicine

## 2020-09-28 ENCOUNTER — Other Ambulatory Visit: Payer: Self-pay

## 2020-09-28 ENCOUNTER — Ambulatory Visit (INDEPENDENT_AMBULATORY_CARE_PROVIDER_SITE_OTHER): Payer: Self-pay | Admitting: Internal Medicine

## 2020-09-28 VITALS — BP 128/82 | HR 77 | Ht 63.0 in | Wt 211.4 lb

## 2020-09-28 DIAGNOSIS — Z8639 Personal history of other endocrine, nutritional and metabolic disease: Secondary | ICD-10-CM

## 2020-09-28 DIAGNOSIS — E890 Postprocedural hypothyroidism: Secondary | ICD-10-CM

## 2020-09-28 LAB — TSH: TSH: 0.77 u[IU]/mL (ref 0.35–4.50)

## 2020-09-28 NOTE — Progress Notes (Signed)
Patient ID: [name] Curry, female   DOB: 08/03/1980, 40 y.o.   MRN: [id number]  This visit occurred during the SARS-CoV-2 public health emergency.  Safety protocols were in place, including screening questions prior to the visit, additional usage of staff PPE, and extensive cleaning of exam room while observing appropriate contact time as indicated for disinfecting solutions.   HPI  [name] Curry is a 40 y.o.-year-old female, initially referred by Dr. 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 Last visit 1 year ago. At last visit, she got another insurance and I was out of network.    Interim history: She continues to try to get pregnant.  She saw a fertility specialist and continues to work with them.  She had problems with insurance and could not see them recently, but she plans to return. At this visit, she denied any symptoms.  She just started to exercise and is feeling well.  Reviewed history: In 05/2013, she was seen in the ED for a peritonsillar abscess recently and was also found to have a goiter and thyrotoxicosis. Pt was started on MMI 10 mg bid and referred to me.  She developed hives on this.   She was also on Atenolol 25 mg daily >> stopped because of muscle cramps.  We checked an Uptake and scan that showed MNG and most likely Graves ds.  She stopped MMI 2 weeks prior to RAI tx, on 08/22/2013.  She became hypothyroid afterwards >> started Levothyroxine 75 mcg daily, but her dose was gradually decreased and then stopped last summer as she became thyrotoxic again. Unfortunately, she was lost for f/u afterwards as she lost her insurance.  She returned for a follow-up in 04/2016 >> was feeling poorly and TFTs were thyrotoxic >> I referred her to surgery and she had total thyroidectomy on 06/30/2016 with Dr. Gerrit Curry.  She had some neck pain after surgery, now resolved.  We started  levothyroxine soon after surgery.  We initially started 125 mcg daily, but had to decrease the dose to 100 mcg daily and then to 88 mcg daily.   Pt is on levothyroxine 88 mcg daily (dose decreased 09/2019), taken: - in am - fasting - at least 30 min from b'fast - no Ca, Fe, PPIs - prenatal MVIs at night - + Biotin 5000 mcg  - last dose 3 days.  Latest TSH was normal: 01/13/2020: TSH 0.615 Lab Results  Component Value Date   TSH 0.33 (L) 09/29/2019   TSH 0.32 (L) 02/27/2018   TSH 1.12 07/31/2017   TSH 0.01 (L) 11/28/2016   TSH 0.02 (L) 09/06/2016   TSH 0.01 (L) 05/01/2016   TSH 0.03 (L) 10/27/2014   TSH 0.01 (L) 09/11/2014   TSH <0.005 (L) 03/08/2014   TSH 0.02 (L) 02/03/2014   FREET4 0.96 09/29/2019   FREET4 1.13 02/27/2018   FREET4 0.84 07/31/2017   FREET4 1.22 11/28/2016   FREET4 1.33 09/06/2016   FREET4 1.34 05/01/2016   FREET4 1.73 (H) 10/27/2014   FREET4 1.96 (H) 09/11/2014   FREET4 2.22 (H) 03/08/2014   FREET4 1.31 02/03/2014    Her TSI antibodies were initially elevated but they became undetectably low: Lab Results  Component Value Date   TSI <89 02/27/2018   TSI >700 (H) 05/01/2016   Pt denies: - feeling nodules in neck - hoarseness - dysphagia - choking - SOB with lying down  No FH of thyroid cancer. No h/o radiation tx to  head or neck.  No herbal supplements. No Biotin use. No recent steroids use.   ROS: Constitutional: no weight gain/no weight loss, no fatigue, no subjective hyperthermia, no subjective hypothermia Eyes: no blurry vision, no xerophthalmia ENT: no sore throat, + see HPI Cardiovascular: no CP/no SOB/no palpitations/no leg swelling Respiratory: please see HPIno cough/no SOB/no wheezing Gastrointestinal: no N/no V/no D/no C/no acid reflux Musculoskeletal: no muscle aches/no joint aches Skin: no rashes, no hair loss Neurological: no tremors/no numbness/no tingling/no dizziness  I reviewed pt's medications, allergies, PMH, social hx,  family hx, and changes were documented in the history of present illness. Otherwise, unchanged from my initial visit note.  Past Medical History:  Diagnosis Date  . Anemia   . Anxiety   . Asthma   . Bipolar disorder (HCC)   . Depression   . Graves disease   . Graves disease   . Hypothyroidism 06/30/2016  . Sickle cell trait (HCC)   . Thyroid disease    was hyperthyroid, had radiaton in April 2015, now hyperthyroid   Past Surgical History:  Procedure Laterality Date  . COLPOSCOPY    . EXCISIONAL HEMORRHOIDECTOMY  1996  . THYROIDECTOMY N/A 06/30/2016   Procedure: TOTAL THYROIDECTOMY;  Surgeon: [name] Level, MD;  Location: [facility] OR;  Service: General;  Laterality: N/A;  . TOTAL THYROIDECTOMY  06/30/2016   Social History   Socioeconomic History  . Marital status: Married    Spouse name: Not on file  . Number of children: Not on file  . Years of education: Not on file  . Highest education Curry: Not on file  Occupational History  . Not on file  Tobacco Use  . Smoking status: Current Every Day Smoker    Packs/day: 0.50    Years: 17.00    Pack years: 8.50    Types: Cigars, Cigarettes  . Smokeless tobacco: Never Used  Substance and Sexual Activity  . Alcohol use: Yes    Comment: 06/30/2016 "a beer q couple weeks"  . Drug use: Yes    Types: Marijuana    Comment: 06/30/2016 "once/week"  . Sexual activity: Yes    Birth control/protection: None  Other Topics Concern  . Not on file  Social History Narrative  . Not on file   Social Determinants of Health   Financial Resource Strain: Not on file  Food Insecurity: Not on file  Transportation Needs: Not on file  Physical Activity: Not on file  Stress: Not on file  Social Connections: Not on file  Intimate Partner Violence: Not on file   Current Outpatient Medications on File Prior to Visit  Medication Sig Dispense Refill  . levothyroxine (SYNTHROID) 88 MCG tablet TAKE ONE TABLET BY MOUTH DAILY 90 tablet 0  . bismuth  subsalicylate (PEPTO BISMOL) 262 MG/15ML suspension Take 30 mLs by mouth every 6 (six) hours as needed for indigestion.    . diphenhydrAMINE (BENADRYL) 25 MG tablet Take 25 mg by mouth every 6 (six) hours as needed for sleep.    . naproxen (NAPROSYN) 375 MG tablet Take 1 tablet (375 mg total) by mouth 2 (two) times daily with a meal. 20 tablet 0   No current facility-administered medications on file prior to visit.   Allergies  Allergen Reactions  . Atenolol Other (See Comments)    Muscle cramps  . Methimazole Hives  . Penicillins Hives and Nausea And Vomiting     Has patient had a PCN reaction causing immediate rash, facial/tongue/throat swelling, SOB or lightheadedness with hypotension: #  #  #  YES  #  #  #  Has patient had a PCN reaction causing severe rash involving mucus membranes or skin necrosis: No Has patient had a PCN reaction that required hospitalization No Has patient had a PCN reaction occurring within the last 10 years: No If all of the above answers are "NO", then may proceed with Cephalosporin use.   . Sulfa Antibiotics Hives and Nausea And Vomiting   Family History  Problem Relation Age of Onset  . Arthritis Mother   . Hyperlipidemia Father   . Diabetes Other     PE: BP 128/82 (BP Location: Right Arm, Patient Position: Sitting, Cuff Size: Normal)   Pulse 77   Ht 5\' 3"  (1.6 m)   Wt 211 lb 6.4 oz (95.9 kg)   SpO2 99%   BMI 37.45 kg/m  Body mass index is 37.45 kg/m. Wt Readings from Last 3 Encounters:  09/28/20 211 lb 6.4 oz (95.9 kg)  09/29/19 203 lb (92.1 kg)  02/27/18 208 lb (94.3 kg)   Constitutional: overweight, in NAD Eyes: PERRLA, EOMI, no exophthalmos ENT: moist mucous membranes, no neck masses palpated, no cervical lymphadenopathy Cardiovascular: RRR, No MRG Respiratory: CTA B Gastrointestinal: abdomen soft, NT, ND, BS+ Musculoskeletal: no deformities, strength intact in all 4 Skin: moist, warm, no rashes Neurological: no tremor with  outstretched hands, DTR normal in all 4  ASSESSMENT: 1. Postablative hypothyroidism - after thyroidectomy for Graves' disease  - CT neck (06/05/2013):  Decrease in size of right tonsil abscess, previously 11 x 17 mm, now 6 x 6 mm with decreased inflammatory changes.  Thyromegaly is noted, with 19 mm AP isthmus and substernal extent. 8 mm left thyroid nodule, with additional smaller nor nodules noted.  Prominent jugulodigastric lymph nodes, without pathologic features.  Apparent intimal thickening of the left internal carotid artery.   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  - 07/31/2013: Thyroid Uptake and scan:  4 hr radio iodine uptake is calculated at 46%, above the normal range consistent with hyperthyroidism. Images in 3 projections demonstrate diffuse enlargement of thyroid lobes bilaterally. Multiple small areas of slightly increased in decreased tracer localization identified suggesting diffuse small nodules. No dominant cold or hot nodule identified.  - 08/22/2013: RAI Tx 17.6 mCi.  She was off MMI x 2 weeks prior to the Uptake and scan  - 06/30/2016: Total thyroidectomy - Dr. 08/28/2016  2.  History of Graves' disease  PLAN:  1.  Patient with history of hyperthyroidism, initially considered due to either toxic multinodular goiter versus Graves' disease, with very high TSI antibodies pointing towards Graves' disease.  She initially had thyrotoxic symptoms: Weight loss, heat intolerance, tremors, palpitations, anxiety, which have all resolved after RAI treatment.  She developed post ablative hypothyroidism and was briefly on levothyroxine, however, she was then lost for follow-up and she had recurrence of her thyrotoxicosis.  Therefore, I referred her for total thyroidectomy which she had in 2018 with Dr. 2019.  We initially started on 125 mcg levothyroxine but we gradually decrease the dose  afterwards. -She continues to desire pregnancy.  In the past, I suggested to see a fertility specialist >> she is now seeing them. - At last visit, TSH was suppressed so we decreased the dose of levothyroxine - a repeat TSH was normal in 12/2019 - she continues on LT4 88 mcg daily - We did discuss that before pregnancy and during the first trimester, the TSH is  ideal lower than 2.5.  Later, it is ideally lower than 3.  We also discussed about more frequent monitoring of her TFTs during pregnancy. - pt feels good on this dose. - we discussed about taking the thyroid hormone every day, with water, >30 minutes before breakfast, separated by >4 hours from acid reflux medications, calcium, iron, multivitamins. Pt. is taking it correctly. - will check thyroid tests today: TSH and fT4 - If labs are abnormal, she will need to return for repeat TFTs in 1.5 months  2.  History of Graves' disease -She has mild exophthalmos most likely related to her Graves' disease -The latest TSI antibodies are undetectable -No signs of active Graves' ophthalmopathy: No chemosis, eye pain, double vision  Component     Latest Ref Rng & Units 09/28/2020  T4,Free(Direct)     0.60 - 1.60 ng/dL 1.610.99  TSH     0.960.35 - 0.454.50 uIU/mL 0.77  TFTs are excellent.  Carlus Pavlovristina Christabell Loseke, MD PhD [facility]

## 2020-09-28 NOTE — Patient Instructions (Signed)
Please continue Levothyroxine 88 mcg daily.  Take the thyroid hormone every day, with water, at least 30 minutes before breakfast, separated by at least 4 hours from: - acid reflux medications - calcium - iron - multivitamins  Please stop at the lab.  Please come back for a follow-up appointment in 1 year. 

## 2020-09-29 ENCOUNTER — Encounter: Payer: Self-pay | Admitting: Internal Medicine

## 2020-09-29 LAB — T4, FREE: Free T4: 0.99 ng/dL (ref 0.60–1.60)

## 2020-10-04 ENCOUNTER — Encounter: Payer: Self-pay | Admitting: Internal Medicine

## 2020-10-05 ENCOUNTER — Other Ambulatory Visit: Payer: Self-pay | Admitting: Internal Medicine

## 2020-10-05 DIAGNOSIS — E669 Obesity, unspecified: Secondary | ICD-10-CM

## 2020-12-10 ENCOUNTER — Other Ambulatory Visit: Payer: Self-pay | Admitting: Internal Medicine

## 2020-12-10 DIAGNOSIS — E890 Postprocedural hypothyroidism: Secondary | ICD-10-CM

## 2020-12-10 MED ORDER — LEVOTHYROXINE SODIUM 88 MCG PO TABS: 88.0000 ug | ORAL_TABLET | Freq: Every day | ORAL | 0 refills | Status: DC

## 2021-03-09 ENCOUNTER — Other Ambulatory Visit: Payer: Self-pay | Admitting: Internal Medicine

## 2021-03-09 DIAGNOSIS — E890 Postprocedural hypothyroidism: Secondary | ICD-10-CM

## 2021-03-09 MED ORDER — LEVOTHYROXINE SODIUM 88 MCG PO TABS
88.0000 ug | ORAL_TABLET | Freq: Every day | ORAL | 0 refills | Status: DC
Start: 2021-03-09 — End: 2021-06-04

## 2021-03-21 ENCOUNTER — Emergency Department (HOSPITAL_COMMUNITY): Payer: Self-pay

## 2021-03-21 ENCOUNTER — Other Ambulatory Visit: Payer: Self-pay

## 2021-03-21 ENCOUNTER — Emergency Department (HOSPITAL_COMMUNITY)
Admission: EM | Admit: 2021-03-21 | Discharge: 2021-03-21 | Disposition: A | Discharge status: Home / Self Care | Payer: Self-pay | Attending: Emergency Medicine | Admitting: Emergency Medicine

## 2021-03-21 DIAGNOSIS — M79602 Pain in left arm: Secondary | ICD-10-CM | POA: Insufficient documentation

## 2021-03-21 DIAGNOSIS — Z5321 Procedure and treatment not carried out due to patient leaving prior to being seen by health care provider: Secondary | ICD-10-CM | POA: Insufficient documentation

## 2021-03-21 DIAGNOSIS — R0602 Shortness of breath: Secondary | ICD-10-CM | POA: Insufficient documentation

## 2021-03-21 DIAGNOSIS — R002 Palpitations: Secondary | ICD-10-CM | POA: Insufficient documentation

## 2021-03-21 LAB — BASIC METABOLIC PANEL
Anion gap: 8 (ref 5–15)
BUN: 8 mg/dL (ref 6–20)
CO2: 25 mmol/L (ref 22–32)
Calcium: 9.2 mg/dL (ref 8.9–10.3)
Chloride: 103 mmol/L (ref 98–111)
Creatinine, Ser: 0.82 mg/dL (ref 0.44–1.00)
GFR, Estimated: >60 mL/min (ref >60)
Glucose, Bld: 103 mg/dL — ABNORMAL HIGH (ref 70–99)
Potassium: 3.9 mmol/L (ref 3.5–5.1)
Sodium: 136 mmol/L (ref 135–145)

## 2021-03-21 LAB — CBC
HCT: 41 % (ref 36.0–46.0)
Hemoglobin: 13.4 g/dL (ref 12.0–15.0)
MCH: 25.9 pg — ABNORMAL LOW (ref 26.0–34.0)
MCHC: 32.7 g/dL (ref 30.0–36.0)
MCV: 79.3 fL — ABNORMAL LOW (ref 80.0–100.0)
Platelets: 343 10*3/uL (ref 150–400)
RBC: 5.17 MIL/uL — ABNORMAL HIGH (ref 3.87–5.11)
RDW: 14.7 % (ref 11.5–15.5)
WBC: 11.2 10*3/uL — ABNORMAL HIGH (ref 4.0–10.5)
nRBC: 0 % (ref 0.0–0.2)

## 2021-03-21 LAB — TROPONIN I (HIGH SENSITIVITY): Troponin I (High Sensitivity): 8 ng/L (ref <18)

## 2021-03-21 LAB — I-STAT BETA HCG BLOOD, ED (MC, WL, AP ONLY): I-stat hCG, quantitative: <5 m[IU]/mL (ref <5)

## 2021-03-21 NOTE — ED Triage Notes (Signed)
Pt reports intermittent palpitations x >1 month and then three weeks of shob, even at rest, and tingling in L arm. Advised by PCP to come to ED.

## 2021-03-21 NOTE — ED Notes (Signed)
Patient states she checked her mychart and she will show her pcp her results. Leaving d/t wait time

## 2021-03-21 NOTE — ED Provider Notes (Signed)
Emergency Medicine Provider Triage Evaluation Note  [name] Curry , a 40 y.o. female  was evaluated in triage.  Pt complains of intermittent palpitations for about 2 months.  She states that she is having numerous episodes in a day of "feeling like her heart is fluttering", with associated nonproductive cough and shortness of breath.  The symptoms are happening both with exertion and at rest.  Associated intermittent central chest pain and some tingling in her left arm which started earlier today.  Review of Systems  Positive: Chest pain, palpitations, shortness of breath, cough Negative: Fevers, congestion, leg swelling, abdominal pain  Physical Exam  BP 129/78 (BP Location: Right Arm)   Pulse 63   Temp 99.5 F (37.5 C)   Resp 20   SpO2 95%  Gen:   Awake, no distress   Resp:  Normal effort  MSK:   Moves extremities without difficulty  Other:  Irregular heart rhythm, no murmurs, lungs clear in all fields  Medical Decision Making  Medically screening exam initiated at 4:25 PM.  Appropriate orders placed.  [name] Curry was informed that the remainder of the evaluation will be completed by another provider, this initial triage assessment does not replace that evaluation, and the importance of remaining in the ED until their evaluation is complete.     [name] Curry 03/21/21 1626    [name] Dibbles, MD 03/22/21 1527

## 2021-03-22 ENCOUNTER — Other Ambulatory Visit: Payer: Self-pay

## 2021-03-22 ENCOUNTER — Encounter (HOSPITAL_COMMUNITY): Payer: Self-pay

## 2021-03-22 ENCOUNTER — Emergency Department (HOSPITAL_COMMUNITY)
Admission: EM | Admit: 2021-03-22 | Discharge: 2021-03-22 | Disposition: A | Discharge status: Home / Self Care | Payer: Medicaid Other | Attending: Emergency Medicine | Admitting: Emergency Medicine

## 2021-03-22 DIAGNOSIS — R079 Chest pain, unspecified: Secondary | ICD-10-CM | POA: Insufficient documentation

## 2021-03-22 DIAGNOSIS — Z5321 Procedure and treatment not carried out due to patient leaving prior to being seen by health care provider: Secondary | ICD-10-CM | POA: Insufficient documentation

## 2021-03-22 DIAGNOSIS — R002 Palpitations: Secondary | ICD-10-CM | POA: Insufficient documentation

## 2021-03-22 LAB — CBC WITH DIFFERENTIAL/PLATELET
Abs Immature Granulocytes: 0.06 K/uL (ref 0.00–0.07)
Basophils Absolute: 0.1 K/uL (ref 0.0–0.1)
Basophils Relative: 1 %
Eosinophils Absolute: 0.2 K/uL (ref 0.0–0.5)
Eosinophils Relative: 2 %
HCT: 43.8 % (ref 36.0–46.0)
Hemoglobin: 14 g/dL (ref 12.0–15.0)
Immature Granulocytes: 1 %
Lymphocytes Relative: 52 %
Lymphs Abs: 6.1 K/uL — ABNORMAL HIGH (ref 0.7–4.0)
MCH: 25.4 pg — ABNORMAL LOW (ref 26.0–34.0)
MCHC: 32 g/dL (ref 30.0–36.0)
MCV: 79.5 fL — ABNORMAL LOW (ref 80.0–100.0)
Monocytes Absolute: 0.8 K/uL (ref 0.1–1.0)
Monocytes Relative: 7 %
Neutro Abs: 4.3 K/uL (ref 1.7–7.7)
Neutrophils Relative %: 37 %
Platelets: 379 K/uL (ref 150–400)
RBC: 5.51 MIL/uL — ABNORMAL HIGH (ref 3.87–5.11)
RDW: 14.9 % (ref 11.5–15.5)
WBC: 11.6 K/uL — ABNORMAL HIGH (ref 4.0–10.5)
nRBC: 0 % (ref 0.0–0.2)

## 2021-03-22 LAB — URINALYSIS, ROUTINE W REFLEX MICROSCOPIC
Bacteria, UA: NONE SEEN
Bilirubin Urine: NEGATIVE
Glucose, UA: NEGATIVE mg/dL
Ketones, ur: 5 mg/dL — AB
Leukocytes,Ua: NEGATIVE
Nitrite: NEGATIVE
Protein, ur: NEGATIVE mg/dL
Specific Gravity, Urine: 1.031 — ABNORMAL HIGH (ref 1.005–1.030)
pH: 5 (ref 5.0–8.0)

## 2021-03-22 LAB — COMPREHENSIVE METABOLIC PANEL
ALT: 37 U/L (ref 0–44)
AST: 28 U/L (ref 15–41)
Albumin: 4.8 g/dL (ref 3.5–5.0)
Alkaline Phosphatase: 102 U/L (ref 38–126)
Anion gap: 8 (ref 5–15)
BUN: 12 mg/dL (ref 6–20)
CO2: 27 mmol/L (ref 22–32)
Calcium: 9.4 mg/dL (ref 8.9–10.3)
Chloride: 103 mmol/L (ref 98–111)
Creatinine, Ser: 0.79 mg/dL (ref 0.44–1.00)
GFR, Estimated: >60 mL/min (ref >60)
Glucose, Bld: 93 mg/dL (ref 70–99)
Potassium: 3.6 mmol/L (ref 3.5–5.1)
Sodium: 138 mmol/L (ref 135–145)
Total Bilirubin: 0.4 mg/dL (ref 0.3–1.2)
Total Protein: 8.1 g/dL (ref 6.5–8.1)

## 2021-03-22 LAB — TROPONIN I (HIGH SENSITIVITY): Troponin I (High Sensitivity): 3 ng/L (ref <18)

## 2021-03-22 LAB — I-STAT BETA HCG BLOOD, ED (MC, WL, AP ONLY): I-stat hCG, quantitative: <5 m[IU]/mL (ref <5)

## 2021-03-22 NOTE — ED Triage Notes (Signed)
Pt reports with chest pain and palpitations x 1 month but states that it has been getting worse over the past few days. Pt states that the palpitations are coming more frequently.

## 2021-03-22 NOTE — ED Provider Notes (Signed)
Emergency Medicine Provider Triage Evaluation Note  [name] Curry , a 40 y.o. female  was evaluated in triage.  Pt complains of intermittent palpitations and chest pain this been ongoing for the past month.  Screened at [facility] yesterday however left [facility] due to long waits.  Endorses pain has been ongoing.  She does report when episodes occur, she feels like they take her breath away.  She does endorse tobacco use, does have a history of Graves' disease but recent lab work was reassuring.  She does not have any prior history of CAD, family history of CAD, prior history of blood clots.  Review of Systems  Positive: Chest pain, shortness of breath, palpitations Negative: Fever, leg swelling  Physical Exam  There were no vitals taken for this visit. Gen:   Awake, no distress   Resp:  Normal effort  MSK:   Moves extremities without difficulty  Other:    Medical Decision Making  Medically screening exam initiated at 8:26 PM.  Appropriate orders placed.  Raymie Trani Rutledge-Polimeni was informed that the remainder of the evaluation will be completed by another provider, this initial triage assessment does not replace that evaluation, and the importance of remaining in the ED until their evaluation is complete.  Patient evaluated at [facility] yesterday, left due to long hours of wait.  Without prior history of CAD, does have history of tobacco use.  Blood work has been ordered along with EKG.   Claude Manges, PA-C 03/22/21 2037    Derwood Kaplan, MD 03/22/21 2113

## 2021-03-23 ENCOUNTER — Telehealth (HOSPITAL_COMMUNITY): Payer: Self-pay

## 2021-04-20 ENCOUNTER — Emergency Department (HOSPITAL_BASED_OUTPATIENT_CLINIC_OR_DEPARTMENT_OTHER): Payer: Self-pay

## 2021-04-20 ENCOUNTER — Encounter (HOSPITAL_BASED_OUTPATIENT_CLINIC_OR_DEPARTMENT_OTHER): Payer: Self-pay

## 2021-04-20 ENCOUNTER — Emergency Department (HOSPITAL_BASED_OUTPATIENT_CLINIC_OR_DEPARTMENT_OTHER)
Admission: EM | Admit: 2021-04-20 | Discharge: 2021-04-20 | Disposition: A | Discharge status: Home / Self Care | Payer: Self-pay | Attending: Emergency Medicine | Admitting: Emergency Medicine

## 2021-04-20 ENCOUNTER — Other Ambulatory Visit: Payer: Self-pay

## 2021-04-20 DIAGNOSIS — R0789 Other chest pain: Secondary | ICD-10-CM | POA: Insufficient documentation

## 2021-04-20 DIAGNOSIS — F17210 Nicotine dependence, cigarettes, uncomplicated: Secondary | ICD-10-CM | POA: Insufficient documentation

## 2021-04-20 DIAGNOSIS — Z79899 Other long term (current) drug therapy: Secondary | ICD-10-CM | POA: Insufficient documentation

## 2021-04-20 DIAGNOSIS — R002 Palpitations: Secondary | ICD-10-CM | POA: Insufficient documentation

## 2021-04-20 DIAGNOSIS — E039 Hypothyroidism, unspecified: Secondary | ICD-10-CM | POA: Insufficient documentation

## 2021-04-20 DIAGNOSIS — J45909 Unspecified asthma, uncomplicated: Secondary | ICD-10-CM | POA: Insufficient documentation

## 2021-04-20 LAB — CBC
HCT: 38.1 % (ref 36.0–46.0)
Hemoglobin: 12.5 g/dL (ref 12.0–15.0)
MCH: 25.5 pg — ABNORMAL LOW (ref 26.0–34.0)
MCHC: 32.8 g/dL (ref 30.0–36.0)
MCV: 77.6 fL — ABNORMAL LOW (ref 80.0–100.0)
Platelets: 331 10*3/uL (ref 150–400)
RBC: 4.91 MIL/uL (ref 3.87–5.11)
RDW: 14.6 % (ref 11.5–15.5)
WBC: 10.4 10*3/uL (ref 4.0–10.5)
nRBC: 0 % (ref 0.0–0.2)

## 2021-04-20 LAB — BASIC METABOLIC PANEL
Anion gap: 8 (ref 5–15)
BUN: 12 mg/dL (ref 6–20)
CO2: 26 mmol/L (ref 22–32)
Calcium: 8.9 mg/dL (ref 8.9–10.3)
Chloride: 103 mmol/L (ref 98–111)
Creatinine, Ser: 0.8 mg/dL (ref 0.44–1.00)
GFR, Estimated: >60 mL/min (ref >60)
Glucose, Bld: 93 mg/dL (ref 70–99)
Potassium: 3.9 mmol/L (ref 3.5–5.1)
Sodium: 137 mmol/L (ref 135–145)

## 2021-04-20 LAB — TROPONIN I (HIGH SENSITIVITY)
Troponin I (High Sensitivity): 2 ng/L (ref <18)
Troponin I (High Sensitivity): 2 ng/L (ref <18)

## 2021-04-20 LAB — D-DIMER, QUANTITATIVE: D-Dimer, Quant: 0.28 ug/mL-FEU (ref 0.00–0.50)

## 2021-04-20 NOTE — ED Notes (Signed)
Pt states she has been wearing a holter monitor ,just finished a week ago and was told she had an irregular heart beat, but no meds started yet. This chest pain is new and started 15 minute ago.

## 2021-04-20 NOTE — Discharge Instructions (Addendum)
Return to the Emergency Department if you have unusual chest pain, pressure, or discomfort, shortness of breath, nausea, vomiting, burping, heartburn, tingling upper body parts, sweating, cold, clammy skin, or racing heartbeat. Call 911 if you think you are having a heart attack. Take all cardiac medications as prescribed - notify your doctor if you have any side effects. Follow cardiac diet - avoid fatty & fried foods, don't eat too much red meat, eat lots of fruits & vegetables, and dairy products should be low fat. Please lose weight if you are overweight. Become more active with walking, gardening, or any other activity that gets you to moving.  Please follow up with your cardiologist and PCP.    Please return to the emergency department immediately for any new or concerning symptoms, or if you get worse.

## 2021-04-20 NOTE — ED Triage Notes (Signed)
Patient here POV from Home with CP.  Patient began having CP to Left Chest with associated Numbness to Upper Shoulder and Left Digits.  Patient states she was on a Heart Monitor and was found to have episodes of Ventricular Tachycardia.  NAD Noted during Triage. Moderate SOB. A&Ox4. GCS 15.

## 2021-04-21 LAB — T4, FREE: Free T4: 1 ng/dL (ref 0.61–1.12)

## 2021-04-21 LAB — TSH: TSH: 1.852 u[IU]/mL (ref 0.350–4.500)

## 2021-04-21 NOTE — ED Provider Notes (Addendum)
[name] EMERGENCY DEPT Provider Note   CSN: [id number] Arrival date & time: 04/20/21  [id number]     History Chief Complaint  Patient presents with   Chest Pain    [name] Curry is a 40 y.o. female.  This is a 40 y.o. female with significant medical history as below, including bipolar, hypothyroid who presents to the ED with complaint of cp.  Patient with intermittent chest pain over the past few weeks, last episode chest pain approximately 2 hours prior to arrival after she walked down a flight of stairs.  Pain described as squeezing, tightness, substernal.  Radiation to left shoulder.  No alleviating factors. Feels pain occurs somewhat randomly, a/w palpitations. Currently asymptomatic. Pt does report that she was recently using event recorder/holter and is scheduled to follow up w/ cardiologist in the next few days or so.     The history is provided by the patient and the spouse. No language interpreter was used.  Chest Pain Associated symptoms: palpitations   Associated symptoms: no abdominal pain, no back pain, no cough, no dysphagia, no fever, no headache, no nausea and no shortness of breath       Past Medical History:  Diagnosis Date   Anemia    Anxiety    Asthma    Bipolar disorder ([place])    Depression    Graves disease    Graves disease    Hypothyroidism 06/30/2016   Sickle cell trait ([place])    Thyroid disease    was hyperthyroid, had radiaton in April 2015, now hyperthyroid    Patient Active Problem List   Diagnosis Date Noted   H/O Graves' disease 09/05/2016   Breast lump 04/21/2014   Tobacco abuse 04/21/2014   Routine general medical examination at a health care facility 04/21/2014   Postsurgical 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     Family History  Problem Relation Age of Onset   Arthritis Mother    Hyperlipidemia Father    Diabetes Other     Social History   Tobacco Use   Smoking status: Every Day    Packs/day: 0.20    Years: 17.00    Pack years: 3.40    Types: Cigars, Cigarettes   Smokeless tobacco: Never  Substance Use Topics   Alcohol use: Yes    Comment: 06/30/2016 "a beer q couple weeks"   Drug use: Yes    Types: Marijuana    Comment: 06/30/2016 "once/week"    Home Medications Prior to Admission medications   Medication Sig Start Date End Date Taking? Authorizing Provider  bismuth subsalicylate (PEPTO BISMOL) 262 MG/15ML suspension Take 30 mLs by mouth every 6 (six) hours as needed for indigestion.    [provider]  diphenhydrAMINE (BENADRYL) 25 MG tablet Take 25 mg by mouth every 6 (six) hours as needed for sleep.    [provider]  levothyroxine (SYNTHROID) 88 MCG tablet Take 1 tablet (88 mcg total) by mouth daily. 03/09/21   Philemon Kingdom, MD  naproxen (NAPROSYN) 375 MG tablet Take 1 tablet (375 mg total) by mouth 2 (two) times daily with  a meal. 12/11/17   Pricilla Loveless, MD    Allergies    Atenolol, Methimazole, Penicillins, and Sulfa antibiotics  Review of Systems   Review of Systems  Constitutional:  Negative for activity change and fever.  HENT:  Negative for facial swelling and trouble swallowing.   Eyes:  Negative for discharge and redness.  Respiratory:  Negative for cough and shortness of breath.   Cardiovascular:  Positive for chest pain and palpitations.  Gastrointestinal:  Negative for abdominal pain and nausea.  Genitourinary:  Negative for dysuria and flank pain.  Musculoskeletal:  Negative for back pain and gait problem.  Skin:  Negative for pallor and rash.  Neurological:  Negative for  syncope and headaches.   Physical Exam Updated Vital Signs BP 102/63 (BP Location: Right Arm)   Pulse 69   Temp 98.3 F (36.8 C) (Oral)   Resp 20   Ht 5\' 3"  (1.6 m)   Wt 93 kg   SpO2 98%   BMI 36.31 kg/m   Physical Exam Vitals and nursing note reviewed.  Constitutional:      General: She is not in acute distress.    Appearance: Normal appearance. She is well-developed. She is not ill-appearing.  HENT:     Head: Normocephalic and atraumatic.     Right Ear: External ear normal.     Left Ear: External ear normal.     Nose: Nose normal.     Mouth/Throat:     Mouth: Mucous membranes are moist.  Eyes:     General: No scleral icterus.       Right eye: No discharge.        Left eye: No discharge.  Cardiovascular:     Rate and Rhythm: Normal rate and regular rhythm.     Pulses: Normal pulses.     Heart sounds: Normal heart sounds. No murmur heard. Pulmonary:     Effort: Pulmonary effort is normal. No respiratory distress.     Breath sounds: Normal breath sounds. No decreased breath sounds.  Abdominal:     General: Abdomen is flat. There is no abdominal bruit.     Palpations: Abdomen is soft.     Tenderness: There is no abdominal tenderness.  Musculoskeletal:        General: Normal range of motion.     Cervical back: Normal range of motion.     Right lower leg: No edema.     Left lower leg: No edema.  Skin:    General: Skin is warm and dry.     Capillary Refill: Capillary refill takes less than 2 seconds.  Neurological:     Mental Status: She is alert.  Psychiatric:        Mood and Affect: Mood normal.        Behavior: Behavior normal.    ED Results / Procedures / Treatments   Labs (all labs ordered are listed, but only abnormal results are displayed) Labs Reviewed  CBC - Abnormal; Notable for the following components:      Result Value   MCV 77.6 (*)    MCH 25.5 (*)    All other components within normal limits  BASIC METABOLIC PANEL  D-DIMER, QUANTITATIVE   TSH  T4, FREE  TROPONIN I (HIGH SENSITIVITY)  TROPONIN I (HIGH SENSITIVITY)    EKG None  Radiology DG Chest Portable 1 View  Result Date: 04/20/2021 CLINICAL DATA:  40 year old female with history of chest pain. EXAM: PORTABLE CHEST 1 VIEW COMPARISON:  Chest  x-ray 03/21/2021. FINDINGS: Lung volumes are normal. No consolidative airspace disease. No pleural effusions. No pneumothorax. No pulmonary nodule or mass noted. Pulmonary vasculature and the cardiomediastinal silhouette are within normal limits. IMPRESSION: No radiographic evidence of acute cardiopulmonary disease. Electronically Signed   By: Vinnie Langton M.D.   On: 04/20/2021 19:11    Procedures Procedures   Medications Ordered in ED Medications - No data to display  ED Course  I have reviewed the triage vital signs and the nursing notes.  Pertinent labs & imaging results that were available during my care of the patient were reviewed by me and considered in my medical decision making (see chart for details).    MDM Rules/Calculators/A&P                           CC: cp  This patient complains of cp; this involves an extensive number of treatment options and is a complaint that carries with it a high risk of complications and morbidity. Vital signs were reviewed. Serious etiologies considered.  Record review:   Previous records obtained and reviewed   Additional history obtained from family   Work up as above, notable for:  Labs & imaging results that were available during my care of the patient were reviewed by me and considered in my medical decision making.   I ordered imaging studies which included cxr and I independently visualized and interpreted imaging which showed stable   Low risk Wells score, D-dimer is negative.  PE is unlikely.   Cardiac monitoring reviewed and interpreted by myself shows NSR   The patient's chest pain is not suggestive of pulmonary embolus, cardiac ischemia, aortic  dissection, pericarditis, myocarditis, pulmonary embolism, pneumothorax, pneumonia, Zoster, or esophageal perforation, or other serious etiology.  Historically not abrupt in onset, tearing or ripping, pulses symmetric. EKG nonspecific for ischemia/infarction. No dysrhythmias, brugada, WPW, prolonged QT noted. Troponin negative x2. CXR reviewed. Labs without demonstration of acute pathology unless otherwise noted above. Low HEART Score: 0-3 points (0.9-1.7% risk of MACE). Given the extremely low risk of these diagnoses further testing and evaluation for these possibilities does not appear to be indicated at this time. Patient in no distress and overall condition improved here in the ED. Detailed discussions were had with the patient regarding current findings, and need for close f/u with PCP or on call doctor. The patient has been instructed to return immediately if the symptoms worsen in any way for re-evaluation. Patient verbalized understanding and is in agreement with current care plan. All questions answered prior to discharge.  Advised her to f/u with her cardiologist    This chart was dictated using voice recognition software.  Despite best efforts to proofread,  errors can occur which can change the documentation meaning.  Final Clinical Impression(s) / ED Diagnoses Final diagnoses:  Atypical chest pain    Rx / DC Orders ED Discharge Orders     None        Jeanell Sparrow, DO 04/21/21 1540    Wynona Dove A, DO 04/21/21 1540

## 2021-05-18 ENCOUNTER — Encounter (HOSPITAL_BASED_OUTPATIENT_CLINIC_OR_DEPARTMENT_OTHER): Payer: Self-pay

## 2021-06-01 ENCOUNTER — Other Ambulatory Visit: Payer: Self-pay

## 2021-06-01 ENCOUNTER — Ambulatory Visit (INDEPENDENT_AMBULATORY_CARE_PROVIDER_SITE_OTHER): Payer: 59 | Admitting: Cardiology

## 2021-06-01 ENCOUNTER — Encounter (HOSPITAL_BASED_OUTPATIENT_CLINIC_OR_DEPARTMENT_OTHER): Payer: Self-pay | Admitting: Cardiology

## 2021-06-01 VITALS — BP 96/62 | HR 82 | Ht 63.0 in | Wt 211.3 lb

## 2021-06-01 DIAGNOSIS — I4729 Other ventricular tachycardia: Secondary | ICD-10-CM

## 2021-06-01 DIAGNOSIS — F17200 Nicotine dependence, unspecified, uncomplicated: Secondary | ICD-10-CM

## 2021-06-01 DIAGNOSIS — Z7189 Other specified counseling: Secondary | ICD-10-CM

## 2021-06-01 DIAGNOSIS — R002 Palpitations: Secondary | ICD-10-CM | POA: Diagnosis not present

## 2021-06-01 DIAGNOSIS — Z7182 Exercise counseling: Secondary | ICD-10-CM

## 2021-06-01 DIAGNOSIS — Z713 Dietary counseling and surveillance: Secondary | ICD-10-CM

## 2021-06-01 DIAGNOSIS — Z716 Tobacco abuse counseling: Secondary | ICD-10-CM

## 2021-06-01 NOTE — Patient Instructions (Signed)
Medication Instructions:  °Your Physician recommend you continue on your current medication as directed.   ° °*If you need a refill on your cardiac medications before your next appointment, please call your pharmacy* ° ° °Lab Work: °None ordered today ° ° °Testing/Procedures: °Your physician has requested that you have an echocardiogram. Echocardiography is a painless test that uses sound waves to create images of your heart. It provides your doctor with information about the size and shape of your heart and how well your heart’s chambers and valves are working. This procedure takes approximately one hour. There are no restrictions for this procedure. °[address] ° ° ° °Follow-Up: °At [facility], you and your health needs are our priority.  As part of our continuing mission to provide you with exceptional heart care, we have created designated Provider Care Teams.  These Care Teams include your primary Cardiologist (physician) and Advanced Practice Providers (APPs -  Physician Assistants and Nurse Practitioners) who all work together to provide you with the care you need, when you need it. ° °We recommend signing up for the patient portal called "MyChart".  Sign up information is provided on this After Visit Summary.  MyChart is used to connect with patients for Virtual Visits (Telemedicine).  Patients are able to view lab/test results, encounter notes, upcoming appointments, etc.  Non-urgent messages can be sent to your provider as well.   °To learn more about what you can do with MyChart, go to [email or web address].   ° °Your next appointment:   °As needed ° °The format for your next appointment:   °In Person ° °Provider:   °Bridgette Christopher, MD  ° ° °

## 2021-06-01 NOTE — Progress Notes (Incomplete)
Cardiology Office Note:    Date:  06/01/2021   ID:  [name] Curry, DOB [birth year], MRN [id number]  PCP:  [name] Devoid, PA-C  Cardiologist:  None  Referring MD: Dayton Scrape*   No chief complaint on file.   History of Present Illness:    [name] Curry is a 41 y.o. female with a hx of hypothyroidism, Graves disease, sickle cell trait, anemia, asthma, bipolar disorder, and depression, who is seen as a new consult at the request of Dayton Scrape* for the evaluation and management of ventricular tachycardia.  She saw [name] Curry, [place] 03/23/2021 complaining of palpitations that occur at rest and with exertion. A Zio monitor was ordered.   [name] Curry was seen in the ED 04/20/2021 following an episode of chest pain radiating to left shoulder after walking down stairs 2 hours prior. Her chest pain had been intermittent over a few weeks, and she described it as squeezing, tightness, and substernal. She also reported her chest pain occurred somewhat randomly, associated with palpitations.  Tachycardia/palpitations: -Initial onset: 1-2 months prior to ED visit in 02/2021. -Frequency/Duration: Initially one episode per year, gradually worsened. Today she had one episode for 30 seconds, another episode a few days ago. She describes her palpitations as flutters. -Associated symptoms: Stress, -Aggravating/alleviating factors: Stress, mostly due to losses and grief. Lately she has been gaining weight due to her thyroid medication. Her palpitations seem to increase the more she gains weight.  Metabolic syndrome/Obesity: Previously 130 lbs, currently 211 lbs. She attributes weight gain to taking hormones for her hypothyroidism. -Syncope/near syncope: She frequently feels near-syncopal, during these episodes as well as during hot showers or when she bends over to wash her feet. -Prior cardiac history: -Prior workup: 3.5 day Zio  monitor which showed 1 episode of 6 beats of non-sustained ventricular tachycardia, and <1% early beats. -Prior treatment: Positions herself in front of a fan to resolve flushed sensations and near-syncope. -Possible medication interactions: -Tobacco: She is ready to quit smoking, and plans to wean off smoking with gum or patches. Currently she smokes 5-7 cigarettes a day. -Comorbidities:  -Exercise level: Previously she worked out using their home gym. She has not used it much lately due to concern with her health issues. -Labs: TSH, kidney function/electrolytes, CBC reviewed. -Cardiac ROS: no chest pain, no shortness of breath, no PND, no orthopnea, no LE edema. -Family history: Her sister had open-heart surgery when she was 41 yo. No known history of sudden cardiac death. -Current diet:   Today, she is accompanied by her husband.  When she first goes to sleep, she appears to feel short of breath and needs to cough. She is unable to sleep without drinking water. The other morning she felt like she was choking and couldn't breathe.   She denies any chest pain. No headaches, lower extremity edema or exertional symptoms.  Past Medical History:  Diagnosis Date   Anemia    Anxiety    Asthma    Bipolar disorder (Index)    Depression    Graves disease    Graves disease    Hypothyroidism 06/30/2016   Sickle cell trait ([name] Curry)    Thyroid disease    was hyperthyroid, had radiaton in April 2015, now hyperthyroid    Past Surgical History:  Procedure Laterality Date   COLPOSCOPY     EXCISIONAL [place] N/A 06/30/2016   Procedure: TOTAL THYROIDECTOMY;  Surgeon: Armandina Gemma, MD;  Location: [facility];  Service: General;  Laterality: N/A;   TOTAL THYROIDECTOMY  06/30/2016    Current Medications: Current Outpatient Medications on File Prior to Visit  Medication Sig   levothyroxine (SYNTHROID) 88 MCG tablet Take 1 tablet (88 mcg total) by mouth daily.   bismuth  subsalicylate (PEPTO BISMOL) 262 MG/15ML suspension Take 30 mLs by mouth every 6 (six) hours as needed for indigestion. (Patient not taking: Reported on 06/01/2021)   diphenhydrAMINE (BENADRYL) 25 MG tablet Take 25 mg by mouth every 6 (six) hours as needed for sleep. (Patient not taking: Reported on 06/01/2021)   naproxen (NAPROSYN) 375 MG tablet Take 1 tablet (375 mg total) by mouth 2 (two) times daily with a meal. (Patient not taking: Reported on 06/01/2021)   No current facility-administered medications on file prior to visit.     Allergies:   Atenolol, Methimazole, Penicillins, and Sulfa antibiotics   Social History   Tobacco Use   Smoking status: Every Day    Packs/day: 0.20    Years: 17.00    Pack years: 3.40    Types: Cigars, Cigarettes   Smokeless tobacco: Never  Substance Use Topics   Alcohol use: Yes    Comment: 06/30/2016 "a beer q couple weeks"   Drug use: Yes    Types: Marijuana    Comment: 06/30/2016 "once/week"    Family History: family history includes Arthritis in her mother; Diabetes in an other family member; Hyperlipidemia in her father.  ROS:   Please see the history of present illness.  Additional pertinent ROS: Constitutional: Negative for fever, night sweats, unintentional weight loss. Positive for weight gain, feeling flushed, shakes, chills. HENT: Negative for ear pain and hearing loss. Positive for tinnitus.   Eyes: Negative for loss of vision and eye pain.  Respiratory: Negative for sputum, wheezing.   Cardiovascular: See HPI. Gastrointestinal: Negative for abdominal pain, melena, and hematochezia.  Genitourinary: Negative for dysuria and hematuria.  Musculoskeletal: Negative for falls and myalgias.  Skin: Negative for itching and rash.  Neurological: Negative for focal weakness, focal sensory changes. Positive for near-syncope, lightheadedness. Endo/Heme/Allergies: Does bruise/bleed easily.   EKGs/Labs/Other Studies Reviewed:    The following  studies were reviewed today:  Zio Monitor 03/31/2021 ([place]): Records requested.***  CTA Chest 07/22/2016: COMPARISON:  Chest radiograph performed 07/21/2016, and CT of the chest performed 06/03/2013   FINDINGS: Cardiovascular:  There is no evidence of pulmonary embolus.   The heart is normal in size. The thoracic aorta is unremarkable in appearance. No calcific atherosclerotic disease is seen. The great vessels are grossly unremarkable.   Mediastinum/Nodes: No mediastinal lymphadenopathy is seen. No pericardial effusion is identified. The patient is status post thyroidectomy. The thyroid gland is grossly unremarkable in appearance. No axillary lymphadenopathy is seen.   Lungs/Pleura: The lungs are clear bilaterally. No focal consolidation, pleural effusion or pneumothorax is seen. No masses are identified.   Upper Abdomen: The visualized portions of the liver and spleen are grossly unremarkable. The visualized portions of the adrenal glands are within normal limits.   Musculoskeletal: No acute osseous abnormalities are identified. The visualized musculature is unremarkable in appearance.   Review of the MIP images confirms the above findings.   IMPRESSION: No evidence of pulmonary embolus.  Lungs clear bilaterally.  EKG:  EKG is personally reviewed.   06/01/2021: ***  Recent Labs: 03/22/2021: ALT 37 04/20/2021: BUN 12; Creatinine, Ser 0.80; Hemoglobin 12.5; Platelets 331; Potassium 3.9; Sodium 137; TSH 1.852   Recent Lipid Panel    Component  Value Date/Time   CHOL 198 04/21/2014 1111   TRIG 102.0 04/21/2014 1111   HDL 37.00 (L) 04/21/2014 1111   CHOLHDL 5 04/21/2014 1111   VLDL 20.4 04/21/2014 1111   LDLCALC 141 (H) 04/21/2014 1111    Physical Exam:    VS:  BP 96/62 (BP Location: Left Arm, Patient Position: Sitting, Cuff Size: Large)    Pulse 82    Ht 5\' 3"  (1.6 m)    Wt 211 lb 4.8 oz (95.8 kg)    SpO2 97%    BMI 37.43 kg/m     Wt Readings  from Last 3 Encounters:  06/01/21 211 lb 4.8 oz (95.8 kg)  04/20/21 205 lb (93 kg)  03/22/21 211 lb (95.7 kg)    GEN: Well nourished, well developed in no acute distress HEENT: Normal, moist mucous membranes NECK: No JVD CARDIAC: regular rhythm, normal S1 and S2, no rubs or gallops. No murmur. VASCULAR: Radial and DP pulses 2+ bilaterally. No carotid bruits RESPIRATORY:  Clear to auscultation without rales, wheezing or rhonchi  ABDOMEN: Soft, non-tender, non-distended MUSCULOSKELETAL:  Ambulates independently SKIN: Warm and dry, no edema NEUROLOGIC:  Alert and oriented x 3. No focal neuro deficits noted. PSYCHIATRIC:  Normal affect    ASSESSMENT:    1. Heart palpitations   2. NSVT (nonsustained ventricular tachycardia)   3. Tobacco abuse counseling   4. Cardiac risk counseling   5. Nutritional counseling   6. Exercise counseling    PLAN:     Cardiac risk counseling and prevention recommendations: -recommend heart healthy/Mediterranean diet, with whole grains, fruits, vegetable, fish, lean meats, nuts, and olive oil. Limit salt. -recommend moderate walking, 3-5 times/week for 30-50 minutes each session. Aim for at least 150 minutes.week. Goal should be pace of 3 miles/hours, or walking 1.5 miles in 30 minutes -recommend avoidance of tobacco products. Avoid excess alcohol. -ASCVD risk score: The ASCVD Risk score (Arnett DK, et al., 2019) failed to calculate for the following reasons:   The 2019 ASCVD risk score is only valid for ages 73 to 75    Plan for follow up:  PRN.  Buford Dresser, MD, PhD, [facility]    Medication Adjustments/Labs and Tests Ordered: Current medicines are reviewed at length with the patient today.  Concerns regarding medicines are outlined above.   No orders of the defined types were placed in this encounter.  No orders of the defined types were placed in this encounter.  There are no Patient Instructions on file for  this visit.   I,Mathew Stumpf,acting as a Education administrator for [employer], MD.,have documented all relevant documentation on the behalf of Buford Dresser, MD,as directed by  Buford Dresser, MD while in the presence of Buford Dresser, MD.  ***  Signed, Buford Dresser, MD PhD 06/01/2021 2:29 PM    [place]

## 2021-06-02 ENCOUNTER — Encounter (HOSPITAL_BASED_OUTPATIENT_CLINIC_OR_DEPARTMENT_OTHER): Payer: Self-pay

## 2021-06-04 ENCOUNTER — Other Ambulatory Visit: Payer: Self-pay | Admitting: Internal Medicine

## 2021-06-04 DIAGNOSIS — E890 Postprocedural hypothyroidism: Secondary | ICD-10-CM

## 2021-06-06 ENCOUNTER — Ambulatory Visit (INDEPENDENT_AMBULATORY_CARE_PROVIDER_SITE_OTHER): Payer: 59

## 2021-06-06 ENCOUNTER — Other Ambulatory Visit: Payer: Self-pay

## 2021-06-06 DIAGNOSIS — I4729 Other ventricular tachycardia: Secondary | ICD-10-CM | POA: Diagnosis not present

## 2021-06-06 DIAGNOSIS — R002 Palpitations: Secondary | ICD-10-CM | POA: Diagnosis not present

## 2021-06-06 LAB — ECHOCARDIOGRAM COMPLETE
AR max vel: 2.2 cm2
AV Area VTI: 2 cm2
AV Area mean vel: 1.99 cm2
AV Mean grad: 4 mmHg
AV Peak grad: 8.2 mmHg
Ao pk vel: 1.43 m/s
Area-P 1/2: 4.52 cm2
Calc EF: 69.6 %
S' Lateral: 2.57 cm
Single Plane A2C EF: 68.6 %
Single Plane A4C EF: 67.9 %

## 2021-06-06 MED ORDER — LEVOTHYROXINE SODIUM 88 MCG PO TABS: 88.0000 ug | ORAL_TABLET | Freq: Every day | ORAL | 0 refills | Status: DC

## 2021-07-02 ENCOUNTER — Emergency Department (HOSPITAL_BASED_OUTPATIENT_CLINIC_OR_DEPARTMENT_OTHER)
Admission: EM | Admit: 2021-07-02 | Discharge: 2021-07-02 | Disposition: A | Discharge status: Home / Self Care | Payer: 59 | Attending: Emergency Medicine | Admitting: Emergency Medicine

## 2021-07-02 ENCOUNTER — Encounter (HOSPITAL_BASED_OUTPATIENT_CLINIC_OR_DEPARTMENT_OTHER): Payer: Self-pay

## 2021-07-02 ENCOUNTER — Emergency Department (HOSPITAL_BASED_OUTPATIENT_CLINIC_OR_DEPARTMENT_OTHER): Payer: 59

## 2021-07-02 ENCOUNTER — Other Ambulatory Visit: Payer: Self-pay

## 2021-07-02 DIAGNOSIS — M79645 Pain in left finger(s): Secondary | ICD-10-CM | POA: Insufficient documentation

## 2021-07-02 MED ORDER — MELOXICAM 7.5 MG PO TABS: 7.5000 mg | ORAL_TABLET | Freq: Every day | ORAL | 0 refills | Status: DC

## 2021-07-02 NOTE — Discharge Instructions (Signed)
Please read and follow all provided instructions.  Your diagnoses today include:  1. Pain of left thumb     Tests performed today include: An x-ray of the affected area - does NOT show any broken bones Vital signs. See below for your results today.   Medications prescribed:  Meloxicam - anti-inflammatory pain medication  You have been prescribed an anti-inflammatory medication or NSAID. Take with food. Do not take aspirin, ibuprofen, or naproxen if taking this medication. Take smallest effective dose for the shortest duration needed for your pain. Stop taking if you experience stomach pain or vomiting.   Take any prescribed medications only as directed.  Home care instructions:  Follow any educational materials contained in this packet Use splint as needed for comfort Follow R.I.C.E. Protocol: R - rest your injury  I  - use ice on injury without applying directly to skin C - compress injury with bandage or splint E - elevate the injury as much as possible  Follow-up instructions: Please follow-up with your primary care provider or the provided orthopedic physician (bone specialist) if you continue to have significant pain in 1 week. In this case you may have a more severe injury that requires further care.   Return instructions:  Please return if your fingers are numb or tingling, appear gray or blue, or you have severe pain (also elevate the arm and loosen splint or wrap if you were given one) Please return to the Emergency Department if you experience worsening symptoms.  Please return if you have any other emergent concerns.  Additional Information:  Your vital signs today were: BP 108/78 (BP Location: Right Arm)    Pulse 63    Temp 98.1 F (36.7 C) (Oral)    Resp 12    Ht 5\' 3"  (1.6 m)    Wt 95.8 kg    SpO2 100%    BMI 37.41 kg/m  If your blood pressure (BP) was elevated above 135/85 this visit, please have this repeated by your doctor within one  month. --------------

## 2021-07-02 NOTE — ED Provider Notes (Signed)
[facility] EMERGENCY DEPT Provider Note   CSN: [id number] Arrival date & time: 07/02/21  1043     History  Chief Complaint  Patient presents with   Thumb Pain    [name] Curry is a 41 y.o. female.  Patient presents to the emergency department for 2 to 3 weeks of gradually worsening left thumb pain.  She denies acute injury.  She does chores but no other repetitive motions.  No swelling or fevers.  Pain is worse when she flexes at the IP joint.  Pain is along the ulnar aspect of the thumb.  She has been taking ibuprofen without relief.  Pain radiates into the base of the thumb.      Home Medications Prior to Admission medications   Medication Sig Start Date End Date Taking? Authorizing Provider  meloxicam (MOBIC) 7.5 MG tablet Take 1 tablet (7.5 mg total) by mouth daily. 07/02/21  Yes Renne Crigler, PA-C  levothyroxine (SYNTHROID) 88 MCG tablet Take 1 tablet (88 mcg total) by mouth daily. 06/06/21   Carlus Pavlov, MD      Allergies    Atenolol, Methimazole, Penicillins, and Sulfa antibiotics    Review of Systems   Review of Systems  Physical Exam Updated Vital Signs BP 108/78 (BP Location: Right Arm)    Pulse 63    Temp 98.1 F (36.7 C) (Oral)    Resp 12    Ht 5\' 3"  (1.6 m)    Wt 95.8 kg    SpO2 100%    BMI 37.41 kg/m  Physical Exam Vitals and nursing note reviewed.  Constitutional:      Appearance: She is well-developed.  HENT:     Head: Normocephalic and atraumatic.  Eyes:     Pupils: Pupils are equal, round, and reactive to light.  Cardiovascular:     Pulses: Normal pulses. No decreased pulses.  Musculoskeletal:        General: Tenderness present.     Cervical back: Normal range of motion and neck supple.     Comments: Left hand: Patient is able to flex at IP joint of left thumb but does so with pain.  There is tenderness to palpation along the ulnar aspect of the thumb between the IP and MCP joints.  No swelling or warmth.  Normal capillary  refill.  Remainder of hand appears normal.  Negative Finkelstein.  Skin:    General: Skin is warm and dry.  Neurological:     Mental Status: She is alert.     Sensory: No sensory deficit.     Comments: Motor, sensation, and vascular distal to the injury is fully intact.   Psychiatric:        Mood and Affect: Mood normal.    ED Results / Procedures / Treatments   Labs (all labs ordered are listed, but only abnormal results are displayed) Labs Reviewed - No data to display  EKG None  Radiology DG Finger Thumb Left  Result Date: 07/02/2021 CLINICAL DATA:  Pain to left mid thumb EXAM: LEFT THUMB 2+V COMPARISON:  None. FINDINGS: There is no evidence of fracture or dislocation. There is no evidence of arthropathy or other focal bone abnormality. Soft tissues are unremarkable. IMPRESSION: Negative. Electronically Signed   By: 08/30/2021 M.D.   On: 07/02/2021 12:02    Procedures Procedures    Medications Ordered in ED Medications - No data to display  ED Course/ Medical Decision Making/ A&P   Patient seen and examined. History obtained directly  from patient. Work-up including labs, imaging, EKG ordered in triage, if performed, were reviewed.    Imaging: Independently reviewed and interpreted.  This included: X-ray of the left thumb, agree no fracture.  Medications/Fluids: Requested patient be given a Velcro thumb spica splint  Most recent vital signs reviewed and are as follows: BP 108/78 (BP Location: Right Arm)    Pulse 63    Temp 98.1 F (36.7 C) (Oral)    Resp 12    Ht 5\' 3"  (1.6 m)    Wt 95.8 kg    SpO2 100%    BMI 37.41 kg/m   Initial impression: Left thumb pain  Home treatment plan: Change from ibuprofen to meloxicam, splint as needed for comfort.   Return instructions discussed with patient: Worsening pain, swelling, redness or fever  Follow-up instructions discussed with patient: Orthopedic referral given, patient also has PCP new patient appointment in 2  weeks.                          Medical Decision Making Amount and/or Complexity of Data Reviewed Radiology: ordered.  Risk Prescription drug management.   Patient with thumb pain.  Pain with movement.  No redness or swelling to suggest septic joint.  Fairly preserved range of motion.  X-rays negative for fracture.  Likely ligamentous injury.  Thumb is neurovascularly intact.  No indication for emergent orthopedic consultation.        Final Clinical Impression(s) / ED Diagnoses Final diagnoses:  Pain of left thumb    Rx / DC Orders ED Discharge Orders          Ordered    meloxicam (MOBIC) 7.5 MG tablet  Daily        07/02/21 1215              08/30/21, PA-C 07/02/21 1219    08/30/21, MD 07/03/21 [phone]

## 2021-07-02 NOTE — ED Triage Notes (Signed)
Patient here POV from Home with Thumb Pain.  Patient states she awoke 3 weeks ago with Left Thumb Pain that has been worsening since.  No Acute Trauma or Injury Recalled. PCP appointment in 2 weeks but Pain has worsened to where Patient cannot wait.  NAD Noted during Triage. A&Ox4. GCS 15. Ambulatory.

## 2021-07-05 ENCOUNTER — Other Ambulatory Visit: Payer: Self-pay

## 2021-07-05 ENCOUNTER — Ambulatory Visit: Payer: Self-pay

## 2021-07-05 ENCOUNTER — Ambulatory Visit (INDEPENDENT_AMBULATORY_CARE_PROVIDER_SITE_OTHER): Payer: 59 | Admitting: Dermatology

## 2021-07-05 ENCOUNTER — Ambulatory Visit (INDEPENDENT_AMBULATORY_CARE_PROVIDER_SITE_OTHER): Payer: 59 | Admitting: Orthopedic Surgery

## 2021-07-05 ENCOUNTER — Encounter: Payer: Self-pay | Admitting: Orthopedic Surgery

## 2021-07-05 VITALS — BP 122/73 | HR 73 | Ht 63.0 in | Wt 210.0 lb

## 2021-07-05 DIAGNOSIS — L732 Hidradenitis suppurativa: Secondary | ICD-10-CM

## 2021-07-05 DIAGNOSIS — M79645 Pain in left finger(s): Secondary | ICD-10-CM | POA: Diagnosis not present

## 2021-07-05 DIAGNOSIS — G8929 Other chronic pain: Secondary | ICD-10-CM

## 2021-07-05 NOTE — Addendum Note (Signed)
Addended by: Rip Harbour on: 07/05/2021 04:25 PM   Modules accepted: Orders

## 2021-07-05 NOTE — Progress Notes (Signed)
Office Visit Note   Patient: [name] Curry           Date of Birth: 1980-06-26           MRN: [id number] Visit Date: 07/05/2021              Requested by: Clemencia Course, PA-C [id number] [place],  [place] [address] PCP: Clemencia Course, PA-C   Assessment & Plan: Visit Diagnoses:  1. Chronic pain of left thumb     Plan: Discussed with patient that her left pain at the ulnar aspect of the MP joint could be consistent with a thumb UCL tear.  Interestingly, she denies any trauma to this thumb.  Like to have her see our hand therapist to have a custom thumb spica brace made.  I will also get an MRI to evaluate her thumb ulnar collateral ligament given its already been 4 weeks since her symptom onset.  Follow-Up Instructions: No follow-ups on file.   Orders:  Orders Placed This Encounter  Procedures   XR Finger Thumb Left   No orders of the defined types were placed in this encounter.     Procedures: No procedures performed   Clinical Data: No additional findings.   Subjective: Chief Complaint  Patient presents with   Left Thumb - New Patient (Initial Visit)    This is a 41 year old right-hand-dominant female who presents with pain at the left thumb in the area of the ulnar MP joint.  This is been going on for about 3 to 4 weeks.  She denies any injury or falls at that time.  She says she woke up with pain in this area.  She has been doing all of her daily activities without any form of bracing or immobilization.  She was seen in the ER this weekend where she was given a thumb spica brace.  Overall she thinks her pain has been worsening over the course of the last 3 to 4 weeks given that she still been using her thumb.   Review of Systems   Objective: Vital Signs: BP 122/73 (BP Location: Right Arm, Patient Position: Sitting, Cuff Size: Large)    Pulse 73    Ht 5\' 3"  (1.6 m)    Wt 210 lb (95.3 kg)    SpO2 98%    BMI 37.20 kg/m    Physical Exam Constitutional:      Appearance: Normal appearance.  Cardiovascular:     Rate and Rhythm: Normal rate.     Pulses: Normal pulses.  Pulmonary:     Effort: Pulmonary effort is normal.  Skin:    General: Skin is warm and dry.     Capillary Refill: Capillary refill takes less than 2 seconds.  Neurological:     Mental Status: She is alert.    Left Hand Exam   Tenderness  Left hand tenderness location: TTP at ulnar aspect of thumb MP joint.   Other  Erythema: absent Sensation: normal Pulse: present  Comments:  Significant pain w/ radial deviation of thumb with joint flexed to 30 degrees.  Symmetric laxity with firm end point. No pain w/ radial deviation with thumb extended.  No swelling at ulnar side of thumb.      Specialty Comments:  No specialty comments available.  Imaging: No results found.   PMFS History: Patient Active Problem List   Diagnosis Date Noted   H/O Graves' disease 09/05/2016   Breast lump 04/21/2014   Tobacco  abuse 04/21/2014   Routine general medical examination at a health care facility 04/21/2014   Postsurgical hypothyroidism 12/01/2013   Suicidal behavior 08/05/2011   Past Medical History:  Diagnosis Date   Anemia    Anxiety    Asthma    Bipolar disorder (HCC)    Depression    Graves disease    Graves disease    Hypothyroidism 06/30/2016   Sickle cell trait (HCC)    Thyroid disease    was hyperthyroid, had radiaton in April 2015, now hyperthyroid    Family History  Problem Relation Age of Onset   Arthritis Mother    Hyperlipidemia Father    Diabetes Other     Past Surgical History:  Procedure Laterality Date   COLPOSCOPY     EXCISIONAL HEMORRHOIDECTOMY  1996   THYROIDECTOMY N/A 06/30/2016   Procedure: TOTAL THYROIDECTOMY;  Surgeon: Darnell Level, MD;  Location: [facility] OR;  Service: General;  Laterality: N/A;   TOTAL THYROIDECTOMY  06/30/2016   Social History   Occupational History   Not on file  Tobacco Use    Smoking status: Every Day    Packs/day: 0.20    Years: 17.00    Pack years: 3.40    Types: Cigars, Cigarettes   Smokeless tobacco: Never  Substance and Sexual Activity   Alcohol use: Yes    Comment: 06/30/2016 "a beer q couple weeks"   Drug use: Yes    Types: Marijuana    Comment: 06/30/2016 "once/week"   Sexual activity: Yes    Birth control/protection: None

## 2021-07-05 NOTE — Patient Instructions (Addendum)
WARM COMPRESSES FOR 10 MINUTES AT AT TIME. TRY TO MILK IT. DO NOT BE DISCOURAGED IF NOTHING HAPPENS. LOOK OUT FOR A CALL FROM THE OFFICE REGARDING A REFERRAL TO ONE OF THE SURGEONS DOWN BELOW  Dr. Ulysees Barns - [place] - General Surgeon Dr. Ramond Dial - [facility] - Plastic Surgeon

## 2021-07-07 ENCOUNTER — Encounter: Payer: Self-pay | Admitting: Rehabilitative and Restorative Service Providers"

## 2021-07-07 ENCOUNTER — Ambulatory Visit (INDEPENDENT_AMBULATORY_CARE_PROVIDER_SITE_OTHER): Payer: 59 | Admitting: Rehabilitative and Restorative Service Providers"

## 2021-07-07 ENCOUNTER — Other Ambulatory Visit: Payer: Self-pay

## 2021-07-07 DIAGNOSIS — M25542 Pain in joints of left hand: Secondary | ICD-10-CM | POA: Diagnosis not present

## 2021-07-07 DIAGNOSIS — M6281 Muscle weakness (generalized): Secondary | ICD-10-CM

## 2021-07-07 NOTE — Therapy (Signed)
OUTPATIENT OCCUPATIONAL THERAPY ORTHO EVALUATION  Patient Name: [name] Curry MRN: [id number] DOB:[birth year], 41 y.o., female Today's Date: 07/07/2021  PCP: Kathreen Devoid, PA-C REFERRING PROVIDER: Sherilyn Cooter, MD   OT End of Session - 07/07/21 1622     Visit Number 1    Number of Visits 12    Date for OT Re-Evaluation 09/01/21    OT Start Time 1600    OT Stop Time 1651    OT Time Calculation (min) 51 min    Activity Tolerance Patient tolerated treatment well;No increased pain;Patient limited by pain    Behavior During Therapy [facility] for tasks assessed/performed             Past Medical History:  Diagnosis Date   Anemia    Anxiety    Asthma    Bipolar disorder ([place])    Depression    Graves disease    Graves disease    Hypothyroidism 06/30/2016   Sickle cell anemia (HCC)    Sickle cell trait ([place])    Thyroid disease    was hyperthyroid, had radiaton in April 2015, now hyperthyroid   Past Surgical History:  Procedure Laterality Date   COLPOSCOPY     EXCISIONAL [place] N/A 06/30/2016   Procedure: TOTAL THYROIDECTOMY;  Surgeon: Armandina Gemma, MD;  Location: [facility];  Service: General;  Laterality: N/A;   TOTAL THYROIDECTOMY  06/30/2016   Patient Active Problem List   Diagnosis Date Noted   H/O Graves' disease 09/05/2016   Breast lump 04/21/2014   Tobacco abuse 04/21/2014   Routine general medical examination at a health care facility 04/21/2014   Postsurgical hypothyroidism 12/01/2013   Suicidal behavior 08/05/2011    ONSET DATE: ~1 month onset of pain, sudden onset after 1 night, idiopathic   REFERRING DIAG: M79.645,G89.29 (ICD-10-CM) - Chronic pain of left thumb  THERAPY DIAG:  Pain in joint of left hand  Muscle weakness (generalized)  SUBJECTIVE:   SUBJECTIVE STATEMENT:    Pleasant 41 y/o female with husband today. She states falling asleep on hand one night, waking up with numb hand and thumb was very  painful. Was in "20/10" pain when doing simple things like pulling up sheets, fixing hair, etc. She went to emergency 07/02/21 who put in long thumb spica brace and sent to hand doc who has referred here for possible Lt thumb MCP UCL tear/strain. Her husband states she is prone to forget about not using hand and he reminds her to use caution.   PERTINENT HISTORY:  Per MD order: "Left thumb UCL injury.  Conservative management for now."  No hx of trauma. Thumb orthotic ordered. She has PmHx of anxiety, bipolar, Grave's disease, thyroidectomy, is a smoker.   PAIN:  Are you having pain? No NPRS scale: 0/10 Pain location: Lt thumb MCP J   Pain orientation: Left  PAIN TYPE: aching Pain description: intermittent  Aggravating factors: bumping, squeezing, use  Relieving factors: rest, bracing, ice  PRECAUTIONS: None  HAND DOMINANCE: Right  WEIGHT BEARING RESTRICTIONS  NWB until MRI and MD clears  FALLS: Has patient fallen in last 6 months? Yes, Number of falls: ~5 months ago fell in shower   PLOF: Independent  PATIENT GOALS Have no pain, get motion and strength back for daily activities   OBJECTIVE:   DIAGNOSTIC FINDINGS: MRI pending  COGNITION: Overall cognitive status: Within functional limits for tasks assessed  ADLs: Overall ADLs: Pt has pain/problems with lifting/carrying home objects, pinching and pulling  sheets, folding laundry, opening containers, etc.   FUNCTIONAL OUTCOME MEASURES: Quick Dash TBD next session   SENSATION: Light touch: Appears intact Stereognosis: Appears intact Hot/Cold: Appears intact Proprioception: Appears intact  COORDINATION:  9 Hole Peg test: Right: TBD sec; Left: TBD sec Box and Blocks: Comments: TBD as needed FMS testing   EDEMA: minimal: 6.5cm Lt thumb base vs 6.4cm Rt thumb base, circumferentially   MUSCLE TONE: WNL   SKIN INTEGRITY: no breakdown  PALPATION: TTP around Lt thumb MCP J, no provacative testing done due to injury    UE AROM/PROM:  A/PROM Left 07/07/2021  Wrist flexion 59  Wrist extension 70  Wrist pronation 85  Wrist supination 85  (Blank rows = not tested)  HAND A/PROM:  A/PROM Left 07/07/2021  Thumb MCP (0-60) 0-54  Thumb IP (0-80) (+20) - 55  Thumb Palmar abd/add  26-45  Thumb opposition to index WNL  (Blank rows = not tested)  UE MMT: NT in hand due to injury, WNL proximally   HAND FUNCTION:  Grip strength: Right: 68.5 lbs; Left: TBD lbs Lateral pinch: Right: 18 lbs, Left: TBD lbs  Comments: taken in Rt hand for goals   TODAY'S TREATMENT:  07/07/21 Eval: OT educates pt and spouse on importance of avoiding nerve compression with poor sleeping positions, especially since she may have injured her thumb like this. Also edu to not use Lt hand for daily tasks (as she states attempting) until MRI and MD or therapist clears her. They state understanding.   OT also fabricates custom thumb short opponens orthotic with extra radial material to prevent stress to UCL.  She states fits well, no pain, no rubbing and keeps MCP still when attempting to move thumb. She was educated to be cautious, wear orthotic all the time (off for showers with no use of Lt hand) and if wrist motion in night is painful, wear long arm prefab splint in night instead.    PATIENT EDUCATION: Education details: see above tx section for details  Person educated: Patient Education method: Explanation, Demonstration, and Handouts Education comprehension: verbalized understanding, returned demonstration, and needs further education   HOME EXERCISE PROGRAM: TBD post MRI etc.   ASSESSMENT:  CLINICAL IMPRESSION: Patient is a 41 y.o. female who was seen today for occupational therapy evaluation and treatment for Lt thumb pain, weakness, etc.   PERFORMANCE DEFICITS in functional skills including ADLs, IADLs, coordination, dexterity, edema, ROM, strength, pain, flexibility, [place], body mechanics, endurance, and UE functional  use, cognitive skills including safety awareness, and psychosocial skills including routines and behaviors.   These impairments are limiting patient from ADLs, IADLs, rest and sleep, work, play, leisure, and social participation.   COMORBIDITIES may have co-morbidities  that affects occupational performance. Patient will benefit from skilled OT to address above impairments and improve overall function.  MODIFICATION OR ASSISTANCE TO COMPLETE EVALUATION: No modification of tasks or assist necessary to complete an evaluation.  OT OCCUPATIONAL PROFILE AND HISTORY: Problem focused assessment: Including review of records relating to presenting problem.  CLINICAL DECISION MAKING: LOW - limited treatment options, no task modification necessary  REHAB POTENTIAL: Excellent  EVALUATION COMPLEXITY: Low     GOALS: Goals reviewed with patient? No  SHORT TERM GOALS: (STG required if POC>30 days)  STG Name Target Date Goal status  1 Obtain custom orthotic to support injured thumb during daily routines 07/07/21 MET  2 Pending MRI/MD notes, she will improve Lt thumb flex A/ROM by at least 20* total for better mobility.  08/05/21 INITIAL  3 Pt will complete Quick DASH assessment next session to determine fnl needs, potential compensation and modifications.  07/15/21 INITIAL   LONG TERM GOALS:   LTG Name Target Date Goal status  1 Pt will have at least 40# pain free grip in Lt hand for home fnl activities  09/02/21 INITIAL  2 Pt will improve Lt wrist flex A/ROM from 59* to at least 70* for better mobility when grasping 09/02/21 INITIAL  3 Pt will have less than 15% impairment per Quick DASH fnl assessment (baseline value will be established next session)  09/02/21 INITIAL   PLAN: OT FREQUENCY: 1-2x/week  OT DURATION: 8 weeks  PLANNED INTERVENTIONS: self care/ADL training, therapeutic exercise, therapeutic activity, neuromuscular re-education, manual therapy, passive range of motion, splinting,  ultrasound, fluidotherapy, compression bandaging, moist heat, cryotherapy, contrast bath, patient/family education, and coping strategies training  PLAN FOR NEXT SESSION: Orthotic check, check MD and MRI results and move forward per orders/protocols with A/P/ROM and PRE when appropriate for situation   RECOMMENDED OTHER SERVICES: none other now   CONSULTED AND AGREED WITH PLAN OF CARE: Patient and family member/caregiver   Benito Mccreedy, OTR/L, CHT 07/07/2021, 5:17 PM

## 2021-07-10 ENCOUNTER — Ambulatory Visit (HOSPITAL_COMMUNITY)
Admission: RE | Admit: 2021-07-10 | Discharge: 2021-07-10 | Disposition: A | Discharge status: Home / Self Care | Payer: 59 | Source: Ambulatory Visit | Attending: Orthopedic Surgery | Admitting: Orthopedic Surgery

## 2021-07-10 DIAGNOSIS — G8929 Other chronic pain: Secondary | ICD-10-CM | POA: Diagnosis present

## 2021-07-10 DIAGNOSIS — M79645 Pain in left finger(s): Secondary | ICD-10-CM | POA: Diagnosis not present

## 2021-07-11 ENCOUNTER — Encounter: Payer: Self-pay | Admitting: Dermatology

## 2021-07-11 ENCOUNTER — Telehealth: Payer: Self-pay | Admitting: Dermatology

## 2021-07-11 NOTE — Telephone Encounter (Signed)
We referred her to Dr Ilsa Iha +and he's out of network. Spoke to [facility] + they are in her network and perform the procedure but they need a referral

## 2021-07-11 NOTE — Telephone Encounter (Signed)
Duplicate msg  Release:  [id number]

## 2021-07-12 ENCOUNTER — Ambulatory Visit (INDEPENDENT_AMBULATORY_CARE_PROVIDER_SITE_OTHER): Payer: 59 | Admitting: Orthopedic Surgery

## 2021-07-12 ENCOUNTER — Encounter: Payer: Self-pay | Admitting: Orthopedic Surgery

## 2021-07-12 ENCOUNTER — Telehealth: Payer: Self-pay

## 2021-07-12 ENCOUNTER — Other Ambulatory Visit: Payer: Self-pay

## 2021-07-12 DIAGNOSIS — M79645 Pain in left finger(s): Secondary | ICD-10-CM | POA: Diagnosis not present

## 2021-07-12 NOTE — Telephone Encounter (Signed)
Patient called into the office and wanted a call back regarding her MRI results.  She would like a call back today

## 2021-07-12 NOTE — Progress Notes (Signed)
Office Visit Note   Patient: [name] Curry           Date of Birth: [birth year]           MRN: [id number] Visit Date: 07/12/2021              Requested by: Clemencia Course, PA-C [id number] [place],  [place] [address] PCP: Clemencia Course, PA-C   Assessment & Plan: Visit Diagnoses:  1. Pain of left thumb     Plan: Patient had MRI performed on Sunday but was told by radiology that she needed to have the exam repeated.  She had a repeat exam yesterday evening around 8 PM.  I reviewed the MRI which demonstrates an intermediate, chronic, partial thickness tear of the UCL from its distal insertion.  We will plan to treat this conservatively w/ immobilization and hand therapy.   Follow-Up Instructions: No follow-ups on file.   Orders:  No orders of the defined types were placed in this encounter.  No orders of the defined types were placed in this encounter.     Procedures: No procedures performed   Clinical Data: No additional findings.   Subjective: Chief Complaint  Patient presents with   Left Hand - Follow-up    This is a 41 year old right-hand-dominant female who presents for follow up of pain at the left thumb in the area of the ulnar MP joint.  This been going on for a month or so.  She was seen by therapy and given a thumb spica brace.  She recently underwent MRI on Sunday but had to return for repeat MRI on Monday secondary to possible technical issues with the first exam.   Review of Systems   Objective: Vital Signs: There were no vitals taken for this visit.  Physical Exam Constitutional:      Appearance: Normal appearance.  Cardiovascular:     Rate and Rhythm: Normal rate.     Pulses: Normal pulses.  Pulmonary:     Effort: Pulmonary effort is normal.  Skin:    General: Skin is warm and dry.     Capillary Refill: Capillary refill takes less than 2 seconds.  Neurological:     Mental Status: She is alert.    Left  Hand Exam   Tenderness  Left hand tenderness location: TTP at ulnar aspect of thumb MP joint.   Other  Erythema: absent Sensation: normal Pulse: present  Comments:  Pain w/ radial deviation mostly in slight flexion.  Still feels like firm end point w/ similar laxity to contralateral side.      Specialty Comments:  No specialty comments available.  Imaging: No results found.   PMFS History: Patient Active Problem List   Diagnosis Date Noted   Pain of left thumb 07/12/2021   H/O Graves' disease 09/05/2016   Breast lump 04/21/2014   Tobacco abuse 04/21/2014   Routine general medical examination at a health care facility 04/21/2014   Postsurgical hypothyroidism 12/01/2013   Suicidal behavior 08/05/2011   Past Medical History:  Diagnosis Date   Anemia    Anxiety    Asthma    Bipolar disorder (HCC)    Depression    Graves disease    Graves disease    Hypothyroidism 06/30/2016   Sickle cell anemia (HCC)    Sickle cell trait (HCC)    Thyroid disease    was hyperthyroid, had radiaton in April 2015, now hyperthyroid    Family History  Problem Relation Age of Onset   Arthritis Mother    Hyperlipidemia Father    Diabetes Other     Past Surgical History:  Procedure Laterality Date   COLPOSCOPY     EXCISIONAL HEMORRHOIDECTOMY  1996   THYROIDECTOMY N/A 06/30/2016   Procedure: TOTAL THYROIDECTOMY;  Surgeon: Darnell Level, MD;  Location: [facility] OR;  Service: General;  Laterality: N/A;   TOTAL THYROIDECTOMY  06/30/2016   Social History   Occupational History   Not on file  Tobacco Use   Smoking status: Every Day    Packs/day: 0.20    Years: 17.00    Pack years: 3.40    Types: Cigars, Cigarettes   Smokeless tobacco: Never  Substance and Sexual Activity   Alcohol use: Yes    Comment: 06/30/2016 "a beer q couple weeks"   Drug use: Yes    Types: Marijuana    Comment: 06/30/2016 "once/week"   Sexual activity: Yes    Birth control/protection: None

## 2021-07-13 ENCOUNTER — Encounter: Payer: 59 | Admitting: Rehabilitative and Restorative Service Providers"

## 2021-07-14 ENCOUNTER — Other Ambulatory Visit: Payer: Self-pay

## 2021-07-14 ENCOUNTER — Ambulatory Visit (INDEPENDENT_AMBULATORY_CARE_PROVIDER_SITE_OTHER): Payer: 59 | Admitting: Nurse Practitioner

## 2021-07-14 ENCOUNTER — Encounter (HOSPITAL_BASED_OUTPATIENT_CLINIC_OR_DEPARTMENT_OTHER): Payer: Self-pay | Admitting: Nurse Practitioner

## 2021-07-14 VITALS — BP 117/80 | HR 97 | Ht 63.0 in | Wt 208.0 lb

## 2021-07-14 DIAGNOSIS — G4700 Insomnia, unspecified: Secondary | ICD-10-CM | POA: Insufficient documentation

## 2021-07-14 DIAGNOSIS — E890 Postprocedural hypothyroidism: Secondary | ICD-10-CM | POA: Diagnosis not present

## 2021-07-14 DIAGNOSIS — D573 Sickle-cell trait: Secondary | ICD-10-CM | POA: Diagnosis not present

## 2021-07-14 DIAGNOSIS — Z0000 Encounter for general adult medical examination without abnormal findings: Secondary | ICD-10-CM

## 2021-07-14 DIAGNOSIS — Z6836 Body mass index (BMI) 36.0-36.9, adult: Secondary | ICD-10-CM | POA: Insufficient documentation

## 2021-07-14 DIAGNOSIS — N979 Female infertility, unspecified: Secondary | ICD-10-CM | POA: Diagnosis not present

## 2021-07-14 MED ORDER — HYDROXYZINE HCL 50 MG PO TABS: 50.0000 mg | ORAL_TABLET | Freq: Every evening | ORAL | 6 refills | Status: DC | PRN
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
## 2021-07-14 NOTE — Assessment & Plan Note (Signed)
Positive sickle cell screening as a child.  At this time information and education is desired on aspects of condition and concerns for fertility and overall health. Information provided to patient at visit today. Recommend that she have evaluation with sickle cell clinic for complete review of concerns and findings consistent with sickle cell trait as well as information on fertility and family-planning.   We will plan to research more information for patient and pass along as available.

## 2021-07-14 NOTE — Assessment & Plan Note (Signed)
BMI 36.85. History of Graves' disease with thyroidectomy and resulting significant weight gain with difficulty losing weight.  At this time patient is not on any medications to help with weight loss.  Weight loss may help patient with her fertility journey however medication options are not safe while attempting pregnancy. Will obtain labs today for evaluation and consider possible GLP-1 medication if patient is not considering pregnancy in the next few months.  We will need to monitor closely if pregnancy is attained as medication will need to be stopped immediately. We will follow-up based on lab results.

## 2021-07-14 NOTE — Assessment & Plan Note (Signed)
Chronic nighttime insomnia with difficulty falling asleep and staying asleep. Patient does have positive PHQ score as well as GAD score today. Discussed some medication options that may be helpful for management of her current symptoms.  She is agreeable to trial hydroxyzine at bedtime to see if this is helpful for her sleep and increased anxiety symptoms at night. Discussed with patient that there are other medications that may be beneficial such as an SSRI to help with symptom management.  At this time we will plan to begin hydroxyzine and patient will follow-up if she feels additional treatments are necessary.

## 2021-07-14 NOTE — Assessment & Plan Note (Signed)
Difficulty conceiving despite no current use of contraceptive methods. Patient is concerned that her sickle cell trait could be contributing to this.  Unable to find evidence in the literature during evaluation today that would corroborate this idea however discussed with patient that I will continue to research.  In the meantime we will refer to sickle cell clinic for further information and discussion as she is planning on IVF in the near future. We will obtain labs today for further evaluation to ensure that there are no other etiologies present that could be contributing to her difficulty conceiving.

## 2021-07-14 NOTE — Assessment & Plan Note (Signed)
Chronic.  Currently followed with endocrinology. Review of most recent labs from endocrinology today with patient.  Thyroid appears to be well controlled. Patient continues to experience difficulty with weight loss despite optimal thyroid levels. We will obtain labs today for evaluation and make recommendations to changes in plan of care based on findings.

## 2021-07-14 NOTE — Patient Instructions (Addendum)
Thank you for choosing [facility] for your Primary Care needs. I am excited for the opportunity to partner with you to meet your health care goals. It was a pleasure meeting you today!  Recommendations from today's visit: I have sent a referral to the sickle cell clinic to help get some answers for you I am going to research the weight loss options that may be helpful for you. I will let you know what I find out.  I will let you know what your labs show and if we need to address anything based on these We will try hydroxyzine to help you sleep.   Information on diet, exercise, and health maintenance recommendations are listed below. This is information to help you be sure you are on track for optimal health and monitoring.   Please look over this and let us know if you have any questions or if you have completed any of the health maintenance outside of [place] so that we can be sure your records are up to date.  ___________________________________________________________ About Me: I am an Adult-Geriatric Nurse Practitioner with a background in caring for patients for more than 20 years with a strong intensive care background. I provide primary care and sports medicine services to patients age 62 and older within this office. My education had a strong focus on caring for the older adult population, which I am passionate about. I am also the director of the APP Fellowship with [facility].   My desire is to provide you with the best service through preventive medicine and supportive care. I consider you a part of the medical team and value your input. I work diligently to ensure that you are heard and your needs are met in a safe and effective manner. I want you to feel comfortable with me as your provider and want you to know that your health concerns are important to me.  For your information, our office hours are: Monday, Tuesday, and Thursday 8:00 AM - 5:00  PM Wednesday and Friday 8:00 AM - 12:00 PM.   In my time away from the office I am teaching new APP's within the system and am unavailable, but my partner, Dr. Burnard Bunting is in the office for emergent needs.   If you have questions or concerns, please call our office at [phone] or send [place] a MyChart message and we will respond as quickly as possible.  ____________________________________________________________ MyChart:  For all urgent or time sensitive needs we ask that you please call the office to avoid delays. Our number is [phone]. MyChart is not constantly monitored and due to the large volume of messages a day, replies may take up to 72 business hours.  MyChart Policy: MyChart allows for you to see your visit notes, after visit summary, provider recommendations, lab and tests results, make an appointment, request refills, and contact your provider or the office for non-urgent questions or concerns. Providers are seeing patients during normal business hours and do not have built in time to review MyChart messages.  We ask that you allow a minimum of 3 business days for responses to [employer]. For this reason, please do not send urgent requests through [place]. Please call the office at [phone]. New and ongoing conditions may require a visit. We have virtual and in person visit available for your convenience.  Complex MyChart concerns may require a visit. Your provider may request you schedule a virtual or in person visit to  ensure we are providing the best care possible. MyChart messages sent after 11:00 AM on Friday will not be received by the provider until Monday morning.    Lab and Test Results: You will receive your lab and test results on MyChart as soon as they are completed and results have been sent by the lab or testing facility. Due to this service, you will receive your results BEFORE your provider.  I review lab and tests results each morning prior to seeing  patients. Some results require collaboration with other providers to ensure you are receiving the most appropriate care. For this reason, we ask that you please allow a minimum of 3-5 business days from the time the ALL results have been received for your provider to receive and review lab and test results and contact you about these.  Most lab and test result comments from the provider will be sent through [place]. Your provider may recommend changes to the plan of care, follow-up visits, repeat testing, ask questions, or request an office visit to discuss these results. You may reply directly to this message or call the office at [phone] to provide information for the provider or set up an appointment. In some instances, you will be called with test results and recommendations. Please let us know if this is preferred and we will make note of this in your chart to provide this for you.    If you have not heard a response to your lab or test results in 5 business days from all results returning to [place], please call the office to let us know. We ask that you please avoid calling prior to this time unless there is an emergent concern. Due to high call volumes, this can delay the resulting process.  After Hours: For all non-emergency after hours needs, please call the office at [phone] and select the option to reach the on-call provider service. On-call services are shared between multiple [facility] offices and therefore it will not be possible to speak directly with your provider. On-call providers may provide medical advice and recommendations, but are unable to provide refills for maintenance medications.  For all emergency or urgent medical needs after normal business hours, we recommend that you seek care at the closest Urgent Care or Emergency Department to ensure appropriate treatment in a timely manner.  [facility] [place] has a 24 hour emergency room located on the ground  floor for your convenience.   Urgent Concerns During the Business Day Providers are seeing patients from 8AM to [place] with a busy schedule and are most often not able to respond to non-urgent calls until the end of the day or the next business day. If you should have URGENT concerns during the day, please call and speak to the nurse or schedule a same day appointment so that we can address your concern without delay.   Thank you, again, for choosing me as your health care partner. I appreciate your trust and look forward to learning more about you.   Worthy Keeler, DNP, AGNP-c ___________________________________________________________  Health Maintenance Recommendations Screening Testing Mammogram Every 1 -2 years based on history and risk factors Starting at age 85 Pap Smear Ages 21-39 every 3 years Ages 40-65 every 5 years with HPV testing More frequent testing may be required based on results and history Colon Cancer Screening Every 1-10 years based on test performed, risk factors, and history Starting at age 64 Bone Density Screening Every 2-10 years based on history  Starting at age 5 for women Recommendations for men differ based on medication usage, history, and risk factors AAA Screening One time ultrasound Men 19-49 years old who have every smoked Lung Cancer Screening Low Dose Lung CT every 12 months Age 17-80 years with a 30 pack-year smoking history who still smoke or who have quit within the last 15 years  Screening Labs Routine  Labs: Complete Blood Count (CBC), Complete Metabolic Panel (CMP), Cholesterol (Lipid Panel) Every 6-12 months based on history and medications May be recommended more frequently based on current conditions or previous results Hemoglobin A1c Lab Every 3-12 months based on history and previous results Starting at age 52 or earlier with diagnosis of diabetes, high cholesterol, BMI >26, and/or risk factors Frequent monitoring for patients  with diabetes to ensure blood sugar control Thyroid Panel (TSH w/ T3 & T4) Every 6 months based on history, symptoms, and risk factors May be repeated more often if on medication HIV One time testing for all patients 3 and older May be repeated more frequently for patients with increased risk factors or exposure Hepatitis C One time testing for all patients 72 and older May be repeated more frequently for patients with increased risk factors or exposure Gonorrhea, Chlamydia Every 12 months for all sexually active persons 13-24 years Additional monitoring may be recommended for those who are considered high risk or who have symptoms PSA Men 62-8 years old with risk factors Additional screening may be recommended from age 12-69 based on risk factors, symptoms, and history  Vaccine Recommendations Tetanus Booster All adults every 10 years Flu Vaccine All patients 6 months and older every year COVID Vaccine All patients 12 years and older Initial dosing with booster May recommend additional booster based on age and health history HPV Vaccine 2 doses all patients age 65-26 Dosing may be considered for patients over 26 Shingles Vaccine (Shingrix) 2 doses all adults 39 years and older Pneumonia (Pneumovax 23) All adults 58 years and older May recommend earlier dosing based on health history Pneumonia (Prevnar 29) All adults 15 years and older Dosed 1 year after Pneumovax 23  Additional Screening, Testing, and Vaccinations may be recommended on an individualized basis based on family history, health history, risk factors, and/or exposure.  __________________________________________________________  Diet Recommendations for All Patients  I recommend that all patients maintain a diet low in saturated fats, carbohydrates, and cholesterol. While this can be challenging at first, it is not impossible and small changes can make big differences.  Things to try: Decreasing the amount of  soda, sweet tea, and/or juice to one or less per day and replace with water While water is always the first choice, if you do not like water you may consider adding a water additive without sugar to improve the taste other sugar free drinks Replace potatoes with a brightly colored vegetable at dinner Use healthy oils, such as canola oil or olive oil, instead of butter or hard margarine Limit your bread intake to two pieces or less a day Replace regular pasta with low carb pasta options Bake, broil, or grill foods instead of frying Monitor portion sizes  Eat smaller, more frequent meals throughout the day instead of large meals  An important thing to remember is, if you love foods that are not great for your health, you don't have to give them up completely. Instead, allow these foods to be a reward when you have done well. Allowing yourself to still have special treats every once in  a while is a nice way to tell yourself thank you for working hard to keep yourself healthy.   Also remember that every day is a new day. If you have a bad day and "fall off the wagon", you can still climb right back up and keep moving along on your journey!  We have resources available to help you!  Some websites that may be helpful include: www.http://carter.biz/  Www.VeryWellFit.com _____________________________________________________________  Activity Recommendations for All Patients  I recommend that all adults get at least 20 minutes of moderate physical activity that elevates your heart rate at least 5 days out of the week.  Some examples include: Walking or jogging at a pace that allows you to carry on a conversation Cycling (stationary bike or outdoors) Water aerobics Yoga Weight lifting Dancing If physical limitations prevent you from putting stress on your joints, exercise in a pool or seated in a chair are excellent options.  Do determine your MAXIMUM heart rate for activity: YOUR AGE - 220 = MAX  HeartRate   Remember! Do not push yourself too hard.  Start slowly and build up your pace, speed, weight, time in exercise, etc.  Allow your body to rest between exercise and get good sleep. You will need more water than normal when you are exerting yourself. Do not wait until you are thirsty to drink. Drink with a purpose of getting in at least 8, 8 ounce glasses of water a day plus more depending on how much you exercise and sweat.    If you begin to develop dizziness, chest pain, abdominal pain, jaw pain, shortness of breath, headache, vision changes, lightheadedness, or other concerning symptoms, stop the activity and allow your body to rest. If your symptoms are severe, seek emergency evaluation immediately. If your symptoms are concerning, but not severe, please let us know so that we can recommend further evaluation.

## 2021-07-15 LAB — COMPREHENSIVE METABOLIC PANEL
ALT: 24 IU/L (ref 0–32)
AST: 19 IU/L (ref 0–40)
Albumin/Globulin Ratio: 2 (ref 1.2–2.2)
Albumin: 4.7 g/dL (ref 3.8–4.8)
Alkaline Phosphatase: 125 IU/L — ABNORMAL HIGH (ref 44–121)
BUN/Creatinine Ratio: 7 — ABNORMAL LOW (ref 9–23)
BUN: 7 mg/dL (ref 6–20)
Bilirubin Total: 0.2 mg/dL (ref 0.0–1.2)
CO2: 24 mmol/L (ref 20–29)
Calcium: 9.7 mg/dL (ref 8.7–10.2)
Chloride: 103 mmol/L (ref 96–106)
Creatinine, Ser: 0.95 mg/dL (ref 0.57–1.00)
Globulin, Total: 2.4 g/dL (ref 1.5–4.5)
Glucose: 79 mg/dL (ref 70–99)
Potassium: 4.6 mmol/L (ref 3.5–5.2)
Sodium: 139 mmol/L (ref 134–144)
Total Protein: 7.1 g/dL (ref 6.0–8.5)
eGFR: 79 mL/min/{1.73_m2} (ref >59)

## 2021-07-15 LAB — CBC WITH DIFFERENTIAL/PLATELET
Basophils Absolute: 0.1 10*3/uL (ref 0.0–0.2)
Basos: 1 %
EOS (ABSOLUTE): 0.2 10*3/uL (ref 0.0–0.4)
Eos: 2 %
Hematocrit: 39.3 % (ref 34.0–46.6)
Hemoglobin: 13.5 g/dL (ref 11.1–15.9)
Immature Grans (Abs): 0 10*3/uL (ref 0.0–0.1)
Immature Granulocytes: 0 %
Lymphocytes Absolute: 5.1 10*3/uL — ABNORMAL HIGH (ref 0.7–3.1)
Lymphs: 52 %
MCH: 25.9 pg — ABNORMAL LOW (ref 26.6–33.0)
MCHC: 34.4 g/dL (ref 31.5–35.7)
MCV: 75 fL — ABNORMAL LOW (ref 79–97)
Monocytes Absolute: 0.6 10*3/uL (ref 0.1–0.9)
Monocytes: 6 %
Neutrophils Absolute: 3.8 10*3/uL (ref 1.4–7.0)
Neutrophils: 39 %
Platelets: 351 10*3/uL (ref 150–450)
RBC: 5.22 x10E6/uL (ref 3.77–5.28)
RDW: 13.7 % (ref 11.7–15.4)
WBC: 9.8 10*3/uL (ref 3.4–10.8)

## 2021-07-15 LAB — IRON,TIBC AND FERRITIN PANEL
Ferritin: 81 ng/mL (ref 15–150)
Iron Saturation: 24 % (ref 15–55)
Iron: 86 ug/dL (ref 27–159)
Total Iron Binding Capacity: 365 ug/dL (ref 250–450)
UIBC: 279 ug/dL (ref 131–425)

## 2021-07-15 LAB — VITAMIN D 25 HYDROXY (VIT D DEFICIENCY, FRACTURES): Vit D, 25-Hydroxy: 12.2 ng/mL — ABNORMAL LOW (ref 30.0–100.0)

## 2021-07-15 LAB — LIPID PANEL
Chol/HDL Ratio: 6.6 ratio — ABNORMAL HIGH (ref 0.0–4.4)
Cholesterol, Total: 237 mg/dL — ABNORMAL HIGH (ref 100–199)
HDL: 36 mg/dL — ABNORMAL LOW (ref >39)
LDL Chol Calc (NIH): 171 mg/dL — ABNORMAL HIGH (ref 0–99)
Triglycerides: 163 mg/dL — ABNORMAL HIGH (ref 0–149)
VLDL Cholesterol Cal: 30 mg/dL (ref 5–40)

## 2021-07-15 LAB — T4: T4, Total: 9 ug/dL (ref 4.5–12.0)

## 2021-07-15 LAB — HEMOGLOBIN A1C
Est. average glucose Bld gHb Est-mCnc: 123 mg/dL
Hgb A1c MFr Bld: 5.9 % — ABNORMAL HIGH (ref 4.8–5.6)

## 2021-07-15 LAB — TSH: TSH: 1.11 u[IU]/mL (ref 0.450–4.500)

## 2021-07-15 LAB — T3: T3, Total: 93 ng/dL (ref 71–180)

## 2021-07-18 ENCOUNTER — Ambulatory Visit (INDEPENDENT_AMBULATORY_CARE_PROVIDER_SITE_OTHER): Payer: 59 | Admitting: Rehabilitative and Restorative Service Providers"

## 2021-07-18 ENCOUNTER — Other Ambulatory Visit: Payer: Self-pay

## 2021-07-18 DIAGNOSIS — M25542 Pain in joints of left hand: Secondary | ICD-10-CM

## 2021-07-18 DIAGNOSIS — M6281 Muscle weakness (generalized): Secondary | ICD-10-CM
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
## 2021-07-19 ENCOUNTER — Ambulatory Visit (INDEPENDENT_AMBULATORY_CARE_PROVIDER_SITE_OTHER): Payer: 59 | Admitting: Nurse Practitioner

## 2021-07-19 ENCOUNTER — Other Ambulatory Visit: Payer: Self-pay

## 2021-07-19 ENCOUNTER — Encounter: Payer: Self-pay | Admitting: Nurse Practitioner

## 2021-07-19 ENCOUNTER — Other Ambulatory Visit (HOSPITAL_BASED_OUTPATIENT_CLINIC_OR_DEPARTMENT_OTHER): Payer: Self-pay | Admitting: Nurse Practitioner

## 2021-07-19 VITALS — BP 128/85 | HR 86 | Temp 97.9°F | Ht 62.0 in | Wt 207.0 lb

## 2021-07-19 DIAGNOSIS — N979 Female infertility, unspecified: Secondary | ICD-10-CM

## 2021-07-19 DIAGNOSIS — E559 Vitamin D deficiency, unspecified: Secondary | ICD-10-CM

## 2021-07-19 DIAGNOSIS — D573 Sickle-cell trait: Secondary | ICD-10-CM

## 2021-07-19 MED ORDER — VITAMIN D (ERGOCALCIFEROL) 1.25 MG (50000 UNIT) PO CAPS: 50000.0000 [IU] | ORAL_CAPSULE | ORAL | 0 refills | Status: DC
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
## 2021-07-19 NOTE — Patient Instructions (Signed)
You were seen today in the [facility] for sickle cell trait screening. Labs were collected, results will be available via MyChart or, if abnormal, you will be contacted by clinic staff.  Please follow up with your PCP.

## 2021-07-21 ENCOUNTER — Ambulatory Visit (INDEPENDENT_AMBULATORY_CARE_PROVIDER_SITE_OTHER): Payer: 59 | Admitting: Nurse Practitioner

## 2021-07-21 ENCOUNTER — Other Ambulatory Visit: Payer: Self-pay

## 2021-07-21 VITALS — BP 117/72 | HR 86 | Ht 62.0 in | Wt 205.6 lb

## 2021-07-21 DIAGNOSIS — R7303 Prediabetes: Secondary | ICD-10-CM

## 2021-07-21 DIAGNOSIS — E782 Mixed hyperlipidemia: Secondary | ICD-10-CM

## 2021-07-21 DIAGNOSIS — Z6836 Body mass index (BMI) 36.0-36.9, adult: Secondary | ICD-10-CM

## 2021-07-21 DIAGNOSIS — E559 Vitamin D deficiency, unspecified: Secondary | ICD-10-CM

## 2021-07-21 NOTE — Progress Notes (Signed)
? ?Established Patient Office Visit ? ?Subjective:  ?Patient ID: [name] Curry, female    DOB: 1980-10-07  Age: 41 y.o. MRN: [id number] ? ?CC:  ?Chief Complaint  ?Patient presents with  ? Follow-up  ? ? ?HPI ?[name] Curry presents for evaluation for new diagnosis of pre-DM, HLD, Vitamin D deficiency.  ? ?PRE DIABETES ?Current Medications: None- New Dx ?Glucose Monitoring: No  ?Blood Pressure Monitoring:  not checking  ?Hypoglycemic episodes:no ?Polydipsia/polyuria: no ?Visual changes: no ?Chest pain: no ?Paresthesias: no ?Diabetic Diet: no ?Exercise: no ? ?HYPERLIPIDEMIA ?Hyperlipidemia status:  New diagnosis- not controlled ?Supplements: none ?Aspirin:  no ?The ASCVD Risk score (Arnett DK, et al., 2019) failed to calculate for the following reasons: ?  The 2019 ASCVD risk score is only valid for ages 40 to 18 ?Chest pain:  no ?Coronary artery disease:  unknown ?Family history CAD:  yes ?Family history [place] CAD:  unknown ? ?[name] Curry and her husband have many questions today about the new diagnosis and steps to take to prevent progression. She tells me she has a family history significant for diabetes. She has not been diagnosed with elevated BG, HLD, or HTN in the past. She is open to diet and medication options that may be helpful.  ?[name] Curry does not follow any dietary restrictions, but endorses missing breakfast and lunch and eating a large dinner. She endorses severe cravings for sweets at night after meals with lots of late night snacking. She also endorses increased thirst and fatigue.  ?She does not get routine physical activity and often spends most of her time indoors. She and her husband tell me that she will occasionally begin an exercise routine but go at it very hard in the beginning and then burn out.  ? ?Last A1c: Current A1c:  ?Lab Results  ?Component Value Date  ? HGBA1C 5.9 (H) 07/14/2021  ? ?Outpatient Medications Prior to Visit  ?Medication Sig Dispense Refill  ? hydrOXYzine (ATARAX) 50 MG tablet  Take 1 tablet (50 mg total) by mouth at bedtime and may repeat dose one time if needed. 60 tablet 6  ? levothyroxine (SYNTHROID) 88 MCG tablet Take 1 tablet (88 mcg total) by mouth daily. 90 tablet 0  ? Vitamin D, Ergocalciferol, (DRISDOL) 1.25 MG (50000 UNIT) CAPS capsule Take 1 capsule (50,000 Units total) by mouth every 7 (seven) days. Take for 12 total doses(weeks) than can transition to 1000 units OTC supplement daily 12 capsule 0  ? ?No facility-administered medications prior to visit.  ? ? ?Allergies  ?Allergen Reactions  ? Atenolol Other (See Comments)  ?  Muscle cramps  ? Methimazole Hives  ? Penicillins Hives and Nausea And Vomiting  ?   ?Has patient had a PCN reaction causing immediate rash, facial/tongue/throat swelling, SOB or lightheadedness with hypotension: #  #  #  YES  #  #  #  ?Has patient had a PCN reaction causing severe rash involving mucus membranes or skin necrosis: No ?Has patient had a PCN reaction that required hospitalization No ?Has patient had a PCN reaction occurring within the last 10 years: No ?If all of the above answers are "NO", then may proceed with Cephalosporin use. ?  ? Sulfa Antibiotics Hives and Nausea And Vomiting  ? ? ?ROS ?Review of Systems ?All review of systems negative except what is listed in the HPI ? ?  ?Objective:  ?  ?Physical Exam ?Vitals and nursing note reviewed.  ?Constitutional:   ?   Appearance: Normal appearance. She is  obese.  ?HENT:  ?   Head: Normocephalic.  ?Eyes:  ?   Extraocular Movements: Extraocular movements intact.  ?   Conjunctiva/sclera: Conjunctivae normal.  ?   Pupils: Pupils are equal, round, and reactive to light.  ?Neck:  ?   Vascular: No carotid bruit.  ?Cardiovascular:  ?   Rate and Rhythm: Normal rate and regular rhythm.  ?   Pulses: Normal pulses.  ?   Heart sounds: Normal heart sounds.  ?Pulmonary:  ?   Effort: Pulmonary effort is normal.  ?   Breath sounds: Normal breath sounds.  ?Abdominal:  ?   General: Bowel sounds are normal.  ?    Palpations: Abdomen is soft.  ?Musculoskeletal:  ?   Cervical back: Normal range of motion.  ?   Right lower leg: No edema.  ?   Left lower leg: No edema.  ?Skin: ?   General: Skin is warm and dry.  ?   Capillary Refill: Capillary refill takes less than 2 seconds.  ?Neurological:  ?   General: No focal deficit present.  ?   Mental Status: She is alert and oriented to person, place, and time.  ?Psychiatric:     ?   Mood and Affect: Mood normal.     ?   Behavior: Behavior normal.     ?   Thought Content: Thought content normal.     ?   Judgment: Judgment normal.  ? ? ?LMP 07/07/2021  ?Wt Readings from Last 3 Encounters:  ?07/19/21 207 lb (93.9 kg)  ?07/14/21 208 lb (94.3 kg)  ?07/05/21 210 lb (95.3 kg)  ? ?  ?Assessment & Plan:  ? ?Problem List Items Addressed This Visit   ? ? Pre-diabetes - Primary  ? Relevant Medications  ? Semaglutide,0.25 or 0.5MG /DOS, (OZEMPIC, 0.25 OR 0.5 MG/DOSE,) 2 MG/1.5ML SOPN  ? Semaglutide,0.25 or 0.5MG /DOS, (OZEMPIC, 0.25 OR 0.5 MG/DOSE,) 2 MG/1.5ML SOPN  ? Semaglutide, 1 MG/DOSE, 4 MG/3ML SOPN  ? Vitamin D deficiency  ? Mixed hyperlipidemia  ? Relevant Medications  ? Semaglutide,0.25 or 0.5MG /DOS, (OZEMPIC, 0.25 OR 0.5 MG/DOSE,) 2 MG/1.5ML SOPN  ? Semaglutide,0.25 or 0.5MG /DOS, (OZEMPIC, 0.25 OR 0.5 MG/DOSE,) 2 MG/1.5ML SOPN  ? Semaglutide, 1 MG/DOSE, 4 MG/3ML SOPN  ? BMI 36.0-36.9,adult  ? Relevant Medications  ? Semaglutide,0.25 or 0.5MG /DOS, (OZEMPIC, 0.25 OR 0.5 MG/DOSE,) 2 MG/1.5ML SOPN  ? Semaglutide,0.25 or 0.5MG /DOS, (OZEMPIC, 0.25 OR 0.5 MG/DOSE,) 2 MG/1.5ML SOPN  ? Semaglutide, 1 MG/DOSE, 4 MG/3ML SOPN  ?- Extensive amount of time spent today on patient education about pre-diabetes, diabetes, and the course of these conditions.  ?-Dietary alterations to include ?- Three meals a day ?- No more than 180 grams of carbohydrates a day divided among meals and snacks ?- No more than 13g of saturated fat per day divided among meals and snacks.  ?- Increase fiber to at least 30g a day  with slow increase to avoid GI disturbance ?- Drink at least 64 ounces of water a day ? - Avoid sweetened drinks and diet drinks ?- Subsititute table sugar with lower glycemic sweeteners such as honey or Agave Nectar ?- Activity to include ? - Start with 10-15 minute walk daily ? - Walk at pace that increases heart rate, but that you can carry a conversation with another person.  ? - Increase time by 5 minutes about every 7 days ? - Goal exercise at least 30 minutes a day at moderate intensity ?- Medication ? - Semaglutide once weekly by  injection ? - Education provided on administration, side effects, and warnings.  ? - Avoid high carbohydrate and high fat meals ? - Decrease amount of food eaten with each meal and stop eating when full ?- Monitoring ? - No BG monitoring at this time ?- Continue Vitamin D3 supplementation weekly ?-F/U in 3 months or sooner if needed for repeat labs and evaluation.  ?  ? ?Meds ordered this encounter  ?Medications  ? Semaglutide,0.25 or 0.5MG /DOS, (OZEMPIC, 0.25 OR 0.5 MG/DOSE,) 2 MG/1.5ML SOPN  ?  Sig: Inject 0.25 mg into the skin once a week for 28 days, THEN 0.5 mg once a week for 14 days. PEN 1.  ?  Dispense:  1.5 mL  ?  Refill:  0  ? Semaglutide,0.25 or 0.5MG /DOS, (OZEMPIC, 0.25 OR 0.5 MG/DOSE,) 2 MG/1.5ML SOPN  ?  Sig: Inject 0.5 mg into the skin once a week. PEN 2- Start after completing PEN 1  ?  Dispense:  1.5 mL  ?  Refill:  0  ? Semaglutide, 1 MG/DOSE, 4 MG/3ML SOPN  ?  Sig: Inject 1 mg as directed once a week. PEN 3, Start after completing PEN 2  ?  Dispense:  3 mL  ?  Refill:  3  ? ? ?Follow-up: Return in about 3 months (around 10/21/2021) for pre-dm, HLD.  ? ? ?Tollie Eth, NP ?

## 2021-07-21 NOTE — Patient Instructions (Addendum)
Recommendations: ? ?Carbohydrates - Keep to less than 180 grams a day divided through the day ?Saturated fat- Keep to less than 13 grams a day divided through the day ?Fiber- Try to increase to 30 grams a day divided through the day ? ?I want you to start walking 10 minutes a day and work to increase your time and distance by 5 minutes every 7 days.  ? ? ?Honey or Agave nectar for sweetener  ? ?We will have you come back in about 3 months and see how you are doing. If you have any concerns or questions, let me know in the meantime.  ? ?

## 2021-07-22 ENCOUNTER — Encounter: Payer: Self-pay | Admitting: Dermatology

## 2021-07-22 ENCOUNTER — Encounter (HOSPITAL_BASED_OUTPATIENT_CLINIC_OR_DEPARTMENT_OTHER): Payer: Self-pay | Admitting: Nurse Practitioner

## 2021-07-22 DIAGNOSIS — E559 Vitamin D deficiency, unspecified: Secondary | ICD-10-CM | POA: Insufficient documentation

## 2021-07-22 DIAGNOSIS — E782 Mixed hyperlipidemia: Secondary | ICD-10-CM | POA: Insufficient documentation

## 2021-07-22 DIAGNOSIS — R7303 Prediabetes: Secondary | ICD-10-CM | POA: Insufficient documentation

## 2021-07-22 MED ORDER — SEMAGLUTIDE (1 MG/DOSE) 4 MG/3ML ~~LOC~~ SOPN: 1.0000 mg | PEN_INJECTOR | SUBCUTANEOUS | 3 refills | Status: DC

## 2021-07-22 MED ORDER — OZEMPIC (0.25 OR 0.5 MG/DOSE) 2 MG/1.5ML ~~LOC~~ SOPN: PEN_INJECTOR | SUBCUTANEOUS | 0 refills | Status: AC

## 2021-07-22 MED ORDER — OZEMPIC (0.25 OR 0.5 MG/DOSE) 2 MG/1.5ML ~~LOC~~ SOPN: 0.5000 mg | PEN_INJECTOR | SUBCUTANEOUS | 0 refills | Status: DC

## 2021-07-22 NOTE — Progress Notes (Signed)
° °  New Patient   Subjective  [name] Curry is a 41 y.o. female who presents for the following: New Patient (Initial Visit) (Pt has  cyst in L axilla x 2-3 months. Pt states that she shaved a mole off ~20 yrs ago and the cyst comes and goes since then. Pt tx with peroxide and occasionally goes to the er if the pain gets intense.).  Chronic cysts left axilla Location:  Duration:  Quality:  Associated Signs/Symptoms: Modifying Factors:  Severity:  Timing: Context:    The following portions of the chart were reviewed this encounter and updated as appropriate:  Tobacco   Allergies   Meds   Problems   Med Hx   Surg Hx   Fam Hx       Objective  Well appearing patient in no apparent distress; mood and affect are within normal limits. Left Axilla Several cysts, smoldering inflammation but none without fluctuance for incision and drainage., some fibrosis and sinus tract formation; essentially limited to the left axilla.  We discussed the diagnosis of hidradenitis suppurativa including possibility of other areas becoming inflamed, but with limited involvement rather than long-term antibiotics or Humira she is certainly a candidate for surgical excision of the involved area on left axilla.  We will check on the availability for her to consult with Dr. Westley Foots in [place] or Dr. Tammi Sou in [place]. Dr. Ulysees Barns [facility] Dr. Ramond Dial - [facility]     A focused examination was performed including axillae, inframammary.. Relevant physical exam findings are noted in the Assessment and Plan.   Assessment & Plan  Hidradenitis suppurativa of left axilla Left Axilla

## 2021-07-26 ENCOUNTER — Ambulatory Visit (HOSPITAL_BASED_OUTPATIENT_CLINIC_OR_DEPARTMENT_OTHER): Payer: 59 | Admitting: Nurse Practitioner

## 2021-08-04 LAB — PCOS DIAGNOSTIC PROFILE
17-Alpha-Hydroxyprogesterone: 53 ng/dL
ANTI-MULLERIAN HORMONE (AMH): 1.69 ng/mL
DHEA-Sulfate, LCMS: 109 ug/dL
Estradiol, Serum, MS: 239 pg/mL
Follicle Stimulating Hormone: 7.1 m[IU]/mL
Free Testosterone, Serum: 5.6 pg/mL
Luteinizing Hormone (LH) ECL: 12 m[IU]/mL
Prolactin: 7.89 ng/mL
Sex Hormone Binding Globulin: 48 nmol/L
TSH: 1.1 uU/mL
Testosterone, Serum (Total): 43 ng/dL
Testosterone-% Free: 1.3 %

## 2021-08-04 LAB — HGB FRACTIONATION CASCADE
Hgb A2: 6.1 % — ABNORMAL HIGH (ref 1.8–3.2)
Hgb A: 91.3 % — ABNORMAL LOW (ref 96.4–98.8)
Hgb F: 2.6 % — ABNORMAL HIGH (ref 0.0–2.0)
Hgb S: 0 %

## 2021-08-08 ENCOUNTER — Telehealth: Payer: Self-pay | Admitting: Rehabilitative and Restorative Service Providers"

## 2021-08-08 NOTE — Telephone Encounter (Signed)
Called Ms. Stegall about not being seen for a month and she states needing to return, adjust orthotic, etc. Got her scheduled for Wed at 68.  ?

## 2021-08-10 ENCOUNTER — Other Ambulatory Visit: Payer: Self-pay

## 2021-08-10 ENCOUNTER — Ambulatory Visit (INDEPENDENT_AMBULATORY_CARE_PROVIDER_SITE_OTHER): Payer: 59 | Admitting: Rehabilitative and Restorative Service Providers"

## 2021-08-10 DIAGNOSIS — M25542 Pain in joints of left hand: Secondary | ICD-10-CM

## 2021-08-10 DIAGNOSIS — M6281 Muscle weakness (generalized): Secondary | ICD-10-CM

## 2021-08-10 NOTE — Therapy (Addendum)
OUTPATIENT OCCUPATIONAL THERAPY TREATMENT & DISCHARGE NOTE   Patient Name: [name] Curry MRN: [id number] DOB:[birth year], 41 y.o., female Today's Date: 08/10/2021  PCP: Orma Render, NP REFERRING PROVIDER: Sherilyn Cooter, MD   OT End of Session - 08/10/21 1158     Visit Number 3    Number of Visits 12    Date for OT Re-Evaluation 09/01/21    Authorization Type [employer]    OT Start Time 1115    OT Stop Time 1158    OT Time Calculation (min) 43 min    Activity Tolerance Patient tolerated treatment well;No increased pain;Patient limited by pain    Behavior During Therapy [facility] for tasks assessed/performed              Past Medical History:  Diagnosis Date   Anemia    Anxiety    Asthma    Bipolar disorder ([place])    Depression    Graves disease    Graves disease    Hypothyroidism 06/30/2016   Sickle cell anemia (HCC)    Sickle cell trait ([place])    Suicidal behavior 08/05/2011   Broke a light bulb and tried to cut her wrists    Thyroid disease    was hyperthyroid, had radiaton in April 2015, now hyperthyroid   Past Surgical History:  Procedure Laterality Date   COLPOSCOPY     EXCISIONAL [place] N/A 06/30/2016   Procedure: TOTAL THYROIDECTOMY;  Surgeon: Armandina Gemma, MD;  Location: [place];  Service: General;  Laterality: N/A;   TOTAL THYROIDECTOMY  06/30/2016   Patient Active Problem List   Diagnosis Date Noted   Pre-diabetes 07/22/2021   Vitamin D deficiency 07/22/2021   Mixed hyperlipidemia 07/22/2021   Sickle-cell trait ([place]) 07/14/2021   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 07/14/2021   Insomnia 07/14/2021   BMI 36.0-36.9,adult 07/14/2021   Pure hypercholesterolemia 10/23/2019   Family history of breast cancer in sister 10/22/2019   Polymenorrhea 10/22/2019   Stress incontinence 10/21/2019   H/O Graves' disease 09/05/2016   Mass of breast 04/21/2014   Routine general medical examination at a health care facility 04/21/2014   Tobacco  dependence syndrome 04/21/2014   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   PERTINENT HISTORY: Per MD order: "Left thumb UCL injury.  Conservative management for now."  No hx of trauma. Thumb orthotic ordered. She has PmHx of anxiety, bipolar, Grave's disease, thyroidectomy, is a smoker.   PRECAUTIONS: Now ~2 months since initial injury and ~5 weeks immobilized in orthotic.    SUBJECTIVE:  3 weeks since last seen, has been wearing orthotic consistently, has had no significant pains.  States that orthotic needs adjustment as the volar pulp of her finger is hitting and becoming slightly sore.  PAIN:  Are you having pain? 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    FUNCTIONAL OUTCOME MEASURES: Quick Dash 29.5% impairment 07/18/21     SENSATION: Light touch: Appears intact Stereognosis: Appears intact Hot/Cold: Appears intact Proprioception: Appears intact   COORDINATION:   9 Hole Peg test: Right: TBD sec; Left: TBD sec Box and Blocks: Comments: TBD as needed FMS testing    EDEMA: minimal: 6.5cm Lt thumb base vs 6.4cm Rt thumb base, circumferentially    MUSCLE TONE: WNL   SKIN  INTEGRITY: no breakdown   PALPATION: TTP around Lt thumb MCP J, no provacative testing done due to injury    UE AROM/PROM:   A/PROM Left 07/07/2021  Wrist flexion 59  Wrist extension 70  Wrist pronation 85  Wrist supination 85  (Blank rows = not tested)   HAND A/PROM:   A/PROM Left 07/07/2021  Thumb MCP (0-60) 0-54  Thumb IP (0-80) (+20) - 55  Thumb Palmar abd/add  26-45  Thumb opposition to index WNL  (Blank rows = not tested)   UE MMT: NT in hand due to injury, WNL proximally     HAND FUNCTION:   Grip strength: Right: 68.5 lbs; Left: TBD lbs Lateral pinch: Right: 18 lbs, Left: TBD lbs   Comments: taken in Rt hand for goals    TODAY'S TREATMENT:  08/10/21: Due to her thumb rubbing orthotic, it is modified, she is given new strapping as well and a new orthotic is also made as her old is now dirty and becoming difficult to keep clean and sanitary. She will use the new orthotic and keep the old as a "backup" (she can even wash in cold water now, let dry).  OT also educates on new light stretches at lt thumb CMC joint in flexion and ext. OT does first, manually, with no pt issues, then she attempts. She is able to isolate T1 and feel no pain or discomfort in MCP J of thumb. She is to continue wrist stretches as tolerated. She states understanding.   07/18/21: Pt edu for compensation with home tasks, discussion through use of Quick DASH as well. For cooking, she is advised to wear glove over orthotic or hold down raw meat with fork or burger press, etc., when cutting with right hand. Also edu to modify back brush for no lt hand/thumb use.  OT also edu on exercises below to keep wrist and hand loose and also try to limit nerve tension, increase gliding due to continued elbow irritation and quick to feel numbness in ulnar nerve distribution. She demo's back well, no pain.  OT also makes small adjustments to orthotic today, after which it fits amazingly well.   Exercises Seated Wrist  Flexion Stretch - 3-4 x daily - 3-5 reps - 15 hold Seated Wrist Extension Stretch - 3-4 x daily - 3-5 reps - 15 hold Ulnar Nerve Flossing - 4-6 x daily - 1 sets - 10-15 reps    PATIENT EDUCATION: Education details: see above tx section for details  Person educated: Patient Education method: Explanation, Demonstration, and Handouts Education comprehension: verbalized understanding, returned demonstration, and needs further education     HOME EXERCISE PROGRAM: Access Code: [email or web address] URL: [email or web address] Prepared by: Benito Mccreedy   ASSESSMENT:   CLINICAL IMPRESSION: 08/10/21: Pt healing and ~5 weeks immobilized at this point. We will try gentle, mid-rage AROM next week at 6 weeks post immobilization and check for tolerance  07/18/21: Pt will be managed carefully due to  partial tears in thumb ligament, will withhold A/ROM 2 more weeks in thumb. She has been moving thumb, and is advised to comply with no motion for 2 weeks (at MP or [facility]).     GOALS: Goals reviewed with patient? No   SHORT TERM GOALS: (STG required if POC>30 days)   STG Name Target Date Goal status  1 Obtain custom orthotic to support injured thumb during daily routines 07/07/21 MET  2 Pending MRI/MD notes, she will improve Lt thumb flex A/ROM by at least 20* total for better mobility.   08/05/21 NOT MET- POC adjusted to accommodate MRI finding of tear.   3 Pt will complete Quick DASH assessment next session to determine fnl needs, potential compensation and modifications.  07/15/21 TBD next session 08/10/21     LONG TERM GOALS:    LTG Name Target Date Goal status  1 Pt will have at least 40# pain free grip in Lt hand for home fnl activities  09/02/21 INITIAL  2 Pt will improve Lt wrist flex A/ROM from 59* to at least 70* for better mobility when grasping 09/02/21 INITIAL  3 Pt will have less than 15% impairment per Quick DASH fnl assessment (baseline value will be established next session)  09/02/21  INITIAL    PLAN: OT FREQUENCY: D/C now   OT DURATION: 8D/C now   PLANNED INTERVENTIONS: self care/ADL training, therapeutic exercise, therapeutic activity, neuromuscular re-education, manual therapy, passive range of motion, splinting, ultrasound, fluidotherapy, compression bandaging, moist heat, cryotherapy, contrast bath, patient/family education, and coping strategies training   PLAN FOR NEXT SESSION:  Try K-Tapping for support and gentle, mid-rage AROM next week at 6 weeks post immobilization and check for tolerance. [place] and other modalities still appropriate to help with healing, blood flow, etc. At week 8, PROM to MCP can be attempted, if tolerating well. She will have MD f/u in about 1 more month.    RECOMMENDED OTHER SERVICES: none other now    CONSULTED AND AGREED WITH PLAN OF CARE: Patient and family member/caregiver    Benito Mccreedy, OTR/L, CHT 08/10/2021, 12:30 PM    OCCUPATIONAL THERAPY DISCHARGE SUMMARY  Visits from Start of Care: 3  Current functional level related to goals / functional outcomes: Pt stopped showing up to outpatient OT, and was contacted, never called back or returned. Goals could not be addressed/updated. Please see notes for more info/details.   Benito Mccreedy, OTR/L, CHT 10/10/21

## 2021-08-11 ENCOUNTER — Encounter (HOSPITAL_BASED_OUTPATIENT_CLINIC_OR_DEPARTMENT_OTHER): Payer: Self-pay | Admitting: Nurse Practitioner

## 2021-08-12 ENCOUNTER — Other Ambulatory Visit: Payer: Self-pay | Admitting: Surgery

## 2021-08-19 ENCOUNTER — Encounter (HOSPITAL_BASED_OUTPATIENT_CLINIC_OR_DEPARTMENT_OTHER): Payer: Self-pay | Admitting: Cardiology

## 2021-08-22 ENCOUNTER — Encounter: Payer: 59 | Admitting: Rehabilitative and Restorative Service Providers"

## 2021-08-22 NOTE — Therapy (Incomplete)
?OUTPATIENT OCCUPATIONAL THERAPY TREATMENT NOTE ? ? ?Patient Name: [name] Curry ?MRN: [id number] ?DOB:September 17, 1980, 41 y.o., female ?Today's Date: 08/22/2021 ? ?PCP: Orma Render, NP ?REFERRING PROVIDER: Orma Render, NP ? ? ? ? ? ?Past Medical History:  ?Diagnosis Date  ? Anemia   ? Anxiety   ? Asthma   ? Bipolar disorder ([place])   ? Depression   ? Graves disease   ? Graves disease   ? Hypothyroidism 06/30/2016  ? Sickle cell anemia (HCC)   ? Sickle cell trait ([place])   ? Suicidal behavior 08/05/2011  ? Broke a light bulb and tried to cut her wrists   ? Thyroid disease   ? was hyperthyroid, had radiaton in April 2015, now hyperthyroid  ? ?Past Surgical History:  ?Procedure Laterality Date  ? COLPOSCOPY    ? EXCISIONAL HEMORRHOIDECTOMY  1996  ? THYROIDECTOMY N/A 06/30/2016  ? Procedure: TOTAL THYROIDECTOMY;  Surgeon: Armandina Gemma, MD;  Location: [place];  Service: General;  Laterality: N/A;  ? TOTAL THYROIDECTOMY  06/30/2016  ? ?Patient Active Problem List  ? Diagnosis Date Noted  ? Pre-diabetes 07/22/2021  ? Vitamin D deficiency 07/22/2021  ? Mixed hyperlipidemia 07/22/2021  ? Sickle-cell trait ([place]) 07/14/2021  ? 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 07/14/2021  ? Insomnia 07/14/2021  ? BMI 36.0-36.9,adult 07/14/2021  ? Pure hypercholesterolemia 10/23/2019  ? Family history of breast cancer in sister 10/22/2019  ? Polymenorrhea 10/22/2019  ? Stress incontinence 10/21/2019  ? H/O Graves' disease 09/05/2016  ? Mass of breast 04/21/2014  ? Routine general medical examination at a health care facility 04/21/2014  ? Tobacco dependence syndrome 04/21/2014  ? 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 ? ?ONSET DATE: ~1 month onset of pain, sudden onset after 1 night, idiopathic  ?  ?REFERRING DIAG: [phone] (ICD-10-CM) - Chronic pain of left thumb ? ?THERAPY DIAG:  ?No diagnosis found. ? ? ?PERTINENT HISTORY: Per MD order: "Left thumb UCL injury.  Conservative management for now."  No hx of trauma. Thumb orthotic ordered. She has PmHx of  anxiety, bipolar, Grave's disease, thyroidectomy, is a smoker.  ? ?PRECAUTIONS: Now ~2.5 months since initial injury and ~6 weeks immobilized in orthotic.  ? ? ?SUBJECTIVE:  ?*** ? ?3 weeks since last seen, has been wearing orthotic consistently, has had no significant pains.  States that orthotic needs adjustment as the volar pulp of her finger is hitting and becoming slightly sore. ? ?PAIN:  ?Are you having pain? *** ?NPRS scale: 0/10 no pain at rest today  ?Pain location: lt thumb MP J ulnar side  ?Pain orientation: Left  ?PAIN TYPE: aching ?Pain description: intermittent  ?Aggravating factors: trying to use thumb  ?Relieving factors: ice, rest, orthotic  ? ? ? ?OBJECTIVE: (All objective assessments below are from initial evaluation on: 07/07/21 unless otherwise specified.)  ? ? ?DIAGNOSTIC FINDINGS: 07/07/21 MRI: demonstrates an intermediate, chronic, partial thickness tear of the UCL from its distal insertion ? ?ADLs: ?Overall ADLs: Pt has pain/problems with lifting/carrying home objects, pinching and pulling sheets, folding laundry, opening containers, etc.  ?  ?FUNCTIONAL OUTCOME MEASURES: ?Quick Dash 29.5% impairment 07/18/21 ?  ?  ?SENSATION: ?Light touch: Appears intact ?Stereognosis: Appears intact ?Hot/Cold: Appears intact ?Proprioception: Appears intact ?  ?COORDINATION: ?  ?52 Hole Peg test: Right: TBD sec; Left: TBD sec ?Box and Blocks: ?Comments: TBD as needed FMS testing  ?  ?EDEMA: minimal: 6.5cm Lt thumb base vs 6.4cm Rt thumb base, circumferentially  ?  ?MUSCLE TONE: WNL ?  ?SKIN INTEGRITY:  no breakdown ?  ?PALPATION: TTP around Lt thumb MCP J, no provacative testing done due to injury  ?  ?UE AROM/PROM: ?  ?A/PROM Left ?07/07/2021  ?Wrist flexion 59  ?Wrist extension 70  ?Wrist pronation 85  ?Wrist supination 85  ?(Blank rows = not tested) ?  ?HAND A/PROM: ?  ?A/PROM Left ?07/07/2021  ?Thumb MCP (0-60) 0-54  ?Thumb IP (0-80) (+20) - 55  ?Thumb Palmar abd/add  26-45  ?Thumb opposition to index WNL   ?(Blank rows = not tested) ?  ?UE MMT: NT in hand due to injury, WNL proximally ?  ?  ?HAND FUNCTION: ?  ?Grip strength: Right: 68.5 lbs; Left: TBD lbs ?Lateral pinch: Right: 18 lbs, Left: TBD lbs ?  ?Comments: taken in Rt hand for goals  ?  ?TODAY'S TREATMENT:  ?08/22/21: *** ? ?08/10/21: Due to her thumb rubbing orthotic, it is modified, she is given new strapping as well and a new orthotic is also made as her old is now dirty and becoming difficult to keep clean and sanitary. She will use the new orthotic and keep the old as a "backup" (she can even wash in cold water now, let dry).  ?OT also educates on new light stretches at lt thumb CMC joint in flexion and ext. OT does first, manually, with no pt issues, then she attempts. She is able to isolate T1 and feel no pain or discomfort in MCP J of thumb. She is to continue wrist stretches as tolerated. She states understanding.  ? ?07/18/21: Pt edu for compensation with home tasks, discussion through use of Quick DASH as well. For cooking, she is advised to wear glove over orthotic or hold down raw meat with fork or burger press, etc., when cutting with right hand. Also edu to modify back brush for no lt hand/thumb use.  OT also edu on exercises below to keep wrist and hand loose and also try to limit nerve tension, increase gliding due to continued elbow irritation and quick to feel numbness in ulnar nerve distribution. She demo's back well, no pain.  OT also makes small adjustments to orthotic today, after which it fits amazingly well.  ? ?Exercises ?Seated Wrist Flexion Stretch - 3-4 x daily - 3-5 reps - 15 hold ?Seated Wrist Extension Stretch - 3-4 x daily - 3-5 reps - 15 hold ?Ulnar Nerve Flossing - 4-6 x daily - 1 sets - 10-15 reps ? ?  ?PATIENT EDUCATION: ?Education details: see above tx section for details  ?Person educated: Patient ?Education method: Explanation, Demonstration, and Handouts ?Education comprehension: verbalized understanding, returned  demonstration, and needs further education ?  ?  ?HOME EXERCISE PROGRAM: ?Access Code: [email or web address] ?URL: [email or web address] ?Prepared by: Benito Mccreedy ?  ?ASSESSMENT: ?  ?CLINICAL IMPRESSION: ?08/22/21: *** ? ?08/10/21: Pt healing and ~5 weeks immobilized at this point. We will try gentle, mid-rage AROM next week at 6 weeks post immobilization and check for tolerance ? ?07/18/21: Pt will be managed carefully due to partial tears in thumb ligament, will withhold A/ROM 2 more weeks in thumb. She has been moving thumb, and is advised to comply with no motion for 2 weeks (at MP or [facility]).  ? ?  ?GOALS: ?Goals reviewed with patient? No ?  ?SHORT TERM GOALS: (STG required if POC>30 days) ?  ?STG Name Target Date Goal status  ?1 Obtain custom orthotic to support injured thumb during daily routines 07/07/21 MET  ?2 Pending MRI/MD notes, she will improve Lt thumb flex A/ROM  by at least 20* total for better mobility.   08/05/21 NOT MET- POC adjusted to accommodate MRI finding of tear.   ?3 Pt will complete Quick DASH assessment next session to determine fnl needs, potential compensation and modifications.  07/15/21 TBD next session 08/10/21   ?  ?LONG TERM GOALS:  ?  ?LTG Name Target Date Goal status  ?1 Pt will have at least 40# pain free grip in Lt hand for home fnl activities  09/02/21 INITIAL  ?2 Pt will improve Lt wrist flex A/ROM from 59* to at least 70* for better mobility when grasping 09/02/21 INITIAL  ?3 Pt will have less than 15% impairment per Quick DASH fnl assessment (baseline value will be established next session)  09/02/21 INITIAL  ?  ?PLAN: ?OT FREQUENCY: 1-2x/week ?  ?OT DURATION: 8 weeks ?  ?PLANNED INTERVENTIONS: self care/ADL training, therapeutic exercise, therapeutic activity, neuromuscular re-education, manual therapy, passive range of motion, splinting, ultrasound, fluidotherapy, compression bandaging, moist heat, cryotherapy, contrast bath, patient/family education, and coping strategies  training ?  ?PLAN FOR NEXT SESSION:  ?*** ?Try K-Tapping for support and gentle, mid-rage AROM next week at 6 weeks post immobilization and check for tolerance. [place] and other modalities still appropriate to help with hea

## 2021-08-23 ENCOUNTER — Telehealth (HOSPITAL_BASED_OUTPATIENT_CLINIC_OR_DEPARTMENT_OTHER): Payer: Self-pay

## 2021-08-23 ENCOUNTER — Telehealth: Payer: Self-pay | Admitting: Rehabilitative and Restorative Service Providers"

## 2021-08-23 NOTE — Telephone Encounter (Signed)
OT called patient to discuss missed appointment. OT left message with pt about calling to reschedule as needed. She was reminded of the attendance policy and future missed appointments could lead to early discharge from therapy. ? ?

## 2021-08-23 NOTE — Telephone Encounter (Signed)
Prior [employer] appeal was faxed back to [employer] at [phone]. ?

## 2021-08-30 ENCOUNTER — Telehealth (HOSPITAL_BASED_OUTPATIENT_CLINIC_OR_DEPARTMENT_OTHER): Payer: Self-pay | Admitting: Nurse Practitioner

## 2021-08-30 NOTE — Telephone Encounter (Signed)
Received fax transmission from [facility] [facility] denying coverage for the Ozempic injector. Documents will be in provider's yellow dot tray. ?

## 2021-08-31 ENCOUNTER — Other Ambulatory Visit: Payer: Self-pay | Admitting: Internal Medicine

## 2021-08-31 DIAGNOSIS — E890 Postprocedural hypothyroidism: Secondary | ICD-10-CM

## 2021-08-31 MED ORDER — LEVOTHYROXINE SODIUM 88 MCG PO TABS: 88.0000 ug | ORAL_TABLET | Freq: Every day | ORAL | 0 refills | Status: DC

## 2021-09-02 ENCOUNTER — Other Ambulatory Visit: Payer: Self-pay | Admitting: Internal Medicine

## 2021-09-02 DIAGNOSIS — E890 Postprocedural hypothyroidism: Secondary | ICD-10-CM

## 2021-09-08 ENCOUNTER — Telehealth: Payer: Self-pay | Admitting: Cardiology

## 2021-09-08 ENCOUNTER — Ambulatory Visit (HOSPITAL_BASED_OUTPATIENT_CLINIC_OR_DEPARTMENT_OTHER): Admit: 2021-09-08 | Payer: 59 | Admitting: Surgery

## 2021-09-08 ENCOUNTER — Encounter (HOSPITAL_BASED_OUTPATIENT_CLINIC_OR_DEPARTMENT_OTHER): Payer: Self-pay

## 2021-09-08 ENCOUNTER — Encounter (HOSPITAL_BASED_OUTPATIENT_CLINIC_OR_DEPARTMENT_OTHER): Payer: Self-pay | Admitting: Nurse Practitioner

## 2021-09-08 SURGERY — EXCISION, HIDRADENITIS, AXILLA
Anesthesia: General | Laterality: Left

## 2021-09-08 NOTE — Telephone Encounter (Signed)
Discussed with Dr Cristal Deer who reviewed chart Ok to scheduled appointment tomorrow and obtain EKG ?Advised patient, agreeable to appointment tomorrow. Will go to ED if worsening of symptoms  ? ?

## 2021-09-08 NOTE — Telephone Encounter (Signed)
See telephone encounter.

## 2021-09-08 NOTE — Telephone Encounter (Signed)
Patient c/o Palpitations:  High priority if patient c/o lightheadedness, shortness of breath, or chest pain ? ?How long have you had palpitations/irregular HR/ Afib? Are you having the symptoms now? Yesterday; yes  ? ?Are you currently experiencing lightheadedness, SOB or CP? SOB ? ?Do you have a history of afib (atrial fibrillation) or irregular heart rhythm? No  ? ?Have you checked your BP or HR? (document readings if available): 136/84; 86 ? ?Are you experiencing any other symptoms? Headache on right side  ?

## 2021-09-08 NOTE — Telephone Encounter (Signed)
Spoke with patient regarding palpitations ?States they started last night around 5 and have been going on since ?"They take my breath away" ?Blood pressure 134/86 HR 70-74 O2 97 ?Checked while talking with her HR 83 O2 99 but says she can see the heart fluttering around on pulse Ox ?Started with right sided headache last night but worse this am "piercing" above right eye  ?Is taking Ozempic and lost about 30 lbs in 30 days, walks 3-5 miles a day  ?Will discuss with Dr Cristal Deer and call patient back  ?

## 2021-09-09 ENCOUNTER — Ambulatory Visit (HOSPITAL_BASED_OUTPATIENT_CLINIC_OR_DEPARTMENT_OTHER): Payer: 59 | Admitting: Cardiology

## 2021-09-13 ENCOUNTER — Other Ambulatory Visit (HOSPITAL_BASED_OUTPATIENT_CLINIC_OR_DEPARTMENT_OTHER): Payer: Self-pay | Admitting: Nurse Practitioner

## 2021-09-13 DIAGNOSIS — E782 Mixed hyperlipidemia: Secondary | ICD-10-CM

## 2021-09-13 DIAGNOSIS — Z6836 Body mass index (BMI) 36.0-36.9, adult: Secondary | ICD-10-CM

## 2021-09-13 DIAGNOSIS — R7303 Prediabetes: Secondary | ICD-10-CM

## 2021-09-15 ENCOUNTER — Encounter: Payer: Self-pay | Admitting: Internal Medicine

## 2021-09-15 ENCOUNTER — Telehealth (HOSPITAL_BASED_OUTPATIENT_CLINIC_OR_DEPARTMENT_OTHER): Payer: Self-pay

## 2021-09-15 NOTE — Telephone Encounter (Signed)
Received prior auth from cover my meds. Patients insurance doesn't cover Ozempic. Patient is aware.  ?

## 2021-09-19 ENCOUNTER — Ambulatory Visit: Payer: Medicaid Other | Admitting: Dermatology

## 2021-09-23 ENCOUNTER — Encounter (HOSPITAL_BASED_OUTPATIENT_CLINIC_OR_DEPARTMENT_OTHER): Payer: Self-pay | Admitting: Nurse Practitioner

## 2021-10-04 ENCOUNTER — Ambulatory Visit (INDEPENDENT_AMBULATORY_CARE_PROVIDER_SITE_OTHER): Payer: 59 | Admitting: Internal Medicine

## 2021-10-04 ENCOUNTER — Encounter: Payer: Self-pay | Admitting: Internal Medicine

## 2021-10-04 VITALS — BP 130/82 | HR 88 | Ht 62.0 in | Wt 180.6 lb

## 2021-10-04 DIAGNOSIS — Z8639 Personal history of other endocrine, nutritional and metabolic disease: Secondary | ICD-10-CM | POA: Diagnosis not present

## 2021-10-04 DIAGNOSIS — E890 Postprocedural hypothyroidism: Secondary | ICD-10-CM | POA: Diagnosis not present

## 2021-10-04 DIAGNOSIS — R7309 Other abnormal glucose: Secondary | ICD-10-CM

## 2021-10-04 LAB — T4, FREE: Free T4: 0.99 ng/dL (ref 0.60–1.60)

## 2021-10-04 LAB — TSH: TSH: 0.18 u[IU]/mL — ABNORMAL LOW (ref 0.35–5.50)

## 2021-10-04 LAB — HEMOGLOBIN A1C: Hgb A1c MFr Bld: 5.8 % (ref 4.6–6.5)

## 2021-10-04 MED ORDER — LEVOTHYROXINE SODIUM 75 MCG PO TABS: 75.0000 ug | ORAL_TABLET | Freq: Every day | ORAL | 3 refills | Status: DC

## 2021-10-04 NOTE — Patient Instructions (Signed)
Please continue Levothyroxine 88 mcg daily.  Take the thyroid hormone every day, with water, at least 30 minutes before breakfast, separated by at least 4 hours from: - acid reflux medications - calcium - iron - multivitamins  Please stop at the lab.  Please come back for a follow-up appointment in 1 year. 

## 2021-10-04 NOTE — Progress Notes (Signed)
Patient ID: [name] Curry, female   DOB: 1980-07-06, 41 y.o.   MRN: [id number] ? ?This visit occurred during the SARS-CoV-2 public health emergency.  Safety protocols were in place, including screening questions prior to the visit, additional usage of staff PPE, and extensive cleaning of exam room while observing appropriate contact time as indicated for disinfecting solutions.  ? ?HPI  ?[name] Curry is a 41 y.o.-year-old female, initially referred by Dr. 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 Last visit 1 year ago. ?At last visit, she got another insurance and I was out of network.   ? ?Interim history: ?She continues to try to get pregnant.  She sees a Media planner. 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 ?At this visit, she has no symptoms or complaints. ?She started Ozempic 2 mo ago (ran out after 5 weeks) and also works out (walking and [employer]) and improved diet.  She lost 25 pounds.  She mentions that she had a HbA1c that returned high and this is what triggered these changes: ?Lab Results  ?Component Value Date  ? HGBA1C 5.9 (H) 07/14/2021  ? ?Reviewed history: ?In 05/2013, she was seen in the ED for a peritonsillar abscess recently and was also found to have a goiter and thyrotoxicosis. Pt was started on MMI 10 mg bid and referred to me.  She developed hives on this. ? ? She was also on Atenolol 25 mg daily >> stopped because of muscle cramps. ? 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 ? ?She stopped MMI 2 weeks prior to RAI tx, on 08/22/2013. ? ?She became hypothyroid afterwards >> started Levothyroxine 75 mcg daily, but her dose was gradually decreased and then stopped last summer as she became thyrotoxic again. Unfortunately, she was lost for f/u afterwards as she lost her insurance. ? ?She returned for a follow-up in 04/2016 >> was  feeling poorly and TFTs were thyrotoxic >> I referred her to surgery and she had total thyroidectomy on 06/30/2016 with Dr. Harlow Asa.  She had some neck pain after surgery, now resolved. ? ?We started levothyroxine soon after surgery.  We initially started 125 mcg daily, but had to decrease the dose to 100 mcg daily and then to 88 mcg daily.  ? ?Pt is on levothyroxine 88 mcg daily (dose decreased 09/2019), taken: ?- in am ?- fasting ?- at least 30 min from b'fast ?- no Ca, Fe, PPIs ?- prenatal MVIs at night - stopped ?- stopped Biotin 5000 mcg  ? ?Latest TSH was normal: ?Lab Results  ?Component Value Date  ? TSH 1.110 07/14/2021  ? TSH 1.852 04/20/2021  ? TSH 0.77 09/28/2020  ? TSH 0.33 (L) 09/29/2019  ? TSH 0.32 (L) 02/27/2018  ? TSH 1.12 07/31/2017  ? TSH 0.01 (L) 11/28/2016  ? TSH 0.02 (L) 09/06/2016  ? TSH 0.01 (L) 05/01/2016  ? TSH 0.03 (L) 10/27/2014  ? FREET4 1.00 04/20/2021  ? FREET4 0.99 09/28/2020  ? FREET4 0.96 09/29/2019  ? FREET4 1.13 02/27/2018  ? FREET4 0.84 07/31/2017  ? FREET4 1.22 11/28/2016  ? FREET4 1.33 09/06/2016  ? FREET4 1.34 05/01/2016  ? FREET4 1.73 (H) 10/27/2014  ? FREET4 1.96 (H) 09/11/2014  ? 01/13/2020: TSH 0.615 ? ?Her TSI antibodies were initially elevated but they became undetectably low: ?Lab Results  ?Component Value Date  ? TSI <89 02/27/2018  ? TSI >700 (H)  05/01/2016  ? ?Pt denies: ?- feeling nodules in neck ?- hoarseness ?- dysphagia ?- choking ?- SOB with lying down ? ?No FH of thyroid cancer. No h/o radiation tx to head or neck. ?No herbal supplements. No Biotin use. No recent steroids use.  ? ?ROS: ?+ see HPI ? ?I reviewed pt's medications, allergies, PMH, social hx, family hx, and changes were documented in the history of present illness. Otherwise, unchanged from my initial visit note. ? ?Past Medical History:  ?Diagnosis Date  ? Anemia   ? Anxiety   ? Asthma   ? Bipolar disorder ([place])   ? Depression   ? Graves disease   ? Graves disease   ? Hypothyroidism 06/30/2016  ?  Sickle cell anemia (HCC)   ? Sickle cell trait ([place])   ? Suicidal behavior 08/05/2011  ? Broke a light bulb and tried to cut her wrists   ? Thyroid disease   ? was hyperthyroid, had radiaton in April 2015, now hyperthyroid  ? ?Past Surgical History:  ?Procedure Laterality Date  ? COLPOSCOPY    ? EXCISIONAL HEMORRHOIDECTOMY  1996  ? THYROIDECTOMY N/A 06/30/2016  ? Procedure: TOTAL THYROIDECTOMY;  Surgeon: Armandina Gemma, MD;  Location: [facility];  Service: General;  Laterality: N/A;  ? TOTAL THYROIDECTOMY  06/30/2016  ? ?Social History  ? ?Socioeconomic History  ? Marital status: Married  ?  Spouse name: Not on file  ? Number of children: Not on file  ? Years of education: Not on file  ? Highest education level: Not on file  ?Occupational History  ? Not on file  ?Tobacco Use  ? Smoking status: Every Day  ?  Packs/day: 0.20  ?  Years: 17.00  ?  Pack years: 3.40  ?  Types: Cigars, Cigarettes  ? Smokeless tobacco: Never  ?Substance and Sexual Activity  ? Alcohol use: Yes  ?  Comment: 06/30/2016 "a beer q couple weeks"  ? Drug use: Yes  ?  Types: Marijuana  ?  Comment: 06/30/2016 "once/week"  ? Sexual activity: Yes  ?  Birth control/protection: None  ?Other Topics Concern  ? Not on file  ?Social History Narrative  ? Not on file  ? ?Social Determinants of Health  ? ?Financial Resource Strain: Not on file  ?Food Insecurity: Not on file  ?Transportation Needs: Not on file  ?Physical Activity: Not on file  ?Stress: Not on file  ?Social Connections: Not on file  ?Intimate Partner Violence: Not on file  ? ?Current Outpatient Medications on File Prior to Visit  ?Medication Sig Dispense Refill  ? hydrOXYzine (ATARAX) 50 MG tablet Take 1 tablet (50 mg total) by mouth at bedtime and may repeat dose one time if needed. 60 tablet 6  ? levothyroxine (SYNTHROID) 88 MCG tablet Take 1 tablet (88 mcg total) by mouth daily. 90 tablet 0  ? OZEMPIC, 0.25 OR 0.5 MG/DOSE, 2 MG/1.5ML SOPN DIAL AND INJECT UNDER THE SKIN 0.25 MG WEEKLY FOR 4 WEEKS THEN DIAL  AND INJECT 0.5 MG WEEKLY THEREAFTER 1.5 mL 0  ? Semaglutide, 1 MG/DOSE, 4 MG/3ML SOPN Inject 1 mg as directed once a week. PEN 3, Start after completing PEN 2 3 mL 3  ? Vitamin D, Ergocalciferol, (DRISDOL) 1.25 MG (50000 UNIT) CAPS capsule Take 1 capsule (50,000 Units total) by mouth every 7 (seven) days. Take for 12 total doses(weeks) than can transition to 1000 units OTC supplement daily 12 capsule 0  ? ?No current facility-administered medications on file prior to visit.  ? ?  Allergies  ?Allergen Reactions  ? Atenolol Other (See Comments)  ?  Muscle cramps  ? Methimazole Hives  ? Penicillins Hives and Nausea And Vomiting  ?   ?Has patient had a PCN reaction causing immediate rash, facial/tongue/throat swelling, SOB or lightheadedness with hypotension: #  #  #  YES  #  #  #  ?Has patient had a PCN reaction causing severe rash involving mucus membranes or skin necrosis: No ?Has patient had a PCN reaction that required hospitalization No ?Has patient had a PCN reaction occurring within the last 10 years: No ?If all of the above answers are "NO", then may proceed with Cephalosporin use. ?  ? Sulfa Antibiotics Hives and Nausea And Vomiting  ? ?Family History  ?Problem Relation Age of Onset  ? Arthritis Mother   ? Hyperlipidemia Father   ? Diabetes Other   ? ? ?PE: ?BP 130/82 (BP Location: Right Arm, Patient Position: Sitting, Cuff Size: Normal)   Pulse 88   Ht 5\' 2"  (1.575 m)   Wt 180 lb 9.6 oz (81.9 kg)   SpO2 99%   BMI 33.03 kg/m?   ?Wt Readings from Last 3 Encounters:  ?10/04/21 180 lb 9.6 oz (81.9 kg)  ?07/21/21 205 lb 9.6 oz (93.3 kg)  ?07/19/21 207 lb (93.9 kg)  ? ?Constitutional: overweight, in NAD ?Eyes: PERRLA, EOMI, no exophthalmos ?ENT: moist mucous membranes, no neck masses palpated, no cervical lymphadenopathy ?Cardiovascular: RRR, No MRG ?Respiratory: CTA B ?Musculoskeletal: no deformities, strength intact in all 4 ?Skin: moist, warm, no rashes ?Neurological: no tremor with outstretched hands, DTR  normal in all 4 ? ?ASSESSMENT: ?1. Postablative hypothyroidism ?- after thyroidectomy for Graves' disease ? ?- CT neck (06/05/2013):  ?Decrease in size of right tonsil abscess, previously 11 x 17 mm, now 6 x 6 mm wi

## 2021-10-05 ENCOUNTER — Encounter: Payer: Self-pay | Admitting: Internal Medicine

## 2021-10-07 ENCOUNTER — Ambulatory Visit (INDEPENDENT_AMBULATORY_CARE_PROVIDER_SITE_OTHER): Payer: 59 | Admitting: Cardiology

## 2021-10-07 ENCOUNTER — Encounter (HOSPITAL_BASED_OUTPATIENT_CLINIC_OR_DEPARTMENT_OTHER): Payer: Self-pay | Admitting: Cardiology

## 2021-10-07 VITALS — BP 126/86 | HR 70 | Ht 62.5 in | Wt 179.1 lb

## 2021-10-07 DIAGNOSIS — I4729 Other ventricular tachycardia: Secondary | ICD-10-CM | POA: Diagnosis not present

## 2021-10-07 DIAGNOSIS — Z7189 Other specified counseling: Secondary | ICD-10-CM

## 2021-10-07 DIAGNOSIS — E669 Obesity, unspecified: Secondary | ICD-10-CM

## 2021-10-07 DIAGNOSIS — D563 Thalassemia minor: Secondary | ICD-10-CM | POA: Insufficient documentation

## 2021-10-07 DIAGNOSIS — R002 Palpitations: Secondary | ICD-10-CM | POA: Diagnosis not present

## 2021-10-07 DIAGNOSIS — Z716 Tobacco abuse counseling: Secondary | ICD-10-CM | POA: Diagnosis not present

## 2021-10-07 MED ORDER — METOPROLOL TARTRATE 25 MG PO TABS: 25.0000 mg | ORAL_TABLET | Freq: Four times a day (QID) | ORAL | 3 refills | Status: DC | PRN

## 2021-10-07 NOTE — Patient Instructions (Addendum)
Medication Instructions:  ?Your Physician recommend you continue on your current medication as directed.   ? ?*If you need a refill on your cardiac medications before your next appointment, please call your pharmacy* ? ? ?Lab Work: ?None ordered today ? ? ?Testing/Procedures: ?None ordered today ? ? ?Follow-Up: ?At [facility], you and your health needs are our priority.  As part of our continuing mission to provide you with exceptional heart care, we have created designated Provider Care Teams.  These Care Teams include your primary Cardiologist (physician) and Advanced Practice Providers (APPs -  Physician Assistants and Nurse Practitioners) who all work together to provide you with the care you need, when you need it. ? ?We recommend signing up for the patient portal called "MyChart".  Sign up information is provided on this After Visit Summary.  MyChart is used to connect with patients for Virtual Visits (Telemedicine).  Patients are able to view lab/test results, encounter notes, upcoming appointments, etc.  Non-urgent messages can be sent to your provider as well.   ?To learn more about what you can do with MyChart, go to [email or web address].   ? ?Your next appointment:   ?1 year(s) ? ?The format for your next appointment:   ?In Person ? ?Provider:   ?Bridgette Christopher, MD{ ? ? ?[employer]: ?Website: [email or web address] ? ?DR. CHRISTOPHER RECOMMENDS YOU PURCHASE  " Kardia" [employer]  [employer] FROM THE  [employer]  APP PLAY STORE.  ?THE APP IS FREE , BUT THE  EQUIPMENT HAS A COST. ?IT ALLOWS YOU TO OBTAIN A RECORDING OF YOUR HEART RATE AND RHYTHM BY PROVIDING A SHORT STRIP THAT YOU CAN SHARE WITH YOUR PROVIDER.  ? ? ? ?

## 2021-10-07 NOTE — Progress Notes (Signed)
Cardiology Office Note:    Date:  10/07/2021   ID:  [name] Curry, DOB 1980-12-04, MRN [id number]  PCP:  Tollie Eth, NP  Cardiologist:  Jodelle Red, MD  Referring MD: Tollie Eth, NP   CC: Follow-up  History of Present Illness:    [name] Curry is a 41 y.o. female with a hx of hypothyroidism, Graves disease, beta thal trait, anemia, asthma, bipolar disorder, and depression, who is seen for follow-up. She was initially seen 06/01/21 as a new consult at the request of Early, Sung Amabile, NP for the evaluation and management of ventricular tachycardia.  Cardiac history:  Palpitations started late 2022, worse with stress. Occurs randomly. Prior workup: 3.5 day Zio monitor which showed 1 episode of 6 beats of non-sustained ventricular tachycardia, and <1% early beats. Family history: Her sister had open-heart surgery when she was 41 yo. No known history of sudden cardiac death.  Today: She messaged the office 09/08/21 and reported recurrence of persistent heart flutters every 2 minutes since 9 PM the night prior. She noted associated shortness of breath and headaches. She was also taking Ozempic and lost about 30 lbs in 30 days. She was scheduled for an appointment today and advised to go to the ED for worsening symptoms.  She is feeling better than ever and her A1C is reportedly decreased on Ozempic. Her weight is down 30 lbs and she has been motivated to work on her diet. Unfortunately, Reginal Lutes has not been approved by her insurance.  As noted above, she confirms that her palpitations were "beating out of control" every 2 minutes, taking her breath away and disrupting her sleep. Sitting up in bed did not seem to help. Her palpitations lasted for about 48 hours, and then she began to feel better. She denies any significant lifestyle changes. Of note, these episodes seem to occur every 3-4 months, commonly for up to a few days at a time. Her initial episode had lasted up to a week.  Generally her palpitations cause her to feel anxious, although she notes she is improving somewhat in this regard.  As of yesterday she has started taking levothyroxine 88 mcg for 6 days out of the week instead of every day. This was reportedly changed because of her low TSH due to her weight loss.  She continues to work on quitting smoking. She has cut back tremendously to 3 cigarettes a day, which she attributes to her routine exercise. Lately she denies any physical limitations during exercise.  Previously while on atenolol she developed "extreme" muscle cramps.  She reports she was found to be negative for the sickle cell trait. It was found to be beta thalessemia. Due to this finding, she plans on becoming pregnant in the next 2 years.  She denies any chest pain, or peripheral edema. No lightheadedness, headaches, syncope, orthopnea, or PND.   Past Medical History:  Diagnosis Date   Anemia    Anxiety    Asthma    Bipolar disorder (HCC)    Depression    Graves disease    Graves disease    Hypothyroidism 06/30/2016   Sickle cell anemia (HCC)    Sickle cell trait (HCC)    Suicidal behavior 08/05/2011   Broke a light bulb and tried to cut her wrists    Thyroid disease    was hyperthyroid, had radiaton in April 2015, now hyperthyroid    Past Surgical History:  Procedure Laterality Date   COLPOSCOPY  EXCISIONAL HEMORRHOIDECTOMY  1996   THYROIDECTOMY N/A 06/30/2016   Procedure: TOTAL THYROIDECTOMY;  Surgeon: Darnell Level, MD;  Location: [facility];  Service: General;  Laterality: N/A;   TOTAL THYROIDECTOMY  06/30/2016    Current Medications: Current Outpatient Medications on File Prior to Visit  Medication Sig   hydrOXYzine (ATARAX) 50 MG tablet Take 1 tablet (50 mg total) by mouth at bedtime and may repeat dose one time if needed.   levothyroxine (SYNTHROID) 75 MCG tablet Take 1 tablet (75 mcg total) by mouth daily.   OZEMPIC, 0.25 OR 0.5 MG/DOSE, 2 MG/1.5ML SOPN DIAL AND INJECT  UNDER THE SKIN 0.25 MG WEEKLY FOR 4 WEEKS THEN DIAL AND INJECT 0.5 MG WEEKLY THEREAFTER   Semaglutide, 1 MG/DOSE, 4 MG/3ML SOPN Inject 1 mg as directed once a week. PEN 3, Start after completing PEN 2   Vitamin D, Ergocalciferol, (DRISDOL) 1.25 MG (50000 UNIT) CAPS capsule Take 1 capsule (50,000 Units total) by mouth every 7 (seven) days. Take for 12 total doses(weeks) than can transition to 1000 units OTC supplement daily   No current facility-administered medications on file prior to visit.     Allergies:   Atenolol, Methimazole, Penicillins, and Sulfa antibiotics   Social History   Tobacco Use   Smoking status: Every Day    Packs/day: 0.20    Years: 17.00    Pack years: 3.40    Types: Cigars, Cigarettes   Smokeless tobacco: Never  Substance Use Topics   Alcohol use: Yes    Comment: 06/30/2016 "a beer q couple weeks"   Drug use: Yes    Types: Marijuana    Comment: 06/30/2016 "once/week"    Family History: family history includes Arthritis in her mother; Diabetes in an other family member; Hyperlipidemia in her father.  ROS:   Please see the history of present illness. (+) Palpitations (+) Anxiety All other systems are reviewed and negative.   EKGs/Labs/Other Studies Reviewed:    The following studies were reviewed today:  Echo 06/06/2021: Sonographer Comments: Patient is morbidly obese. Image acquisition  challenging due to patient body habitus and Image acquisition challenging  due to respiratory motion.  IMPRESSIONS    1. Left ventricular ejection fraction, by estimation, is 60 to 65%. The  left ventricle has normal function. The left ventricle has no regional  wall motion abnormalities. Left ventricular diastolic parameters were  normal. The average left ventricular  global longitudinal strain is -19.3 %. The global longitudinal strain is  normal.   2. Right ventricular systolic function is normal. The right ventricular  size is normal.   3. The mitral valve is  grossly normal. Trivial mitral valve  regurgitation. No evidence of mitral stenosis.   4. The aortic valve is tricuspid. Aortic valve regurgitation is not  visualized. No aortic stenosis is present.   Conclusion(s)/Recommendation(s): Normal biventricular function without  evidence of hemodynamically significant valvular heart disease.   Zio Monitor 03/31/2021 ([facility]): 3 day zio monitor showed 1 6 beat run of NSVT at 12:19 AM on 04/02/21. Otherwise min HR 61 bpm, max HR 174 bpm, average HR 90 bpm. Rare PACS and PVCS (<1%). No SVT, afib, pauses, or high grade blocke. There were 10 patient triggered events. 9 were sinus, and 1 was the noted NSVT episode.  CTA Chest 07/22/2016: COMPARISON:  Chest radiograph performed 07/21/2016, and CT of the chest performed 06/03/2013   FINDINGS: Cardiovascular:  There is no evidence of pulmonary embolus.   The heart is normal  in size. The thoracic aorta is unremarkable in appearance. No calcific atherosclerotic disease is seen. The great vessels are grossly unremarkable.   Mediastinum/Nodes: No mediastinal lymphadenopathy is seen. No pericardial effusion is identified. The patient is status post thyroidectomy. The thyroid gland is grossly unremarkable in appearance. No axillary lymphadenopathy is seen.   Lungs/Pleura: The lungs are clear bilaterally. No focal consolidation, pleural effusion or pneumothorax is seen. No masses are identified.   Upper Abdomen: The visualized portions of the liver and spleen are grossly unremarkable. The visualized portions of the adrenal glands are within normal limits.   Musculoskeletal: No acute osseous abnormalities are identified. The visualized musculature is unremarkable in appearance.   Review of the MIP images confirms the above findings.   IMPRESSION: No evidence of pulmonary embolus.  Lungs clear bilaterally.  EKG:  EKG is personally reviewed.   10/07/2021:  NSR at 70 bpm, unchanged  nonspecific TWI 06/01/2021: NSR at 82 bpm, iRBBB, nonspecific inferolateral TWI  Recent Labs: 07/14/2021: ALT 24; BUN 7; Creatinine, Ser 0.95; Hemoglobin 13.5; Platelets 351; Potassium 4.6; Sodium 139 10/04/2021: TSH 0.18   Recent Lipid Panel    Component Value Date/Time   CHOL 237 (H) 07/14/2021 1424   TRIG 163 (H) 07/14/2021 1424   HDL 36 (L) 07/14/2021 1424   CHOLHDL 6.6 (H) 07/14/2021 1424   CHOLHDL 5 04/21/2014 1111   VLDL 20.4 04/21/2014 1111   LDLCALC 171 (H) 07/14/2021 1424    Physical Exam:    VS:  BP 126/86   Pulse 70   Ht 5' 2.5" (1.588 m)   Wt 179 lb 1.6 oz (81.2 kg)   BMI 32.24 kg/m     Wt Readings from Last 3 Encounters:  10/07/21 179 lb 1.6 oz (81.2 kg)  10/04/21 180 lb 9.6 oz (81.9 kg)  07/21/21 205 lb 9.6 oz (93.3 kg)    GEN: Well nourished, well developed in no acute distress HEENT: Normal, moist mucous membranes NECK: No JVD CARDIAC: regular rhythm, normal S1 and S2, no rubs or gallops. No murmur. VASCULAR: Radial and DP pulses 2+ bilaterally. No carotid bruits RESPIRATORY:  Clear to auscultation without rales, wheezing or rhonchi  ABDOMEN: Soft, non-tender, non-distended MUSCULOSKELETAL:  Ambulates independently SKIN: Warm and dry, no edema NEUROLOGIC:  Alert and oriented x 3. No focal neuro deficits noted. PSYCHIATRIC:  Normal affect    ASSESSMENT:    1. Heart palpitations   2. NSVT (nonsustained ventricular tachycardia) (HCC)   3. Obesity (BMI 30.0-34.9)   4. Tobacco abuse counseling   5. Cardiac risk counseling   6. Counseling on health promotion and disease prevention   7. Thalassemia trait, beta     PLAN:    Palpitations NSVT -echo, monitor as above -tolerating metoprolol  Tobacco abuse counseling: The patient was counseled on tobacco cessation today for 3 minutes.  Counseling included reviewing the risks of smoking tobacco products, how it impacts the patient's current medical diagnoses and different strategies for quitting.   Pharmacotherapy to aid in tobacco cessation was not prescribed today.   Obesity: BMI currently 32 -lost weight on Ozempic, but no longer covered -we discussed Wegovy and gave sample today. We discussed that it does not appear that it will be covered with her current insurance plans. She is debating paying out of pocket for it. Also discussed that if she does get pregnancy, would not continue it.  Beta thal: has trait, thought previously it was sickle trait. Plans to discuss pregnancy with her OB/gyn  Cardiac risk  counseling and prevention recommendations: -recommend heart healthy/Mediterranean diet, with whole grains, fruits, vegetable, fish, lean meats, nuts, and olive oil. Limit salt. -recommend moderate walking, 3-5 times/week for 30-50 minutes each session. Aim for at least 150 minutes.week. Goal should be pace of 3 miles/hours, or walking 1.5 miles in 30 minutes -recommend avoidance of tobacco products. Avoid excess alcohol. -ASCVD risk score: The ASCVD Risk score (Arnett DK, et al., 2019) failed to calculate for the following reasons:   The 2019 ASCVD risk score is only valid for ages 70 to 30    Plan for follow up:  1 year.  Jodelle Red, MD, PhD, [facility] [place]  [facility]    Medication Adjustments/Labs and Tests Ordered: Current medicines are reviewed at length with the patient today.  Concerns regarding medicines are outlined above.   Orders Placed This Encounter  Procedures   EKG 12-Lead   Meds ordered this encounter  Medications   metoprolol tartrate (LOPRESSOR) 25 MG tablet    Sig: Take 1 tablet (25 mg total) by mouth every 6 (six) hours as needed (palpitations).    Dispense:  90 tablet    Refill:  3   Patient Instructions  Medication Instructions:  Your Physician recommend you continue on your current medication as directed.    *If you need a refill on your cardiac medications before your next appointment, please call your pharmacy*   Lab  Work: None ordered today   Testing/Procedures: None ordered today   Follow-Up: At [facility], you and your health needs are our priority.  As part of our continuing mission to provide you with exceptional heart care, we have created designated Provider Care Teams.  These Care Teams include your primary Cardiologist (physician) and Advanced Practice Providers (APPs -  Physician Assistants and Nurse Practitioners) who all work together to provide you with the care you need, when you need it.  We recommend signing up for the patient portal called "MyChart".  Sign up information is provided on this After Visit Summary.  MyChart is used to connect with patients for Virtual Visits (Telemedicine).  Patients are able to view lab/test results, encounter notes, upcoming appointments, etc.  Non-urgent messages can be sent to your provider as well.   To learn more about what you can do with MyChart, go to [email or web address].    Your next appointment:   1 year(s)  The format for your next appointment:   In Person  Provider:   Jodelle Red, MD{   [employer]: Website: [email or web address]  DR. Cristal Deer RECOMMENDS YOU PURCHASE  " [employer]ia" [employer]  [employer] FROM THE  [employer]  APP PLAY STORE.  THE APP IS FREE , BUT THE  EQUIPMENT HAS A COST. IT ALLOWS YOU TO OBTAIN A RECORDING OF YOUR HEART RATE AND RHYTHM BY PROVIDING A SHORT STRIP THAT YOU CAN SHARE WITH YOUR PROVIDER.            I,Mathew Stumpf,acting as a Neurosurgeon for [employer], MD.,have documented all relevant documentation on the behalf of Jodelle Red, MD,as directed by  Jodelle Red, MD while in the presence of Jodelle Red, MD.  I, Jodelle Red, MD, have reviewed all documentation for this visit. The documentation on 10/07/21 for the exam, diagnosis, procedures, and orders are all accurate and complete.   Signed, Jodelle Red, MD  PhD 10/07/2021     [facility]

## 2021-10-11 ENCOUNTER — Other Ambulatory Visit (HOSPITAL_BASED_OUTPATIENT_CLINIC_OR_DEPARTMENT_OTHER): Payer: Self-pay | Admitting: Nurse Practitioner

## 2021-10-11 DIAGNOSIS — E559 Vitamin D deficiency, unspecified: Secondary | ICD-10-CM

## 2021-10-21 ENCOUNTER — Ambulatory Visit (INDEPENDENT_AMBULATORY_CARE_PROVIDER_SITE_OTHER): Payer: 59 | Admitting: Nurse Practitioner

## 2021-10-21 ENCOUNTER — Encounter (HOSPITAL_BASED_OUTPATIENT_CLINIC_OR_DEPARTMENT_OTHER): Payer: Self-pay | Admitting: Nurse Practitioner

## 2021-10-21 VITALS — BP 108/76 | HR 79 | Ht 62.5 in | Wt 176.8 lb

## 2021-10-21 DIAGNOSIS — Z6836 Body mass index (BMI) 36.0-36.9, adult: Secondary | ICD-10-CM

## 2021-10-21 DIAGNOSIS — R7303 Prediabetes: Secondary | ICD-10-CM

## 2021-10-21 DIAGNOSIS — E890 Postprocedural hypothyroidism: Secondary | ICD-10-CM | POA: Diagnosis not present

## 2021-10-21 DIAGNOSIS — E782 Mixed hyperlipidemia: Secondary | ICD-10-CM

## 2021-10-21 DIAGNOSIS — E559 Vitamin D deficiency, unspecified: Secondary | ICD-10-CM | POA: Diagnosis not present

## 2021-10-21 NOTE — Progress Notes (Signed)
[name] Keeler, DNP, AGNP-c [place], [place] [address] [phone] Office [phone] Fax  ESTABLISHED PATIENT- Chronic Health and/or Follow-Up Visit  Blood pressure 108/76, pulse 79, height 5' 2.5" (1.588 m), weight 176 lb 12.8 oz (80.2 kg), SpO2 100 %.  Follow-up   HPI  [name] Curry  is a 41 y.o. year old female presenting today for evaluation and management of the following: Pre-DM Recent labs with endocrinology Significant changes to her diet and activity level Reports that she feels great No increased hunger, thirst, urination Thyroid Well-controlled Following with endocrinology Recent labs completed No current symptoms She is continuing vitamin D replacement Weight Loss Continuing with semaglutide Insurance will not cover this therefore she has been dependent on samples Running 3-5 miles on the treadmill 3 days a week 5 days a week working on additional exercises such as weightlifting, kickboxing, stretching Eating very well and monitoring carbohydrate and fat intake closely She tells me that she feels better than she ever has.  ROS All ROS negative with exception of what is listed in HPI  PHYSICAL EXAM Physical Exam Vitals and nursing note reviewed.  Constitutional:      General: She is not in acute distress.    Appearance: Normal appearance.  HENT:     Head: Normocephalic.  Eyes:     Extraocular Movements: Extraocular movements intact.     Conjunctiva/sclera: Conjunctivae normal.     Pupils: Pupils are equal, round, and reactive to light.  Neck:     Vascular: No carotid bruit.  Cardiovascular:     Rate and Rhythm: Normal rate and regular rhythm.     Pulses: Normal pulses.     Heart sounds: Normal heart sounds. No murmur heard. Pulmonary:     Effort: Pulmonary effort is normal.     Breath sounds: Normal breath sounds. No wheezing.  Abdominal:     General: Bowel sounds are  normal. There is no distension.     Palpations: Abdomen is soft.     Tenderness: There is no abdominal tenderness. There is no guarding.  Musculoskeletal:        General: Normal range of motion.     Cervical back: Normal range of motion and neck supple.     Right lower leg: No edema.     Left lower leg: No edema.  Lymphadenopathy:     Cervical: No cervical adenopathy.  Skin:    General: Skin is warm and dry.     Capillary Refill: Capillary refill takes less than 2 seconds.  Neurological:     General: No focal deficit present.     Mental Status: She is alert and oriented to person, place, and time.  Psychiatric:        Mood and Affect: Mood normal.        Behavior: Behavior normal.        Thought Content: Thought content normal.        Judgment: Judgment normal.    ASSESSMENT & PLAN Problem List Items Addressed This Visit     BMI 36.0-36.9,adult    BMI 31.82 today.  Patient praised for her significant efforts and lifestyle changes that have led to weight loss and overall improved mental health.  She is really doing fantastic!  Encourage patient to continue with her efforts at this time.  We will continue semaglutide.  Her insurance does not cover this therefore we are dependent on samples.  I have provided her  with 4-week supply of samples today.  She will call the office to determine if we have additional samples available once she has completed this.       Pre-diabetes - Primary    Chronic.  Recent A1c within normal limits.  Patient is doing very well on semaglutide.  She has made significant changes to her diet and activity levels.  I am very proud of her for all of her efforts and the significant changes that I have seen in her over the past few months.  Encourage patient to continue with her current efforts.  Sample provided today for 4-week supply of semaglutide as patient's insurance does not cover this.  Given the significant benefit of the medication on the patient thus far I  do feel that continuation is appropriate.  We will plan for follow-up in 6 months or sooner if needed.       Vitamin D deficiency    Chronic.  Patient doing very well on supplementation.  Recently refilled by endocrinology.  We will continue to monitor.       Mixed hyperlipidemia    Chronic.  Recent labs stable.  She is making significant changes with weight loss and increased activity.  I do suspect improved control of her lipids directly related to these lifestyle changes.  I am very proud of her for all of her efforts and her success.  We will continue to monitor.       Other Visit Diagnoses     Postsurgical hypothyroidism            FOLLOW-UP Return in about 6 months (around 04/22/2022) for Pre-DM, HTN. Weight Loss.   [name] Keeler, DNP, AGNP-c 10/21/2021 10:56 AM

## 2021-10-21 NOTE — Assessment & Plan Note (Signed)
BMI 31.82 today.  Patient praised for her significant efforts and lifestyle changes that have led to weight loss and overall improved mental health.  She is really doing fantastic!  Encourage patient to continue with her efforts at this time.  We will continue semaglutide.  Her insurance does not cover this therefore we are dependent on samples.  I have provided her with 4-week supply of samples today.  She will call the office to determine if we have additional samples available once she has completed this.

## 2021-10-21 NOTE — Assessment & Plan Note (Signed)
Chronic.  Recent labs stable.  She is making significant changes with weight loss and increased activity.  I do suspect improved control of her lipids directly related to these lifestyle changes.  I am very proud of her for all of her efforts and her success.  We will continue to monitor.

## 2021-10-21 NOTE — Assessment & Plan Note (Signed)
Chronic.  Patient doing very well on supplementation.  Recently refilled by endocrinology.  We will continue to monitor.

## 2021-10-21 NOTE — Patient Instructions (Addendum)
It was a pleasure seeing you today. I hope your time spent with [place] was pleasant and helpful. Please let us know if there is anything we can do to improve the service you receive.   Today we discussed concerns with:  I AM SO PROUD OF YOU!!!!  Call or send a message and we can see if we have samples for you of the ozempic   Important Office Information Lab Results If labs were ordered, please note that you will see results through MyChart as soon as they come available from [facility].  It takes up to 5 business days for the results to be routed to me and for me to review them once all of the lab results have come through from [facility]. I will make recommendations based on your results and send these through MyChart or someone from the office will call you to discuss. If your labs are abnormal, we may contact you to schedule a visit to discuss the results and make recommendations.  If you have not heard from [place] within 5 business days or you have waited longer than a week and your lab results have not come through on MyChart, please feel free to call the office or send a message through MyChart to follow-up on these labs.   Referrals If referrals were placed today, the office where the referral was sent will contact you either by phone or through MyChart to set up scheduling. Please note that it can take up to a week for the referral office to contact you. If you do not hear from them in a week, please contact the referral office directly to inquire about scheduling.   Condition Treated If your condition worsens or you begin to have new symptoms, please schedule a follow-up appointment for further evaluation. If you are not sure if an appointment is needed, you may call the office to leave a message for the nurse and someone will contact you with recommendations.  If you have an urgent or life threatening emergency, please do not call the office, but seek emergency evaluation by calling 911 or going to  the nearest emergency room for evaluation.   MyChart and Phone Calls Please do not use MyChart for urgent messages. It may take up to 3 business days for MyChart messages to be read by staff and if they are unable to handle the request, an additional 3 business days for them to be routed to me and for my response.  Messages sent to the provider through MyChart do not come directly to the provider, please allow time for these messages to be routed and for me to respond.  We get a large volume of MyChart messages daily and these are responded to in the order received.   For urgent messages, please call the office at [phone] and speak with the front office staff or leave a message on the line of my assistant for guidance.  We are seeing patients from the hours of 8:00 am through 5:00 pm and calls directly to the nurse may not be answered immediately due to seeing patients, but your call will be returned as soon as possible.  Phone  messages received after 4:00 PM Monday through Thursday may not be returned until the following business day. Phone messages received after 11:00 AM on Friday may not be returned until Monday.   After Hours We share on call hours with providers from other offices. If you have an urgent need after hours that cannot  wait until the next business day, please contact the on call provider by calling the office number. A nurse will speak with you and contact the provider if needed for recommendations.  If you have an urgent or life threatening emergency after hours, please do not call the on call provider, but seek emergency evaluation by calling 911 or going to the nearest emergency room for evaluation.   Paperwork All paperwork requires a minimum of 5 days to complete and return to you or the designated personnel. Please keep this in mind when bringing in forms or sending requests for paperwork completion to the office.

## 2021-10-21 NOTE — Assessment & Plan Note (Signed)
Chronic.  Recent A1c within normal limits.  Patient is doing very well on semaglutide.  She has made significant changes to her diet and activity levels.  I am very proud of her for all of her efforts and the significant changes that I have seen in her over the past few months.  Encourage patient to continue with her current efforts.  Sample provided today for 4-week supply of semaglutide as patient's insurance does not cover this.  Given the significant benefit of the medication on the patient thus far I do feel that continuation is appropriate.  We will plan for follow-up in 6 months or sooner if needed.

## 2021-11-15 ENCOUNTER — Encounter (HOSPITAL_BASED_OUTPATIENT_CLINIC_OR_DEPARTMENT_OTHER): Payer: Self-pay | Admitting: Nurse Practitioner

## 2021-11-26 ENCOUNTER — Other Ambulatory Visit: Payer: Self-pay | Admitting: Internal Medicine

## 2021-11-26 DIAGNOSIS — E890 Postprocedural hypothyroidism: Secondary | ICD-10-CM

## 2021-11-29 ENCOUNTER — Ambulatory Visit: Payer: Medicaid Other | Admitting: Physician Assistant

## 2021-12-05 ENCOUNTER — Encounter (HOSPITAL_BASED_OUTPATIENT_CLINIC_OR_DEPARTMENT_OTHER): Payer: Self-pay | Admitting: Surgery

## 2021-12-05 ENCOUNTER — Other Ambulatory Visit: Payer: Self-pay

## 2021-12-05 NOTE — Progress Notes (Signed)
Reviewed PAT plan with Dr Clemens Catholic, will get BMET prior to [facility]. Pt will come for lab appt.

## 2021-12-07 ENCOUNTER — Other Ambulatory Visit: Payer: Self-pay | Admitting: Internal Medicine

## 2021-12-10 ENCOUNTER — Encounter (HOSPITAL_BASED_OUTPATIENT_CLINIC_OR_DEPARTMENT_OTHER): Payer: Self-pay | Admitting: Nurse Practitioner

## 2021-12-11 NOTE — Anesthesia Preprocedure Evaluation (Signed)
Anesthesia Evaluation  Patient identified by MRN, date of birth, ID band Patient awake    Reviewed: Allergy & Precautions, NPO status , Patient's Chart, lab work & pertinent test results  History of Anesthesia Complications Negative for: history of anesthetic complications  Airway Mallampati: II  TM Distance: >3 FB Neck ROM: Full    Dental no notable dental hx.    Pulmonary asthma , Current Smoker,    Pulmonary exam normal        Cardiovascular negative cardio ROS Normal cardiovascular exam     Neuro/Psych Anxiety Depression Bipolar Disorder negative neurological ROS     GI/Hepatic negative GI ROS, (+)     substance abuse  marijuana use,   Endo/Other  Hypothyroidism Taking Ozempic  Renal/GU negative Renal ROS  negative genitourinary   Musculoskeletal negative musculoskeletal ROS (+)   Abdominal   Peds  Hematology negative hematology ROS (+)   Anesthesia Other Findings HIDRADENITIS AXILLARIS  Reproductive/Obstetrics negative OB ROS                            Anesthesia Physical Anesthesia Plan  ASA: 2  Anesthesia Plan: General   Post-op Pain Management: Tylenol PO (pre-op)*   Induction:   PONV Risk Score and Plan: 2 and Treatment may vary due to age or medical condition, Midazolam, Scopolamine patch - Pre-op, Ondansetron and Dexamethasone  Airway Management Planned: LMA  Additional Equipment: None  Intra-op Plan:   Post-operative Plan: Extubation in OR  Informed Consent: I have reviewed the patients History and Physical, chart, labs and discussed the procedure including the risks, benefits and alternatives for the proposed anesthesia with the patient or authorized representative who has indicated his/her understanding and acceptance.     Dental advisory given  Plan Discussed with: CRNA  Anesthesia Plan Comments:       Anesthesia Quick Evaluation

## 2021-12-12 ENCOUNTER — Encounter (HOSPITAL_BASED_OUTPATIENT_CLINIC_OR_DEPARTMENT_OTHER)
Admission: RE | Admit: 2021-12-12 | Discharge: 2021-12-12 | Disposition: A | Discharge status: Home / Self Care | Payer: 59 | Source: Ambulatory Visit | Attending: Surgery | Admitting: Surgery

## 2021-12-12 ENCOUNTER — Other Ambulatory Visit (INDEPENDENT_AMBULATORY_CARE_PROVIDER_SITE_OTHER): Payer: 59

## 2021-12-12 DIAGNOSIS — E890 Postprocedural hypothyroidism: Secondary | ICD-10-CM | POA: Diagnosis not present

## 2021-12-12 DIAGNOSIS — E039 Hypothyroidism, unspecified: Secondary | ICD-10-CM | POA: Diagnosis not present

## 2021-12-12 DIAGNOSIS — F17210 Nicotine dependence, cigarettes, uncomplicated: Secondary | ICD-10-CM | POA: Diagnosis not present

## 2021-12-12 DIAGNOSIS — Z7985 Long-term (current) use of injectable non-insulin antidiabetic drugs: Secondary | ICD-10-CM | POA: Diagnosis not present

## 2021-12-12 DIAGNOSIS — L732 Hidradenitis suppurativa: Secondary | ICD-10-CM | POA: Diagnosis present

## 2021-12-12 LAB — BASIC METABOLIC PANEL
Anion gap: 6 (ref 5–15)
BUN: 6 mg/dL (ref 6–20)
CO2: 25 mmol/L (ref 22–32)
Calcium: 8.9 mg/dL (ref 8.9–10.3)
Chloride: 106 mmol/L (ref 98–111)
Creatinine, Ser: 0.97 mg/dL (ref 0.44–1.00)
GFR, Estimated: >60 mL/min (ref >60)
Glucose, Bld: 85 mg/dL (ref 70–99)
Potassium: 3.9 mmol/L (ref 3.5–5.1)
Sodium: 137 mmol/L (ref 135–145)

## 2021-12-12 LAB — TSH: TSH: 4.27 u[IU]/mL (ref 0.35–5.50)

## 2021-12-12 LAB — T4, FREE: Free T4: 0.97 ng/dL (ref 0.60–1.60)

## 2021-12-12 NOTE — H&P (Signed)
REFERRING PHYSICIAN: Salem Senate, MD  PROVIDER: Wayne Both, MD  MRN: [id number] DOB: [birth year]  Subjective   Chief Complaint: New Patient (New patient hidradenitis )   History of Present Illness: [name] Curry is a 41 y.o. female who is seen tas an office consultation for evaluation of New Patient (New patient hidradenitis ) .   She is referred here for evaluation of hidradenitis in the left axilla. She reports she has had the area draining for at least 20 years and has had intermittent incision and drainage procedures performed. She reports she does not get it where else on her body except potentially the gluteal cleft. She is otherwise healthy without complaints. She currently reports that her most recent attack is subsiding  Review of Systems: A complete review of systems was obtained from the patient. I have reviewed this information and discussed as appropriate with the patient. See HPI as well for other ROS.  ROS   Medical History: Past Medical History:  Diagnosis Date   Thyroid disease   There is no problem list on file for this patient.  Past Surgical History:  Procedure Laterality Date   THYROIDECTOMY TOTAL    Allergies  Allergen Reactions   Methimazole Hives and Other (See Comments)   Penicillins Hives, Nausea And Vomiting and Other (See Comments)   Has patient had a PCN reaction causing immediate rash, facial/tongue/throat swelling, SOB or lightheadedness with hypotension: #  #  #  YES  #  #  #  Has patient had a PCN reaction causing severe rash involving mucus membranes or skin necrosis: NoHas patient had a PCN reaction that required hospitalization No Has patient had a PCN reaction occurring within the last 10 years: No If all of the above answers are "NO", then may proceed with Cephalosporin use.  Has patient had a PCN reaction causing immediate rash, facial/tongue/throat swelling, SOB or lightheadedness with hypotension: #  #  #  YES  #   #  #  Has patient had a PCN reaction causing severe rash involving mucus membranes or skin necrosis: NoHas patient had a PCN reaction that required hospitalization No Has patient had a PCN reaction occurring within the last 10 years: No If all of the above answers are "NO", then may proceed with Cephalosporin use.   Sulfa (Sulfonamide Antibiotics) Nausea And Vomiting, Other (See Comments) and Hives   Atenolol Itching and Other (See Comments)  Muscle cramps Muscle cramps Muscle cramps   Current Outpatient Medications on File Prior to Visit  Medication Sig Dispense Refill   hydrOXYzine (ATARAX) 50 MG tablet Take by mouth   levothyroxine (SYNTHROID) 88 MCG tablet Take by mouth   semaglutide (OZEMPIC) 1 mg/dose (4 mg/3 mL) pen injector Inject 1 mg as directed once a week. PEN 3, Start after completing PEN 2   No current facility-administered medications on file prior to visit.   History reviewed. No pertinent family history.   Social History   Tobacco Use  Smoking Status Every Day   Types: Cigarettes  Smokeless Tobacco Current    Social History   Socioeconomic History   Marital status: Married  Tobacco Use   Smoking status: Every Day  Types: Cigarettes   Smokeless tobacco: Current  Substance and Sexual Activity   Alcohol use: Never   Drug use: Yes  Types: Marijuana   Objective:   Vitals: BP: 122/70  Pulse: 80  Temp: 36.6 C (97.8 F)  SpO2: 98%  Weight: 89.3 kg (196 lb 12.8  oz)  Height: 157.5 cm (5\' 2" )   Body mass index is 36 kg/m.  Physical Exam   On exam, she appears well  There are chronic skin changes with the wound in the left axilla without current drainage and minimal tenderness  Labs, Imaging and Diagnostic Testing: I have reviewed her notes in the electronic medical records  Assessment and Plan:   Diagnoses and all orders for this visit:  Hidradenitis axillaris  Other orders - doxycycline (MONODOX) 100 MG capsule; Take 1 capsule (100 mg  total) by mouth 2 (two) times daily for 10 days    I had a long discussion with the patient regarding hidradenitis. We discussed the causes and the fact this is not a curable disease and surgery is limited to chronic draining sinus tracts. I believe she does need wide excision of this hidradenitis in the left axilla to control this area. I discussed the surgical procedure in detail. We discussed the risk which includes but is not limited to bleeding, infection, having a chronic open wound, breakdown of the incision, postoperative recovery, etc. She understands and wishes to proceed with surgery which will be scheduled.

## 2021-12-12 NOTE — Telephone Encounter (Signed)
Patient came in for labs today and is requesting a refill of Levothyroxine to [facility] [address]

## 2021-12-13 ENCOUNTER — Telehealth: Payer: Self-pay

## 2021-12-13 ENCOUNTER — Encounter (HOSPITAL_BASED_OUTPATIENT_CLINIC_OR_DEPARTMENT_OTHER)
Admission: RE | Disposition: A | Discharge status: Home / Self Care | Payer: Self-pay | Source: Ambulatory Visit | Attending: Surgery

## 2021-12-13 ENCOUNTER — Other Ambulatory Visit: Payer: Self-pay | Admitting: Surgery

## 2021-12-13 ENCOUNTER — Ambulatory Visit (HOSPITAL_BASED_OUTPATIENT_CLINIC_OR_DEPARTMENT_OTHER): Payer: 59 | Admitting: Anesthesiology

## 2021-12-13 ENCOUNTER — Ambulatory Visit (HOSPITAL_BASED_OUTPATIENT_CLINIC_OR_DEPARTMENT_OTHER)
Admission: RE | Admit: 2021-12-13 | Discharge: 2021-12-13 | Disposition: A | Discharge status: Home / Self Care | Payer: 59 | Source: Ambulatory Visit | Attending: Surgery | Admitting: Surgery

## 2021-12-13 ENCOUNTER — Encounter (HOSPITAL_BASED_OUTPATIENT_CLINIC_OR_DEPARTMENT_OTHER): Payer: Self-pay | Admitting: Surgery

## 2021-12-13 ENCOUNTER — Other Ambulatory Visit: Payer: Self-pay

## 2021-12-13 DIAGNOSIS — E039 Hypothyroidism, unspecified: Secondary | ICD-10-CM | POA: Insufficient documentation

## 2021-12-13 DIAGNOSIS — Z01818 Encounter for other preprocedural examination: Secondary | ICD-10-CM

## 2021-12-13 DIAGNOSIS — F17210 Nicotine dependence, cigarettes, uncomplicated: Secondary | ICD-10-CM | POA: Insufficient documentation

## 2021-12-13 DIAGNOSIS — L732 Hidradenitis suppurativa: Secondary | ICD-10-CM | POA: Insufficient documentation

## 2021-12-13 DIAGNOSIS — Z7985 Long-term (current) use of injectable non-insulin antidiabetic drugs: Secondary | ICD-10-CM | POA: Insufficient documentation

## 2021-12-13 HISTORY — PX: HYDRADENITIS EXCISION: SHX5243

## 2021-12-13 LAB — POCT PREGNANCY, URINE: Preg Test, Ur: NEGATIVE

## 2021-12-13 SURGERY — EXCISION, HIDRADENITIS, AXILLA
Anesthesia: General | Site: Axilla | Laterality: Left

## 2021-12-13 MED ORDER — FENTANYL CITRATE (PF) 100 MCG/2ML IJ SOLN
INTRAMUSCULAR | Status: AC
  Filled 2021-12-13: qty 2

## 2021-12-13 MED ORDER — ACETAMINOPHEN 500 MG PO TABS
1000.0000 mg | ORAL_TABLET | Freq: Once | ORAL | Status: AC
  Administered 2021-12-13: 1000 mg via ORAL

## 2021-12-13 MED ORDER — PROPOFOL 500 MG/50ML IV EMUL
INTRAVENOUS | Status: AC
  Filled 2021-12-13: qty 50

## 2021-12-13 MED ORDER — OXYMETAZOLINE HCL 0.05 % NA SOLN
NASAL | Status: AC
  Filled 2021-12-13: qty 60

## 2021-12-13 MED ORDER — LIDOCAINE-EPINEPHRINE 1 %-1:100000 IJ SOLN
INTRAMUSCULAR | Status: AC
  Filled 2021-12-13: qty 1

## 2021-12-13 MED ORDER — LIDOCAINE HCL (CARDIAC) PF 100 MG/5ML IV SOSY
PREFILLED_SYRINGE | INTRAVENOUS | Status: DC | PRN
  Administered 2021-12-13: 60 mg via INTRAVENOUS

## 2021-12-13 MED ORDER — MIDAZOLAM HCL 2 MG/2ML IJ SOLN
INTRAMUSCULAR | Status: AC
  Filled 2021-12-13: qty 2

## 2021-12-13 MED ORDER — LEVOTHYROXINE SODIUM 75 MCG PO TABS: 75.0000 ug | ORAL_TABLET | Freq: Every day | ORAL | 3 refills | Status: DC

## 2021-12-13 MED ORDER — ONDANSETRON HCL 4 MG/2ML IJ SOLN
INTRAMUSCULAR | Status: DC | PRN
  Administered 2021-12-13: 4 mg via INTRAVENOUS

## 2021-12-13 MED ORDER — ACETAMINOPHEN 500 MG PO TABS
ORAL_TABLET | ORAL | Status: AC
  Filled 2021-12-13: qty 2

## 2021-12-13 MED ORDER — BUPIVACAINE-EPINEPHRINE (PF) 0.5% -1:200000 IJ SOLN
INTRAMUSCULAR | Status: AC
  Filled 2021-12-13: qty 30

## 2021-12-13 MED ORDER — DEXAMETHASONE SODIUM PHOSPHATE 10 MG/ML IJ SOLN
INTRAMUSCULAR | Status: AC
  Filled 2021-12-13: qty 1

## 2021-12-13 MED ORDER — SCOPOLAMINE 1 MG/3DAYS TD PT72
MEDICATED_PATCH | TRANSDERMAL | Status: AC
  Filled 2021-12-13: qty 1

## 2021-12-13 MED ORDER — FENTANYL CITRATE (PF) 100 MCG/2ML IJ SOLN: 25.0000 ug | INTRAMUSCULAR | Status: DC | PRN

## 2021-12-13 MED ORDER — CEFAZOLIN SODIUM-DEXTROSE 2-3 GM-%(50ML) IV SOLR
INTRAVENOUS | Status: DC | PRN
  Administered 2021-12-13: 2 g via INTRAVENOUS

## 2021-12-13 MED ORDER — BUPIVACAINE-EPINEPHRINE 0.5% -1:200000 IJ SOLN
INTRAMUSCULAR | Status: DC | PRN
  Administered 2021-12-13: 15 mL

## 2021-12-13 MED ORDER — MIDAZOLAM HCL 5 MG/5ML IJ SOLN
INTRAMUSCULAR | Status: DC | PRN
  Administered 2021-12-13: 2 mg via INTRAVENOUS

## 2021-12-13 MED ORDER — ONDANSETRON HCL 4 MG/2ML IJ SOLN
INTRAMUSCULAR | Status: AC
  Filled 2021-12-13: qty 2

## 2021-12-13 MED ORDER — DEXAMETHASONE SODIUM PHOSPHATE 4 MG/ML IJ SOLN
INTRAMUSCULAR | Status: DC | PRN
  Administered 2021-12-13: 10 mg via INTRAVENOUS

## 2021-12-13 MED ORDER — LIDOCAINE 2% (20 MG/ML) 5 ML SYRINGE
INTRAMUSCULAR | Status: AC
  Filled 2021-12-13: qty 5

## 2021-12-13 MED ORDER — PROPOFOL 10 MG/ML IV BOLUS
INTRAVENOUS | Status: DC | PRN
  Administered 2021-12-13: 150 mg via INTRAVENOUS
  Administered 2021-12-13 (×2): 50 mg via INTRAVENOUS

## 2021-12-13 MED ORDER — OXYCODONE HCL 5 MG PO TABS: 5.0000 mg | ORAL_TABLET | Freq: Four times a day (QID) | ORAL | 0 refills | Status: DC | PRN

## 2021-12-13 MED ORDER — FENTANYL CITRATE (PF) 100 MCG/2ML IJ SOLN
INTRAMUSCULAR | Status: DC | PRN
  Administered 2021-12-13 (×2): 50 ug via INTRAVENOUS

## 2021-12-13 MED ORDER — OXYCODONE HCL 5 MG PO TABS: 5.0000 mg | ORAL_TABLET | Freq: Once | ORAL | Status: DC | PRN

## 2021-12-13 MED ORDER — LACTATED RINGERS IV SOLN: INTRAVENOUS | Status: DC

## 2021-12-13 MED ORDER — SCOPOLAMINE 1 MG/3DAYS TD PT72
1.0000 | MEDICATED_PATCH | Freq: Once | TRANSDERMAL | Status: DC
  Administered 2021-12-13: 1.5 mg via TRANSDERMAL

## 2021-12-13 MED ORDER — 0.9 % SODIUM CHLORIDE (POUR BTL) OPTIME
TOPICAL | Status: DC | PRN
  Administered 2021-12-13: 100 mL

## 2021-12-13 MED ORDER — OXYCODONE HCL 5 MG/5ML PO SOLN: 5.0000 mg | Freq: Once | ORAL | Status: DC | PRN

## 2021-12-13 MED ORDER — CEFAZOLIN SODIUM-DEXTROSE 2-4 GM/100ML-% IV SOLN
INTRAVENOUS | Status: AC
  Filled 2021-12-13: qty 100

## 2021-12-13 SURGICAL SUPPLY — 41 items
ADH SKN CLS APL DERMABOND .7 (GAUZE/BANDAGES/DRESSINGS)
APL PRP STRL LF DISP 70% ISPRP (MISCELLANEOUS) ×1
BLADE CLIPPER SURG (BLADE) ×1 IMPLANT
BLADE SURG 15 STRL LF DISP TIS (BLADE) ×1 IMPLANT
BLADE SURG 15 STRL SS (BLADE) ×2
CANISTER SUCT 1200ML W/VALVE (MISCELLANEOUS) ×2 IMPLANT
CHLORAPREP W/TINT 26 (MISCELLANEOUS) ×2 IMPLANT
COVER BACK TABLE 60X90IN (DRAPES) ×2 IMPLANT
COVER MAYO STAND STRL (DRAPES) ×2 IMPLANT
DERMABOND ADVANCED (GAUZE/BANDAGES/DRESSINGS)
DERMABOND ADVANCED .7 DNX12 (GAUZE/BANDAGES/DRESSINGS) ×1 IMPLANT
DRAPE LAPAROTOMY 100X72 PEDS (DRAPES) ×2 IMPLANT
DRAPE UTILITY XL STRL (DRAPES) ×2 IMPLANT
DRSG PAD ABDOMINAL 8X10 ST (GAUZE/BANDAGES/DRESSINGS) ×1 IMPLANT
ELECT REM PT RETURN 9FT ADLT (ELECTROSURGICAL) ×2
ELECTRODE REM PT RTRN 9FT ADLT (ELECTROSURGICAL) ×1 IMPLANT
GLOVE BIOGEL PI IND STRL 7.0 (GLOVE) IMPLANT
GLOVE BIOGEL PI INDICATOR 7.0 (GLOVE) ×1
GLOVE SURG SIGNA 7.5 PF LTX (GLOVE) ×2 IMPLANT
GLOVE SURG SS PI 7.5 STRL IVOR (GLOVE) ×1 IMPLANT
GOWN STRL REUS W/ TWL LRG LVL3 (GOWN DISPOSABLE) ×1 IMPLANT
GOWN STRL REUS W/ TWL XL LVL3 (GOWN DISPOSABLE) ×1 IMPLANT
GOWN STRL REUS W/TWL LRG LVL3 (GOWN DISPOSABLE)
GOWN STRL REUS W/TWL XL LVL3 (GOWN DISPOSABLE) ×4
NDL HYPO 25X1 1.5 SAFETY (NEEDLE) ×1 IMPLANT
NEEDLE HYPO 25X1 1.5 SAFETY (NEEDLE) ×2 IMPLANT
NS IRRIG 1000ML POUR BTL (IV SOLUTION) ×2 IMPLANT
PACK BASIN DAY SURGERY FS (CUSTOM PROCEDURE TRAY) ×2 IMPLANT
PENCIL SMOKE EVACUATOR (MISCELLANEOUS) ×2 IMPLANT
SLEEVE SCD COMPRESS KNEE MED (STOCKING) ×1 IMPLANT
SPIKE FLUID TRANSFER (MISCELLANEOUS) IMPLANT
SPONGE T-LAP 4X18 ~~LOC~~+RFID (SPONGE) ×2 IMPLANT
SUT ETHILON 2 0 FS 18 (SUTURE) ×2 IMPLANT
SUT ETHILON 3 0 FSL (SUTURE) IMPLANT
SUT VIC AB 3-0 SH 27 (SUTURE) ×2
SUT VIC AB 3-0 SH 27X BRD (SUTURE) IMPLANT
SYR BULB EAR ULCER 3OZ GRN STR (SYRINGE) ×2 IMPLANT
SYR CONTROL 10ML LL (SYRINGE) ×2 IMPLANT
TOWEL GREEN STERILE FF (TOWEL DISPOSABLE) ×2 IMPLANT
TUBE CONNECTING 20X1/4 (TUBING) ×2 IMPLANT
YANKAUER SUCT BULB TIP NO VENT (SUCTIONS) ×2 IMPLANT

## 2021-12-13 NOTE — Transfer of Care (Signed)
Immediate Anesthesia Transfer of Care Note  Patient: [name] Curry  Procedure(s) Performed: EXCISION HIDRADENITIS LEFT AXILLA (Left: Axilla)  Patient Location: PACU  Anesthesia Type:General  Level of Consciousness: awake, alert  and oriented  Airway & Oxygen Therapy: Patient Spontanous Breathing and Patient connected to face mask oxygen  Post-op Assessment: Report given to RN and Post -op Vital signs reviewed and stable  Post vital signs: Reviewed and stable  Last Vitals:  Vitals Value Taken Time  BP 119/65 12/13/21 0910  Temp    Pulse 97 12/13/21 0911  Resp 21 12/13/21 0911  SpO2 99 % 12/13/21 0911  Vitals shown include unvalidated device data.  Last Pain:  Vitals:   12/13/21 0735  TempSrc: Oral  PainSc: 0-No pain      Patients Stated Pain Goal: 3 (12/13/21 0735)  Complications: No notable events documented.

## 2021-12-13 NOTE — Op Note (Signed)
EXCISION HIDRADENITIS LEFT AXILLA  Procedure Note  [name] Curry 12/13/2021   Pre-op Diagnosis: HIDRADENITIS AXILLARIS     Post-op Diagnosis:   Procedure(s):  WIDE EXCISION HIDRADENITIS LEFT AXILLA  Surgeon(s): Abigail Miyamoto, MD  Anesthesia: General  Staff:  Circulator: Lenn Cal, RN Scrub Person: Jorge Mandril; Lilia Argue J  Estimated Blood Loss: Minimal               Findings: The tissue in the left axilla was consistent with hidradenitis.  I excised a 6 cm x 2 cm area of skin and subcutaneous tissue in the left axilla  Procedure: The patient was brought to the operating room identifies correct patient.  She was placed upon the operating table general esthesia was induced.  Her left axilla was then prepped and draped in the usual sterile fashion.  I anesthetized the skin at the area of the chronic draining sinus tracts with Marcaine.  I then performed elliptical incision with a scalpel circumferentially around the area of hidradenitis.  I then dissected down to the subcutaneous tissue with electrocautery.  Underneath the skin there was obvious granulation tissue consistent with hidradenitis.  I excised approximate 6 cm x 2 cm portion of skin and subcutaneous tissue containing hidradenitis with electrocautery.  Once this was removed there was no other obvious inflamed tissue.  I achieved hemostasis with the cautery.  I irrigated the wound with saline.  I injected further Marcaine to the incision.  I then loosely reapproximated the subcutaneous tissue with interrupted 3-0 Vicryl sutures.  I then closed the skin with interrupted and horizontal mattress 2-0 nylon sutures.  Gauze and tape were then applied.  The patient tolerated the procedure well.  All the counts were correct at the end of the procedure.  The patient was then extubated in the operating room and taken in stable condition to the recovery room.          Abigail Miyamoto   Date: 12/13/2021  Time: 9:03  AM

## 2021-12-13 NOTE — Anesthesia Postprocedure Evaluation (Signed)
Anesthesia Post Note  Patient: [name] Curry  Procedure(s) Performed: EXCISION HIDRADENITIS LEFT AXILLA (Left: Axilla)     Patient location during evaluation: PACU Anesthesia Type: General Level of consciousness: awake and alert Pain management: pain level controlled Vital Signs Assessment: post-procedure vital signs reviewed and stable Respiratory status: spontaneous breathing, nonlabored ventilation and respiratory function stable Cardiovascular status: blood pressure returned to baseline Postop Assessment: no apparent nausea or vomiting Anesthetic complications: no   No notable events documented.  Last Vitals:  Vitals:   12/13/21 0930 12/13/21 0951  BP: 119/69 115/75  Pulse: 72 61  Resp: 17 16  Temp:  36.7 C  SpO2: 100% 100%    Last Pain:  Vitals:   12/13/21 0951  TempSrc:   PainSc: 0-No pain                 Shanda Howells

## 2021-12-13 NOTE — Discharge Instructions (Addendum)
You may shower starting tomorrow  Cover the incision with dry gauze and change daily and as needed  Expect drainage from the incision daily   No vigorous activity until the sutures are removed  Call our office to set up the appointment for 2 weeks   No tylenol until 1:30pm   Post Anesthesia Home Care Instructions  Activity: Get plenty of rest for the remainder of the day. A responsible individual must stay with you for 24 hours following the procedure.  For the next 24 hours, DO NOT: -Drive a car -Advertising copywriter -Drink alcoholic beverages -Take any medication unless instructed by your physician -Make any legal decisions or sign important papers.  Meals: Start with liquid foods such as gelatin or soup. Progress to regular foods as tolerated. Avoid greasy, spicy, heavy foods. If nausea and/or vomiting occur, drink only clear liquids until the nausea and/or vomiting subsides. Call your physician if vomiting continues.  Special Instructions/Symptoms: Your throat may feel dry or sore from the anesthesia or the breathing tube placed in your throat during surgery. If this causes discomfort, gargle with warm salt water. The discomfort should disappear within 24 hours.  If you had a scopolamine patch placed behind your ear for the management of post- operative nausea and/or vomiting:  1. The medication in the patch is effective for 72 hours, after which it should be removed.  Wrap patch in a tissue and discard in the trash. Wash hands thoroughly with soap and water. 2. You may remove the patch earlier than 72 hours if you experience unpleasant side effects which may include dry mouth, dizziness or visual disturbances. 3. Avoid touching the patch. Wash your hands with soap and water after contact with the patch.

## 2021-12-13 NOTE — Telephone Encounter (Signed)
OK, Refilled.

## 2021-12-13 NOTE — Interval H&P Note (Signed)
History and Physical Interval Note:no change in H and P  12/13/2021 7:47 AM  [name]  has presented today for surgery, with the diagnosis of HIDRADENITIS AXILLARIS.  The various methods of treatment have been discussed with the patient and family. After consideration of risks, benefits and other options for treatment, the patient has consented to  Procedure(s): EXCISION HIDRADENITIS LEFT AXILLA (Left) as a surgical intervention.  The patient's history has been reviewed, patient examined, no change in status, stable for surgery.  I have reviewed the patient's chart and labs.  Questions were answered to the patient's satisfaction.     [name] Curry

## 2021-12-13 NOTE — Anesthesia Procedure Notes (Signed)
Procedure Name: LMA Insertion Date/Time: 12/13/2021 8:35 AM  Performed by: Burna Cash, CRNAPre-anesthesia Checklist: Patient identified, Emergency Drugs available, Suction available and Patient being monitored Patient Re-evaluated:Patient Re-evaluated prior to induction Oxygen Delivery Method: Circle system utilized Preoxygenation: Pre-oxygenation with 100% oxygen Induction Type: IV induction Ventilation: Mask ventilation without difficulty LMA: LMA inserted LMA Size: 4.0 Number of attempts: 1 Airway Equipment and Method: Bite block Placement Confirmation: positive ETCO2 Tube secured with: Tape Dental Injury: Teeth and Oropharynx as per pre-operative assessment

## 2021-12-13 NOTE — Telephone Encounter (Signed)
Pt called to advise she needs a refill for Levothyroxine. She came in for labs yesterday and wanted to confirm she was taking the 88 mcg 6 days a week as directed.

## 2021-12-14 ENCOUNTER — Encounter (HOSPITAL_BASED_OUTPATIENT_CLINIC_OR_DEPARTMENT_OTHER): Payer: Self-pay | Admitting: Surgery

## 2021-12-20 ENCOUNTER — Encounter (HOSPITAL_BASED_OUTPATIENT_CLINIC_OR_DEPARTMENT_OTHER): Payer: Self-pay

## 2021-12-23 ENCOUNTER — Encounter (HOSPITAL_BASED_OUTPATIENT_CLINIC_OR_DEPARTMENT_OTHER): Payer: Self-pay | Admitting: Nurse Practitioner

## 2021-12-26 ENCOUNTER — Encounter (HOSPITAL_BASED_OUTPATIENT_CLINIC_OR_DEPARTMENT_OTHER): Payer: Self-pay | Admitting: Nurse Practitioner

## 2022-01-04 ENCOUNTER — Other Ambulatory Visit (HOSPITAL_BASED_OUTPATIENT_CLINIC_OR_DEPARTMENT_OTHER): Payer: Self-pay | Admitting: Nurse Practitioner

## 2022-01-04 ENCOUNTER — Ambulatory Visit (INDEPENDENT_AMBULATORY_CARE_PROVIDER_SITE_OTHER): Payer: Commercial Managed Care - HMO | Admitting: Nurse Practitioner

## 2022-01-04 ENCOUNTER — Encounter (HOSPITAL_BASED_OUTPATIENT_CLINIC_OR_DEPARTMENT_OTHER): Payer: Self-pay | Admitting: Nurse Practitioner

## 2022-01-04 VITALS — BP 108/80 | HR 86 | Temp 98.7°F | Ht 62.0 in | Wt 160.0 lb

## 2022-01-04 DIAGNOSIS — Z1231 Encounter for screening mammogram for malignant neoplasm of breast: Secondary | ICD-10-CM | POA: Diagnosis not present

## 2022-01-04 DIAGNOSIS — M62838 Other muscle spasm: Secondary | ICD-10-CM | POA: Diagnosis not present

## 2022-01-04 DIAGNOSIS — E559 Vitamin D deficiency, unspecified: Secondary | ICD-10-CM

## 2022-01-04 DIAGNOSIS — M79602 Pain in left arm: Secondary | ICD-10-CM

## 2022-01-04 MED ORDER — CYCLOBENZAPRINE HCL 10 MG PO TABS: ORAL_TABLET | ORAL | 6 refills | Status: DC

## 2022-01-04 NOTE — Progress Notes (Signed)
Tollie Eth, DNP, AGNP-c [facility] [address]  [address] [place], [place] [address] [phone]  Subjective:   [name] Curry is a 41 y.o. female presents to day for arm pain Left arm pain Pain present for appx the last 4 weeks She denies any known injury to the arm  She tells me that the arm is sore on the anterior upper portion She denies weakness, mass, discoloration Occasionally she feels there are paresthesias present.  She tells me she is also having muscle spasms in the neck.   PMH, Medications, and Allergies reviewed and updated in chart.   ROS negative except for what is listed in HPI. Objective:  BP 108/80   Pulse 86   Temp 98.7 F (37.1 C)   Ht 5\' 2"  (1.575 m)   Wt 160 lb (72.6 kg)   LMP 12/13/2021 (Exact Date)   SpO2 99%   BMI 29.26 kg/m  Physical Exam Vitals and nursing note reviewed.  Constitutional:      Appearance: Normal appearance.  HENT:     Head: Normocephalic.  Neck:     Comments: Tight musculature noted to the trapezius on the left side.  Cardiovascular:     Rate and Rhythm: Normal rate and regular rhythm.     Pulses: Normal pulses.     Heart sounds: Normal heart sounds.  Pulmonary:     Effort: Pulmonary effort is normal.     Breath sounds: Normal breath sounds.  Musculoskeletal:       Arms:     Cervical back: Tenderness present.  Skin:    General: Skin is warm and dry.     Capillary Refill: Capillary refill takes less than 2 seconds.  Neurological:     General: No focal deficit present.     Mental Status: She is alert and oriented to person, place, and time.  Psychiatric:        Mood and Affect: Mood normal.        Behavior: Behavior normal.        Thought Content: Thought content normal.        Judgment: Judgment normal.           Assessment & Plan:   Problem List Items Addressed This Visit     Muscle spasms of neck - Primary    Spasms present to the trapezius on the left  side. I do suspect that these spasms are contributing to the pain she is experiencing in her left arm. Possible pinching of the nerve path due to spasms or abnormal use/posture of the arm to compensate for the spasms present. Recommend stretching, TENS unit, ice, heat, and ibuprofen to help with spasms. Will also send in flexeril to help relax the muscle. If symptoms do not improve, patient will follow-up      Arm pain, anterior, left    Proximal upper left arm pain anteriorly. Pain appears to correlate with the spasms noted in the neck. It is unclear if there are other factors going on to contribute. At this time her strength and ROM appear to be intact, which is reassuring. Discussion of stretches and exercises that may be helpful for both neck and shoulder/upper arm. We will obtain an 12/15/2021 of the area to ensure that there are no concerning findings present. Will make changes to plan of care based on findings. If normal imaging and no improvement, consider ortho referral.       Other Visit Diagnoses  Encounter for screening mammogram for malignant neoplasm of breast       Relevant Orders   MM 3D SCREEN BREAST BILATERAL        Tollie Eth, DNP, AGNP-c 02/06/2022  8:31 AM

## 2022-01-04 NOTE — Patient Instructions (Signed)
I do recommend a TENS unit to help with the spasms. Using this one to two times a week can really help keep the muscles looser. You can use it every day if you need to.   I have also sent in flexeril for you to use at bedtime when the pain is bad- usually 2-3 days in a row will help relax it out  I have attached stretches that can help keep your neck loose and upper back    I have ordered an ultrasound of the arm to see if there is anything present that is causing the pain.     1. You may have this done at the [facility], located in the [facility] Building on the 1st floor.    2. You do no have to have an appointment.    3. [address] [place], [place] [address]        [phone]        Monday - Friday  8:00 am - 5:00 pm 4. [facility]  [facility]. [facility] at [facility] is a premier diagnostic imaging center that offers a 64-slice CT scanner and interventional radiology services.

## 2022-01-05 ENCOUNTER — Inpatient Hospital Stay: Admission: RE | Admit: 2022-01-05 | Payer: Commercial Managed Care - HMO | Source: Ambulatory Visit

## 2022-01-17 ENCOUNTER — Other Ambulatory Visit (HOSPITAL_BASED_OUTPATIENT_CLINIC_OR_DEPARTMENT_OTHER): Payer: Self-pay | Admitting: Nurse Practitioner

## 2022-01-17 ENCOUNTER — Ambulatory Visit (HOSPITAL_BASED_OUTPATIENT_CLINIC_OR_DEPARTMENT_OTHER): Payer: 59 | Admitting: Nurse Practitioner

## 2022-01-17 ENCOUNTER — Ambulatory Visit
Admission: RE | Admit: 2022-01-17 | Discharge: 2022-01-17 | Disposition: A | Discharge status: Home / Self Care | Payer: Commercial Managed Care - HMO | Source: Ambulatory Visit | Attending: Nurse Practitioner | Admitting: Nurse Practitioner

## 2022-01-17 DIAGNOSIS — M79602 Pain in left arm: Secondary | ICD-10-CM

## 2022-01-24 ENCOUNTER — Encounter (HOSPITAL_BASED_OUTPATIENT_CLINIC_OR_DEPARTMENT_OTHER): Payer: Self-pay | Admitting: Nurse Practitioner

## 2022-01-27 ENCOUNTER — Encounter (HOSPITAL_BASED_OUTPATIENT_CLINIC_OR_DEPARTMENT_OTHER): Payer: Self-pay | Admitting: Nurse Practitioner

## 2022-01-27 NOTE — Telephone Encounter (Signed)
Provider declined sample due to supply concerns. Communicate with patient.

## 2022-02-03 ENCOUNTER — Encounter (HOSPITAL_COMMUNITY): Payer: Self-pay | Admitting: *Deleted

## 2022-02-03 ENCOUNTER — Inpatient Hospital Stay (HOSPITAL_COMMUNITY): Payer: Commercial Managed Care - HMO

## 2022-02-03 ENCOUNTER — Other Ambulatory Visit (HOSPITAL_BASED_OUTPATIENT_CLINIC_OR_DEPARTMENT_OTHER): Payer: Self-pay | Admitting: Nurse Practitioner

## 2022-02-03 ENCOUNTER — Inpatient Hospital Stay (HOSPITAL_COMMUNITY)
Admission: AD | Admit: 2022-02-03 | Discharge: 2022-02-03 | Disposition: A | Discharge status: Home / Self Care | Payer: Commercial Managed Care - HMO | Attending: Obstetrics & Gynecology | Admitting: Obstetrics & Gynecology

## 2022-02-03 ENCOUNTER — Encounter: Payer: Self-pay | Admitting: Internal Medicine

## 2022-02-03 ENCOUNTER — Encounter (HOSPITAL_BASED_OUTPATIENT_CLINIC_OR_DEPARTMENT_OTHER): Payer: Self-pay | Admitting: Nurse Practitioner

## 2022-02-03 DIAGNOSIS — O3680X0 Pregnancy with inconclusive fetal viability, not applicable or unspecified: Secondary | ICD-10-CM | POA: Diagnosis not present

## 2022-02-03 DIAGNOSIS — Z3A01 Less than 8 weeks gestation of pregnancy: Secondary | ICD-10-CM | POA: Diagnosis not present

## 2022-02-03 DIAGNOSIS — R110 Nausea: Secondary | ICD-10-CM | POA: Insufficient documentation

## 2022-02-03 DIAGNOSIS — R1030 Lower abdominal pain, unspecified: Secondary | ICD-10-CM | POA: Diagnosis not present

## 2022-02-03 DIAGNOSIS — O09521 Supervision of elderly multigravida, first trimester: Secondary | ICD-10-CM | POA: Diagnosis not present

## 2022-02-03 DIAGNOSIS — O26891 Other specified pregnancy related conditions, first trimester: Secondary | ICD-10-CM | POA: Insufficient documentation

## 2022-02-03 DIAGNOSIS — N83202 Unspecified ovarian cyst, left side: Secondary | ICD-10-CM | POA: Insufficient documentation

## 2022-02-03 HISTORY — DX: Thalassemia minor: D56.3

## 2022-02-03 LAB — URINALYSIS, ROUTINE W REFLEX MICROSCOPIC
Bilirubin Urine: NEGATIVE
Glucose, UA: NEGATIVE mg/dL
Hgb urine dipstick: NEGATIVE
Ketones, ur: NEGATIVE mg/dL
Leukocytes,Ua: NEGATIVE
Nitrite: NEGATIVE
Protein, ur: NEGATIVE mg/dL
Specific Gravity, Urine: 1.031 — ABNORMAL HIGH (ref 1.005–1.030)
pH: 5 (ref 5.0–8.0)

## 2022-02-03 LAB — CBC
HCT: 38.3 % (ref 36.0–46.0)
Hemoglobin: 12.8 g/dL (ref 12.0–15.0)
MCH: 25.7 pg — ABNORMAL LOW (ref 26.0–34.0)
MCHC: 33.4 g/dL (ref 30.0–36.0)
MCV: 76.9 fL — ABNORMAL LOW (ref 80.0–100.0)
Platelets: 329 10*3/uL (ref 150–400)
RBC: 4.98 MIL/uL (ref 3.87–5.11)
RDW: 14.9 % (ref 11.5–15.5)
WBC: 8.5 10*3/uL (ref 4.0–10.5)
nRBC: 0 % (ref 0.0–0.2)

## 2022-02-03 LAB — WET PREP, GENITAL
Sperm: NONE SEEN
Trich, Wet Prep: NONE SEEN
WBC, Wet Prep HPF POC: <10 (ref <10)
Yeast Wet Prep HPF POC: NONE SEEN

## 2022-02-03 LAB — HCG, QUANTITATIVE, PREGNANCY: hCG, Beta Chain, Quant, S: 173 m[IU]/mL — ABNORMAL HIGH (ref <5)

## 2022-02-03 LAB — POCT PREGNANCY, URINE: Preg Test, Ur: POSITIVE — AB

## 2022-02-03 LAB — ABO/RH: ABO/RH(D): A POS
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
## 2022-02-03 NOTE — Discharge Instructions (Signed)
Return to care  If you have heavier bleeding that soaks through more than 2 pads per hour for an hour or more If you bleed so much that you feel like you might pass out or you do pass out If you have significant abdominal pain that is not improved with Tylenol   

## 2022-02-03 NOTE — MAU Note (Signed)
[name] Curry is a 41 y.o. at Unknown here in MAU reporting: +HPT x3.  Has struggled for yrs. Has thyroid issues.  Stopped Ozempic 2+wks ago.  Was to have come on 9/11, did have some spotting that day.  Has been cramping like her period was going to start and it never has.  Took the tests this morning.  LMP: 8/11 Onset of complaint: 9/12 Pain score: very mild Vitals:   02/03/22 1234  BP: 120/76  Pulse: 96  Resp: 18  Temp: 98.3 F (36.8 C)  SpO2: 100%     Lab orders placed from triage:  UPT, UA

## 2022-02-06 ENCOUNTER — Other Ambulatory Visit: Payer: Self-pay

## 2022-02-06 ENCOUNTER — Ambulatory Visit (INDEPENDENT_AMBULATORY_CARE_PROVIDER_SITE_OTHER): Payer: Commercial Managed Care - HMO | Admitting: General Practice

## 2022-02-06 VITALS — BP 119/80 | HR 67 | Ht 63.0 in | Wt 156.0 lb

## 2022-02-06 DIAGNOSIS — O3680X0 Pregnancy with inconclusive fetal viability, not applicable or unspecified: Secondary | ICD-10-CM

## 2022-02-06 DIAGNOSIS — M62838 Other muscle spasm: Secondary | ICD-10-CM | POA: Insufficient documentation

## 2022-02-06 DIAGNOSIS — M79602 Pain in left arm: Secondary | ICD-10-CM | POA: Insufficient documentation

## 2022-02-06 LAB — GC/CHLAMYDIA PROBE AMP (~~LOC~~) NOT AT ARMC
Chlamydia: NEGATIVE
Comment: NEGATIVE
Comment: NORMAL
Neisseria Gonorrhea: NEGATIVE

## 2022-02-06 LAB — BETA HCG QUANT (REF LAB): hCG Quant: 1194 m[IU]/mL

## 2022-02-06 NOTE — Progress Notes (Signed)
Beta HCG Follow-up Visit  [name] Curry presents to [facility] for follow-up beta HCG lab. She was seen in MAU for abdominal pain on 9/15. Patient reports  cramping   today but states it is less than Friday. Discussed with patient that we are following beta HCG levels today. Results will be back in approximately 2 hours. Valid contact number for patient confirmed. I will call the patient with results.   Beta HCG results:         9/15          173          9/18         1,194      Results and patient history reviewed with Dr Elgie Congo, who states patient had appropriate rise in bhcg levels & should have follow up ultrasound in 2 weeks. Scheduled ultrasound for 10/3 @ 10am. Patient called and informed of plan for follow-up. Advised if she had any bleeding or pain to go to MAU for evaluation. Patient verbalized understanding.   Derinda Late 02/06/2022 9:32 AM

## 2022-02-06 NOTE — Assessment & Plan Note (Signed)
Spasms present to the trapezius on the left side. I do suspect that these spasms are contributing to the pain she is experiencing in her left arm. Possible pinching of the nerve path due to spasms or abnormal use/posture of the arm to compensate for the spasms present. Recommend stretching, TENS unit, ice, heat, and ibuprofen to help with spasms. Will also send in flexeril to help relax the muscle. If symptoms do not improve, patient will follow-up

## 2022-02-06 NOTE — Assessment & Plan Note (Signed)
Proximal upper left arm pain anteriorly. Pain appears to correlate with the spasms noted in the neck. It is unclear if there are other factors going on to contribute. At this time her strength and ROM appear to be intact, which is reassuring. Discussion of stretches and exercises that may be helpful for both neck and shoulder/upper arm. We will obtain an US of the area to ensure that there are no concerning findings present. Will make changes to plan of care based on findings. If normal imaging and no improvement, consider ortho referral.

## 2022-02-07 ENCOUNTER — Encounter (HOSPITAL_BASED_OUTPATIENT_CLINIC_OR_DEPARTMENT_OTHER): Payer: Self-pay | Admitting: Advanced Practice Midwife

## 2022-02-10 ENCOUNTER — Other Ambulatory Visit: Payer: Self-pay

## 2022-02-10 ENCOUNTER — Inpatient Hospital Stay (HOSPITAL_COMMUNITY)
Admission: AD | Admit: 2022-02-10 | Discharge: 2022-02-10 | Disposition: A | Discharge status: Home / Self Care | Payer: Commercial Managed Care - HMO | Attending: Obstetrics and Gynecology | Admitting: Obstetrics and Gynecology

## 2022-02-10 ENCOUNTER — Inpatient Hospital Stay (HOSPITAL_COMMUNITY): Payer: Commercial Managed Care - HMO

## 2022-02-10 DIAGNOSIS — O3481 Maternal care for other abnormalities of pelvic organs, first trimester: Secondary | ICD-10-CM | POA: Diagnosis not present

## 2022-02-10 DIAGNOSIS — F17210 Nicotine dependence, cigarettes, uncomplicated: Secondary | ICD-10-CM | POA: Insufficient documentation

## 2022-02-10 DIAGNOSIS — F17290 Nicotine dependence, other tobacco product, uncomplicated: Secondary | ICD-10-CM | POA: Diagnosis not present

## 2022-02-10 DIAGNOSIS — N83201 Unspecified ovarian cyst, right side: Secondary | ICD-10-CM | POA: Diagnosis not present

## 2022-02-10 DIAGNOSIS — O99331 Smoking (tobacco) complicating pregnancy, first trimester: Secondary | ICD-10-CM | POA: Insufficient documentation

## 2022-02-10 DIAGNOSIS — O208 Other hemorrhage in early pregnancy: Secondary | ICD-10-CM | POA: Diagnosis not present

## 2022-02-10 DIAGNOSIS — O418X10 Other specified disorders of amniotic fluid and membranes, first trimester, not applicable or unspecified: Secondary | ICD-10-CM | POA: Diagnosis not present

## 2022-02-10 DIAGNOSIS — Z3A01 Less than 8 weeks gestation of pregnancy: Secondary | ICD-10-CM | POA: Diagnosis not present

## 2022-02-10 DIAGNOSIS — O468X1 Other antepartum hemorrhage, first trimester: Secondary | ICD-10-CM | POA: Diagnosis not present

## 2022-02-10 DIAGNOSIS — O26851 Spotting complicating pregnancy, first trimester: Secondary | ICD-10-CM | POA: Diagnosis present

## 2022-02-10 LAB — URINALYSIS, ROUTINE W REFLEX MICROSCOPIC
Bilirubin Urine: NEGATIVE
Glucose, UA: NEGATIVE mg/dL
Ketones, ur: NEGATIVE mg/dL
Leukocytes,Ua: NEGATIVE
Nitrite: NEGATIVE
Protein, ur: NEGATIVE mg/dL
Specific Gravity, Urine: 1.024 (ref 1.005–1.030)
pH: 5 (ref 5.0–8.0)

## 2022-02-10 LAB — WET PREP, GENITAL
Clue Cells Wet Prep HPF POC: NONE SEEN
Sperm: NONE SEEN
Trich, Wet Prep: NONE SEEN
WBC, Wet Prep HPF POC: <10 (ref <10)
Yeast Wet Prep HPF POC: NONE SEEN
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
## 2022-02-10 NOTE — MAU Note (Signed)
[name] Curry is a 41 y.o. at [redacted]w[redacted]d here in MAU reporting: spotting with wiping this morning.  States wiped twice and noticed light pink discharge on toilet tissue.  Denies pain or cramping.  Reports has ovarian cyst. LMP: N/A Onset of complaint: today Pain score: 0 Vitals:   02/10/22 1051  BP: 125/78  Pulse: 84  Resp: 18  Temp: 97.8 F (36.6 C)  SpO2: 100%     FHT:N/A Lab orders placed from triage:   UA

## 2022-02-21 ENCOUNTER — Other Ambulatory Visit: Payer: Commercial Managed Care - HMO

## 2022-02-21 ENCOUNTER — Ambulatory Visit (INDEPENDENT_AMBULATORY_CARE_PROVIDER_SITE_OTHER): Payer: Commercial Managed Care - HMO

## 2022-02-21 DIAGNOSIS — O3680X0 Pregnancy with inconclusive fetal viability, not applicable or unspecified: Secondary | ICD-10-CM

## 2022-02-23 ENCOUNTER — Encounter (HOSPITAL_COMMUNITY): Payer: Self-pay | Admitting: Obstetrics and Gynecology

## 2022-02-23 ENCOUNTER — Inpatient Hospital Stay (HOSPITAL_COMMUNITY): Payer: Commercial Managed Care - HMO

## 2022-02-23 ENCOUNTER — Inpatient Hospital Stay (HOSPITAL_COMMUNITY)
Admission: AD | Admit: 2022-02-23 | Discharge: 2022-02-23 | Disposition: A | Discharge status: Home / Self Care | Payer: Commercial Managed Care - HMO | Attending: Obstetrics and Gynecology | Admitting: Obstetrics and Gynecology

## 2022-02-23 ENCOUNTER — Encounter (HOSPITAL_BASED_OUTPATIENT_CLINIC_OR_DEPARTMENT_OTHER): Payer: Self-pay | Admitting: Obstetrics & Gynecology

## 2022-02-23 DIAGNOSIS — Z3A01 Less than 8 weeks gestation of pregnancy: Secondary | ICD-10-CM

## 2022-02-23 DIAGNOSIS — O200 Threatened abortion: Secondary | ICD-10-CM | POA: Diagnosis not present

## 2022-02-23 DIAGNOSIS — O09521 Supervision of elderly multigravida, first trimester: Secondary | ICD-10-CM | POA: Insufficient documentation

## 2022-02-23 DIAGNOSIS — O09523 Supervision of elderly multigravida, third trimester: Secondary | ICD-10-CM | POA: Insufficient documentation

## 2022-02-23 DIAGNOSIS — N939 Abnormal uterine and vaginal bleeding, unspecified: Secondary | ICD-10-CM | POA: Diagnosis not present

## 2022-02-23 LAB — CBC
HCT: 34.3 % — ABNORMAL LOW (ref 36.0–46.0)
Hemoglobin: 11.6 g/dL — ABNORMAL LOW (ref 12.0–15.0)
MCH: 25.8 pg — ABNORMAL LOW (ref 26.0–34.0)
MCHC: 33.8 g/dL (ref 30.0–36.0)
MCV: 76.4 fL — ABNORMAL LOW (ref 80.0–100.0)
Platelets: 294 10*3/uL (ref 150–400)
RBC: 4.49 MIL/uL (ref 3.87–5.11)
RDW: 14.7 % (ref 11.5–15.5)
WBC: 12.1 10*3/uL — ABNORMAL HIGH (ref 4.0–10.5)
nRBC: 0 % (ref 0.0–0.2)

## 2022-02-23 LAB — URINALYSIS, ROUTINE W REFLEX MICROSCOPIC
Bacteria, UA: NONE SEEN
Bilirubin Urine: NEGATIVE
Glucose, UA: NEGATIVE mg/dL
Ketones, ur: NEGATIVE mg/dL
Leukocytes,Ua: NEGATIVE
Nitrite: NEGATIVE
Protein, ur: 30 mg/dL — AB
RBC / HPF: >50 RBC/hpf — ABNORMAL HIGH (ref 0–5)
Specific Gravity, Urine: 1.024 (ref 1.005–1.030)
pH: 5 (ref 5.0–8.0)

## 2022-02-23 NOTE — Progress Notes (Signed)
Written and verbal d/c instructions given and understanding voiced. 
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
## 2022-02-23 NOTE — MAU Note (Signed)
.  [name] Curry is a 41 y.o. at [redacted]w[redacted]d here in MAU reporting vaginal bleeding since 2000. Occ mild cramping. No recent intercourse. Had u/s Tues and states everything looked good  LMP: 12/30/21 Onset of complaint: 2000 Pain score: 1 Vitals:   02/23/22 2042 02/23/22 2043  BP:  126/83  Pulse: 71   Resp: 17   Temp: 98.2 F (36.8 C)   SpO2: 100%      FHT:n/a Lab orders placed from triage: u/a

## 2022-02-27 ENCOUNTER — Ambulatory Visit (INDEPENDENT_AMBULATORY_CARE_PROVIDER_SITE_OTHER): Payer: Commercial Managed Care - HMO | Admitting: Internal Medicine

## 2022-02-27 ENCOUNTER — Encounter: Payer: Self-pay | Admitting: Internal Medicine

## 2022-02-27 VITALS — BP 118/62 | HR 91 | Ht 63.0 in | Wt 165.0 lb

## 2022-02-27 DIAGNOSIS — R7303 Prediabetes: Secondary | ICD-10-CM

## 2022-02-27 DIAGNOSIS — Z8639 Personal history of other endocrine, nutritional and metabolic disease: Secondary | ICD-10-CM | POA: Diagnosis not present

## 2022-02-27 DIAGNOSIS — E890 Postprocedural hypothyroidism: Secondary | ICD-10-CM | POA: Diagnosis not present

## 2022-02-27 LAB — TSH: TSH: 2.88 u[IU]/mL (ref 0.35–5.50)

## 2022-02-27 LAB — HEMOGLOBIN A1C: Hgb A1c MFr Bld: 6 % (ref 4.6–6.5)

## 2022-02-27 LAB — T4, FREE: Free T4: 0.7 ng/dL (ref 0.60–1.60)

## 2022-02-27 MED ORDER — LEVOTHYROXINE SODIUM 88 MCG PO TABS: 88.0000 ug | ORAL_TABLET | Freq: Every day | ORAL | 5 refills | Status: DC
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
## 2022-02-27 NOTE — Patient Instructions (Signed)
Please continue Levothyroxine 75 mcg daily + 2 extra tablets a week.  Take the thyroid hormone every day, with water, at least 30 minutes before breakfast, separated by at least 4 hours from: - acid reflux medications - calcium - iron - multivitamins  Please stop at the lab.  Please come back for a follow-up appointment in 4 months.

## 2022-03-08 ENCOUNTER — Ambulatory Visit (INDEPENDENT_AMBULATORY_CARE_PROVIDER_SITE_OTHER): Payer: Commercial Managed Care - HMO | Admitting: *Deleted

## 2022-03-08 ENCOUNTER — Other Ambulatory Visit (HOSPITAL_COMMUNITY)
Admission: RE | Admit: 2022-03-08 | Discharge: 2022-03-08 | Disposition: A | Discharge status: Home / Self Care | Payer: Commercial Managed Care - HMO | Source: Ambulatory Visit | Attending: Obstetrics & Gynecology | Admitting: Obstetrics & Gynecology

## 2022-03-08 ENCOUNTER — Other Ambulatory Visit (HOSPITAL_BASED_OUTPATIENT_CLINIC_OR_DEPARTMENT_OTHER): Payer: Commercial Managed Care - HMO

## 2022-03-08 ENCOUNTER — Encounter (HOSPITAL_BASED_OUTPATIENT_CLINIC_OR_DEPARTMENT_OTHER): Payer: Self-pay

## 2022-03-08 ENCOUNTER — Encounter (HOSPITAL_BASED_OUTPATIENT_CLINIC_OR_DEPARTMENT_OTHER): Payer: Self-pay | Admitting: Obstetrics & Gynecology

## 2022-03-08 ENCOUNTER — Ambulatory Visit (INDEPENDENT_AMBULATORY_CARE_PROVIDER_SITE_OTHER): Payer: Commercial Managed Care - HMO

## 2022-03-08 ENCOUNTER — Ambulatory Visit (INDEPENDENT_AMBULATORY_CARE_PROVIDER_SITE_OTHER): Payer: Commercial Managed Care - HMO | Admitting: Obstetrics & Gynecology

## 2022-03-08 VITALS — BP 127/79 | HR 82 | Ht 63.0 in | Wt 170.2 lb

## 2022-03-08 DIAGNOSIS — O3680X1 Pregnancy with inconclusive fetal viability, fetus 1: Secondary | ICD-10-CM | POA: Diagnosis not present

## 2022-03-08 DIAGNOSIS — Z3401 Encounter for supervision of normal first pregnancy, first trimester: Secondary | ICD-10-CM | POA: Diagnosis present

## 2022-03-08 DIAGNOSIS — Z3A08 8 weeks gestation of pregnancy: Secondary | ICD-10-CM

## 2022-03-08 DIAGNOSIS — R7303 Prediabetes: Secondary | ICD-10-CM

## 2022-03-08 DIAGNOSIS — O09521 Supervision of elderly multigravida, first trimester: Secondary | ICD-10-CM | POA: Diagnosis not present

## 2022-03-08 DIAGNOSIS — D563 Thalassemia minor: Secondary | ICD-10-CM | POA: Diagnosis not present

## 2022-03-08 DIAGNOSIS — O0990 Supervision of high risk pregnancy, unspecified, unspecified trimester: Secondary | ICD-10-CM | POA: Insufficient documentation

## 2022-03-08 DIAGNOSIS — O200 Threatened abortion: Secondary | ICD-10-CM

## 2022-03-08 DIAGNOSIS — O0991 Supervision of high risk pregnancy, unspecified, first trimester: Secondary | ICD-10-CM | POA: Diagnosis not present

## 2022-03-08 DIAGNOSIS — O209 Hemorrhage in early pregnancy, unspecified: Secondary | ICD-10-CM | POA: Diagnosis not present

## 2022-03-08 DIAGNOSIS — E890 Postprocedural hypothyroidism: Secondary | ICD-10-CM

## 2022-03-08 DIAGNOSIS — Z8639 Personal history of other endocrine, nutritional and metabolic disease: Secondary | ICD-10-CM

## 2022-03-08 NOTE — Progress Notes (Signed)
New OB Intake  I explained I am completing New OB Intake today. We discussed her EDD of 10/15/22 that is based on u/s of 6+2 on 02/21/22. Pt is G1/P0. I reviewed her allergies, medications, Medical/Surgical/OB history, and appropriate screenings. I informed her of [facility] services. [facility] information placed in AVS. Based on history, this is a/an  pregnancy complicated by AMA  .   Patient Active Problem List   Diagnosis Date Noted   Supervision of high risk pregnancy, antepartum 03/08/2022   Threatened miscarriage in early pregnancy 02/23/2022   AMA (advanced maternal age) multigravida 35+, first trimester 02/23/2022   Muscle spasms of neck 02/06/2022   Thalassemia trait, beta 10/07/2021   Heart palpitations 10/07/2021   NSVT (nonsustained ventricular tachycardia) ([place]) 10/07/2021   Pre-diabetes 07/22/2021   Vitamin D deficiency 07/22/2021   Mixed hyperlipidemia 07/22/2021   BMI 36.0-36.9,adult 07/14/2021   Family history of breast cancer in sister 10/22/2019   H/O Graves' disease 09/05/2016   Mass of breast 04/21/2014   Routine general medical examination at a health care facility 04/21/2014   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    Concerns addressed today  Delivery Plans Plans to deliver at [facility]. Patient given information for [facility] Healthy Baby website for more information about [facility] [place]. Patient is not interested in water birth.   MyChart/Babyscripts MyChart access verified. I explained pt will have some visits in office and some virtually. Babyscripts instructions given and order placed.  Blood Pressure Cuff/Weight Scale Patient has private insurance; instructed to purchase blood pressure cuff and bring to first prenatal appt. Explained after first prenatal appt pt will check weekly and document in 22.   Anatomy US Explained first scheduled [place] will be around 19 weeks. Anatomy US scheduled for 05/23/21 at 0930. Pt notified to arrive at  0915.  Labs Discussed Johnsie Cancel genetic screening with patient. Would like both Panorama and Horizon drawn at new OB visit. Routine prenatal labs needed.   Social Determinants of Health Food Insecurity: Patient denies food insecurity. WIC Referral: Patient is not interested in referral to [facility].  Transportation: Patient denies transportation needs.    Blenda Nicely, RN 03/08/2022  10:06 AM
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
## 2022-03-09 LAB — CERVICOVAGINAL ANCILLARY ONLY
Bacterial Vaginitis (gardnerella): NEGATIVE
Candida Glabrata: NEGATIVE
Candida Vaginitis: NEGATIVE
Chlamydia: NEGATIVE
Comment: NEGATIVE
Comment: NEGATIVE
Comment: NEGATIVE
Comment: NEGATIVE
Comment: NORMAL
Neisseria Gonorrhea: NEGATIVE

## 2022-03-10 LAB — URINE CULTURE

## 2022-03-11 ENCOUNTER — Encounter: Payer: Self-pay | Admitting: Family Medicine

## 2022-03-13 LAB — CYTOLOGY - PAP
Comment: NEGATIVE
Diagnosis: NEGATIVE
High risk HPV: NEGATIVE

## 2022-03-14 ENCOUNTER — Encounter (HOSPITAL_BASED_OUTPATIENT_CLINIC_OR_DEPARTMENT_OTHER): Payer: Commercial Managed Care - HMO

## 2022-03-14 ENCOUNTER — Encounter (HOSPITAL_BASED_OUTPATIENT_CLINIC_OR_DEPARTMENT_OTHER): Payer: Commercial Managed Care - HMO | Admitting: Obstetrics & Gynecology

## 2022-03-14 ENCOUNTER — Telehealth (HOSPITAL_BASED_OUTPATIENT_CLINIC_OR_DEPARTMENT_OTHER): Payer: Self-pay | Admitting: *Deleted

## 2022-03-14 NOTE — Telephone Encounter (Signed)
Called pt to request that she check her BP a couple times this week and that she bring her BP cuff with her when she comes on Friday for her labwork, as we want to check and make sure that the cuff is reading correctly. Pt verbalized understanding.

## 2022-03-15 ENCOUNTER — Telehealth (HOSPITAL_BASED_OUTPATIENT_CLINIC_OR_DEPARTMENT_OTHER): Payer: Self-pay | Admitting: Obstetrics & Gynecology

## 2022-03-15 NOTE — Telephone Encounter (Signed)
Patient called stated someone here call her.

## 2022-03-17 ENCOUNTER — Other Ambulatory Visit (HOSPITAL_BASED_OUTPATIENT_CLINIC_OR_DEPARTMENT_OTHER): Payer: Commercial Managed Care - HMO

## 2022-03-17 DIAGNOSIS — O0990 Supervision of high risk pregnancy, unspecified, unspecified trimester: Secondary | ICD-10-CM

## 2022-03-17 DIAGNOSIS — Z3401 Encounter for supervision of normal first pregnancy, first trimester: Secondary | ICD-10-CM

## 2022-03-18 LAB — CBC
Hematocrit: 36.1 % (ref 34.0–46.6)
Hemoglobin: 12.1 g/dL (ref 11.1–15.9)
MCH: 26.4 pg — ABNORMAL LOW (ref 26.6–33.0)
MCHC: 33.5 g/dL (ref 31.5–35.7)
MCV: 79 fL (ref 79–97)
Platelets: 337 10*3/uL (ref 150–450)
RBC: 4.59 x10E6/uL (ref 3.77–5.28)
RDW: 15.8 % — ABNORMAL HIGH (ref 11.7–15.4)
WBC: 8.4 10*3/uL (ref 3.4–10.8)

## 2022-03-18 LAB — HEPATITIS C ANTIBODY: Hep C Virus Ab: NONREACTIVE

## 2022-03-18 LAB — RPR: RPR Ser Ql: NONREACTIVE

## 2022-03-18 LAB — HEPATITIS B SURFACE ANTIGEN: Hepatitis B Surface Ag: NEGATIVE

## 2022-03-18 LAB — RUBELLA SCREEN: Rubella Antibodies, IGG: 1.69 index (ref >0.99)

## 2022-03-18 LAB — HIV ANTIBODY (ROUTINE TESTING W REFLEX): HIV Screen 4th Generation wRfx: NONREACTIVE

## 2022-03-21 ENCOUNTER — Encounter (HOSPITAL_BASED_OUTPATIENT_CLINIC_OR_DEPARTMENT_OTHER): Payer: 59 | Admitting: Advanced Practice Midwife

## 2022-03-21 ENCOUNTER — Encounter (HOSPITAL_BASED_OUTPATIENT_CLINIC_OR_DEPARTMENT_OTHER): Payer: Self-pay | Admitting: Obstetrics & Gynecology

## 2022-03-23 ENCOUNTER — Other Ambulatory Visit (HOSPITAL_BASED_OUTPATIENT_CLINIC_OR_DEPARTMENT_OTHER): Payer: Self-pay | Admitting: *Deleted

## 2022-03-23 LAB — PANORAMA PRENATAL TEST FULL PANEL:PANORAMA TEST PLUS 5 ADDITIONAL MICRODELETIONS: FETAL FRACTION: 10.2

## 2022-03-23 MED ORDER — COMPLETENATE 29-1 MG PO CHEW: 1.0000 | CHEWABLE_TABLET | Freq: Every day | ORAL | 11 refills | Status: DC

## 2022-03-23 NOTE — Progress Notes (Signed)
Rx sent to pharmacy for PNV per pt request. Informed pt that panorama results are back. Pt requests that we notify her sister Lacie Scotts of the gender, [phone]

## 2022-04-04 ENCOUNTER — Ambulatory Visit (INDEPENDENT_AMBULATORY_CARE_PROVIDER_SITE_OTHER): Payer: Commercial Managed Care - HMO | Admitting: Medical

## 2022-04-04 ENCOUNTER — Encounter (HOSPITAL_BASED_OUTPATIENT_CLINIC_OR_DEPARTMENT_OTHER): Payer: Self-pay | Admitting: Medical

## 2022-04-04 ENCOUNTER — Encounter (HOSPITAL_BASED_OUTPATIENT_CLINIC_OR_DEPARTMENT_OTHER): Payer: Self-pay | Admitting: *Deleted

## 2022-04-04 VITALS — BP 133/81 | HR 82 | Wt 185.4 lb

## 2022-04-04 DIAGNOSIS — Z8639 Personal history of other endocrine, nutritional and metabolic disease: Secondary | ICD-10-CM

## 2022-04-04 DIAGNOSIS — I4729 Other ventricular tachycardia: Secondary | ICD-10-CM

## 2022-04-04 DIAGNOSIS — E890 Postprocedural hypothyroidism: Secondary | ICD-10-CM

## 2022-04-04 DIAGNOSIS — D563 Thalassemia minor: Secondary | ICD-10-CM | POA: Insufficient documentation

## 2022-04-04 DIAGNOSIS — Z23 Encounter for immunization: Secondary | ICD-10-CM | POA: Diagnosis not present

## 2022-04-04 DIAGNOSIS — R7303 Prediabetes: Secondary | ICD-10-CM

## 2022-04-04 DIAGNOSIS — O09521 Supervision of elderly multigravida, first trimester: Secondary | ICD-10-CM

## 2022-04-04 DIAGNOSIS — O0990 Supervision of high risk pregnancy, unspecified, unspecified trimester: Secondary | ICD-10-CM

## 2022-04-04 MED ORDER — ASPIRIN 81 MG PO TBEC: 81.0000 mg | DELAYED_RELEASE_TABLET | Freq: Every day | ORAL | 12 refills | Status: DC

## 2022-04-04 NOTE — Progress Notes (Signed)
PRENATAL VISIT NOTE  Subjective:  [name] Curry is a 41 y.o. G1P0 at [redacted]w[redacted]d being seen today for ongoing prenatal care.  She is currently monitored for the following issues for this high-risk pregnancy and has Postablative hypothyroidism; Mass of breast; H/O Graves' disease; Family history of breast cancer in sister; BMI 36.0-36.9,adult; Pre-diabetes; Vitamin D deficiency; Mixed hyperlipidemia; Thalassemia trait, beta; Heart palpitations; NSVT (nonsustained ventricular tachycardia) ([place]); Muscle spasms of neck; Threatened miscarriage in early pregnancy; AMA (advanced maternal age) multigravida 27+, first trimester; Supervision of high risk pregnancy, antepartum; and Alpha thalassemia silent carrier on their problem list.  Patient reports backache.  Contractions: Not present. Vag. Bleeding: None.  Movement: Absent. Denies leaking of fluid.   The following portions of the patient's history were reviewed and updated as appropriate: allergies, current medications, past family history, past medical history, past social history, past surgical history and problem list.   Objective:   Vitals:   04/04/22 1351  BP: 133/81  Pulse: 82  Weight: 185 lb 6.4 oz (84.1 kg)    Fetal Status: Fetal Heart Rate (bpm): 157   Movement: Absent     General:  Alert, oriented and cooperative. Patient is in no acute distress.  Skin: Skin is warm and dry. No rash noted.   Cardiovascular: Normal heart rate noted  Respiratory: Normal respiratory effort, no problems with respiration noted  Abdomen: Soft, gravid, appropriate for gestational age.  Pain/Pressure: Absent     Pelvic: Cervical exam deferred        Extremities: Normal range of motion.  Edema: None  Mental Status: Normal mood and affect. Normal behavior. Normal judgment and thought content.   Assessment and Plan:  Pregnancy: G1P0 at [redacted]w[redacted]d 1. Supervision of high risk pregnancy, antepartum - Discussed need for 2 hour GTT before next visit given Hgb A1c  6.0 - Had flu shot today  - 05/23/21 [facility] at [facility]   2. AMA (advanced maternal age) multigravida 80+, first trimester - aspirin EC 81 MG tablet; Take 1 tablet (81 mg total) by mouth daily. Swallow whole.  Dispense: 30 tablet; Refill: 12  3. Alpha thalassemia silent carrier - Desires partner testing, RN will contact [employer] rep to reach out with information   4. NSVT (nonsustained ventricular tachycardia) ([place])  5. Postablative hypothyroidism - Followed by endocrinology, recently changed Sythroid   6. Pre-diabetes - aspirin EC 81 MG tablet; Take 1 tablet (81 mg total) by mouth daily. Swallow whole.  Dispense: 30 tablet; Refill: 12  7. H/O Graves' disease  Preterm labor symptoms and general obstetric precautions including but not limited to vaginal bleeding, contractions, leaking of fluid and fetal movement were reviewed in detail with the patient. Please refer to After Visit Summary for other counseling recommendations.   Return in about 4 weeks (around 05/02/2022) for [facility] APP, In-Person, as scheduled.  Future Appointments  Date Time Provider [place]  04/06/2022  3:10 PM Early, Coralee Pesa, NP DWB-DPC DWB  05/23/2022  9:15 AM WMC-MFC NURSE [facility]  05/23/2022  9:30 AM WMC-MFC US2 [facility]MFCUS [facility]  05/30/2022  8:35 AM Leftwich-Kirby, Kathie Dike, CNM DWB-OBGYN DWB  06/26/2022  3:35 PM Megan Salon, MD DWB-OBGYN DWB  07/03/2022  1:00 PM Philemon Kingdom, MD LBPC-LBENDO None  07/21/2022  8:35 AM Megan Salon, MD DWB-OBGYN DWB  08/09/2022  3:15 PM Megan Salon, MD DWB-OBGYN DWB  08/24/2022  3:15 PM Megan Salon, MD DWB-OBGYN DWB  09/06/2022  3:15 PM Megan Salon, MD DWB-OBGYN  DWB  09/21/2022  3:15 PM Jerene Bears, MD DWB-OBGYN DWB  09/28/2022  3:35 PM Jerene Bears, MD DWB-OBGYN DWB  10/04/2022  3:15 PM Jerene Bears, MD DWB-OBGYN DWB  10/12/2022  3:15 PM Jerene Bears, MD DWB-OBGYN DWB  10/19/2022  3:15 PM Jerene Bears, MD DWB-OBGYN DWB    Vonzella Nipple, PA-C

## 2022-04-04 NOTE — Patient Instructions (Addendum)
AREA PEDIATRIC/FAMILY PRACTICE PHYSICIANS  [place] ([address]) [facility] Chambliss, MD; Eniola, MD; Hale, MD; Hensel, MD; McDiarmid, MD; McIntyer, MD; Neal, MD; Walden, MD [address] [place], [place] [address] [phone] Mon-Fri 8:30-12:30, 1:30-5:00 Providers come to see babies at [facility] Accepting Medicaid [facility] providers who accept newborns: Koirala, MD; Morrow, MD; Wolters, MD [address], [place], [place] [address] [phone] Mon-Fri 8:00-5:30 Babies seen by providers at [facility] Does NOT accept Medicaid Please call early in hospitalization for appointment (limited availability)  [facility] Mulberry, MD [address] [place], [place] [address] [phone] Mon, Tue, Thur, Fri 8:30-5:00, Wed 10:00-7:00 (closed 1-2pm) Babies seen by [facility] providers Accepting Medicaid Rubin - Pediatrician Rubin, MD [address], [place], [place] [address] [phone] Mon-Fri 8:30-5:00, Sat 8:30-12:00 Provider comes to see babies at [facility] Accepting Medicaid Must have been referred from current patients or contacted office prior to delivery [facility] ([facility]) Brown, MD; Chandler, MD; Ettefagh, MD; Grant, MD; Lester, MD; McCormick, MD; McQueen, MD; Prose, MD; Simha, MD; Stanley, MD; Stryffeler, NP; Tebben, NP [address], [place], [place] [address] [phone] Mon, Tue, Thur, Fri 8:30-5:30, Wed 9:30-5:30, Sat 8:30-12:30 Babies seen by [facility] providers Accepting Medicaid Only accepting infants of first-time parents or siblings of current patients Hospital discharge coordinator will make follow-up appointment Jack Amos [address], [place], [place]  [address] [phone]   Fax - [phone] [facility] [address], [place], [place]  [address] Phone - [phone]   Fax - [phone] Shilpa Gosrani [address], [place], [place]  [address] [phone]  [place] ([address]) [facility] Bates, MD; Brassfield, MD; Cooper, Cox, MD; MD; Davis, MD; Dovico, MD; Ettefaugh, MD; Little, MD; Lowe, MD; Keiffer, MD; Melvin, MD; Sumner, MD; Williams, MD [address], [place], [place] [address] [phone] Mon-Fri 8:30-5:00 (extended evenings Mon-Thur as needed), Sat-Sun 10:00-1:00 Providers come to see babies at [facility] Accepting Medicaid for families of first-time babies and families with all children in the household age 3 and under. Must register with office prior to making appointment (M-F only). [facility] Henson, NP; Knapp, MD; Lalonde, MD; Tysinger, PA [address] [place], [place] [address] [phone] Mon-Fri 8:00-5:00 Babies seen by providers at [facility] Does NOT accept Medicaid/Commercial Insurance Only [facility] ([facility])  Artis, MD; Barnes, MD; Bratton, MD; Coccaro, MD; [place] Gardner, MD; Kramer, MD; Marshall, MD; Netherton, MD; Poleto, MD; Skinner, MD [address] [place], [place] [address] [phone] Mon-Fri 8:30-5:30, Sat (Oct.-Mar.) 9:00-1:00 Babies seen by providers at [facility] Accepting Medicaid  [place] ([address]) [facility] Reid, MD; Warner, MD [address], [place], [place] [address] [phone] Mon-Fri 8:30-5:00, Sat 8:30-12:00 Providers come to see babies at [facility] Does NOT accept Medicaid [facility] Becker, PA; Hagler, MD; Scifres, PA; Sun, MD; Swayne, MD [address], , [place] [address] [phone] Mon-Fri 8:00-5:00 Babies seen by providers at [facility] Does NOT accept Medicaid Only accepting babies of parents who  are patients Please call early in hospitalization for appointment (limited availability)  Pediatricians Clark, MD; Frye, MD; Kelleher, MD; Mack, NP; Miller, MD; O'Keller, MD; Patterson, NP; Pudlo, MD; Puzio, MD; Thomas, MD; Tucker, MD; Twiselton, MD [address], [place], [place] [address] [phone] Mon-Fri 8:00-5:00, Sat 9:00-12:00 Providers come to see babies at [facility] Does NOT accept Medicaid  [place] ([address]) [facility] providers accepting new patients: Brake, NP; Wharton, PA [address], [place], [place] [address] [phone] Mon-Fri 8:00-5:00 Babies seen by providers at [facility] Does NOT accept Medicaid Only accepting babies of parents who are patients Please call early in hospitalization for appointment (limited availability) [facility] Gay, MD; Quinlan, MD [address] [place], [place] [address] [phone] (press 1 to schedule appointment) Mon-Fri 8:00-5:00 Providers come to see babies at [facility] Does NOT accept Medicaid [facility] Mazer, MD [address] [place], [place] [address] [phone] Mon-Fri 8:30-5:00 (lunch 12:30-1:00), extended hours by appointment only Wed 5:00-6:30 Babies seen by [facility] providers Accepting Medicaid [facility] Banks, MD; Jordan, MD; Koberlein, MD [address], [place], [place] [address] [phone] Mon-Fri 8:00-5:00 Babies seen by [facility] providers Does NOT accept Medicaid [facility] Parker, MD; Hunter, MD; Wallace, DO [address] [place], [place] [address] [phone] Mon-Fri 8:00-5:00 Babies seen by [facility] providers Does NOT accept Medicaid [facility] Brandon, PA; Brecken, PA; Christy, NP; Dees, MD; DeClaire, MD; DeWeese, MD; Hansen, NP; Mills, NP; Parrish, NP; Smoot, NP; Summer, MD; Vapne,  MD [address] [place], [place] [address] [phone] Mon-Fri 8:30-5:00, Sat 10:00-1:00 Providers come to see babies at [facility] Does NOT accept Medicaid Free prenatal information session Tuesdays at 4:45pm [facility] Bouska, MD; Gordon, PA; Jeffery, PA; Weber, PA [address] [place] [address] [phone] Mon-Fri 7:30-5:30 Babies seen by [facility] providers [facility] [address], [place], [place]  [address] [phone]   Fax - [phone]  [place] ([address] & [address]) [facility] [place] [address] [place], [place] [address] [phone] Mon-Thur 8:00-6:00 Providers come to see babies at [facility] Accepting Medicaid [facility] Anderson, NP; Badger, MD; Beal, PA; Spencer, PA [address] [place], [place] [address] [phone] Mon-Thur 7:30-7:30, Fri 7:30-4:30 Babies seen by [facility] providers Accepting Medicaid [facility] Agbuya, MD; Klett, NP; Romgoolam, MD [address], [place], [place] [address] [phone] Mon-Fri 8:30-5:00, Sat 8:30-12:00 Providers come to see babies at [facility] Accepting Medicaid Must have "Meet & Greet" appointment at office prior to delivery [facility] ([facility]) McCord, MD; Wallace, MD; Wood, MD [address], [place], [place] [address] [phone] Mon-Wed 8:00-6:00, Thur-Fri 8:00-5:00, Sat 9:00-12:00 Providers come to see babies at [facility] Does NOT accept Medicaid Only accepting siblings of current patients [facility]  [address], [place], [place]  [address] [phone]   Fax - [phone] [facility] [address], [place], [place]  [address] [phone]   Fax -  [phone]  [place] ([address] & [address]) [facility] Cirigliano, DO; Matthews, DO [address] [place], [place] [address] [phone] Mon-Fri 7:00-5:00 Babies seen by [facility] providers Does NOT accept Medicaid [facility] Briscoe, MD; Howley, PA; Moreira, PA [address], [place],  [address] [phone] Mon-Fri 8:00-5:00 Babies seen by [facility] providers Accepting Medicaid [facility] Boyd, MD; Church, PA; Bellamy, NP; Osborn, PA [address], ,  [address] [phone] Mon-Fri 8:00-5:00 Babies seen by providers at [facility] Accepting Medicaid  [place] ([address]) [facility] [place] Wendling, DO [address] [place], [place] [address] [phone] Mon-Fri 8:00-5:00 Babies seen by [facility] providers Does NOT accept Medicaid Limited availability, please call early in hospitalization to schedule follow-up [facility] Calderon, PA; Cummings, MD; Dillard, MD; Martin, PA; Olson, MD; VanDeven, PA [address], [place], [place] [address] [phone] Mon-Fri 8:30-5:00, Sat 9:00-12:00 Babies seen by providers at [facility] Accepting Medicaid Please register online then schedule online or call office [email or web address] Hunter, NP; Kumar, MD; Martin Rogers, PA [address], [place], [place] [address] [phone] Mon-Fri 8:00-5:00 Babies seen by providers at [facility] Accepting Medicaid [facility] ([facility]) Waldwick, MD; Kristi Fleenor, NP; West, MD [address], [place], [place] [address] [phone] Mon-Fri 8:00-5:30, Sat&Sun by appointment (phones open at  8:30) Babies seen by [facility] providers Accepting Medicaid Must be a first-time baby or sibling of current patient [facility] [place]  [address], [place], [place]  [address] [phone]   Fax - [phone]  [place] ([address]) [facility] Brown, PA; Cowen, PA; Rice, MD; Helton, PA; Spry, MD [address] [place], [place] [address] [phone] Mon-Thur 8:00-7:00, Fri 8:00-5:00, Sat 8:00-12:00, Sun 9:00-12:00 Babies seen by [facility] providers Accepting Medicaid [facility] Coe-Goins, MD; Marshall, MD; Pierre-Louis, MD [address] [place], [place], [place] [address] [phone] Mon-Thur 8:00-5:00 Babies seen by providers at [facility] Accepting Medicaid [facility] Bratton, MD; Coe-Goins, MD; Hayes, MD; Lewis, MD; List, MD; Lott, MD; Marshall, MD; Moran, MD; O'Neal, MD; Pierre-Louis, MD; Pitonzo, MD; Scholer, MD; Spangle, MD [address] [place], [place] [address] [phone] Mon-Fri 8:00-5:30, Sat (Oct.-Mar.) 9:00-1:00 Babies seen by providers at [facility] Accepting Medicaid Must fill out new patient packet, available online at [email or web address] [facility] ([facility]) Friddle, NP; Harris, NP; Kelly, NP; Logan, MD; Melvin, PA; Poth, MD; Ramadoss, MD; [place], NP [address], [place], [place] [address] [phone] Mon-Thur 8:00-5:30, Fri 8:00-5:00 Babies seen by providers at [facility] Accepting Medicaid  [place] ([address]) [facility] Dixon, PA; Abernathy, MD; Pickard, MD; Tapia, PA [address], [place], [place] [address] [phone] Mon-Fri 8:00-5:00 Babies seen by providers at [facility] Accepting Medicaid   [place] ([address]) [facility] [place] Masneri, DO; Meyers, MD; [place], PA [address], [place], [place] [address] [phone] Mon-Fri 8:00-5:00 Babies seen by providers at [facility] Does NOT accept Medicaid Limited appointment availability, please call early in hospitalization   [facility] [place] Kunedd, DO; McGowen, MD [address], [place], [place] [address] [phone] Mon-Fri 8:00-5:00 Babies seen by [facility] providers Does NOT accept Medicaid [facility] [place] Cameron, MD; MacDonald, MD; Michaels, PA; Nayak, MD [address] [place] [address], [place], [place] [address] [phone] Mon-Fri 8:00-5:00 After hours clinic ([address] [place],  [address]) [phone] Mon-Fri 5:00-8:00, Sat 12:00-6:00, Sun 10:00-4:00 Babies seen by [facility] providers Accepting Medicaid [facility] [place] [address], [place], [place]  [address] [phone]   Fax - [phone]  [place] ([address]) [facility] [place] [facility] Andy, MD [address], [place], [place] [address] [phone] Mon-Fri 8:00-5:00 Babies seen by [facility] providers Does NOT accept Medicaid [facility] [place] ([facility] [place]) Eksir, MD [address], [place], [place] [address] [phone] Mon-Thur 8:00-7:00, Fri 8:00-5:00, Sat 8:00-12:00 Babies seen by providers at [facility] Accepting Medicaid - but does not have vaccinations in office (must be received elsewhere) Limited availability, please call early in hospitalization  [place] ([address]) [facility]  Charlene Flemming, MD [address], [place] [address] [phone]  Fax [phone]  [facility]  Kimberly Newton, MD, Annamarie Streilein, PA, Carla [place], PA [address] [place], [place]  [address] [phone] [facility]  [address], [place], [place] [address] [phone] [address], [place], [place] [address] [phone] (West Office)  [facility] [address], [place], [place] [address] [phone] [facility] [address], [place], [place] [address] [phone] [facility] [address], [place], [place] [address] [phone] [facility] [address], [place], [place] [address] [phone] [facility] [address], [place], [place] [address] [phone] [facility] [address], [place], [place] [address] [phone] [facility]  [address], [place], [place] [address] [phone] [facility] [address], [place], [place] [address] [phone] [facility] [address], [place], [place] [address] [phone] [facility] [address], [place],  [address] [phone]  

## 2022-04-06 ENCOUNTER — Ambulatory Visit (INDEPENDENT_AMBULATORY_CARE_PROVIDER_SITE_OTHER): Payer: Commercial Managed Care - HMO | Admitting: Nurse Practitioner

## 2022-04-06 VITALS — BP 122/55 | HR 81 | Temp 98.1°F | Ht 64.0 in | Wt 181.7 lb

## 2022-04-06 DIAGNOSIS — F17211 Nicotine dependence, cigarettes, in remission: Secondary | ICD-10-CM | POA: Diagnosis not present

## 2022-04-06 DIAGNOSIS — Z3A16 16 weeks gestation of pregnancy: Secondary | ICD-10-CM | POA: Diagnosis not present

## 2022-04-06 NOTE — Patient Instructions (Addendum)
I am SO HAPPY FOR YOU!!!!  My new office is  [facility] [address] [place], [place]  [phone]

## 2022-04-06 NOTE — Progress Notes (Signed)
[name] Eth, DNP, AGNP-c [facility] [address] [place], [place] [address] [phone]  Subjective:   [name] Curry is a 41 y.o. female presents to day for evaluation of: New Pregnancy [name] has come to discuss her pregnancy. She is due Oct 11, 2022 and has found that she is having a daughter. She is very excited about the pregnancy and is feeling very well. She has not had morning sickness and is eating a healthy diet. No vaginal bleeding present. She has felt a flutter or two of movement but nothing significant yet. She has quit smoking. Her GYN has altered her levothyroxine dosage and is monitoring this closely. She would like to follow me to my new location.    PMH, Medications, and Allergies reviewed and updated in chart as appropriate.   ROS negative except for what is listed in HPI. Objective:  BP (!) 122/55 (BP Location: Left Arm, Patient Position: Sitting)   Pulse 81   Temp 98.1 F (36.7 C)   Ht 5\' 4"  (1.626 m)   Wt 181 lb 11.2 oz (82.4 kg)   LMP 12/30/2021   SpO2 100%   BMI 31.19 kg/m  Physical Exam Vitals and nursing note reviewed.  Constitutional:      General: She is not in acute distress.    Appearance: Normal appearance.  HENT:     Head: Normocephalic.  Eyes:     Extraocular Movements: Extraocular movements intact.     Conjunctiva/sclera: Conjunctivae normal.     Pupils: Pupils are equal, round, and reactive to light.  Neck:     Vascular: No carotid bruit.  Cardiovascular:     Rate and Rhythm: Normal rate and regular rhythm.     Pulses: Normal pulses.     Heart sounds: Normal heart sounds. No murmur heard. Pulmonary:     Effort: Pulmonary effort is normal.     Breath sounds: Normal breath sounds. No wheezing.  Abdominal:     General: Bowel sounds are normal. There is no distension.     Palpations: Abdomen is soft.     Tenderness: There is no abdominal tenderness. There is no guarding.  Musculoskeletal:        General: Normal  range of motion.     Cervical back: Normal range of motion and neck supple.     Right lower leg: No edema.     Left lower leg: No edema.  Lymphadenopathy:     Cervical: No cervical adenopathy.  Skin:    General: Skin is warm and dry.     Capillary Refill: Capillary refill takes less than 2 seconds.  Neurological:     General: No focal deficit present.     Mental Status: She is alert and oriented to person, place, and time.  Psychiatric:        Mood and Affect: Mood normal.        Behavior: Behavior normal.        Thought Content: Thought content normal.        Judgment: Judgment normal.           Assessment & Plan:   Problem List Items Addressed This Visit     [redacted] weeks gestation of pregnancy - Primary    She is doing very well in her pregnancy so far and her vital signs look great today. She is eating well and resting well at this time. There are no symptoms present that pose alarm. Patient praised for smoking cessation. I am very  excited for her and her husband. Recommend continue to follow with GYN and I will provide supportive care as needed. She may follow-up as needed.          [name] Eth, DNP, AGNP-c 05/21/2022  9:16 PM    History, Medications, Surgery, SDOH, and Family History reviewed and updated as appropriate.

## 2022-04-10 ENCOUNTER — Encounter (HOSPITAL_BASED_OUTPATIENT_CLINIC_OR_DEPARTMENT_OTHER): Payer: Self-pay

## 2022-04-20 ENCOUNTER — Telehealth (HOSPITAL_BASED_OUTPATIENT_CLINIC_OR_DEPARTMENT_OTHER): Payer: Self-pay | Admitting: Obstetrics & Gynecology

## 2022-04-20 NOTE — Telephone Encounter (Signed)
Patient called and would like for the nurse to please call her.Per patient she is having muscle cramps.

## 2022-04-20 NOTE — Telephone Encounter (Signed)
Pt states that she is having some abdominal discomfort that feels like a pulled muscle. She has not tried any comfort measures. Advised that she could try heat for 20 min on and 20 min off as needed, she could try a belly band, and she can take Tylenol. Pt will try these things and let us know if her symptoms do not improve.

## 2022-04-21 DIAGNOSIS — Z419 Encounter for procedure for purposes other than remedying health state, unspecified: Secondary | ICD-10-CM | POA: Diagnosis not present

## 2022-04-24 ENCOUNTER — Ambulatory Visit (HOSPITAL_BASED_OUTPATIENT_CLINIC_OR_DEPARTMENT_OTHER): Payer: 59 | Admitting: Nurse Practitioner

## 2022-04-26 ENCOUNTER — Inpatient Hospital Stay (HOSPITAL_COMMUNITY)
Admission: AD | Admit: 2022-04-26 | Discharge: 2022-04-26 | Disposition: A | Discharge status: Home / Self Care | Payer: Commercial Managed Care - HMO | Attending: Obstetrics & Gynecology | Admitting: Obstetrics & Gynecology

## 2022-04-26 DIAGNOSIS — D563 Thalassemia minor: Secondary | ICD-10-CM | POA: Insufficient documentation

## 2022-04-26 DIAGNOSIS — Z3A15 15 weeks gestation of pregnancy: Secondary | ICD-10-CM | POA: Insufficient documentation

## 2022-04-26 DIAGNOSIS — O200 Threatened abortion: Secondary | ICD-10-CM | POA: Diagnosis not present

## 2022-04-26 DIAGNOSIS — O09521 Supervision of elderly multigravida, first trimester: Secondary | ICD-10-CM | POA: Diagnosis not present

## 2022-04-26 DIAGNOSIS — R102 Pelvic and perineal pain: Secondary | ICD-10-CM | POA: Diagnosis not present

## 2022-04-26 DIAGNOSIS — N949 Unspecified condition associated with female genital organs and menstrual cycle: Secondary | ICD-10-CM | POA: Diagnosis not present

## 2022-04-26 DIAGNOSIS — O26892 Other specified pregnancy related conditions, second trimester: Secondary | ICD-10-CM | POA: Insufficient documentation

## 2022-04-26 DIAGNOSIS — O0990 Supervision of high risk pregnancy, unspecified, unspecified trimester: Secondary | ICD-10-CM

## 2022-04-26 DIAGNOSIS — R1031 Right lower quadrant pain: Secondary | ICD-10-CM | POA: Insufficient documentation

## 2022-04-26 DIAGNOSIS — O0991 Supervision of high risk pregnancy, unspecified, first trimester: Secondary | ICD-10-CM | POA: Insufficient documentation

## 2022-04-26 LAB — CBC WITH DIFFERENTIAL/PLATELET
Abs Immature Granulocytes: 0.26 10*3/uL — ABNORMAL HIGH (ref 0.00–0.07)
Basophils Absolute: 0.1 10*3/uL (ref 0.0–0.1)
Basophils Relative: 1 %
Eosinophils Absolute: 0.2 10*3/uL (ref 0.0–0.5)
Eosinophils Relative: 1 %
HCT: 34.2 % — ABNORMAL LOW (ref 36.0–46.0)
Hemoglobin: 11.5 g/dL — ABNORMAL LOW (ref 12.0–15.0)
Immature Granulocytes: 2 %
Lymphocytes Relative: 28 %
Lymphs Abs: 3.3 10*3/uL (ref 0.7–4.0)
MCH: 26.3 pg (ref 26.0–34.0)
MCHC: 33.6 g/dL (ref 30.0–36.0)
MCV: 78.1 fL — ABNORMAL LOW (ref 80.0–100.0)
Monocytes Absolute: 0.9 10*3/uL (ref 0.1–1.0)
Monocytes Relative: 7 %
Neutro Abs: 7.2 10*3/uL (ref 1.7–7.7)
Neutrophils Relative %: 61 %
Platelets: 316 10*3/uL (ref 150–400)
RBC: 4.38 MIL/uL (ref 3.87–5.11)
RDW: 14.4 % (ref 11.5–15.5)
WBC: 11.9 10*3/uL — ABNORMAL HIGH (ref 4.0–10.5)
nRBC: 0 % (ref 0.0–0.2)

## 2022-04-26 LAB — COMPREHENSIVE METABOLIC PANEL
ALT: 25 U/L (ref 0–44)
AST: 23 U/L (ref 15–41)
Albumin: 3.2 g/dL — ABNORMAL LOW (ref 3.5–5.0)
Alkaline Phosphatase: 52 U/L (ref 38–126)
Anion gap: 8 (ref 5–15)
BUN: 8 mg/dL (ref 6–20)
CO2: 25 mmol/L (ref 22–32)
Calcium: 9.6 mg/dL (ref 8.9–10.3)
Chloride: 105 mmol/L (ref 98–111)
Creatinine, Ser: 0.81 mg/dL (ref 0.44–1.00)
GFR, Estimated: >60 mL/min (ref >60)
Glucose, Bld: 100 mg/dL — ABNORMAL HIGH (ref 70–99)
Potassium: 3.8 mmol/L (ref 3.5–5.1)
Sodium: 138 mmol/L (ref 135–145)
Total Bilirubin: 0.3 mg/dL (ref 0.3–1.2)
Total Protein: 6.6 g/dL (ref 6.5–8.1)

## 2022-04-26 LAB — URINALYSIS, ROUTINE W REFLEX MICROSCOPIC
Bilirubin Urine: NEGATIVE
Glucose, UA: NEGATIVE mg/dL
Hgb urine dipstick: NEGATIVE
Ketones, ur: 5 mg/dL — AB
Leukocytes,Ua: NEGATIVE
Nitrite: NEGATIVE
Protein, ur: NEGATIVE mg/dL
Specific Gravity, Urine: 1.032 — ABNORMAL HIGH (ref 1.005–1.030)
pH: 5 (ref 5.0–8.0)
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
## 2022-04-26 NOTE — MAU Note (Signed)
[name] Curry is a 41 y.o. at [redacted]w[redacted]d here in MAU reporting: cramping on RLQ, been going on all day.  Thought it was gas at first, but it hasn't gone away, got kind of severe. No bleeding. No fever. Denies diarrhea or constipation. Some pressure with urination. Worse when walking or going up stairs. Still has appendix  Onset of complaint: when she woke up  Pain score: 3, was an 8 when she got here Vitals:   04/26/22 1723  BP: 134/76  Pulse: 85  Resp: 18  Temp: 99.5 F (37.5 C)  SpO2: 99%     FHT:162 Lab orders placed from triage:  urine   Abd soft.  Feels pressure on palpation in RLQ, no pain, no rebound.

## 2022-04-27 ENCOUNTER — Other Ambulatory Visit (HOSPITAL_BASED_OUTPATIENT_CLINIC_OR_DEPARTMENT_OTHER): Payer: Self-pay | Admitting: *Deleted

## 2022-04-27 ENCOUNTER — Encounter (HOSPITAL_BASED_OUTPATIENT_CLINIC_OR_DEPARTMENT_OTHER): Payer: Self-pay | Admitting: Obstetrics & Gynecology

## 2022-04-27 MED ORDER — PRENATAL VITAMIN PLUS LOW IRON 27-1 MG PO TABS: 1.0000 | ORAL_TABLET | Freq: Every day | ORAL | 11 refills | Status: DC

## 2022-05-02 ENCOUNTER — Encounter (HOSPITAL_BASED_OUTPATIENT_CLINIC_OR_DEPARTMENT_OTHER): Payer: Self-pay

## 2022-05-02 ENCOUNTER — Encounter (HOSPITAL_BASED_OUTPATIENT_CLINIC_OR_DEPARTMENT_OTHER): Payer: Commercial Managed Care - HMO | Admitting: Advanced Practice Midwife

## 2022-05-04 ENCOUNTER — Ambulatory Visit (INDEPENDENT_AMBULATORY_CARE_PROVIDER_SITE_OTHER): Payer: Commercial Managed Care - HMO | Admitting: Medical

## 2022-05-04 ENCOUNTER — Encounter (HOSPITAL_BASED_OUTPATIENT_CLINIC_OR_DEPARTMENT_OTHER): Payer: Self-pay

## 2022-05-04 ENCOUNTER — Encounter: Payer: Self-pay | Admitting: Medical

## 2022-05-04 VITALS — BP 125/68 | HR 81 | Wt 194.6 lb

## 2022-05-04 DIAGNOSIS — E782 Mixed hyperlipidemia: Secondary | ICD-10-CM

## 2022-05-04 DIAGNOSIS — R7303 Prediabetes: Secondary | ICD-10-CM

## 2022-05-04 DIAGNOSIS — O0992 Supervision of high risk pregnancy, unspecified, second trimester: Secondary | ICD-10-CM

## 2022-05-04 DIAGNOSIS — O09521 Supervision of elderly multigravida, first trimester: Secondary | ICD-10-CM

## 2022-05-04 DIAGNOSIS — Z8639 Personal history of other endocrine, nutritional and metabolic disease: Secondary | ICD-10-CM

## 2022-05-04 DIAGNOSIS — D563 Thalassemia minor: Secondary | ICD-10-CM

## 2022-05-04 DIAGNOSIS — I4729 Other ventricular tachycardia: Secondary | ICD-10-CM

## 2022-05-04 DIAGNOSIS — Z3A17 17 weeks gestation of pregnancy: Secondary | ICD-10-CM

## 2022-05-04 DIAGNOSIS — E890 Postprocedural hypothyroidism: Secondary | ICD-10-CM

## 2022-05-04 DIAGNOSIS — O0990 Supervision of high risk pregnancy, unspecified, unspecified trimester: Secondary | ICD-10-CM

## 2022-05-04 NOTE — Progress Notes (Signed)
PRENATAL VISIT NOTE  Subjective:  [name] Curry is a 41 y.o. G2P0 at [redacted]w[redacted]d being seen today for ongoing prenatal care.  She is currently monitored for the following issues for this high-risk pregnancy and has Postablative hypothyroidism; Mass of breast; H/O Graves' disease; Family history of breast cancer in sister; BMI 36.0-36.9,adult; Pre-diabetes; Vitamin D deficiency; Mixed hyperlipidemia; Thalassemia trait, beta; Heart palpitations; NSVT (nonsustained ventricular tachycardia) (HCC); Muscle spasms of neck; AMA (advanced maternal age) multigravida 35+, first trimester; Supervision of high risk pregnancy, antepartum; and Alpha thalassemia silent carrier on their problem list.  Patient reports no complaints.  Contractions: Not present. Vag. Bleeding: None.  Movement: Present. Denies leaking of fluid.   The following portions of the patient's history were reviewed and updated as appropriate: allergies, current medications, past family history, past medical history, past social history, past surgical history and problem list.   Objective:   Vitals:   05/04/22 0948  BP: 125/68  Pulse: 81  Weight: 194 lb 9.6 oz (88.3 kg)    Fetal Status: Fetal Heart Rate (bpm): 154   Movement: Present     General:  Alert, oriented and cooperative. Patient is in no acute distress.  Skin: Skin is warm and dry. No rash noted.   Cardiovascular: Normal heart rate noted  Respiratory: Normal respiratory effort, no problems with respiration noted  Abdomen: Soft, gravid, appropriate for gestational age.  Pain/Pressure: Absent     Pelvic: Cervical exam deferred        Extremities: Normal range of motion.  Edema: None  Mental Status: Normal mood and affect. Normal behavior. Normal judgment and thought content.   Assessment and Plan:  Pregnancy: G2P0 at [redacted]w[redacted]d 1. Supervision of high risk pregnancy in second trimester - AFP, Serum, Open Spina Bifida - Anatomy US scheduled 05/23/22 - Dicussed safe exercise in  pregnancy  - taking PNV with iron now without side effects   2. NSVT (nonsustained ventricular tachycardia) (HCC)  4. Thalassemia trait, beta - Partner test brought in today, will send   5. Pre-diabetes - Glucose Tolerance, 2 Hours w/1 Hour - ASA   6. Postablative hypothyroidism - Sees endocrine, next appt January   7. Mixed hyperlipidemia  8. H/O Graves' disease  9. Alpha thalassemia silent carrier  10. AMA (advanced maternal age) multigravida 35+, first trimester - < 36 yo, no change  11. [redacted] weeks gestation of pregnancy  Preterm labor symptoms and general obstetric precautions including but not limited to vaginal bleeding, contractions, leaking of fluid and fetal movement were reviewed in detail with the patient. Please refer to After Visit Summary for other counseling recommendations.   Return in about 4 weeks (around 06/01/2022) for LOB, as scheduled.  Future Appointments  Date Time Provider Department Center  05/23/2022  9:15 AM [facility] NURSE [facility]  05/23/2022  9:30 AM [facility] US2 [facility]  05/30/2022  8:35 AM Leftwich-Kirby, Wilmer Floor, CNM [facility]  06/30/2022 10:35 AM Jerene Bears, MD [facility]  07/03/2022  1:00 PM Carlus Pavlov, MD [facility] None  07/27/2022 11:15 AM Jerene Bears, MD [facility]  08/11/2022 10:55 AM Jerene Bears, MD [facility]  08/25/2022  9:35 AM Jerene Bears, MD [facility]  09/08/2022  9:35 AM Jerene Bears, MD [facility]  09/15/2022  9:55 AM Jerene Bears, MD [facility]  09/22/2022 10:35 AM Jerene Bears, MD [facility]  09/29/2022 10:15 AM Jerene Bears, MD [facility]    Vonzella Nipple, PA-C

## 2022-05-05 LAB — GLUCOSE TOLERANCE, 2 HOURS W/ 1HR
Glucose, 1 hour: 116 mg/dL (ref 70–179)
Glucose, 2 hour: 75 mg/dL (ref 70–152)
Glucose, Fasting: 79 mg/dL (ref 70–91)

## 2022-05-06 LAB — AFP, SERUM, OPEN SPINA BIFIDA
AFP MoM: 1.11
AFP Value: 43.4 ng/mL
Gest. Age on Collection Date: 17.6 weeks
Maternal Age At EDD: 39.4 yr
OSBR Risk 1 IN: 10000
Test Results:: NEGATIVE
Weight: 194 [lb_av]

## 2022-05-07 ENCOUNTER — Encounter (HOSPITAL_BASED_OUTPATIENT_CLINIC_OR_DEPARTMENT_OTHER): Payer: Self-pay | Admitting: Emergency Medicine

## 2022-05-07 ENCOUNTER — Emergency Department (HOSPITAL_BASED_OUTPATIENT_CLINIC_OR_DEPARTMENT_OTHER)
Admission: EM | Admit: 2022-05-07 | Discharge: 2022-05-07 | Disposition: A | Discharge status: Home / Self Care | Payer: Commercial Managed Care - HMO | Attending: Emergency Medicine | Admitting: Emergency Medicine

## 2022-05-07 ENCOUNTER — Other Ambulatory Visit: Payer: Self-pay

## 2022-05-07 DIAGNOSIS — J45909 Unspecified asthma, uncomplicated: Secondary | ICD-10-CM | POA: Insufficient documentation

## 2022-05-07 DIAGNOSIS — Z3A16 16 weeks gestation of pregnancy: Secondary | ICD-10-CM | POA: Diagnosis not present

## 2022-05-07 DIAGNOSIS — Z1152 Encounter for screening for COVID-19: Secondary | ICD-10-CM | POA: Insufficient documentation

## 2022-05-07 DIAGNOSIS — B974 Respiratory syncytial virus as the cause of diseases classified elsewhere: Secondary | ICD-10-CM | POA: Insufficient documentation

## 2022-05-07 DIAGNOSIS — F319 Bipolar disorder, unspecified: Secondary | ICD-10-CM

## 2022-05-07 DIAGNOSIS — J069 Acute upper respiratory infection, unspecified: Secondary | ICD-10-CM | POA: Diagnosis not present

## 2022-05-07 DIAGNOSIS — O99512 Diseases of the respiratory system complicating pregnancy, second trimester: Secondary | ICD-10-CM | POA: Diagnosis not present

## 2022-05-07 DIAGNOSIS — Z3A01 Less than 8 weeks gestation of pregnancy: Secondary | ICD-10-CM | POA: Diagnosis not present

## 2022-05-07 DIAGNOSIS — E039 Hypothyroidism, unspecified: Secondary | ICD-10-CM | POA: Insufficient documentation

## 2022-05-07 DIAGNOSIS — O09892 Supervision of other high risk pregnancies, second trimester: Secondary | ICD-10-CM | POA: Insufficient documentation

## 2022-05-07 DIAGNOSIS — O99282 Endocrine, nutritional and metabolic diseases complicating pregnancy, second trimester: Secondary | ICD-10-CM | POA: Insufficient documentation

## 2022-05-07 DIAGNOSIS — B338 Other specified viral diseases: Secondary | ICD-10-CM

## 2022-05-07 DIAGNOSIS — O99511 Diseases of the respiratory system complicating pregnancy, first trimester: Secondary | ICD-10-CM | POA: Diagnosis not present

## 2022-05-07 DIAGNOSIS — J029 Acute pharyngitis, unspecified: Secondary | ICD-10-CM | POA: Diagnosis not present

## 2022-05-07 LAB — RESP PANEL BY RT-PCR (RSV, FLU A&B, COVID)  RVPGX2
Influenza A by PCR: NEGATIVE
Influenza B by PCR: NEGATIVE
Resp Syncytial Virus by PCR: POSITIVE — AB
SARS Coronavirus 2 by RT PCR: NEGATIVE

## 2022-05-07 LAB — GROUP A STREP BY PCR: Group A Strep by PCR: NOT DETECTED

## 2022-05-07 NOTE — ED Provider Notes (Signed)
[facility] EMERGENCY DEPT Provider Note   CSN: [id number] Arrival date & time: 05/07/22  0350     History  Chief Complaint  Patient presents with   Sore Throat    [name] Curry is a 41 y.o. female.  With past medical history of asthma, beta thalassemia trait, hypothyroid, currently 4 months pregnant who presents to the emergency department with chills and sore throat.  Patient states that symptoms began on Thursday with chills.  She states that then over Friday and Saturday she began having sore throat and feeling like she was "swallowing glass."  She states that she had temperature of 99 yesterday at home and took Tylenol with improvement in symptoms.  She has been able to tolerate p.o.  She also complains of dry cough.  She denies any chest pain, shortness of breath, nausea, vomiting or diarrhea.  She denies any abdominal cramping or vaginal bleeding.   Sore Throat       Home Medications Prior to Admission medications   Medication Sig Start Date End Date Taking? Authorizing Provider  aspirin EC 81 MG tablet Take 1 tablet (81 mg total) by mouth daily. Swallow whole. 04/04/22   Marny Lowenstein, PA-C  levothyroxine (SYNTHROID) 88 MCG tablet Take 1 tablet (88 mcg total) by mouth daily. Except 2 tablets 2x a week. 02/27/22   Carlus Pavlov, MD  Prenatal Vit-Fe Fumarate-FA (PRENATAL VITAMIN PLUS LOW IRON) 27-1 MG TABS Take 1 tablet by mouth daily. 04/27/22   Jerene Bears, MD      Allergies    Atenolol, Methimazole, Penicillins, and Sulfa antibiotics    Review of Systems   Review of Systems  Constitutional:  Positive for fever.  HENT:  Positive for sore throat.   Respiratory:  Positive for cough.   All other systems reviewed and are negative.   Physical Exam Updated Vital Signs BP 121/79 (BP Location: Right Arm)   Pulse 90   Temp 98.6 F (37 C)   Resp 16   LMP 12/30/2021   SpO2 100%  Physical Exam Vitals and nursing note reviewed.   Constitutional:      General: She is not in acute distress.    Appearance: She is well-developed. She is not ill-appearing or toxic-appearing.  HENT:     Head: Normocephalic and atraumatic.     Nose: Congestion present.     Mouth/Throat:     Mouth: Mucous membranes are moist.     Pharynx: Oropharynx is clear. Posterior oropharyngeal erythema present.     Tonsils: No tonsillar exudate or tonsillar abscesses.  Eyes:     General: No scleral icterus.    Conjunctiva/sclera: Conjunctivae normal.  Cardiovascular:     Rate and Rhythm: Normal rate and regular rhythm.     Heart sounds: Normal heart sounds. No murmur heard. Pulmonary:     Effort: Pulmonary effort is normal. No respiratory distress.     Breath sounds: Normal breath sounds. No wheezing, rhonchi or rales.  Abdominal:     Comments: Gravid abdomen that is nontender  Skin:    General: Skin is warm and dry.     Capillary Refill: Capillary refill takes less than 2 seconds.     Findings: No rash.  Neurological:     General: No focal deficit present.     Mental Status: She is alert and oriented to person, place, and time.  Psychiatric:        Mood and Affect: Mood normal.  Behavior: Behavior normal.        Thought Content: Thought content normal.        Judgment: Judgment normal.     ED Results / Procedures / Treatments   Labs (all labs ordered are listed, but only abnormal results are displayed) Labs Reviewed  RESP PANEL BY RT-PCR (RSV, FLU A&B, COVID)  RVPGX2 - Abnormal; Notable for the following components:      Result Value   Resp Syncytial Virus by PCR POSITIVE (*)    All other components within normal limits  GROUP A STREP BY PCR    EKG None  Radiology No results found.  Procedures Procedures   Medications Ordered in ED Medications - No data to display  ED Course/ Medical Decision Making/ A&P                           Medical Decision Making Initial Impression and Ddx 41 year old female who  presents to the emergency department with cough and sore throat.  She is overall well-appearing, nonseptic and nontoxic in appearance.  She does not have fever here in the emergency department.  Obtaining COVID, flu, RSV and strep. Patient PMH that increases complexity of ED encounter: Asthma  Interpretation of Diagnostics I independent reviewed and interpreted the labs as followed: RSV positive  - I independently visualized the following imaging with scope of interpretation limited to determining acute life threatening conditions related to emergency care: Not indicated   Patient Reassessment and Ultimate Disposition/Management She is well-appearing.  Her lungs are clear bilaterally without evidence of pneumonia.  Do not feel that she needs chest x-ray at this time.  Her respiratory panel came back positive for RSV. Given that she is pregnant can use plain Mucinex, Tylenol, Benadryl.  Do not advise decongestants or other medications at this time.  Continue drink plenty of fluids. Given return precautions for worsening symptoms.  Otherwise following up with PCP. She verbalized understanding.  The patient has been appropriately medically screened and/or stabilized in the ED. I have low suspicion for any other emergent medical condition which would require further screening, evaluation or treatment in the ED or require inpatient management. At time of discharge the patient is hemodynamically stable and in no acute distress. I have discussed work-up results and diagnosis with patient and answered all questions. Patient is agreeable with discharge plan. We discussed strict return precautions for returning to the emergency department and they verbalized understanding.     Patient management required discussion with the following services or consulting groups:  Pharmacy  Complexity of Problems Addressed Acute uncomplicated illness or injury with no diagnostics  Additional Data Reviewed and  Analyzed Further history obtained from: Past medical history and medications listed in the EMR, Care Everywhere, and Prior labs/imaging results  Patient Encounter Risk Assessment Prescriptions and SDOH impact on management  Final Clinical Impression(s) / ED Diagnoses Final diagnoses:  RSV (respiratory syncytial virus infection)    Rx / DC Orders ED Discharge Orders     None         Mickie Hillier, PA-C 05/07/22 1250    Malvin Johns, MD 05/07/22 1540

## 2022-05-07 NOTE — ED Triage Notes (Signed)
4 months pregnant, Thursday had chills/ mild sore throat, but today feels like swallowing glass.fever 99. 6 yest.numerous covid tests at home negative.

## 2022-05-07 NOTE — Discharge Instructions (Addendum)
You were seen in the emergency department today for fever and cough. You have RSV.  You can take Tylenol, plain Mucinex, Benadryl for relief of your symptoms.  Please continue drink plenty of fluids.  Please return to the emergency department if you have difficulty breathing or shortness of breath or fever that does not respond to Tylenol and Motrin.  Otherwise please follow-up with your primary care provider.

## 2022-05-08 ENCOUNTER — Encounter (HOSPITAL_BASED_OUTPATIENT_CLINIC_OR_DEPARTMENT_OTHER): Payer: Self-pay

## 2022-05-09 ENCOUNTER — Encounter: Payer: Self-pay | Admitting: Nurse Practitioner

## 2022-05-09 DIAGNOSIS — B338 Other specified viral diseases: Secondary | ICD-10-CM

## 2022-05-09 MED ORDER — FLUTICASONE PROPIONATE 50 MCG/ACT NA SUSP: 2.0000 | Freq: Every day | NASAL | 6 refills | Status: DC

## 2022-05-11 ENCOUNTER — Telehealth (INDEPENDENT_AMBULATORY_CARE_PROVIDER_SITE_OTHER): Payer: Commercial Managed Care - HMO | Admitting: Nurse Practitioner

## 2022-05-11 ENCOUNTER — Encounter: Payer: Self-pay | Admitting: Nurse Practitioner

## 2022-05-11 VITALS — BP 125/88 | HR 107 | Temp 97.8°F | Ht 64.0 in | Wt 194.0 lb

## 2022-05-11 DIAGNOSIS — B338 Other specified viral diseases: Secondary | ICD-10-CM

## 2022-05-11 NOTE — Progress Notes (Signed)
Virtual Visit Encounter mychart visit.   I connected with  [name] Curry on 05/22/22 at  1:30 PM EST by secure video and audio telemedicine application. I verified that I am speaking with the correct person using two identifiers.   I introduced myself as a Publishing rights manager with the practice. The limitations of evaluation and management by telemedicine discussed with the patient and the availability of in person appointments. The patient expressed verbal understanding and consent to proceed.  Participating parties in this visit include: Myself and patient  The patient is: Patient Location: Home I am: Provider Location: Office/Clinic Subjective:    CC and HPI: [name] Curry is a 40 y.o. year old female presenting for follow up of hospital visit for RSV. Patient reports the following: RSV positive result from hospital visit earlier this week. She is still having some symptoms of cough and congestion, but she is feeling better. She has been cautious of the medication that she is taking as she is 4 months pregnant at this time. She is not having high fevers at this time. She is not having abdominal pain or spotting.    Past medical history, Surgical history, Family history not pertinant except as noted below, Social history, Allergies, and medications have been entered into the medical record, reviewed, and corrections made.   Review of Systems:  All review of systems negative except what is listed in the HPI  Objective:    Alert and oriented x 4 Appears well. Congestion is present.  Speaking in clear sentences with no shortness of breath. No distress.  Impression and Recommendations:    Problem List Items Addressed This Visit     RSV (respiratory syncytial virus infection) - Primary    She is doing well with no alarm symptoms at this time. Recommend she continue using treatment such as mucinex, flonase, tylenol, vicks, humidifier to help with her symptoms. Encouraged her to  maintain her rest and hydration. She will follow-up if there are any concerning changes.        current treatment plan is effective, no change in therapy I discussed the assessment and treatment plan with the patient. The patient was provided an opportunity to ask questions and all were answered. The patient agreed with the plan and demonstrated an understanding of the instructions.   The patient was advised to call back or seek an in-person evaluation if the symptoms worsen or if the condition fails to improve as anticipated.  Follow-Up: prn  I provided 16 minutes of non-face-to-face interaction with this non face-to-face encounter including intake, same-day documentation, and chart review.   Tollie Eth, NP , DNP, AGNP-c [facility] at [facility] [phone] (fax)

## 2022-05-19 ENCOUNTER — Encounter (HOSPITAL_BASED_OUTPATIENT_CLINIC_OR_DEPARTMENT_OTHER): Payer: Self-pay

## 2022-05-21 ENCOUNTER — Encounter (HOSPITAL_BASED_OUTPATIENT_CLINIC_OR_DEPARTMENT_OTHER): Payer: Self-pay | Admitting: Nurse Practitioner

## 2022-05-21 DIAGNOSIS — Z3A16 16 weeks gestation of pregnancy: Secondary | ICD-10-CM | POA: Insufficient documentation

## 2022-05-21 NOTE — Assessment & Plan Note (Signed)
She is doing very well in her pregnancy so far and her vital signs look great today. She is eating well and resting well at this time. There are no symptoms present that pose alarm. Patient praised for smoking cessation. I am very excited for her and her husband. Recommend continue to follow with GYN and I will provide supportive care as needed. She may follow-up as needed.

## 2022-05-22 DIAGNOSIS — B338 Other specified viral diseases: Secondary | ICD-10-CM | POA: Insufficient documentation

## 2022-05-22 DIAGNOSIS — Z419 Encounter for procedure for purposes other than remedying health state, unspecified: Secondary | ICD-10-CM | POA: Diagnosis not present

## 2022-05-22 NOTE — L&D Delivery Note (Signed)
Obstetrical Delivery Note   Date of Delivery:   09/26/2022 Primary OB:   [facility] Gestational Age/EDD: [redacted]w[redacted]d Reason for Admission: Active labor Antepartum complications: GHTN. H/o hypothyroidism due to thyroidectomy (h/o Graves), AMA.  Delivered By:   Cornelia Copa MD and Dr. Patrcia Dolly, DO  Delivery Type:   spontaneous vaginal delivery  Delivery Details:   Called to see patient due to odd exam. 1.5cm opening seen prolapsing to -2. Opening can be seen with spreading the labia and baby's hair can be seen. It was very, white, very thin and avascular. Patient didn't have an epidural in place and felt other manipulation but did not feel intense manipulation of this area. With contractions and patient not pushing, it came down to -2. I could see my glove through this tissue and with scratching with the amni hook it did not bleed. I felt this was a thick bag. She denied any history of LEEP, cryo or conization. I cut it at 6 o'clock and there was no bleeding and patient pushed and subsequently delivered about 10-15 minutes later; there was no increased bleeding at this time. After delivery of the placenta, increased bleeding was noted and what was cut was found to the cervix at 6 o'clock. A dose of Lysteda was given and PPH protocol ordered with labs drawn.  I found the apex and 1-0 chromic suture was anchored and then ran to the external os, being careful not to go through and through to close the cervix. I added some reinforcing figure of eights along the suture for any oozing. The area was packed and the other lacerations were repaired in the usual fashion. All packing was removed and all areas observed and no bleeding observed. Clean gloves were donned and foley catheter replaced and vaginal packing of just one kerlix was placed in the vaginal/posterior fornix area with a tail left.  Anesthesia:    epidural Intrapartum complications: iatrogenic cervical laceration GBS:    Positive  Patient received Ancef 2gm IV x 1 Laceration:    Cervical and several first degree lacerations (all repaired: perineal right labia and right peri-urethral Episiotomy:    none Rectal exam:   deferred Placenta:    Delivered and expressed via active management. Intact: yes. To pathology: no.  Delayed Cord Clamping: yes Estimated Blood Loss:   Baby:    Liveborn female, APGARs 8/9, weight 2760gm  Cornelia Copa. MD Attending Center for [employer] [facility])

## 2022-05-22 NOTE — Assessment & Plan Note (Signed)
She is doing well with no alarm symptoms at this time. Recommend she continue using treatment such as mucinex, flonase, tylenol, vicks, humidifier to help with her symptoms. Encouraged her to maintain her rest and hydration. She will follow-up if there are any concerning changes.

## 2022-05-23 ENCOUNTER — Other Ambulatory Visit: Payer: Self-pay | Admitting: *Deleted

## 2022-05-23 ENCOUNTER — Ambulatory Visit: Payer: Medicaid Other | Admitting: *Deleted

## 2022-05-23 ENCOUNTER — Ambulatory Visit: Payer: Medicaid Other | Attending: Obstetrics & Gynecology

## 2022-05-23 VITALS — BP 129/71 | HR 93

## 2022-05-23 DIAGNOSIS — O09521 Supervision of elderly multigravida, first trimester: Secondary | ICD-10-CM

## 2022-05-23 DIAGNOSIS — Z148 Genetic carrier of other disease: Secondary | ICD-10-CM

## 2022-05-23 DIAGNOSIS — O0990 Supervision of high risk pregnancy, unspecified, unspecified trimester: Secondary | ICD-10-CM | POA: Insufficient documentation

## 2022-05-23 DIAGNOSIS — O09522 Supervision of elderly multigravida, second trimester: Secondary | ICD-10-CM

## 2022-05-23 DIAGNOSIS — O0991 Supervision of high risk pregnancy, unspecified, first trimester: Secondary | ICD-10-CM | POA: Insufficient documentation

## 2022-05-23 DIAGNOSIS — E039 Hypothyroidism, unspecified: Secondary | ICD-10-CM

## 2022-05-23 DIAGNOSIS — O99891 Other specified diseases and conditions complicating pregnancy: Secondary | ICD-10-CM

## 2022-05-23 DIAGNOSIS — O99282 Endocrine, nutritional and metabolic diseases complicating pregnancy, second trimester: Secondary | ICD-10-CM | POA: Diagnosis not present

## 2022-05-23 DIAGNOSIS — Z363 Encounter for antenatal screening for malformations: Secondary | ICD-10-CM

## 2022-05-23 DIAGNOSIS — O09512 Supervision of elderly primigravida, second trimester: Secondary | ICD-10-CM | POA: Diagnosis not present

## 2022-05-23 DIAGNOSIS — D563 Thalassemia minor: Secondary | ICD-10-CM

## 2022-05-23 DIAGNOSIS — Z3A19 19 weeks gestation of pregnancy: Secondary | ICD-10-CM | POA: Diagnosis not present

## 2022-05-25 ENCOUNTER — Encounter (HOSPITAL_BASED_OUTPATIENT_CLINIC_OR_DEPARTMENT_OTHER): Payer: Self-pay

## 2022-05-25 ENCOUNTER — Ambulatory Visit: Payer: Commercial Managed Care - HMO | Admitting: Nurse Practitioner

## 2022-05-26 ENCOUNTER — Ambulatory Visit (INDEPENDENT_AMBULATORY_CARE_PROVIDER_SITE_OTHER): Payer: BLUE CROSS/BLUE SHIELD | Admitting: Nurse Practitioner

## 2022-05-26 ENCOUNTER — Encounter: Payer: Self-pay | Admitting: Nurse Practitioner

## 2022-05-26 VITALS — BP 124/76 | HR 95 | Ht 64.0 in | Wt 201.0 lb

## 2022-05-26 DIAGNOSIS — B338 Other specified viral diseases: Secondary | ICD-10-CM | POA: Diagnosis not present

## 2022-05-26 NOTE — Assessment & Plan Note (Signed)
Symptom resolution with exception of intermittent cough. Recommend continue to deep breath and cough to help keep lungs clear. RSV test negative in office today. May resume normal activities. If new or worsening symptoms present, recommend re-evaluation.

## 2022-05-26 NOTE — Progress Notes (Signed)
  Orma Render, DNP, AGNP-c [place] [address] [place], [place] [address] [phone]  Subjective:   [name] Curry is a 42 y.o. female presents to day for evaluation of: RSV Ambika was diagnosed with RSV in the hospital on 12/17. She tells me that since then she has had almost complete resolution of symptoms with only a nagging cough that continues. She is not running fevers, does not have shortness of breath, wheezing, or body aches. She is still using the flonase and feels this helps with the congestion. She and her husband are planning to keep their 51 month old grandchild and she wants to make sure that she is no longer carrying the virus to ensure she is safe to be around the baby.   PMH, Medications, and Allergies reviewed and updated in chart as appropriate.   ROS negative except for what is listed in HPI. Objective:  BP 124/76 (BP Location: Right Arm, Patient Position: Sitting)   Pulse 95   Ht 5\' 4"  (1.626 m)   Wt 201 lb (91.2 kg)   LMP 12/30/2021   SpO2 98%   BMI 34.50 kg/m  Physical Exam Vitals and nursing note reviewed.  Constitutional:      Appearance: Normal appearance. She is not ill-appearing.  HENT:     Head: Normocephalic and atraumatic.     Nose: Nose normal.     Mouth/Throat:     Mouth: Mucous membranes are moist.     Pharynx: Oropharynx is clear. No oropharyngeal exudate or posterior oropharyngeal erythema.  Eyes:     Extraocular Movements: Extraocular movements intact.     Pupils: Pupils are equal, round, and reactive to light.  Cardiovascular:     Rate and Rhythm: Normal rate and regular rhythm.     Pulses: Normal pulses.     Heart sounds: Normal heart sounds.  Pulmonary:     Effort: Pulmonary effort is normal. No respiratory distress.     Breath sounds: Normal breath sounds. No wheezing or rhonchi.  Musculoskeletal:     Cervical back: Normal range of motion.  Lymphadenopathy:     Cervical: Cervical adenopathy present.   Skin:    General: Skin is warm and dry.     Capillary Refill: Capillary refill takes less than 2 seconds.  Neurological:     General: No focal deficit present.     Mental Status: She is alert and oriented to person, place, and time.  Psychiatric:        Mood and Affect: Mood normal.        Behavior: Behavior normal.        Thought Content: Thought content normal.        Judgment: Judgment normal.           Assessment & Plan:   Problem List Items Addressed This Visit     RSV (respiratory syncytial virus infection) - Primary    Symptom resolution with exception of intermittent cough. Recommend continue to deep breath and cough to help keep lungs clear. RSV test negative in office today. May resume normal activities. If new or worsening symptoms present, recommend re-evaluation.          Orma Render, DNP, AGNP-c 05/26/2022  10:30 AM    History, Medications, Surgery, SDOH, and Family History reviewed and updated as appropriate.

## 2022-05-26 NOTE — Patient Instructions (Signed)
Your test today was NEGATIVE! :-)    I want you to be careful to prevent getting a different kind of infection while your immune system builds back up.   If you have any new or worsening symptoms, please let me know.

## 2022-05-30 ENCOUNTER — Ambulatory Visit (HOSPITAL_BASED_OUTPATIENT_CLINIC_OR_DEPARTMENT_OTHER): Payer: BLUE CROSS/BLUE SHIELD | Admitting: Advanced Practice Midwife

## 2022-05-30 VITALS — BP 116/58 | HR 96 | Wt 200.0 lb

## 2022-05-30 DIAGNOSIS — I4729 Other ventricular tachycardia: Secondary | ICD-10-CM

## 2022-05-30 DIAGNOSIS — O26892 Other specified pregnancy related conditions, second trimester: Secondary | ICD-10-CM

## 2022-05-30 DIAGNOSIS — R102 Pelvic and perineal pain: Secondary | ICD-10-CM

## 2022-05-30 DIAGNOSIS — O0992 Supervision of high risk pregnancy, unspecified, second trimester: Secondary | ICD-10-CM

## 2022-05-30 DIAGNOSIS — D563 Thalassemia minor: Secondary | ICD-10-CM

## 2022-05-30 DIAGNOSIS — Z3A21 21 weeks gestation of pregnancy: Secondary | ICD-10-CM

## 2022-05-30 DIAGNOSIS — E890 Postprocedural hypothyroidism: Secondary | ICD-10-CM

## 2022-05-30 NOTE — Progress Notes (Signed)
   PRENATAL VISIT NOTE  Subjective:  [name] Curry is a 42 y.o. G1P0 at [redacted]w[redacted]d being seen today for ongoing prenatal care.  She is currently monitored for the following issues for this high-risk pregnancy and has Postablative hypothyroidism; Mass of breast; H/O Graves' disease; Family history of breast cancer in sister; BMI 36.0-36.9,adult; Pre-diabetes; Vitamin D deficiency; Mixed hyperlipidemia; Thalassemia trait, beta; Heart palpitations; NSVT (nonsustained ventricular tachycardia) ([place]); Muscle spasms of neck; AMA (advanced maternal age) multigravida 57+, first trimester; Supervision of high risk pregnancy, antepartum; Alpha thalassemia silent carrier; Bipolar disorder ([place]); [redacted] weeks gestation of pregnancy; and RSV (respiratory syncytial virus infection) on their problem list.  Patient reports  pelvic/hip pain .  Contractions: Not present. Vag. Bleeding: None.  Movement: Present. Denies leaking of fluid.   The following portions of the patient's history were reviewed and updated as appropriate: allergies, current medications, past family history, past medical history, past social history, past surgical history and problem list.   Objective:   Vitals:   05/30/22 0845  BP: (!) 116/58  Pulse: 96  Weight: 200 lb (90.7 kg)    Fetal Status: Fetal Heart Rate (bpm): 140   Movement: Present     General:  Alert, oriented and cooperative. Patient is in no acute distress.  Skin: Skin is warm and dry. No rash noted.   Cardiovascular: Normal heart rate noted  Respiratory: Normal respiratory effort, no problems with respiration noted  Abdomen: Soft, gravid, appropriate for gestational age.  Pain/Pressure: Present     Pelvic: Cervical exam deferred        Extremities: Normal range of motion.  Edema: None  Mental Status: Normal mood and affect. Normal behavior. Normal judgment and thought content.   Assessment and Plan:  Pregnancy: G1P0 at [redacted]w[redacted]d 1. Supervision of high risk pregnancy in second  trimester --Anticipatory guidance about next visits/weeks of pregnancy given.   2. NSVT (nonsustained ventricular tachycardia) ([place]) --Cardiology follow up for intermittent symptoms  3. Postablative hypothyroidism --On Synthroid, test thyroid labs Q trimester  4. Alpha thalassemia silent carrier   5. [redacted] weeks gestation of pregnancy   6. Pelvic pain affecting pregnancy in second trimester, antepartum --Pt with hip/pelvic pain causing difficulty positioning to sleep. - AMB referral to rehabilitation  Preterm labor symptoms and general obstetric precautions including but not limited to vaginal bleeding, contractions, leaking of fluid and fetal movement were reviewed in detail with the patient. Please refer to After Visit Summary for other counseling recommendations.   No follow-ups on file.  Future Appointments  Date Time Provider [place]  06/27/2022  9:30 AM [facility] NURSE [facility]  06/27/2022  9:45 AM [facility] US4 [facility]  06/30/2022 10:35 AM Megan Salon, MD [facility]  07/03/2022  1:00 PM Philemon Kingdom, MD [facility] None  07/27/2022 11:15 AM Megan Salon, MD [facility]  08/11/2022 10:55 AM Megan Salon, MD [facility]  08/25/2022  9:35 AM Megan Salon, MD [facility]  09/08/2022  9:35 AM Megan Salon, MD [facility]  09/15/2022  9:55 AM Megan Salon, MD [facility]  09/22/2022 10:35 AM Megan Salon, MD [facility]  09/29/2022 10:15 AM Megan Salon, MD [facility]    Fatima Blank, CNM

## 2022-06-07 ENCOUNTER — Encounter: Payer: Self-pay | Admitting: Nurse Practitioner

## 2022-06-09 ENCOUNTER — Encounter: Payer: Self-pay | Admitting: Nurse Practitioner

## 2022-06-09 ENCOUNTER — Ambulatory Visit (INDEPENDENT_AMBULATORY_CARE_PROVIDER_SITE_OTHER): Payer: BLUE CROSS/BLUE SHIELD | Admitting: Nurse Practitioner

## 2022-06-09 VITALS — BP 126/82 | HR 68 | Wt 202.8 lb

## 2022-06-09 DIAGNOSIS — G5603 Carpal tunnel syndrome, bilateral upper limbs: Secondary | ICD-10-CM

## 2022-06-09 NOTE — Patient Instructions (Signed)
I sent a referral back to Dr. Tempie Donning for your wrists. I want you to at least start wearing the brace on your left hand at bedtime and doing the exercises. You can get braces on amazon, but you may want to wait until Dr. Tempie Donning see's you to recommend a specific one.

## 2022-06-09 NOTE — Progress Notes (Signed)
  Orma Render, DNP, AGNP-c [place], [place] [address] [phone]  Subjective:   [name] Curry is a 42 y.o. female presents to day for evaluation of: Carpal Tunnel She tells me that her symptoms started a couple of years ago with mild carpal tunnel symptoms. She tells me over the past couple of months this has progressively worsened. She tells me her hands are swelling and very painful. She tells me her hands are numb when she wakes up in the morning and they tingle all day long. She has lost strength in her grip.   PMH, Medications, and Allergies reviewed and updated in chart as appropriate.   ROS negative except for what is listed in HPI. Objective:  BP 126/82   Pulse 68   Wt 202 lb 12.8 oz (92 kg)   LMP 12/30/2021   BMI 34.81 kg/m  Physical Exam Vitals and nursing note reviewed.  Constitutional:      Appearance: Normal appearance.  HENT:     Head: Normocephalic.  Eyes:     Extraocular Movements: Extraocular movements intact.     Pupils: Pupils are equal, round, and reactive to light.  Cardiovascular:     Rate and Rhythm: Normal rate and regular rhythm.     Pulses: Normal pulses.     Heart sounds: Normal heart sounds.  Pulmonary:     Effort: Pulmonary effort is normal.     Breath sounds: Normal breath sounds.  Musculoskeletal:        General: Normal range of motion.     Cervical back: Normal range of motion.  Skin:    General: Skin is warm and dry.     Capillary Refill: Capillary refill takes less than 2 seconds.  Neurological:     General: No focal deficit present.     Mental Status: She is alert and oriented to person, place, and time.     Sensory: Sensory deficit present.     Motor: Weakness present.  Psychiatric:        Mood and Affect: Mood normal.        Behavior: Behavior normal.           Assessment & Plan:   Problem List Items Addressed This Visit     Bilateral carpal tunnel syndrome - Primary     Symptoms and presentation consistent with bilateral carpal tunnel syndrome most likely worsened due to pregnancy. We discussed options for referral to orthopedics and wrist bracing to help with symptoms while she waits for appt. Will follow closely.       Relevant Orders   AMB referral to orthopedics      Orma Render, DNP, AGNP-c 06/19/2022  8:16 AM    History, Medications, Surgery, SDOH, and Family History reviewed and updated as appropriate.
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
## 2022-06-13 ENCOUNTER — Encounter: Payer: Self-pay | Admitting: Physical Therapy

## 2022-06-13 ENCOUNTER — Encounter: Payer: Medicaid Other | Attending: Advanced Practice Midwife | Admitting: Physical Therapy

## 2022-06-13 ENCOUNTER — Other Ambulatory Visit: Payer: Self-pay

## 2022-06-13 DIAGNOSIS — M25552 Pain in left hip: Secondary | ICD-10-CM | POA: Diagnosis not present

## 2022-06-13 DIAGNOSIS — M25551 Pain in right hip: Secondary | ICD-10-CM | POA: Diagnosis not present

## 2022-06-13 DIAGNOSIS — O26892 Other specified pregnancy related conditions, second trimester: Secondary | ICD-10-CM | POA: Diagnosis not present

## 2022-06-13 DIAGNOSIS — R102 Pelvic and perineal pain: Secondary | ICD-10-CM | POA: Diagnosis not present

## 2022-06-13 DIAGNOSIS — M6281 Muscle weakness (generalized): Secondary | ICD-10-CM | POA: Diagnosis not present

## 2022-06-14 ENCOUNTER — Ambulatory Visit (INDEPENDENT_AMBULATORY_CARE_PROVIDER_SITE_OTHER): Payer: BLUE CROSS/BLUE SHIELD | Admitting: Orthopaedic Surgery

## 2022-06-14 DIAGNOSIS — G5603 Carpal tunnel syndrome, bilateral upper limbs: Secondary | ICD-10-CM | POA: Diagnosis not present

## 2022-06-14 DIAGNOSIS — G5601 Carpal tunnel syndrome, right upper limb: Secondary | ICD-10-CM | POA: Diagnosis not present

## 2022-06-14 DIAGNOSIS — G5602 Carpal tunnel syndrome, left upper limb: Secondary | ICD-10-CM

## 2022-06-14 NOTE — Progress Notes (Signed)
Chief Complaint: Bilateral wrist pain with numbness     History of Present Illness:    [name] Curry is a 42 y.o. female primarily female presents with bilateral wrist pain and numbness with decreased grip strength during the pregnancy.  She is currently 6 months pregnant.  She does have a history of some numbness in the hand although this is overall very mild.  She has previously seen Dr. Tempie Curry for left hand UCL injury.    Surgical History:   none  PMH/PSH/Family History/Social History/Meds/Allergies:    Past Medical History:  Diagnosis Date   Anemia    Anxiety    Asthma    Beta thalassemia trait    Depression    Graves disease    Hypothyroidism 06/30/2016   Suicidal behavior 08/05/2011   Broke a light bulb and tried to cut her wrists    Thyroid disease    was hyperthyroid, had radiaton in April 2015, now hyperthyroid   Past Surgical History:  Procedure Laterality Date   COLPOSCOPY     EXCISIONAL HEMORRHOIDECTOMY  1996   HYDRADENITIS EXCISION Left 12/13/2021   Procedure: EXCISION HIDRADENITIS LEFT AXILLA;  Surgeon: Coralie Keens, MD;  Location: [place];  Service: General;  Laterality: Left;   THYROIDECTOMY N/A 06/30/2016   Procedure: TOTAL THYROIDECTOMY;  Surgeon: Armandina Gemma, MD;  Location: [place];  Service: General;  Laterality: N/A;   Social History   Socioeconomic History   Marital status: Married    Spouse name: Not on file   Number of children: Not on file   Years of education: Not on file   Highest education level: Not on file  Occupational History   Not on file  Tobacco Use   Smoking status: Former    Packs/day: 0.25    Years: 20.00    Total pack years: 5.00    Types: Cigars, Cigarettes   Smokeless tobacco: Never  Vaping Use   Vaping Use: Never used  Substance and Sexual Activity   Alcohol use: Not Currently    Comment: every 2-3 months   Drug use: Not Currently    Types: Marijuana     Comment: none since +UPT   Sexual activity: Yes    Birth control/protection: None  Other Topics Concern   Not on file  Social History Narrative   Not on file   Social Determinants of Health   Financial Resource Strain: Not on file  Food Insecurity: Not on file  Transportation Needs: Not on file  Physical Activity: Not on file  Stress: Not on file  Social Connections: Not on file   Family History  Problem Relation Age of Onset   Hypertension Mother    Arthritis Mother    Hypertension Father    Hyperlipidemia Father    Congenital heart disease Sister    Breast cancer Sister    Diabetes Maternal Grandmother    Graves' disease Maternal Grandmother    Alzheimer's disease Maternal Grandmother    Diabetes Paternal Grandmother    Hypertension Paternal Grandmother    Cancer Paternal Grandfather        lung   Diabetes Other    Allergies  Allergen Reactions   Atenolol Other (See Comments)    Muscle cramps   Methimazole Hives   Penicillins Hives and Nausea And Vomiting  Has patient had a PCN reaction causing immediate rash, facial/tongue/throat swelling, SOB or lightheadedness with hypotension: #  #  #  YES  #  #  #  Has patient had a PCN reaction causing severe rash involving mucus membranes or skin necrosis: No Has patient had a PCN reaction that required hospitalization No Has patient had a PCN reaction occurring within the last 10 years: No If all of the above answers are "NO", then may proceed with Cephalosporin use.    Sulfa Antibiotics Hives and Nausea And Vomiting   Current Outpatient Medications  Medication Sig Dispense Refill   aspirin EC 81 MG tablet Take 1 tablet (81 mg total) by mouth daily. Swallow whole. 30 tablet 12   diphenhydrAMINE (BENADRYL) 25 MG tablet Take 25 mg by mouth every 6 (six) hours as needed. (Patient not taking: Reported on 06/09/2022)     fluticasone (FLONASE) 50 MCG/ACT nasal spray Place 2 sprays into both nostrils daily. 16 g 6    levothyroxine (SYNTHROID) 88 MCG tablet Take 1 tablet (88 mcg total) by mouth daily. Except 2 tablets 2x a week. 50 tablet 5   Prenatal Vit-Fe Fumarate-FA (PRENATAL VITAMIN PLUS LOW IRON) 27-1 MG TABS Take 1 tablet by mouth daily. 30 tablet 11   No current facility-administered medications for this visit.   No results found.  Review of Systems:   A ROS was performed including pertinent positives and negatives as documented in the HPI.  Physical Exam :   Constitutional: NAD and appears stated age Neurological: Alert and oriented Psych: Appropriate affect and cooperative Last menstrual period 12/30/2021, unknown if currently breastfeeding.   Comprehensive Musculoskeletal Exam:    Positive Phalen and Tinel test.  Numbness involving the index finger middle finger ring finger full composite fist  Imaging:    I personally reviewed and interpreted the radiographs.   Assessment:   42 y.o. female with evidence of bilateral carpal tunnel syndrome which is pregnancy-induced.  I did discuss conservative measures including bracing and ergonomic keyboard which she can pursue.  I did discuss that this typically resolves at the completion of pregnancy.  She will see him back in 2 months following delivery if her symptoms persist  Plan :    -Return to clinic as needed     I personally saw and evaluated the patient, and participated in the management and treatment plan.  Vanetta Mulders, MD Attending Physician, Orthopedic Surgery  This document was dictated using Dragon voice recognition software. A reasonable attempt at proof reading has been made to minimize errors.

## 2022-06-15 ENCOUNTER — Encounter (HOSPITAL_BASED_OUTPATIENT_CLINIC_OR_DEPARTMENT_OTHER): Payer: Commercial Managed Care - HMO | Admitting: Obstetrics & Gynecology

## 2022-06-19 DIAGNOSIS — G5603 Carpal tunnel syndrome, bilateral upper limbs: Secondary | ICD-10-CM | POA: Insufficient documentation

## 2022-06-19 NOTE — Assessment & Plan Note (Signed)
Symptoms and presentation consistent with bilateral carpal tunnel syndrome most likely worsened due to pregnancy. We discussed options for referral to orthopedics and wrist bracing to help with symptoms while she waits for appt. Will follow closely.

## 2022-06-22 DIAGNOSIS — Z419 Encounter for procedure for purposes other than remedying health state, unspecified: Secondary | ICD-10-CM | POA: Diagnosis not present

## 2022-06-26 ENCOUNTER — Encounter (HOSPITAL_BASED_OUTPATIENT_CLINIC_OR_DEPARTMENT_OTHER): Payer: Commercial Managed Care - HMO | Admitting: Obstetrics & Gynecology

## 2022-06-27 ENCOUNTER — Ambulatory Visit: Payer: BLUE CROSS/BLUE SHIELD | Admitting: *Deleted

## 2022-06-27 ENCOUNTER — Other Ambulatory Visit: Payer: Self-pay | Admitting: *Deleted

## 2022-06-27 ENCOUNTER — Ambulatory Visit: Payer: BLUE CROSS/BLUE SHIELD | Attending: Obstetrics

## 2022-06-27 ENCOUNTER — Encounter: Payer: Self-pay | Admitting: *Deleted

## 2022-06-27 ENCOUNTER — Encounter: Payer: Self-pay | Admitting: Physical Therapy

## 2022-06-27 ENCOUNTER — Encounter: Payer: Medicaid Other | Attending: Advanced Practice Midwife | Admitting: Physical Therapy

## 2022-06-27 VITALS — BP 120/71 | HR 83

## 2022-06-27 DIAGNOSIS — O09521 Supervision of elderly multigravida, first trimester: Secondary | ICD-10-CM

## 2022-06-27 DIAGNOSIS — O0990 Supervision of high risk pregnancy, unspecified, unspecified trimester: Secondary | ICD-10-CM

## 2022-06-27 DIAGNOSIS — O09512 Supervision of elderly primigravida, second trimester: Secondary | ICD-10-CM | POA: Diagnosis not present

## 2022-06-27 DIAGNOSIS — E039 Hypothyroidism, unspecified: Secondary | ICD-10-CM

## 2022-06-27 DIAGNOSIS — M6281 Muscle weakness (generalized): Secondary | ICD-10-CM | POA: Diagnosis not present

## 2022-06-27 DIAGNOSIS — D563 Thalassemia minor: Secondary | ICD-10-CM

## 2022-06-27 DIAGNOSIS — E890 Postprocedural hypothyroidism: Secondary | ICD-10-CM | POA: Diagnosis not present

## 2022-06-27 DIAGNOSIS — O99280 Endocrine, nutritional and metabolic diseases complicating pregnancy, unspecified trimester: Secondary | ICD-10-CM | POA: Insufficient documentation

## 2022-06-27 DIAGNOSIS — R102 Pelvic and perineal pain: Secondary | ICD-10-CM | POA: Diagnosis not present

## 2022-06-27 DIAGNOSIS — O99282 Endocrine, nutritional and metabolic diseases complicating pregnancy, second trimester: Secondary | ICD-10-CM | POA: Diagnosis not present

## 2022-06-27 DIAGNOSIS — O26892 Other specified pregnancy related conditions, second trimester: Secondary | ICD-10-CM | POA: Diagnosis not present

## 2022-06-27 DIAGNOSIS — M25552 Pain in left hip: Secondary | ICD-10-CM | POA: Diagnosis not present

## 2022-06-27 DIAGNOSIS — M25551 Pain in right hip: Secondary | ICD-10-CM | POA: Diagnosis not present

## 2022-06-27 DIAGNOSIS — Z3A24 24 weeks gestation of pregnancy: Secondary | ICD-10-CM

## 2022-06-27 DIAGNOSIS — O09522 Supervision of elderly multigravida, second trimester: Secondary | ICD-10-CM

## 2022-06-27 DIAGNOSIS — O09523 Supervision of elderly multigravida, third trimester: Secondary | ICD-10-CM

## 2022-06-27 NOTE — Therapy (Signed)
OUTPATIENT PHYSICAL THERAPY TREATMENT NOTE   Patient Name: [name] Curry MRN: [id number] DOB:June 10, 1980, 42 y.o., female Today's Date: 06/27/2022  PCP:  Orma Render, NP  REFERRING PROVIDER:  Elvera Maria, CNM   END OF SESSION:   PT End of Session - 06/27/22 0848     Visit Number 2    Date for PT Re-Evaluation 09/05/22    Authorization Type [employer]; [employer]    Authorization Time Period 06/13/2022-08/12/2022    Authorization - Visit Number 1    Authorization - Number of Visits 10    PT Start Time 4098   came late   PT Stop Time 0923    PT Time Calculation (min) 39 min    Activity Tolerance Patient tolerated treatment well;No increased pain    Behavior During Therapy [facility] for tasks assessed/performed             Past Medical History:  Diagnosis Date   Anemia    Anxiety    Asthma    Beta thalassemia trait    Depression    Graves disease    Hypothyroidism 06/30/2016   Suicidal behavior 08/05/2011   Broke a light bulb and tried to cut her wrists    Thyroid disease    was hyperthyroid, had radiaton in April 2015, now hyperthyroid   Past Surgical History:  Procedure Laterality Date   COLPOSCOPY     EXCISIONAL HEMORRHOIDECTOMY  1996   HYDRADENITIS EXCISION Left 12/13/2021   Procedure: EXCISION HIDRADENITIS LEFT AXILLA;  Surgeon: Coralie Keens, MD;  Location: [place];  Service: General;  Laterality: Left;   THYROIDECTOMY N/A 06/30/2016   Procedure: TOTAL THYROIDECTOMY;  Surgeon: Armandina Gemma, MD;  Location: [place];  Service: General;  Laterality: N/A;   Patient Active Problem List   Diagnosis Date Noted   Bilateral carpal tunnel syndrome 06/19/2022   RSV (respiratory syncytial virus infection) 05/22/2022   [redacted] weeks gestation of pregnancy 05/21/2022   Alpha thalassemia silent carrier 04/04/2022   Supervision of high risk pregnancy, antepartum 03/08/2022   AMA (advanced maternal age) multigravida 35+, first trimester 02/23/2022   Muscle  spasms of neck 02/06/2022   Thalassemia trait, beta 10/07/2021   Heart palpitations 10/07/2021   NSVT (nonsustained ventricular tachycardia) ([place]) 10/07/2021   Pre-diabetes 07/22/2021   Vitamin D deficiency 07/22/2021   Mixed hyperlipidemia 07/22/2021   BMI 36.0-36.9,adult 07/14/2021   Family history of breast cancer in sister 10/22/2019   H/O Graves' disease 09/05/2016   Mass of breast 04/21/2014   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  The SI belt is helping.    PAIN:  Are you having pain? Yes NPRS scale: 0/10 for 06/27/22 Pain location:  bilateral hips and lower abdomen   Pain type: aching and heaviness Pain description: intermittent    Aggravating factors: laying on sides, getting up from the floor and chair Relieving factors: pull up on abdomen, work through the hip pain.    PRECAUTIONS: Other: pregnant   WEIGHT BEARING RESTRICTIONS: No   FALLS:  Has patient fallen in last 6 months? No   LIVING ENVIRONMENT: Lives with: lives with their family   OCCUPATION: work from home   PLOF: Independent   PATIENT GOALS: manage pain   PERTINENT HISTORY:  Postablative hypothyroidism; Mass of breast; H/O Graves' disease    BOWEL MOVEMENT: No issues with bowel movements   URINATION: no issues with urination.  Pain with urination: No Fully empty bladder: Yes: sometimes takes awhile to start the  stream Stream: Strong Urgency: No Frequency: frequent due to pregnancy Leakage: Coughing and Sneezing     INTERCOURSE: No pain or issues with intercourse   PREGNANCY: Currently pregnant Yes: first child  due on 10/11/22         OBJECTIVE:  PATIENT SURVEYS:  Patient limited with sitting, getting up from a chair and from the floor by 25% due to pain as per therapist discretion     COGNITION: Overall cognitive status: Within functional limits for tasks assessed                          SENSATION: Light touch: Appears intact Proprioception: Appears intact     POSTURE:  pregnancy posture   PELVIC ALIGNMENT: Pelvis in correct alignment   LUMBARAROM/PROM:Full Lumbar ROM       LOWER EXTREMITY ROM:   Passive ROM Right eval Left eval  Hip external rotation 45 45    LOWER EXTREMITY MMT:   MMT Right eval Left eval  Hip abduction 4/5 4/5    PALPATION:   General  along the hips and lumbar paraspinals                   TODAY'S TREATMENT:    06/27/22 Manual: Soft tissue mobilization: Manual work to the lower abdomen to release the tissue Neuromuscular re-education: Down training: Laying in reclined position with diaphragmatic breathing to relax the pelvic floor and reduce the pressure on the pelvic floor Quadruped compress the SI joint or use a sheet Exercises: Stretches/mobility: Stretch bilateral hip external and internal rotation with therapist assistance Quadruped with hips internally rotated and rock back and forth, elbows on pummel Strengthening: Quadruped with TA engagement, elbows on pummel Quadruped fire hydrant 5x each side with TA engagement but had increased pain Sitting knee flexion with red band 10x each leg with gluteal squeeze Sit to stand with pelvic floor and gluteal squeeze 15x   PATIENT EDUCATION:  06/27/22 Education details: Access Code: [email or web address], ways to compress the SI joint in quadruped with hands or sheet Person educated:  Patient Education method: Explanation, Demonstration, Tactile cues, Verbal cues, and Handouts Education comprehension: verbalized understanding, returned demonstration, verbal cues required, tactile cues required, and needs further education   HOME EXERCISE PROGRAM: 06/27/22 Access Code: [email or web address] URL: [email or web address] Date: 06/27/2022 Prepared by: Earlie Counts  Program Notes massage the lower abdomen while laying in reclined position  Exercises - Seated Piriformis Stretch  - 1 x daily - 7 x weekly -  1 sets - 2 reps - 30 sec hold - Seated Hamstring Stretch  - 1 x daily - 7 x weekly - 1 sets - 2 reps - 30 sec hold - Side Lunge Adductor Stretch  - 1 x daily - 7 x weekly - 1 sets - 2 reps - 30 sec hold - Standing Hip Flexor Stretch  - 1 x daily - 7 x weekly - 1 sets - 2 reps - 30 sec hold - Reclined Diaphragmatic Breathing  - 1 x daily - 7 x weekly - 1 sets - 10 reps - Quadruped Rocking Slow  - 1 x daily - 7 x weekly - 1 sets - 10 reps - Quadruped Transversus Abdominis Bracing  - 1 x daily - 7 x weekly - 1 sets - 10 reps - Seated Hamstring Curl with Anchored Resistance  - 1 x daily - 3 x weekly - 1 sets - 10 reps - Sit to Stand Without Arm Support  - 1 x daily - 3 x weekly - 1 sets - 15 reps   ASSESSMENT:   CLINICAL IMPRESSION: Patient is a 42 y.o. female who was seen today for physical therapy treatment for pelvic pain during pregnancy. Patient is using her SI belt and is helping with her pain. She is learning exercises for SI stability. She has difficulty with quadruped on her hands so she is better leaning on her elbows.  Patient will benefit from skilled therapy to assist with pain management and correct body mechanics to reduce her pain during her pregnancy.     OBJECTIVE IMPAIRMENTS: decreased activity tolerance, decreased endurance, decreased strength, and pain.    ACTIVITY LIMITATIONS: sitting, standing, squatting, transfers, bed mobility, and continence    PARTICIPATION LIMITATIONS: driving and occupation   PERSONAL FACTORS: 1-2 comorbidities: Postablative hypothyroidism; Mass of breast; H/O Graves' disease   are also affecting patient's functional outcome.    REHAB POTENTIAL: Excellent   CLINICAL DECISION MAKING: Stable/uncomplicated   EVALUATION COMPLEXITY: Low     GOALS: Goals reviewed with patient? Yes   SHORT TERM GOALS: Target date: 07/11/22   Patient independent in putting her abdominal brace on.  Baseline: not educated yet Goal status: Met 06/27/22   2.  Patient is independent with hip stretches.  Baseline: not educated yet Goal status: Met 06/27/22     LONG TERM GOALS: Target date: 09/05/22   Patient is independent with advanced HEP for general strength.  Baseline: Not educated yet Goal status: INITIAL   2.  Patient independent with pain management during her pregnancy.  Baseline: Not educated yet Goal status: INITIAL   3.  Patient understands correct body mechanics with daily tasks to reduce her pain.  Baseline: not educated yet Goal status: INITIAL   4.  Patient reports pain has not gone above 5/10 due to using her abdominal brace and correct mechanics.  Baseline: pain level 8/10 Goal status: INITIAL     PLAN:   PT FREQUENCY: 1x/week   PT DURATION: 12 weeks   PLANNED INTERVENTIONS: Therapeutic exercises, Therapeutic activity, Neuromuscular re-education, Patient/Family education, Dry Needling, Cryotherapy, Moist heat, Taping, and Manual therapy   PLAN FOR NEXT SESSION: manual work to the hips, posterior tilt of the pelvis, bed mechanics, SI stabilization  Earlie Counts, PT 06/27/22 9:29 AM

## 2022-06-29 ENCOUNTER — Ambulatory Visit (INDEPENDENT_AMBULATORY_CARE_PROVIDER_SITE_OTHER): Payer: BLUE CROSS/BLUE SHIELD | Admitting: Orthopaedic Surgery

## 2022-06-29 DIAGNOSIS — G5603 Carpal tunnel syndrome, bilateral upper limbs: Secondary | ICD-10-CM

## 2022-06-29 DIAGNOSIS — G5602 Carpal tunnel syndrome, left upper limb: Secondary | ICD-10-CM

## 2022-06-29 DIAGNOSIS — G5601 Carpal tunnel syndrome, right upper limb: Secondary | ICD-10-CM

## 2022-06-29 NOTE — Progress Notes (Signed)
Office Visit Note   Patient: [name] Curry           Date of Birth: 12-26-1980           MRN: [id number] Visit Date: 06/29/2022              Requested by: Orma Render, NP [place],  [place] [id number] PCP: Orma Render, NP   Assessment & Plan: Visit Diagnoses:  1. Carpal tunnel syndrome, left upper limb   2. Carpal tunnel syndrome, right upper limb     Plan: Impression is bilateral pregnancy-induced carpal tunnel syndrome.  Disease process reviewed.  Treatment options were explained and she would like to try nighttime splinting for both hands.  She will follow-up for cortisone injections if she decides her symptoms warrant them.  Follow-Up Instructions: No follow-ups on file.   Orders:  No orders of the defined types were placed in this encounter.  No orders of the defined types were placed in this encounter.     Procedures: No procedures performed   Clinical Data: No additional findings.   Subjective: Chief Complaint  Patient presents with   Right Hand - Pain   Left Hand - Pain    HPI [name] Curry is a 42 year old female here with her husband for evaluation of bilateral pregnancy-induced carpal tunnel syndrome.  Reports pain all day long.  Wears compression wraps that helped a little bit.  Feels like her hands are always swollen.  She is 6 months pregnant.  Reports constant numbness and tingling. Review of Systems  Constitutional: Negative.   HENT: Negative.    Eyes: Negative.   Respiratory: Negative.    Cardiovascular: Negative.   Endocrine: Negative.   Musculoskeletal: Negative.   Neurological: Negative.   Hematological: Negative.   Psychiatric/Behavioral: Negative.    All other systems reviewed and are negative.    Objective: Vital Signs: LMP 12/30/2021   Physical Exam Vitals and nursing note reviewed.  Constitutional:      Appearance: She is well-developed.  HENT:     Head: Normocephalic and atraumatic.  Pulmonary:      Effort: Pulmonary effort is normal.  Abdominal:     Palpations: Abdomen is soft.  Musculoskeletal:     Cervical back: Neck supple.  Skin:    General: Skin is warm.     Capillary Refill: Capillary refill takes less than 2 seconds.  Neurological:     Mental Status: She is alert and oriented to person, place, and time.  Psychiatric:        Behavior: Behavior normal.        Thought Content: Thought content normal.        Judgment: Judgment normal.     Ortho Exam Examination of bilateral hands show positive carpal tunnel compressive signs. Specialty Comments:  No specialty comments available.  Imaging: No results found.   PMFS History: Patient Active Problem List   Diagnosis Date Noted   Bilateral carpal tunnel syndrome 06/19/2022   RSV (respiratory syncytial virus infection) 05/22/2022   [redacted] weeks gestation of pregnancy 05/21/2022   Alpha thalassemia silent carrier 04/04/2022   Supervision of high risk pregnancy, antepartum 03/08/2022   AMA (advanced maternal age) multigravida 35+, first trimester 02/23/2022   Muscle spasms of neck 02/06/2022   Thalassemia trait, beta 10/07/2021   Heart palpitations 10/07/2021   NSVT (nonsustained ventricular tachycardia) ([place]) 10/07/2021   Pre-diabetes 07/22/2021   Vitamin D deficiency 07/22/2021   Mixed hyperlipidemia 07/22/2021   BMI 36.0-36.9,adult  07/14/2021   Family history of breast cancer in sister 10/22/2019   H/O Graves' disease 09/05/2016   Mass of breast 04/21/2014   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   Past Medical History:  Diagnosis Date   Anemia    Anxiety    Asthma    Beta thalassemia trait    Depression    Graves disease    Hypothyroidism 06/30/2016   Suicidal behavior 08/05/2011   Broke a light bulb and tried to cut her wrists    Thyroid disease    was hyperthyroid, had radiaton in April 2015, now hyperthyroid    Family History  Problem Relation Age of Onset   Hypertension Mother    Arthritis Mother     Hypertension Father    Hyperlipidemia Father    Congenital heart disease Sister    Breast cancer Sister    Diabetes Maternal Grandmother    Graves' disease Maternal Grandmother    Alzheimer's disease Maternal Grandmother    Diabetes Paternal Grandmother    Hypertension Paternal Grandmother    Cancer Paternal Grandfather        lung   Diabetes Other     Past Surgical History:  Procedure Laterality Date   COLPOSCOPY     EXCISIONAL HEMORRHOIDECTOMY  1996   HYDRADENITIS EXCISION Left 12/13/2021   Procedure: EXCISION HIDRADENITIS LEFT AXILLA;  Surgeon: Coralie Keens, MD;  Location: [place];  Service: General;  Laterality: Left;   THYROIDECTOMY N/A 06/30/2016   Procedure: TOTAL THYROIDECTOMY;  Surgeon: Armandina Gemma, MD;  Location: [place];  Service: General;  Laterality: N/A;   Social History   Occupational History   Not on file  Tobacco Use   Smoking status: Former    Packs/day: 0.25    Years: 20.00    Total pack years: 5.00    Types: Cigars, Cigarettes   Smokeless tobacco: Never  Vaping Use   Vaping Use: Never used  Substance and Sexual Activity   Alcohol use: Not Currently    Comment: every 2-3 months   Drug use: Not Currently    Types: Marijuana    Comment: none since +UPT   Sexual activity: Yes    Birth control/protection: None

## 2022-06-30 ENCOUNTER — Ambulatory Visit (INDEPENDENT_AMBULATORY_CARE_PROVIDER_SITE_OTHER): Payer: BLUE CROSS/BLUE SHIELD | Admitting: Obstetrics & Gynecology

## 2022-06-30 VITALS — BP 111/67 | HR 86 | Wt 208.6 lb

## 2022-06-30 DIAGNOSIS — Z6836 Body mass index (BMI) 36.0-36.9, adult: Secondary | ICD-10-CM

## 2022-06-30 DIAGNOSIS — O0992 Supervision of high risk pregnancy, unspecified, second trimester: Secondary | ICD-10-CM

## 2022-06-30 DIAGNOSIS — R7303 Prediabetes: Secondary | ICD-10-CM

## 2022-06-30 DIAGNOSIS — E890 Postprocedural hypothyroidism: Secondary | ICD-10-CM

## 2022-06-30 DIAGNOSIS — Z8639 Personal history of other endocrine, nutritional and metabolic disease: Secondary | ICD-10-CM

## 2022-06-30 DIAGNOSIS — O09521 Supervision of elderly multigravida, first trimester: Secondary | ICD-10-CM

## 2022-06-30 DIAGNOSIS — Z87898 Personal history of other specified conditions: Secondary | ICD-10-CM

## 2022-06-30 DIAGNOSIS — Z3A26 26 weeks gestation of pregnancy: Secondary | ICD-10-CM

## 2022-06-30 DIAGNOSIS — D563 Thalassemia minor: Secondary | ICD-10-CM

## 2022-06-30 DIAGNOSIS — O0990 Supervision of high risk pregnancy, unspecified, unspecified trimester: Secondary | ICD-10-CM

## 2022-06-30 DIAGNOSIS — Z23 Encounter for immunization: Secondary | ICD-10-CM | POA: Diagnosis not present

## 2022-07-01 LAB — ANTIBODY SCREEN: Antibody Screen: NEGATIVE

## 2022-07-01 LAB — HIV ANTIBODY (ROUTINE TESTING W REFLEX): HIV Screen 4th Generation wRfx: NONREACTIVE

## 2022-07-01 LAB — CBC
Hematocrit: 37.2 % (ref 34.0–46.6)
Hemoglobin: 12.5 g/dL (ref 11.1–15.9)
MCH: 26.5 pg — ABNORMAL LOW (ref 26.6–33.0)
MCHC: 33.6 g/dL (ref 31.5–35.7)
MCV: 79 fL (ref 79–97)
Platelets: 240 10*3/uL (ref 150–450)
RBC: 4.72 x10E6/uL (ref 3.77–5.28)
RDW: 14.3 % (ref 11.7–15.4)
WBC: 10.4 10*3/uL (ref 3.4–10.8)

## 2022-07-01 LAB — GLUCOSE TOLERANCE, 2 HOURS W/ 1HR
Glucose, 1 hour: 100 mg/dL (ref 70–179)
Glucose, 2 hour: 84 mg/dL (ref 70–152)
Glucose, Fasting: 85 mg/dL (ref 70–91)

## 2022-07-01 LAB — RPR: RPR Ser Ql: NONREACTIVE

## 2022-07-02 NOTE — Progress Notes (Signed)
PRENATAL VISIT NOTE  Subjective:  [name] Curry is a 42 y.o. G1P0 at 50w2dbeing seen today for ongoing prenatal care.  She is currently monitored for the following issues for this high-risk pregnancy and has Postablative hypothyroidism; Mass of breast; H/O Graves' disease; Family history of breast cancer in sister; BMI 36.0-36.9,adult; Pre-diabetes; Vitamin D deficiency; Mixed hyperlipidemia; Beta thalassemia trait; Heart palpitations; NSVT (nonsustained ventricular tachycardia) ([place]; Muscle spasms of neck; AMA (advanced maternal age) multigravida 366+ first trimester; Supervision of high risk pregnancy, antepartum; [redacted] weeks gestation of pregnancy; RSV (respiratory syncytial virus infection); and Bilateral carpal tunnel syndrome on their problem list.  Patient reports no complaints.  Contractions: Not present. Vag. Bleeding: None.  Movement: Present. Denies leaking of fluid.   The following portions of the patient's history were reviewed and updated as appropriate: allergies, current medications, past family history, past medical history, past social history, past surgical history and problem list.   Objective:   Vitals:   06/30/22 1048  BP: 111/67  Pulse: 86  Weight: 208 lb 9.6 oz (94.6 kg)    Fetal Status: Fetal Heart Rate (bpm): 142 Fundal Height: 26 cm Movement: Present     General:  Alert, oriented and cooperative. Patient is in no acute distress.  Skin: Skin is warm and dry. No rash noted.   Cardiovascular: Normal heart rate noted  Respiratory: Normal respiratory effort, no problems with respiration noted  Abdomen: Soft, gravid, appropriate for gestational age.  Pain/Pressure: Present     Pelvic: Cervical exam deferred        Extremities: Normal range of motion.  Edema: Trace  Mental Status: Normal mood and affect. Normal behavior. Normal judgment and thought content.   Assessment and Plan:  Pregnancy: G1P0 at [redacted]w[redacted]d. Supervision of high risk pregnancy in second  trimester - on PNV and baby ASA - Antibody screen - CBC - Glucose Tolerance, 2 Hours w/1 Hour - HIV Antibody (routine testing w rflx) - RPR - Tdap will be updated today as well  2. [redacted] weeks gestation of pregnancy - follow up 4 weeks  3. Postablative hypothyroidism - seeing Dr. GhCruzita Ledererext week - growth scans being done with MFM  4. H/O Graves' disease  5. BMI 36.0-36.9,adult - early GTT normal.  Repeat being done today.  6. History of prediabetes  7.  AMA  Preterm labor symptoms and general obstetric precautions including but not limited to vaginal bleeding, contractions, leaking of fluid and fetal movement were reviewed in detail with the patient. Please refer to After Visit Summary for other counseling recommendations.   Return in about 4 weeks (around 07/28/2022).  Future Appointments  Date Time Provider DeTrego2/04/2023  1:00 PM GhPhilemon KingdomMD [facility] None  07/11/2022  8:30 AM GrMonico HoarPT [facility]/27/2024  9:30 AM Gr[facility]ico HoarPT [facility]09/2022  8:30 AM [facility]PT [facility]/11/2022 11:15 AM MiMegan SalonMD [facility]  08/01/2022  8:45 AM [facility] NURSE [facility]3/04/2023  9:00 AM [facility] US1 [facility]3/14/2024  9:30 AM GrMonico HoarPT [facility]3/22/2024 10:55 AM MiMegan SalonMD [facility]  08/25/2022  9:35 AM MiMegan SalonMD [facility]  09/08/2022  9:35 AM MiMegan SalonMD [facility]  09/15/2022  9:55 AM MiMegan SalonMD [facility]  09/22/2022 10:35 AM MiMegan SalonMD [facility]  09/29/2022 10:15 AM MiSabra Heck  Lemmie Evens, MD [facility]    Megan Salon, MD

## 2022-07-03 ENCOUNTER — Encounter (HOSPITAL_BASED_OUTPATIENT_CLINIC_OR_DEPARTMENT_OTHER): Payer: Commercial Managed Care - HMO | Admitting: Obstetrics & Gynecology

## 2022-07-03 ENCOUNTER — Ambulatory Visit: Payer: BLUE CROSS/BLUE SHIELD | Admitting: Internal Medicine

## 2022-07-03 ENCOUNTER — Encounter: Payer: Self-pay | Admitting: Internal Medicine

## 2022-07-03 VITALS — BP 128/80 | HR 107 | Ht 64.0 in | Wt 210.4 lb

## 2022-07-03 DIAGNOSIS — R7303 Prediabetes: Secondary | ICD-10-CM | POA: Diagnosis not present

## 2022-07-03 DIAGNOSIS — Z8639 Personal history of other endocrine, nutritional and metabolic disease: Secondary | ICD-10-CM

## 2022-07-03 DIAGNOSIS — E890 Postprocedural hypothyroidism: Secondary | ICD-10-CM | POA: Diagnosis not present

## 2022-07-03 LAB — HEMOGLOBIN A1C: Hgb A1c MFr Bld: 6 % (ref 4.6–6.5)

## 2022-07-03 LAB — T4, FREE: Free T4: 0.6 ng/dL (ref 0.60–1.60)

## 2022-07-03 LAB — TSH: TSH: 3.34 u[IU]/mL (ref 0.35–5.50)

## 2022-07-03 NOTE — Patient Instructions (Addendum)
Please continue Levothyroxine 88 mcg daily + 2 extra tablets a week.  Take the thyroid hormone every day, with water, at least 30 minutes before breakfast, separated by at least 4 hours from: - acid reflux medications - calcium - iron - multivitamins  Please stop at the lab.  After you give birth, decrease to 88 mcg daily and come back for labs in 5 weeks.  Please come back for a follow-up appointment in 6 months.

## 2022-07-03 NOTE — Progress Notes (Unsigned)
Patient ID: [name] Curry, female   DOB: 07/04/1980, 42 y.o.   MRN: [id number]  HPI  [name] Curry is a 42 y.o.-year-old female, initially referred by Dr. 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 Last visit 4 months ago.  Interim history: She is currently pregnant, [redacted] weeks along. Will have a girl. Her nausea resolved, no vomiting.  She has full tunnel syndrome and uses braces.  She also has hip pain and is in physical therapy.  Reviewed history: In 05/2013, she was seen in the ED for a peritonsillar abscess recently and was also found to have a goiter and thyrotoxicosis. Pt was started on MMI 10 mg bid and referred to me.  She developed hives on this.   She was also on Atenolol 25 mg daily >> stopped because of muscle cramps.  We checked an Uptake and scan that showed MNG and most likely Graves ds.  She stopped MMI 2 weeks prior to RAI tx, on 08/22/2013.  She became hypothyroid afterwards >> started Levothyroxine 75 mcg daily, but her dose was gradually decreased and then stopped last summer as she became thyrotoxic again. Unfortunately, she was lost for f/u afterwards as she lost her insurance.  She returned for a follow-up in 04/2016 >> was feeling poorly and TFTs were thyrotoxic >> I referred her to surgery and she had total thyroidectomy on 06/30/2016 with Dr. Harlow Asa.  She had some neck pain after surgery, now resolved.  We started levothyroxine soon after surgery.  We initially started 125 mcg daily, but had to decrease the dose to 100 mcg daily and then to 88 mcg daily.   In 09/2021, TSH was suppressed so we decreased the dose of levothyroxine to 88 mcg 6/7 days.  After pregnancy detected, we the dose to 75 mcg levothyroxine 9/7 days -after pregnancy detected.  In 02/2022, we changed the dose to 88 mcg levothyroxine 9/7 days.  She takes this: - in am - fasting - at least 30 min from  b'fast - no Ca, Fe, PPIs - prenatal MVIs, ASA at night - off Biotin 5000 mcg   Latest TSH was normal: Lab Results  Component Value Date   TSH 2.88 02/27/2022   TSH 4.27 12/12/2021   TSH 0.18 (L) 10/04/2021   TSH 1.110 07/14/2021   TSH 1.852 04/20/2021   TSH 0.77 09/28/2020   TSH 0.33 (L) 09/29/2019   TSH 0.32 (L) 02/27/2018   TSH 1.12 07/31/2017   TSH 0.01 (L) 11/28/2016   FREET4 0.70 02/27/2022   FREET4 0.97 12/12/2021   FREET4 0.99 10/04/2021   FREET4 1.00 04/20/2021   FREET4 0.99 09/28/2020   FREET4 0.96 09/29/2019   FREET4 1.13 02/27/2018   FREET4 0.84 07/31/2017   FREET4 1.22 11/28/2016   FREET4 1.33 09/06/2016   01/13/2020: TSH 0.615  Her TSI antibodies were initially elevated but they became undetectably low: Lab Results  Component Value Date   TSI <89 02/27/2018   TSI >700 (H) 05/01/2016   Pt denies: - feeling nodules in neck - hoarseness - dysphagia - choking  No FH of thyroid cancer. No h/o radiation tx to head or neck. No herbal supplements. No Biotin use. No recent steroids use.   She also has mild prediabetes: Lab Results  Component Value Date   HGBA1C 6.0 02/27/2022   HGBA1C 5.8 10/04/2021   HGBA1C 5.9 (H) 07/14/2021   She had 2 OGTT's and I was able to review since  last visit: normal: Component     Latest Ref Rng 05/04/2022 06/30/2022  Glucose, Fasting     70 - 91 mg/dL 79  85   Glucose, 1 hour     70 - 179 mg/dL 116  100   Glucose, 2 hour     70 - 152 mg/dL 75  84    Previously on Ozempic mostly for weight loss.  Now off.  ROS: + see HPI  I reviewed pt's medications, allergies, PMH, social hx, family hx, and changes were documented in the history of present illness. Otherwise, unchanged from my initial visit note.  Past Medical History:  Diagnosis Date   Anemia    Anxiety    Asthma    Beta thalassemia trait    Depression    Graves disease    Hypothyroidism 06/30/2016   Suicidal behavior 08/05/2011   Broke a light bulb and  tried to cut her wrists    Thyroid disease    was hyperthyroid, had radiaton in April 2015, now hyperthyroid   Past Surgical History:  Procedure Laterality Date   COLPOSCOPY     EXCISIONAL HEMORRHOIDECTOMY  1996   HYDRADENITIS EXCISION Left 12/13/2021   Procedure: EXCISION HIDRADENITIS LEFT AXILLA;  Surgeon: Coralie Keens, MD;  Location: [place];  Service: General;  Laterality: Left;   THYROIDECTOMY N/A 06/30/2016   Procedure: TOTAL THYROIDECTOMY;  Surgeon: Armandina Gemma, MD;  Location: [place];  Service: General;  Laterality: N/A;   Social History   Socioeconomic History   Marital status: Married    Spouse name: Not on file   Number of children: Not on file   Years of education: Not on file   Highest education level: Not on file  Occupational History   Not on file  Tobacco Use   Smoking status: Former    Packs/day: 0.25    Years: 20.00    Total pack years: 5.00    Types: Cigars, Cigarettes   Smokeless tobacco: Never  Vaping Use   Vaping Use: Never used  Substance and Sexual Activity   Alcohol use: Not Currently    Comment: every 2-3 months   Drug use: Not Currently    Types: Marijuana    Comment: none since +UPT   Sexual activity: Yes    Birth control/protection: None  Other Topics Concern   Not on file  Social History Narrative   Not on file   Social Determinants of Health   Financial Resource Strain: Not on file  Food Insecurity: Not on file  Transportation Needs: Not on file  Physical Activity: Not on file  Stress: Not on file  Social Connections: Not on file  Intimate Partner Violence: Not on file   Current Outpatient Medications on File Prior to Visit  Medication Sig Dispense Refill   aspirin EC 81 MG tablet Take 1 tablet (81 mg total) by mouth daily. Swallow whole. 30 tablet 12   diphenhydrAMINE (BENADRYL) 25 MG tablet Take 25 mg by mouth every 6 (six) hours as needed. (Patient not taking: Reported on 06/09/2022)     fluticasone  (FLONASE) 50 MCG/ACT nasal spray Place 2 sprays into both nostrils daily. 16 g 6   levothyroxine (SYNTHROID) 88 MCG tablet Take 1 tablet (88 mcg total) by mouth daily. Except 2 tablets 2x a week. 50 tablet 5   Prenatal Vit-Fe Fumarate-FA (PRENATAL VITAMIN PLUS LOW IRON) 27-1 MG TABS Take 1 tablet by mouth daily. 30 tablet 11   No current facility-administered medications  on file prior to visit.   Allergies  Allergen Reactions   Atenolol Other (See Comments)    Muscle cramps   Methimazole Hives   Penicillins Hives and Nausea And Vomiting     Has patient had a PCN reaction causing immediate rash, facial/tongue/throat swelling, SOB or lightheadedness with hypotension: #  #  #  YES  #  #  #  Has patient had a PCN reaction causing severe rash involving mucus membranes or skin necrosis: No Has patient had a PCN reaction that required hospitalization No Has patient had a PCN reaction occurring within the last 10 years: No If all of the above answers are "NO", then may proceed with Cephalosporin use.    Sulfa Antibiotics Hives and Nausea And Vomiting   Family History  Problem Relation Age of Onset   Hypertension Mother    Arthritis Mother    Hypertension Father    Hyperlipidemia Father    Congenital heart disease Sister    Breast cancer Sister    Diabetes Maternal Grandmother    Graves' disease Maternal Grandmother    Alzheimer's disease Maternal Grandmother    Diabetes Paternal Grandmother    Hypertension Paternal Grandmother    Cancer Paternal Grandfather        lung   Diabetes Other    PE: BP 128/80 (BP Location: Right Arm, Patient Position: Sitting, Cuff Size: Normal)   Pulse (!) 107   Ht 5' 4"$  (1.626 m)   Wt 210 lb 6.4 oz (95.4 kg)   LMP 12/30/2021   SpO2 96%   BMI 36.12 kg/m   Wt Readings from Last 3 Encounters:  07/03/22 210 lb 6.4 oz (95.4 kg)  06/30/22 208 lb 9.6 oz (94.6 kg)  06/09/22 202 lb 12.8 oz (92 kg)   Constitutional: Pregnant appearing, in NAD Eyes:   EOMI, no exophthalmos ENT: no neck masses, no cervical lymphadenopathy Cardiovascular: tachycardia, RR, No MRG Respiratory: CTA B Musculoskeletal: no deformities Skin:no rashes Neurological: no tremor with outstretched hands  ASSESSMENT: 1. Postablative hypothyroidism - after thyroidectomy for Graves' disease  - CT neck (06/05/2013):  Decrease in size of right tonsil abscess, previously 11 x 17 mm, now 6 x 6 mm with decreased inflammatory changes.  Thyromegaly is noted, with 19 mm AP isthmus and substernal extent. 8 mm left thyroid nodule, with additional smaller nor nodules noted.  Prominent jugulodigastric lymph nodes, without pathologic features.  Apparent intimal thickening of the left internal carotid artery.   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  - 07/31/2013: Thyroid Uptake and scan:  4 hr radio iodine uptake is calculated at 46%, above the normal range consistent with hyperthyroidism. Images in 3 projections demonstrate diffuse enlargement of thyroid lobes bilaterally. Multiple small areas of slightly increased in decreased tracer localization identified suggesting diffuse small nodules. No dominant cold or hot nodule identified.  - 08/22/2013: RAI Tx 17.6 mCi.  She was off MMI x 2 weeks prior to the Uptake and scan  - 06/30/2016: Total thyroidectomy - Dr. Harlow Asa  2.  History of Graves' disease  3. Prediabetes  PLAN:  1.  Patient with history of hyperthyroidism, initially considered due to either toxic multinodular goiter versus Graves' disease, with very high TSI antibodies pointing towards Graves' disease.  She initially had thyrotoxic symptoms: Weight loss, heat intolerance, tremors, palpitations, anxiety, which have all resolved after RAI treatment.  She developed post ablative hypothyroidism and was briefly on levothyroxine,  however, she was then lost for follow-up and she had recurrence  of her thyrotoxicosis.  Therefore, I referred her for total thyroidectomy which she had in 2018 with Dr. Harlow Asa.  We initially started on 125 mcg levothyroxine but we gradually decrease the dose afterwards. - she is currently pregnant - week 26 -Last visit we discussed about targets for TSH for pregnancy: First trimester: <2.5, afterwards, <3 - latest thyroid labs reviewed with pt. >> normal: Lab Results  Component Value Date   TSH 2.88 02/27/2022  - she continues on LT4 88 mcg 9/7 days, dose increased at last visit - pt feels good on this dose, has good energy - we discussed about taking the thyroid hormone every day, with water, >30 minutes before breakfast, separated by >4 hours from acid reflux medications, calcium, iron, multivitamins. Pt. is taking it correctly. - will check thyroid tests today: TSH and fT4 - If labs are abnormal, she will need to return for repeat TFTs in 1.5 months - we discussed to go back to the 88 mcg of levothyroxine immediately after she gives birth and come back for labs in 5 weeks afterwards  2.  History of Graves' disease -She has mild exophthalmos, most likely related to her Graves' disease -Latest TSI antibody titer was undetectable; will repeat this today -No signs of active Graves' ophthalmopathy: No double vision, blurry vision, eye pain, chemosis  3. Prediabetes -Her insurance did not approve Ozempic in the past -She was able to lose 25 pounds after she had an HbA1c that was higher than goal, at 5.9%, HbA1c improving in 09/2021 to 5.8%. -At last visit, HbA1c was 6.0% -during pregnancy -At last visit, we discussed about checking blood sugars during the pregnancy and recommended to buy a glucometer.  She did not check the blood sugars consistently.  We again discussed about targets for CBG during pregnancy: <95 fasting, and <140 1 hour after meals, <120 2h after meals.  -Will repeat her HbA1c today  Need refills.  Philemon Kingdom, MD PhD [facility]  Endocrinology

## 2022-07-05 LAB — THYROID STIMULATING IMMUNOGLOBULIN: TSI: <89 % baseline (ref <140)

## 2022-07-06 ENCOUNTER — Encounter (HOSPITAL_BASED_OUTPATIENT_CLINIC_OR_DEPARTMENT_OTHER): Payer: Self-pay | Admitting: Obstetrics & Gynecology

## 2022-07-06 MED ORDER — LEVOTHYROXINE SODIUM 125 MCG PO TABS: 125.0000 ug | ORAL_TABLET | Freq: Every day | ORAL | 6 refills | Status: DC

## 2022-07-10 ENCOUNTER — Encounter: Payer: Self-pay | Admitting: *Deleted

## 2022-07-11 ENCOUNTER — Encounter (HOSPITAL_BASED_OUTPATIENT_CLINIC_OR_DEPARTMENT_OTHER): Payer: Commercial Managed Care - HMO | Admitting: Advanced Practice Midwife

## 2022-07-11 ENCOUNTER — Encounter: Payer: Self-pay | Admitting: Physical Therapy

## 2022-07-11 ENCOUNTER — Encounter: Payer: Medicaid Other | Admitting: Physical Therapy

## 2022-07-11 DIAGNOSIS — M25552 Pain in left hip: Secondary | ICD-10-CM | POA: Diagnosis not present

## 2022-07-11 DIAGNOSIS — O26892 Other specified pregnancy related conditions, second trimester: Secondary | ICD-10-CM

## 2022-07-11 DIAGNOSIS — M6281 Muscle weakness (generalized): Secondary | ICD-10-CM | POA: Diagnosis not present

## 2022-07-11 DIAGNOSIS — R102 Pelvic and perineal pain: Secondary | ICD-10-CM | POA: Diagnosis not present

## 2022-07-11 DIAGNOSIS — M25551 Pain in right hip: Secondary | ICD-10-CM | POA: Diagnosis not present

## 2022-07-11 NOTE — Therapy (Signed)
OUTPATIENT PHYSICAL THERAPY TREATMENT NOTE   Patient Name: [name] Curry MRN: [id number] DOB:March 13, 1981, 42 y.o., female Today's Date: 07/11/2022  PCP: Orma Render, NP  REFERRING PROVIDER: Elvera Maria, CNM    END OF SESSION:   PT End of Session - 07/11/22 0849     Visit Number 3    Date for PT Re-Evaluation 09/05/22    Authorization Type [employer]; [employer]    Authorization Time Period 06/13/2022-08/12/2022    Authorization - Visit Number 2    Authorization - Number of Visits 10    PT Start Time 0845   ran late   PT Stop Time 0925    PT Time Calculation (min) 40 min    Activity Tolerance Patient tolerated treatment well;No increased pain    Behavior During Therapy [facility] for tasks assessed/performed             Past Medical History:  Diagnosis Date   Anemia    Anxiety    Asthma    Beta thalassemia trait    Depression    Graves disease    Hypothyroidism 06/30/2016   Suicidal behavior 08/05/2011   Broke a light bulb and tried to cut her wrists    Thyroid disease    was hyperthyroid, had radiaton in April 2015, now hyperthyroid   Past Surgical History:  Procedure Laterality Date   COLPOSCOPY     EXCISIONAL HEMORRHOIDECTOMY  1996   HYDRADENITIS EXCISION Left 12/13/2021   Procedure: EXCISION HIDRADENITIS LEFT AXILLA;  Surgeon: Coralie Keens, MD;  Location: [place];  Service: General;  Laterality: Left;   THYROIDECTOMY N/A 06/30/2016   Procedure: TOTAL THYROIDECTOMY;  Surgeon: Armandina Gemma, MD;  Location: [place];  Service: General;  Laterality: N/A;   Patient Active Problem List   Diagnosis Date Noted   Bilateral carpal tunnel syndrome 06/19/2022   RSV (respiratory syncytial virus infection) 05/22/2022   Supervision of high risk pregnancy, antepartum 03/08/2022   AMA (advanced maternal age) multigravida 35+, first trimester 02/23/2022   Muscle spasms of neck 02/06/2022   Beta thalassemia trait 10/07/2021   Heart palpitations 10/07/2021    NSVT (nonsustained ventricular tachycardia) ([place]) 10/07/2021   Pre-diabetes 07/22/2021   Vitamin D deficiency 07/22/2021   Mixed hyperlipidemia 07/22/2021   BMI 36.0-36.9,adult 07/14/2021   Family history of breast cancer in sister 10/22/2019   H/O Graves' disease 09/05/2016   Mass of breast 04/21/2014   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   REFERRING DIAG: [id number] (ICD-10-CM) - Pelvic pain affecting pregnancy in second trimester, antepartum    THERAPY DIAG:  Muscle weakness (generalized)   Pelvic pain affecting pregnancy in second trimester, antepartum   Bilateral hip pain   Rationale for Evaluation and Treatment: Rehabilitation   ONSET DATE: 1/23   SUBJECTIVE:  SUBJECTIVE STATEMENT: I feel good. Yesterday I had a little pain in the lower abdomen. I put my bel on and it helped.  My husband has used the sheet to compress my pelvis   PAIN:  Are you having pain? Yes NPRS scale: 0/10 for 06/27/22 Pain location:  bilateral hips and lower abdomen   Pain type: aching and heaviness Pain description: intermittent    Aggravating factors: laying on sides, getting up from the floor and chair Relieving factors: pull up on abdomen, work through the hip pain.    PRECAUTIONS: Other: pregnant   WEIGHT BEARING RESTRICTIONS: No   FALLS:  Has patient fallen in last 6 months? No   LIVING ENVIRONMENT: Lives with: lives with their family   OCCUPATION: work from home   PLOF: Independent   PATIENT GOALS: manage pain   PERTINENT HISTORY:  Postablative hypothyroidism; Mass of breast; H/O Graves' disease    BOWEL MOVEMENT: No issues with bowel movements   URINATION: no issues with urination.  Pain with urination: No Fully empty bladder: Yes: sometimes takes awhile to start the  stream Stream: Strong Urgency: No Frequency: frequent due to pregnancy Leakage: Coughing and Sneezing     INTERCOURSE: No pain or issues with intercourse   PREGNANCY: Currently pregnant Yes: first child  due on 10/11/22         OBJECTIVE:  PATIENT SURVEYS:  Patient limited with sitting, getting up from a chair and from the floor by 25% due to pain as per therapist discretion     COGNITION: Overall cognitive status: Within functional limits for tasks assessed                          SENSATION: Light touch: Appears intact Proprioception: Appears intact     POSTURE:  pregnancy posture   PELVIC ALIGNMENT: Pelvis in correct alignment   LUMBARAROM/PROM:Full Lumbar ROM       LOWER EXTREMITY ROM:   Passive ROM Right eval Left eval  Hip external rotation 45 45    LOWER EXTREMITY MMT:   MMT Right eval Left eval  Hip abduction 4/5 4/5    PALPATION:   General  along the hips and lumbar paraspinals                   TODAY'S TREATMENT:    07/11/22 Exercises: Stretches/mobility: Strengthening: Step up on 6 inch step holding 5# 10x each side Walking 20 feet holding 5# each side working on stability and lower abdominal contraction Side step up for 4 inch 10x each side with posterior pelvic tilt Leaning on counter lifting opposite arm and leg with keeping spinal neutral Sitting hip abduction with red band to strengthen the SI stabilizers Therapeutic activities: Functional strengthening activities: Standing with pelvic tilt posteriorly to reduce strain on abdomen.  Educated patient on correct perineal massage at 35 weeks to improve the pliability of the perineal tissue to reduce tearing Carrying her 37 year old granddaughter equal time on both sides and engaging the core.     06/27/22 Manual: Soft tissue mobilization: Manual work to the lower abdomen to release the tissue Neuromuscular re-education: Down training: Laying in reclined position with diaphragmatic  breathing to relax the pelvic floor and reduce the pressure on the pelvic floor Quadruped compress the SI joint or use a sheet Exercises: Stretches/mobility: Stretch bilateral hip external and internal rotation with therapist assistance Quadruped with hips internally rotated and rock back and forth, elbows on pummel  Strengthening: Quadruped with TA engagement, elbows on pummel Quadruped fire hydrant 5x each side with TA engagement but had increased pain Sitting knee flexion with red band 10x each leg with gluteal squeeze Sit to stand with pelvic floor and gluteal squeeze 15x   PATIENT EDUCATION:  06/27/22 Education details: Access Code: [email or web address], ways to compress the SI joint in quadruped with hands or sheet Person educated: Patient Education method: Explanation, Demonstration, Tactile cues, Verbal cues, and Handouts Education comprehension: verbalized understanding, returned demonstration, verbal cues required, tactile cues required, and needs further education   HOME EXERCISE PROGRAM: 06/27/22 Access Code: [email or web address] URL: [email or web address] Date: 06/27/2022 Prepared by: Earlie Counts   Program Notes massage the lower abdomen while laying in reclined position   Exercises - Seated Piriformis Stretch  - 1 x daily - 7 x weekly - 1 sets - 2 reps - 30 sec hold - Seated Hamstring Stretch  - 1 x daily - 7 x weekly - 1 sets - 2 reps - 30 sec hold - Side Lunge Adductor Stretch  - 1 x daily - 7 x weekly - 1 sets - 2 reps - 30 sec hold - Standing Hip Flexor Stretch  - 1 x daily - 7 x weekly - 1 sets - 2 reps - 30 sec hold - Reclined Diaphragmatic Breathing  - 1 x daily - 7 x weekly - 1 sets - 10 reps - Quadruped Rocking Slow  - 1 x daily - 7 x weekly - 1 sets - 10 reps - Quadruped Transversus Abdominis Bracing  - 1 x daily - 7 x weekly - 1 sets - 10 reps - Seated Hamstring Curl with Anchored Resistance  - 1 x daily - 3 x weekly - 1 sets - 10 reps - Sit to Stand Without Arm  Support  - 1 x daily - 3 x weekly - 1 sets - 15 reps   ASSESSMENT:   CLINICAL IMPRESSION: Patient is a 42 y.o. female who was seen today for physical therapy treatment for pelvic pain during pregnancy.  Patient has less pain in the back and abdomen when she goes into posterior tilt. She is learning how to carry her 39 year old grand daughter and when she has her infant to reduce strain on back. Patient understands how to go up and down stairs without straining her hips or back.  Patient will benefit from skilled therapy to assist with pain management and correct body mechanics to reduce her pain during her pregnancy.     OBJECTIVE IMPAIRMENTS: decreased activity tolerance, decreased endurance, decreased strength, and pain.    ACTIVITY LIMITATIONS: sitting, standing, squatting, transfers, bed mobility, and continence   PARTICIPATION LIMITATIONS: driving and occupation   PERSONAL FACTORS: 1-2 comorbidities: Postablative hypothyroidism; Mass of breast; H/O Graves' disease   are also affecting patient's functional outcome.    REHAB POTENTIAL: Excellent   CLINICAL DECISION MAKING: Stable/uncomplicated   EVALUATION COMPLEXITY: Low     GOALS: Goals reviewed with patient? Yes   SHORT TERM GOALS: Target date: 07/11/22   Patient independent in putting her abdominal brace on.  Baseline: not educated yet Goal status: Met 06/27/22   2.  Patient is independent with hip stretches.  Baseline: not educated yet Goal status: Met 06/27/22     LONG TERM GOALS: Target date: 09/05/22   Patient is independent with advanced HEP for general strength.  Baseline: Not educated yet Goal status: INITIAL   2.  Patient independent with pain management during her pregnancy.  Baseline: Not educated yet Goal status: INITIAL   3.  Patient understands correct body mechanics with daily tasks to reduce her pain.  Baseline: not educated yet Goal status: INITIAL   4.  Patient reports pain has not gone above 5/10  due to using her abdominal brace and correct mechanics.  Baseline: pain level 8/10 Goal status: INITIAL     PLAN:   PT FREQUENCY: 1x/week   PT DURATION: 12 weeks   PLANNED INTERVENTIONS: Therapeutic exercises, Therapeutic activity, Neuromuscular re-education, Patient/Family education, Dry Needling, Cryotherapy, Moist heat, Taping, and Manual therapy   PLAN FOR NEXT SESSION: SI stabilization, breath work for giving birth   Earlie Counts, [place] 07/11/22 9:32 AM

## 2022-07-18 ENCOUNTER — Ambulatory Visit: Payer: BLUE CROSS/BLUE SHIELD | Admitting: Physical Therapy

## 2022-07-18 ENCOUNTER — Encounter (HOSPITAL_BASED_OUTPATIENT_CLINIC_OR_DEPARTMENT_OTHER): Payer: Commercial Managed Care - HMO | Admitting: Obstetrics & Gynecology

## 2022-07-18 ENCOUNTER — Encounter: Payer: Self-pay | Admitting: Physical Therapy

## 2022-07-18 DIAGNOSIS — R102 Pelvic and perineal pain: Secondary | ICD-10-CM | POA: Diagnosis not present

## 2022-07-18 DIAGNOSIS — M6281 Muscle weakness (generalized): Secondary | ICD-10-CM | POA: Diagnosis not present

## 2022-07-18 DIAGNOSIS — O26892 Other specified pregnancy related conditions, second trimester: Secondary | ICD-10-CM

## 2022-07-18 DIAGNOSIS — M25551 Pain in right hip: Secondary | ICD-10-CM

## 2022-07-18 DIAGNOSIS — M25552 Pain in left hip: Secondary | ICD-10-CM | POA: Diagnosis not present

## 2022-07-18 NOTE — Therapy (Signed)
OUTPATIENT PHYSICAL THERAPY TREATMENT NOTE   Patient Name: [name] Curry MRN: [id number] DOB:March 05, 1981, 42 y.o., female Today's Date: 07/18/2022  PCP: Orma Render, NP   REFERRING PROVIDER: Elvera Maria, CNM     END OF SESSION:   PT End of Session - 07/18/22 0935     Visit Number 4    Date for PT Re-Evaluation 09/05/22    Authorization Time Period 06/13/2022-08/12/2022    Authorization - Visit Number 4    Authorization - Number of Visits 10    PT Start Time 0930    PT Stop Time [id number]    PT Time Calculation (min) 45 min    Activity Tolerance Patient tolerated treatment well;No increased pain    Behavior During Therapy [facility] for tasks assessed/performed             Past Medical History:  Diagnosis Date   Anemia    Anxiety    Asthma    Beta thalassemia trait    Depression    Graves disease    Hypothyroidism 06/30/2016   Suicidal behavior 08/05/2011   Broke a light bulb and tried to cut her wrists    Thyroid disease    was hyperthyroid, had radiaton in April 2015, now hyperthyroid   Past Surgical History:  Procedure Laterality Date   COLPOSCOPY     EXCISIONAL HEMORRHOIDECTOMY  1996   HYDRADENITIS EXCISION Left 12/13/2021   Procedure: EXCISION HIDRADENITIS LEFT AXILLA;  Surgeon: Coralie Keens, MD;  Location: [place];  Service: General;  Laterality: Left;   THYROIDECTOMY N/A 06/30/2016   Procedure: TOTAL THYROIDECTOMY;  Surgeon: Armandina Gemma, MD;  Location: [place];  Service: General;  Laterality: N/A;   Patient Active Problem List   Diagnosis Date Noted   Bilateral carpal tunnel syndrome 06/19/2022   RSV (respiratory syncytial virus infection) 05/22/2022   Supervision of high risk pregnancy, antepartum 03/08/2022   AMA (advanced maternal age) multigravida 35+, first trimester 02/23/2022   Muscle spasms of neck 02/06/2022   Beta thalassemia trait 10/07/2021   Heart palpitations 10/07/2021   NSVT (nonsustained ventricular tachycardia)  ([place]) 10/07/2021   Pre-diabetes 07/22/2021   Vitamin D deficiency 07/22/2021   Mixed hyperlipidemia 07/22/2021   BMI 36.0-36.9,adult 07/14/2021   Family history of breast cancer in sister 10/22/2019   H/O Graves' disease 09/05/2016   Mass of breast 04/21/2014   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 I wore my belly band all day yesterday and it helped. Heaviness goes away after walking.    PAIN:  Are you having pain? Yes NPRS scale: 0/10 for 06/27/22 Pain location:  bilateral hips and lower abdomen   Pain type: aching and heaviness Pain description: intermittent    Aggravating factors: laying on sides, getting up from the floor and chair Relieving factors: pull up on abdomen, work through the hip pain.    PRECAUTIONS: Other: pregnant   WEIGHT BEARING RESTRICTIONS: No   FALLS:  Has patient fallen in last 6 months? No   LIVING ENVIRONMENT: Lives with: lives with their family   OCCUPATION: work from home   PLOF: Independent   PATIENT GOALS: manage pain   PERTINENT HISTORY:  Postablative hypothyroidism; Mass of breast; H/O Graves' disease    BOWEL MOVEMENT: No issues with bowel movements   URINATION: no issues with urination.  Pain with urination: No Fully empty bladder: Yes: sometimes takes awhile to start the stream Stream: Strong Urgency: No Frequency: frequent due to  pregnancy Leakage: Coughing and Sneezing     INTERCOURSE: No pain or issues with intercourse   PREGNANCY: Currently pregnant Yes: first child  due on 10/11/22         OBJECTIVE:  PATIENT SURVEYS:  Patient limited with sitting, getting up from a chair and from the floor by 25% due to pain as per therapist discretion     COGNITION: Overall cognitive status: Within functional limits for tasks assessed                          SENSATION: Light touch: Appears intact Proprioception: Appears intact     POSTURE:  pregnancy posture   PELVIC ALIGNMENT: Pelvis in correct alignment   LUMBARAROM/PROM:Full Lumbar ROM       LOWER EXTREMITY ROM:   Passive ROM Right eval Left eval  Hip external rotation 45 45    LOWER EXTREMITY MMT:   MMT Right eval Left eval Right/left 07/18/22  Hip abduction 4/5 4/5 4/5    PALPATION:   General  along the hips and lumbar paraspinals                   TODAY'S TREATMENT:    07/18/22 Exercises: Strengthening: Sitting knee flexion red band 2x10 with ball squeeze Sitting hip abduction with red band to strengthen the SI stabilizers Side step up for 4 inch 10x each side with posterior pelvic tilt Sitting with ball between knees and glide knees back and forth Sit to stand holding ball between hands and breath out when standing 10x Sitting with hands in front then bring hand out to side to stretch pecs and add rotation to thoracic.  Leaning on counter lifting opposite arm and leg with keeping spinal neutral    07/11/22 Exercises: Stretches/mobility: Strengthening: Step up on 6 inch step holding 5# 10x each side Walking 20 feet holding 5# each side working on stability and lower abdominal contraction Side step up for 4 inch 10x each side with posterior pelvic tilt Leaning on counter lifting opposite arm and leg with keeping spinal neutral Sitting hip abduction with red band to strengthen the SI stabilizers Therapeutic  activities: Functional strengthening activities: Standing with pelvic tilt posteriorly to reduce strain on abdomen.  Educated patient on correct perineal massage at 35 weeks to improve the pliability of the perineal tissue to reduce  tearing Carrying her 55 year old granddaughter equal time on both sides and engaging the core.      06/27/22 Manual: Soft tissue mobilization: Manual work to the lower abdomen to release the tissue Neuromuscular re-education: Down training: Laying in reclined position with diaphragmatic breathing to relax the pelvic floor and reduce the pressure on the pelvic floor Quadruped compress the SI joint or use a sheet Exercises: Stretches/mobility: Stretch bilateral hip external and internal rotation with therapist assistance Quadruped with hips internally rotated and rock back and forth, elbows on pummel Strengthening: Quadruped with TA engagement, elbows on pummel Quadruped fire hydrant 5x each side with TA engagement but had increased pain Sitting knee flexion with red band 10x each leg with gluteal squeeze Sit to stand with pelvic floor and gluteal squeeze 15x   PATIENT EDUCATION:  06/27/22 Education details: Access Code: [email or web address], ways to compress the SI joint in quadruped with hands or sheet Person educated: Patient Education method: Explanation, Demonstration, Tactile cues, Verbal cues, and Handouts Education comprehension: verbalized understanding, returned demonstration, verbal cues required, tactile cues required, and needs further education   HOME EXERCISE PROGRAM: 06/27/22 Access Code: [email or web address] URL: [email or web address] Date: 06/27/2022 Prepared by: Earlie Counts   Program Notes massage the lower abdomen while laying in reclined position   Exercises - Seated Piriformis Stretch  - 1 x daily - 7 x weekly - 1 sets - 2 reps - 30 sec hold - Seated Hamstring Stretch  - 1 x daily - 7 x weekly - 1 sets - 2 reps - 30 sec hold - Side Lunge  Adductor Stretch  - 1 x daily - 7 x weekly - 1 sets - 2 reps - 30 sec hold - Standing Hip Flexor Stretch  - 1 x daily - 7 x weekly - 1 sets - 2 reps - 30 sec hold - Reclined Diaphragmatic Breathing  - 1 x daily - 7 x weekly - 1 sets - 10 reps - Quadruped Rocking Slow  - 1 x daily - 7 x weekly - 1 sets - 10 reps - Quadruped Transversus Abdominis Bracing  - 1 x daily - 7 x weekly - 1 sets - 10 reps - Seated Hamstring Curl with Anchored Resistance  - 1 x daily - 3 x weekly - 1 sets - 10 reps - Sit to Stand Without Arm Support  - 1 x daily - 3 x weekly - 1 sets - 15 reps   ASSESSMENT:   CLINICAL IMPRESSION: Patient is a 42 y.o. female who was seen today for physical therapy treatment for pelvic pain during pregnancy.  Patient feels better after exercising and reduction of pain. She is learning ways to keep her strength as her pregnancy progresses. Her abdominal has with pain management. She has difficulty with her carpal tunnel so she is to be careful of exercises that may put pressure on her wrists.  She does better with going from sit to stand with breathing out to make it easier. Patient will benefit from skilled therapy to assist with pain management and correct body mechanics to reduce her pain during her pregnancy.     OBJECTIVE IMPAIRMENTS: decreased activity tolerance, decreased endurance, decreased strength, and pain.    ACTIVITY LIMITATIONS: sitting, standing, squatting, transfers, bed mobility, and continence   PARTICIPATION LIMITATIONS: driving and occupation   PERSONAL FACTORS: 1-2 comorbidities: Postablative hypothyroidism; Mass of breast; H/O Graves' disease   are also affecting patient's functional outcome.    REHAB POTENTIAL: Excellent   CLINICAL  DECISION MAKING: Stable/uncomplicated   EVALUATION COMPLEXITY: Low     GOALS: Goals reviewed with patient? Yes   SHORT TERM GOALS: Target date: 07/11/22   Patient independent in putting her abdominal brace on.  Baseline: not  educated yet Goal status: Met 06/27/22   2.  Patient is independent with hip stretches.  Baseline: not educated yet Goal status: Met 06/27/22     LONG TERM GOALS: Target date: 09/05/22   Patient is independent with advanced HEP for general strength.  Baseline: Not educated yet Goal status: ongoing 07/18/22   2.  Patient independent with pain management during her pregnancy.  Baseline: Not educated yet Goal status: Ongoing 07/18/22   3.  Patient understands correct body mechanics with daily tasks to reduce her pain.  Baseline: not educated yet Goal status: ongoing 07/18/22   4.  Patient reports pain has not gone above 5/10 due to using her abdominal brace and correct mechanics.  Baseline: pain level 8/10 Goal status: Ongoing 07/18/22     PLAN:   PT FREQUENCY: 1x/week   PT DURATION: 12 weeks   PLANNED INTERVENTIONS: Therapeutic exercises, Therapeutic activity, Neuromuscular re-education, Patient/Family education, Dry Needling, Cryotherapy, Moist heat, Taping, and Manual therapy   PLAN FOR NEXT SESSION: SI stabilization, breath work for giving birth  Earlie Counts, PT 07/18/22 10:23 AM

## 2022-07-21 ENCOUNTER — Encounter (HOSPITAL_BASED_OUTPATIENT_CLINIC_OR_DEPARTMENT_OTHER): Payer: Commercial Managed Care - HMO | Admitting: Obstetrics & Gynecology

## 2022-07-21 DIAGNOSIS — Z419 Encounter for procedure for purposes other than remedying health state, unspecified: Secondary | ICD-10-CM | POA: Diagnosis not present

## 2022-07-24 ENCOUNTER — Encounter (HOSPITAL_BASED_OUTPATIENT_CLINIC_OR_DEPARTMENT_OTHER): Payer: Commercial Managed Care - HMO | Admitting: Obstetrics & Gynecology

## 2022-07-25 ENCOUNTER — Encounter: Payer: Self-pay | Admitting: Physical Therapy

## 2022-07-25 ENCOUNTER — Encounter: Payer: Medicaid Other | Attending: Advanced Practice Midwife | Admitting: Physical Therapy

## 2022-07-25 DIAGNOSIS — M6281 Muscle weakness (generalized): Secondary | ICD-10-CM | POA: Insufficient documentation

## 2022-07-25 DIAGNOSIS — R102 Pelvic and perineal pain: Secondary | ICD-10-CM | POA: Insufficient documentation

## 2022-07-25 DIAGNOSIS — O26892 Other specified pregnancy related conditions, second trimester: Secondary | ICD-10-CM | POA: Diagnosis not present

## 2022-07-25 DIAGNOSIS — M25552 Pain in left hip: Secondary | ICD-10-CM | POA: Diagnosis not present

## 2022-07-25 DIAGNOSIS — M25551 Pain in right hip: Secondary | ICD-10-CM | POA: Insufficient documentation

## 2022-07-25 NOTE — Therapy (Signed)
OUTPATIENT PHYSICAL THERAPY TREATMENT NOTE   Patient Name: [name] Curry MRN: [id number] DOB:03-30-1981, 42 y.o., female Today's Date: 07/25/2022  PCP: Orma Render, NP    REFERRING PROVIDER: Elvera Maria, CNM    END OF SESSION:   PT End of Session - 07/25/22 0836     Visit Number 5    Date for PT Re-Evaluation 09/05/22    Authorization Type [place]; [employer]    Authorization Time Period 06/13/2022-08/12/2022    Authorization - Visit Number 5    Authorization - Number of Visits 10    PT Start Time 0830    PT Stop Time 0915    PT Time Calculation (min) 45 min    Activity Tolerance Patient tolerated treatment well;No increased pain    Behavior During Therapy [facility] for tasks assessed/performed             Past Medical History:  Diagnosis Date   Anemia    Anxiety    Asthma    Beta thalassemia trait    Depression    Graves disease    Hypothyroidism 06/30/2016   Suicidal behavior 08/05/2011   Broke a light bulb and tried to cut her wrists    Thyroid disease    was hyperthyroid, had radiaton in April 2015, now hyperthyroid   Past Surgical History:  Procedure Laterality Date   COLPOSCOPY     EXCISIONAL HEMORRHOIDECTOMY  1996   HYDRADENITIS EXCISION Left 12/13/2021   Procedure: EXCISION HIDRADENITIS LEFT AXILLA;  Surgeon: Coralie Keens, MD;  Location: [place];  Service: General;  Laterality: Left;   THYROIDECTOMY N/A 06/30/2016   Procedure: TOTAL THYROIDECTOMY;  Surgeon: Armandina Gemma, MD;  Location: [place];  Service: General;  Laterality: N/A;   Patient Active Problem List   Diagnosis Date Noted   Bilateral carpal tunnel syndrome 06/19/2022   RSV (respiratory syncytial virus infection) 05/22/2022   Supervision of high risk pregnancy, antepartum 03/08/2022   AMA (advanced maternal age) multigravida 35+, first trimester 02/23/2022   Muscle spasms of neck 02/06/2022   Beta thalassemia trait 10/07/2021   Heart palpitations 10/07/2021   NSVT  (nonsustained ventricular tachycardia) ([place]) 10/07/2021   Pre-diabetes 07/22/2021   Vitamin D deficiency 07/22/2021   Mixed hyperlipidemia 07/22/2021   BMI 36.0-36.9,adult 07/14/2021   Family history of breast cancer in sister 10/22/2019   H/O Graves' disease 09/05/2016   Mass of breast 04/21/2014   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 I have less pressure. I had a Braxton Hicks contraction.    PAIN:  Are you having pain? Yes NPRS scale: 0/10 for 06/27/22 Pain location:  bilateral hips and lower abdomen   Pain type: aching and heaviness Pain description: intermittent    Aggravating factors: laying on sides, getting up from the floor and chair Relieving factors: pull up on abdomen, work through the hip pain.    PRECAUTIONS: Other: pregnant   WEIGHT BEARING RESTRICTIONS: No   FALLS:  Has patient fallen in last 6 months? No   LIVING ENVIRONMENT: Lives with: lives with their family   OCCUPATION: work from home   PLOF: Independent   PATIENT GOALS: manage pain   PERTINENT HISTORY:  Postablative hypothyroidism; Mass of breast; H/O Graves' disease    BOWEL MOVEMENT: No issues with bowel movements   URINATION: no issues with urination.  Pain with urination: No Fully empty bladder: Yes: sometimes takes awhile to start the stream Stream: Strong Urgency: No Frequency: frequent due to pregnancy Leakage:  Coughing and Sneezing     INTERCOURSE: No pain or issues with intercourse   PREGNANCY: Currently pregnant Yes: first child  due on 10/11/22         OBJECTIVE:  PATIENT SURVEYS:  Patient limited with sitting, getting up from a chair and from the floor by 25% due to pain as per therapist discretion     COGNITION: Overall cognitive status: Within functional limits for tasks assessed                          SENSATION: Light touch: Appears intact Proprioception: Appears intact     POSTURE:  pregnancy posture   PELVIC ALIGNMENT: Pelvis in correct alignment   LUMBARAROM/PROM:Full Lumbar ROM       LOWER EXTREMITY ROM:   Passive ROM Right eval Left eval  Hip external rotation 45 45    LOWER EXTREMITY MMT:   MMT Right eval Left eval Right/left 07/18/22 Right/Left 07/25/22  Hip abduction 4/5 4/5 4/5 4/5    PALPATION:   General  along the hips and lumbar paraspinals                   TODAY'S TREATMENT:    07/25/22 Exercises: Strengthening: Hamstring curls with red band sitting 20x each Sitting hip abduction with red band 30x Sitting with ball squeeze moving knees forward and back 15x Ball squeeze with knees offset Sitting diagonals with ball squeeze Therapeutic activities: Functional strengthening activities: Educated patient on different positions for giving birth. Ways to use the bed, sitting, offset positions, squatting, working with hip internal rotation.   07/18/22 Exercises: Strengthening: Sitting knee flexion red band 2x10 with ball squeeze Sitting hip abduction with red band to strengthen the SI stabilizers Side step up for 4 inch 10x each side with posterior pelvic tilt Sitting with ball between knees and glide knees back and forth Sit to stand holding ball between hands and breath out when standing 10x Sitting with hands in front then bring hand out to side to stretch pecs and add rotation to thoracic.  Leaning on counter lifting opposite arm and leg  with keeping spinal neutral     07/11/22 Exercises: Stretches/mobility: Strengthening: Step up on 6 inch step holding 5# 10x each side Walking 20 feet holding 5# each side working on stability and lower abdominal contraction Side step up for 4 inch 10x each side with posterior pelvic  tilt Leaning on counter lifting opposite arm and leg with keeping spinal neutral Sitting hip abduction with red band to strengthen the SI stabilizers Therapeutic activities: Functional strengthening activities: Standing with pelvic tilt posteriorly to reduce strain on abdomen.  Educated patient on correct perineal massage at 35 weeks to improve the pliability of the perineal tissue to reduce tearing Carrying her 94 year old granddaughter equal time on both sides and engaging the core.        PATIENT EDUCATION:  07/25/22 Education details: Access Code: [email or web address], ways to compress the SI joint in quadruped with hands or sheet, different birthing positions Person educated: Patient Education method: Explanation, Demonstration Education comprehension: verbalized understanding, returned demonstration   HOME EXERCISE PROGRAM: 07/25/22 Access Code: [email or web address] URL: [email or web address] Date: 07/25/2022 Prepared by: Earlie Counts  Program Notes massage the lower abdomen while laying in reclined position  Exercises - Seated Piriformis Stretch  - 1 x daily - 7 x weekly - 1 sets - 2 reps - 30 sec hold - Seated Hamstring Stretch  - 1 x daily - 7 x weekly - 1 sets - 2 reps - 30 sec hold - Side Lunge Adductor Stretch  - 1 x daily - 7 x weekly - 1 sets - 2 reps - 30 sec hold - Standing Hip Flexor Stretch  - 1 x daily - 7 x weekly - 1 sets - 2 reps - 30 sec hold - Reclined Diaphragmatic Breathing  - 1 x daily - 7 x weekly - 1 sets - 10 reps - Seated Hamstring Curl with Anchored Resistance  - 1 x daily - 3 x weekly - 1 sets - 10 reps - Sit to Stand Without Arm Support  - 1 x daily - 3 x weekly - 1 sets - 15  reps - Bird Dog on Counter  - 1 x daily - 3 x weekly - 2 sets - 10 reps - Seated Hip Abduction with Resistance  - 1 x daily - 3 x weekly - 2 sets - 10 reps - Seated Diagonal Lift  - 1 x daily - 2 x weekly - 1 sets - 10 reps - Seated Hip Adduction Squeeze with Ball  - 1 x daily - 1 x weekly - 1 sets - 10 reps   ASSESSMENT:   CLINICAL IMPRESSION: Patient is a 42 y.o. female who was seen today for physical therapy treatment for pelvic pain during pregnancy.  Patient is not having as much pain and pressure.  She will use her SI belt when she is uncomfortable. She understands different birthing positions and when to use them for the different stages of labor. Her pelvis in correct alignment. She understands different ways to manage her pain and what exercises help her. Patient has met her goals and ready for discharge.   OBJECTIVE IMPAIRMENTS: decreased activity tolerance, decreased endurance, decreased strength, and pain.    ACTIVITY LIMITATIONS: sitting, standing, squatting, transfers, bed mobility, and continence   PARTICIPATION LIMITATIONS: driving and occupation   PERSONAL FACTORS: 1-2 comorbidities: Postablative hypothyroidism; Mass of breast; H/O Graves' disease   are also affecting patient's functional outcome.    REHAB POTENTIAL: Excellent   CLINICAL DECISION MAKING: Stable/uncomplicated   EVALUATION COMPLEXITY: Low     GOALS: Goals reviewed with patient? Yes   SHORT TERM GOALS: Target date: 07/11/22   Patient independent in putting her abdominal brace on.  Baseline: not educated yet Goal status: Met 06/27/22   2.  Patient is independent with hip stretches.  Baseline: not educated yet Goal status: Met 06/27/22     LONG TERM GOALS: Target date: 09/05/22   Patient is independent with advanced HEP for general strength.  Baseline: Not educated yet Goal status: Met 07/25/22   2.  Patient independent with pain management during her pregnancy.  Baseline: Not educated yet Goal  status: Met 07/25/22   3.  Patient understands correct body mechanics with daily tasks to reduce her pain.  Baseline: not educated yet Goal status: Met 07/25/22   4.  Patient reports pain has not gone above 5/10 due to using her abdominal brace and correct mechanics.  Baseline: pain level 3/10 in morning and middle of the night Goal status: Met 07/25/22     PLAN: Discharge to [place], PT 07/25/22 9:30 AM  PHYSICAL THERAPY DISCHARGE SUMMARY  Visits from Start of Care: 5  Current functional level related to goals / functional outcomes: See above.    Remaining deficits: See above.    Education / Equipment: HEP   Patient agrees to discharge. Patient goals were met. Patient is being discharged due to meeting the stated rehab goals. Thank you for the referral. Earlie Counts, PT 07/25/22 9:30 AM

## 2022-07-27 ENCOUNTER — Ambulatory Visit (HOSPITAL_BASED_OUTPATIENT_CLINIC_OR_DEPARTMENT_OTHER): Payer: BLUE CROSS/BLUE SHIELD | Admitting: Obstetrics & Gynecology

## 2022-07-27 ENCOUNTER — Encounter (HOSPITAL_BASED_OUTPATIENT_CLINIC_OR_DEPARTMENT_OTHER): Payer: Commercial Managed Care - HMO | Admitting: Obstetrics & Gynecology

## 2022-07-27 VITALS — BP 126/69 | HR 96 | Wt 213.4 lb

## 2022-07-27 DIAGNOSIS — Z8639 Personal history of other endocrine, nutritional and metabolic disease: Secondary | ICD-10-CM

## 2022-07-27 DIAGNOSIS — D563 Thalassemia minor: Secondary | ICD-10-CM

## 2022-07-27 DIAGNOSIS — O09521 Supervision of elderly multigravida, first trimester: Secondary | ICD-10-CM

## 2022-07-27 DIAGNOSIS — Z6836 Body mass index (BMI) 36.0-36.9, adult: Secondary | ICD-10-CM

## 2022-07-27 DIAGNOSIS — Z3A29 29 weeks gestation of pregnancy: Secondary | ICD-10-CM

## 2022-07-27 DIAGNOSIS — G5601 Carpal tunnel syndrome, right upper limb: Secondary | ICD-10-CM

## 2022-07-27 DIAGNOSIS — O0990 Supervision of high risk pregnancy, unspecified, unspecified trimester: Secondary | ICD-10-CM

## 2022-07-27 DIAGNOSIS — Z880 Allergy status to penicillin: Secondary | ICD-10-CM

## 2022-07-27 DIAGNOSIS — R7303 Prediabetes: Secondary | ICD-10-CM

## 2022-07-27 NOTE — Progress Notes (Signed)
   PRENATAL VISIT NOTE  Subjective:  [name] Curry is a 42 y.o. G1P0 at 63w6dbeing seen today for ongoing prenatal care.  She is currently monitored for the following issues for this high-risk pregnancy and has Postablative hypothyroidism; Mass of breast; H/O Graves' disease; Family history of breast cancer in sister; BMI 36.0-36.9,adult; Pre-diabetes; Vitamin D deficiency; Mixed hyperlipidemia; Beta thalassemia trait; Heart palpitations; NSVT (nonsustained ventricular tachycardia) ([place]; Muscle spasms of neck; AMA (advanced maternal age) multigravida 376+ first trimester; Supervision of high risk pregnancy, antepartum; RSV (respiratory syncytial virus infection); Bilateral carpal tunnel syndrome; and Penicillin allergy on their problem list.  Patient reports  complaint of numbness, tingling, pain in hands.  She has seen Dr. XErlinda Hong  Injections offered.  She felt uncomfortable doing this.  We discussed the safety of steroids.  Using splits, ice .  Contractions: Irregular. Vag. Bleeding: None.  Movement: Present. Denies leaking of fluid.   The following portions of the patient's history were reviewed and updated as appropriate: allergies, current medications, past family history, past medical history, past social history, past surgical history and problem list.   Objective:   Vitals:   07/27/22 1133  BP: 126/69  Pulse: 96  Weight: 213 lb 6.4 oz (96.8 kg)    Fetal Status: Fetal Heart Rate (bpm): 150   Movement: Present     General:  Alert, oriented and cooperative. Patient is in no acute distress.  Skin: Skin is warm and dry. No rash noted.   Cardiovascular: Normal heart rate noted  Respiratory: Normal respiratory effort, no problems with respiration noted  Abdomen: Soft, gravid, appropriate for gestational age.  Pain/Pressure: Present     Pelvic: Cervical exam deferred        Extremities: Normal range of motion.  Edema: Trace  Mental Status: Normal mood and affect. Normal behavior. Normal  judgment and thought content.   Assessment and Plan:  Pregnancy: G1P0 at 233w6d. Supervision of high risk pregnancy, antepartum - on PNV and baby ASA - recheck 2 wees  2. H/O Graves' disease - followed by Dr. GhCruzita Lederer Now on 125 levothyroxine. - having every 4 week growth scan.  3. BMI 36.0-36.9,adult  4. Pre-diabetes - hba1C was normal, 2hr GTT normal  5. Beta thalassemia trait - FOB testing negative  6. AMA (advanced maternal age) multigravida 3580+first trimester  7. Carpal tunnel syndrome of right wrist - has seen Dr. XuErlinda Hong Preterm labor symptoms and general obstetric precautions including but not limited to vaginal bleeding, contractions, leaking of fluid and fetal movement were reviewed in detail with the patient. Please refer to After Visit Summary for other counseling recommendations.   Return in about 2 weeks (around 08/10/2022).  Future Appointments  Date Time Provider [place]3/04/2023  8:45 AM [facility]  9:00 AM [facility]/04/2023 10:00 AM [facility] ENDO/NEURO LAB [facility] None  08/11/2022 10:55 AM MiMegan SalonMD [facility]  08/25/2022  9:35 AM MiMegan SalonMD [facility]  09/08/2022  9:35 AM MiMegan SalonMD [facility]  09/15/2022  9:55 AM MiMegan SalonMD [facility]  09/22/2022 10:35 AM MiMegan SalonMD [facility]  09/29/2022 10:15 AM MiMegan SalonMD [facility]  01/03/2023  9:00 AM GhPhilemon KingdomMD [facility] None    MaMegan SalonMD

## 2022-07-31 ENCOUNTER — Encounter (HOSPITAL_BASED_OUTPATIENT_CLINIC_OR_DEPARTMENT_OTHER): Payer: Commercial Managed Care - HMO | Admitting: Obstetrics & Gynecology

## 2022-08-01 ENCOUNTER — Ambulatory Visit: Payer: BLUE CROSS/BLUE SHIELD | Admitting: *Deleted

## 2022-08-01 ENCOUNTER — Other Ambulatory Visit (INDEPENDENT_AMBULATORY_CARE_PROVIDER_SITE_OTHER): Payer: BLUE CROSS/BLUE SHIELD

## 2022-08-01 ENCOUNTER — Ambulatory Visit: Payer: BLUE CROSS/BLUE SHIELD | Attending: Obstetrics

## 2022-08-01 ENCOUNTER — Other Ambulatory Visit: Payer: Self-pay | Admitting: *Deleted

## 2022-08-01 VITALS — BP 131/82 | HR 99

## 2022-08-01 DIAGNOSIS — O285 Abnormal chromosomal and genetic finding on antenatal screening of mother: Secondary | ICD-10-CM

## 2022-08-01 DIAGNOSIS — O0990 Supervision of high risk pregnancy, unspecified, unspecified trimester: Secondary | ICD-10-CM | POA: Diagnosis present

## 2022-08-01 DIAGNOSIS — E890 Postprocedural hypothyroidism: Secondary | ICD-10-CM

## 2022-08-01 DIAGNOSIS — Z3A29 29 weeks gestation of pregnancy: Secondary | ICD-10-CM

## 2022-08-01 DIAGNOSIS — O09521 Supervision of elderly multigravida, first trimester: Secondary | ICD-10-CM | POA: Diagnosis present

## 2022-08-01 DIAGNOSIS — O09523 Supervision of elderly multigravida, third trimester: Secondary | ICD-10-CM

## 2022-08-01 DIAGNOSIS — E039 Hypothyroidism, unspecified: Secondary | ICD-10-CM

## 2022-08-01 DIAGNOSIS — O09513 Supervision of elderly primigravida, third trimester: Secondary | ICD-10-CM

## 2022-08-01 DIAGNOSIS — O99283 Endocrine, nutritional and metabolic diseases complicating pregnancy, third trimester: Secondary | ICD-10-CM

## 2022-08-01 DIAGNOSIS — D563 Thalassemia minor: Secondary | ICD-10-CM

## 2022-08-01 LAB — T4, FREE: Free T4: 0.56 ng/dL — ABNORMAL LOW (ref 0.60–1.60)

## 2022-08-01 LAB — TSH: TSH: 3.44 u[IU]/mL (ref 0.35–5.50)

## 2022-08-01 MED ORDER — LEVOTHYROXINE SODIUM 137 MCG PO TABS: 137.0000 ug | ORAL_TABLET | Freq: Every day | ORAL | 5 refills | Status: DC

## 2022-08-03 ENCOUNTER — Ambulatory Visit: Payer: BLUE CROSS/BLUE SHIELD | Admitting: Physical Therapy

## 2022-08-09 ENCOUNTER — Encounter (HOSPITAL_BASED_OUTPATIENT_CLINIC_OR_DEPARTMENT_OTHER): Payer: Commercial Managed Care - HMO | Admitting: Obstetrics & Gynecology

## 2022-08-10 ENCOUNTER — Encounter (HOSPITAL_COMMUNITY): Payer: Self-pay | Admitting: Obstetrics & Gynecology

## 2022-08-10 ENCOUNTER — Inpatient Hospital Stay (HOSPITAL_COMMUNITY)
Admission: AD | Admit: 2022-08-10 | Discharge: 2022-08-10 | Disposition: A | Discharge status: Home / Self Care | Payer: BLUE CROSS/BLUE SHIELD | Attending: Obstetrics & Gynecology | Admitting: Obstetrics & Gynecology

## 2022-08-10 DIAGNOSIS — O09899 Supervision of other high risk pregnancies, unspecified trimester: Secondary | ICD-10-CM

## 2022-08-10 DIAGNOSIS — R109 Unspecified abdominal pain: Secondary | ICD-10-CM

## 2022-08-10 DIAGNOSIS — Z3A31 31 weeks gestation of pregnancy: Secondary | ICD-10-CM | POA: Insufficient documentation

## 2022-08-10 DIAGNOSIS — K529 Noninfective gastroenteritis and colitis, unspecified: Secondary | ICD-10-CM

## 2022-08-10 DIAGNOSIS — R1011 Right upper quadrant pain: Secondary | ICD-10-CM | POA: Diagnosis not present

## 2022-08-10 DIAGNOSIS — R102 Pelvic and perineal pain: Secondary | ICD-10-CM | POA: Diagnosis not present

## 2022-08-10 DIAGNOSIS — O09513 Supervision of elderly primigravida, third trimester: Secondary | ICD-10-CM | POA: Insufficient documentation

## 2022-08-10 DIAGNOSIS — O09893 Supervision of other high risk pregnancies, third trimester: Secondary | ICD-10-CM | POA: Insufficient documentation

## 2022-08-10 DIAGNOSIS — O26893 Other specified pregnancy related conditions, third trimester: Secondary | ICD-10-CM | POA: Insufficient documentation

## 2022-08-10 DIAGNOSIS — R8789 Other abnormal findings in specimens from female genital organs: Secondary | ICD-10-CM

## 2022-08-10 DIAGNOSIS — N898 Other specified noninflammatory disorders of vagina: Secondary | ICD-10-CM

## 2022-08-10 LAB — URINALYSIS, ROUTINE W REFLEX MICROSCOPIC
Bilirubin Urine: NEGATIVE
Glucose, UA: NEGATIVE mg/dL
Ketones, ur: NEGATIVE mg/dL
Leukocytes,Ua: NEGATIVE
Nitrite: NEGATIVE
Protein, ur: 100 mg/dL — AB
Specific Gravity, Urine: 1.024 (ref 1.005–1.030)
pH: 5 (ref 5.0–8.0)

## 2022-08-10 LAB — FETAL FIBRONECTIN: Fetal Fibronectin: POSITIVE — AB

## 2022-08-10 NOTE — MAU Note (Signed)
[name] Curry is a 42 y.o. at [redacted]w[redacted]d here in MAU reporting: been having a lot of pressure down there. Had a big glob of mucous come out today.  Has had a little cramping. Baby moving a lot. No leaking or bleeding.  Had some loose stool this morning. Denies HA, visual changes or epigastric pain. Has had swelling in hands and feet.  Onset of complaint: all wk, got really bad last night Pain score: mild/3  pelvic pressure Vitals:   08/10/22 1355  BP: (!) 142/77  Pulse: 89  Resp: 18  Temp: 99.2 F (37.3 C)  SpO2: 98%     FHT:140 Lab orders placed from triage:  urine
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
## 2022-08-11 ENCOUNTER — Ambulatory Visit (INDEPENDENT_AMBULATORY_CARE_PROVIDER_SITE_OTHER): Payer: BLUE CROSS/BLUE SHIELD | Admitting: Obstetrics & Gynecology

## 2022-08-11 ENCOUNTER — Inpatient Hospital Stay (HOSPITAL_COMMUNITY)
Admission: AD | Admit: 2022-08-11 | Discharge: 2022-08-11 | Disposition: A | Discharge status: Home / Self Care | Payer: BLUE CROSS/BLUE SHIELD | Attending: Obstetrics & Gynecology | Admitting: Obstetrics & Gynecology

## 2022-08-11 ENCOUNTER — Encounter (HOSPITAL_COMMUNITY): Payer: Self-pay | Admitting: Obstetrics & Gynecology

## 2022-08-11 VITALS — BP 132/84 | HR 96 | Ht 63.0 in | Wt 215.8 lb

## 2022-08-11 DIAGNOSIS — O0990 Supervision of high risk pregnancy, unspecified, unspecified trimester: Secondary | ICD-10-CM

## 2022-08-11 DIAGNOSIS — Z3A32 32 weeks gestation of pregnancy: Secondary | ICD-10-CM

## 2022-08-11 DIAGNOSIS — O479 False labor, unspecified: Secondary | ICD-10-CM

## 2022-08-11 DIAGNOSIS — D563 Thalassemia minor: Secondary | ICD-10-CM

## 2022-08-11 DIAGNOSIS — E890 Postprocedural hypothyroidism: Secondary | ICD-10-CM

## 2022-08-11 DIAGNOSIS — O133 Gestational [pregnancy-induced] hypertension without significant proteinuria, third trimester: Secondary | ICD-10-CM | POA: Diagnosis present

## 2022-08-11 DIAGNOSIS — Z8759 Personal history of other complications of pregnancy, childbirth and the puerperium: Secondary | ICD-10-CM | POA: Insufficient documentation

## 2022-08-11 DIAGNOSIS — R7303 Prediabetes: Secondary | ICD-10-CM

## 2022-08-11 DIAGNOSIS — O09523 Supervision of elderly multigravida, third trimester: Secondary | ICD-10-CM

## 2022-08-11 DIAGNOSIS — O4700 False labor before 37 completed weeks of gestation, unspecified trimester: Secondary | ICD-10-CM

## 2022-08-11 DIAGNOSIS — R109 Unspecified abdominal pain: Secondary | ICD-10-CM | POA: Diagnosis not present

## 2022-08-11 DIAGNOSIS — Z3689 Encounter for other specified antenatal screening: Secondary | ICD-10-CM

## 2022-08-11 DIAGNOSIS — Z6836 Body mass index (BMI) 36.0-36.9, adult: Secondary | ICD-10-CM

## 2022-08-11 DIAGNOSIS — Z7982 Long term (current) use of aspirin: Secondary | ICD-10-CM | POA: Insufficient documentation

## 2022-08-11 DIAGNOSIS — O26893 Other specified pregnancy related conditions, third trimester: Secondary | ICD-10-CM | POA: Diagnosis not present

## 2022-08-11 DIAGNOSIS — O139 Gestational [pregnancy-induced] hypertension without significant proteinuria, unspecified trimester: Secondary | ICD-10-CM | POA: Insufficient documentation

## 2022-08-11 DIAGNOSIS — I4729 Other ventricular tachycardia: Secondary | ICD-10-CM

## 2022-08-11 DIAGNOSIS — O0993 Supervision of high risk pregnancy, unspecified, third trimester: Secondary | ICD-10-CM

## 2022-08-11 LAB — CBC
HCT: 36 % (ref 36.0–46.0)
Hemoglobin: 11.7 g/dL — ABNORMAL LOW (ref 12.0–15.0)
MCH: 25.9 pg — ABNORMAL LOW (ref 26.0–34.0)
MCHC: 32.5 g/dL (ref 30.0–36.0)
MCV: 79.8 fL — ABNORMAL LOW (ref 80.0–100.0)
Platelets: 259 10*3/uL (ref 150–400)
RBC: 4.51 MIL/uL (ref 3.87–5.11)
RDW: 15.2 % (ref 11.5–15.5)
WBC: 10.9 10*3/uL — ABNORMAL HIGH (ref 4.0–10.5)
nRBC: 0 % (ref 0.0–0.2)

## 2022-08-11 LAB — COMPREHENSIVE METABOLIC PANEL
ALT: 24 U/L (ref 0–44)
AST: 24 U/L (ref 15–41)
Albumin: 2.8 g/dL — ABNORMAL LOW (ref 3.5–5.0)
Alkaline Phosphatase: 73 U/L (ref 38–126)
Anion gap: 12 (ref 5–15)
BUN: <5 mg/dL — ABNORMAL LOW (ref 6–20)
CO2: 22 mmol/L (ref 22–32)
Calcium: 9.3 mg/dL (ref 8.9–10.3)
Chloride: 103 mmol/L (ref 98–111)
Creatinine, Ser: 0.61 mg/dL (ref 0.44–1.00)
GFR, Estimated: >60 mL/min (ref >60)
Glucose, Bld: 95 mg/dL (ref 70–99)
Potassium: 3.6 mmol/L (ref 3.5–5.1)
Sodium: 137 mmol/L (ref 135–145)
Total Bilirubin: 0.4 mg/dL (ref 0.3–1.2)
Total Protein: 6.2 g/dL — ABNORMAL LOW (ref 6.5–8.1)

## 2022-08-11 LAB — PROTEIN / CREATININE RATIO, URINE
Creatinine, Urine: 242 mg/dL
Protein Creatinine Ratio: 0.14 mg/mg{Cre} (ref 0.00–0.15)
Total Protein, Urine: 33 mg/dL

## 2022-08-11 NOTE — Progress Notes (Unsigned)
   PRENATAL VISIT NOTE  Subjective:  [name] Curry is a 42 y.o. G1P0 at [redacted]w[redacted]d being seen today for ongoing prenatal care.  She is currently monitored for the following issues for this {Blank single:19197::"high-risk","low-risk"} pregnancy and has Postablative hypothyroidism; Mass of breast; H/O Graves' disease; Family history of breast cancer in sister; BMI 36.0-36.9,adult; Pre-diabetes; Vitamin D deficiency; Mixed hyperlipidemia; Beta thalassemia trait; Heart palpitations; NSVT (nonsustained ventricular tachycardia) ([place]); Muscle spasms of neck; AMA (advanced maternal age) multigravida 48+, first trimester; Supervision of high risk pregnancy, antepartum; RSV (respiratory syncytial virus infection); Bilateral carpal tunnel syndrome; and Penicillin allergy on their problem list.  Patient reports {sx:14538}.  Contractions: Irregular. Vag. Bleeding: None.  Movement: Present. Denies leaking of fluid.   The following portions of the patient's history were reviewed and updated as appropriate: allergies, current medications, past family history, past medical history, past social history, past surgical history and problem list.   Objective:   Vitals:   08/11/22 1101  BP: 132/84  Pulse: 96  Weight: 215 lb 12.8 oz (97.9 kg)    Fetal Status: Fetal Heart Rate (bpm): 152   Movement: Present     General:  Alert, oriented and cooperative. Patient is in no acute distress.  Skin: Skin is warm and dry. No rash noted.   Cardiovascular: Normal heart rate noted  Respiratory: Normal respiratory effort, no problems with respiration noted  Abdomen: Soft, gravid, appropriate for gestational age.  Pain/Pressure: Present     Pelvic: {Blank single:19197::"Cervical exam performed in the presence of a chaperone","Cervical exam deferred"}        Extremities: Normal range of motion.  Edema: Trace  Mental Status: Normal mood and affect. Normal behavior. Normal judgment and thought content.   Assessment and Plan:   Pregnancy: G1P0 at [redacted]w[redacted]d There are no diagnoses linked to this encounter. {Blank single:19197::"Term","Preterm"} labor symptoms and general obstetric precautions including but not limited to vaginal bleeding, contractions, leaking of fluid and fetal movement were reviewed in detail with the patient. Please refer to After Visit Summary for other counseling recommendations.   No follow-ups on file.  Future Appointments  Date Time Provider [place]  08/25/2022  9:35 AM Megan Salon, MD [facility]  08/29/2022  1:15 PM [facility] NURSE [facility]  08/29/2022  1:30 PM [facility] US2 [facility]  09/08/2022  9:35 AM Megan Salon, MD [facility]  09/15/2022  9:55 AM Megan Salon, MD [facility]  09/22/2022 10:35 AM Megan Salon, MD [facility]  09/29/2022 10:15 AM Megan Salon, MD [facility]  01/03/2023  9:00 AM Philemon Kingdom, MD [facility] None    Blenda Nicely, RN

## 2022-08-11 NOTE — MAU Note (Signed)
...  [name] Curry is a 42 y.o. at [redacted]w[redacted]d here in MAU reporting: Potential contractions since yesterday. She reports she is unsure what contractions feel like but she reports her entire abdomen tightens, she feels a pain under her left breast, and has an increase in vaginal pressure when she pain occurs. She reports these pains have been every 10 minutes since 1519 today. She reports she also lost more of her mucous plug today and reports it was bloody. Denies LOF. +FM.  Was closed and "soft and thin" yesterday. Had an office visit today but was not checked. GBS collected. Positive FFN yesterday in MAU.  Pain score:  6/10 abdomen 8/10 vagina/pelvis  FHT: 134 initial external Lab orders placed from triage:  none
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
## 2022-08-12 ENCOUNTER — Encounter (HOSPITAL_BASED_OUTPATIENT_CLINIC_OR_DEPARTMENT_OTHER): Payer: Self-pay | Admitting: Obstetrics & Gynecology

## 2022-08-15 ENCOUNTER — Encounter (HOSPITAL_COMMUNITY): Payer: Self-pay | Admitting: Obstetrics & Gynecology

## 2022-08-15 ENCOUNTER — Other Ambulatory Visit: Payer: Self-pay

## 2022-08-15 ENCOUNTER — Inpatient Hospital Stay (HOSPITAL_COMMUNITY)
Admission: AD | Admit: 2022-08-15 | Discharge: 2022-08-15 | Disposition: A | Discharge status: Home / Self Care | Payer: BLUE CROSS/BLUE SHIELD | Attending: Obstetrics & Gynecology | Admitting: Obstetrics & Gynecology

## 2022-08-15 DIAGNOSIS — O133 Gestational [pregnancy-induced] hypertension without significant proteinuria, third trimester: Secondary | ICD-10-CM | POA: Diagnosis not present

## 2022-08-15 DIAGNOSIS — O4703 False labor before 37 completed weeks of gestation, third trimester: Secondary | ICD-10-CM | POA: Diagnosis not present

## 2022-08-15 DIAGNOSIS — O09513 Supervision of elderly primigravida, third trimester: Secondary | ICD-10-CM | POA: Insufficient documentation

## 2022-08-15 DIAGNOSIS — Z3A32 32 weeks gestation of pregnancy: Secondary | ICD-10-CM | POA: Insufficient documentation

## 2022-08-15 DIAGNOSIS — O1493 Unspecified pre-eclampsia, third trimester: Secondary | ICD-10-CM | POA: Diagnosis not present

## 2022-08-15 LAB — COMPREHENSIVE METABOLIC PANEL
ALT: 27 U/L (ref 0–44)
AST: 26 U/L (ref 15–41)
Albumin: 2.7 g/dL — ABNORMAL LOW (ref 3.5–5.0)
Alkaline Phosphatase: 77 U/L (ref 38–126)
Anion gap: 9 (ref 5–15)
BUN: <5 mg/dL — ABNORMAL LOW (ref 6–20)
CO2: 22 mmol/L (ref 22–32)
Calcium: 8.9 mg/dL (ref 8.9–10.3)
Chloride: 104 mmol/L (ref 98–111)
Creatinine, Ser: 0.73 mg/dL (ref 0.44–1.00)
GFR, Estimated: >60 mL/min (ref >60)
Glucose, Bld: 93 mg/dL (ref 70–99)
Potassium: 3.4 mmol/L — ABNORMAL LOW (ref 3.5–5.1)
Sodium: 135 mmol/L (ref 135–145)
Total Bilirubin: 0.3 mg/dL (ref 0.3–1.2)
Total Protein: 6.1 g/dL — ABNORMAL LOW (ref 6.5–8.1)

## 2022-08-15 LAB — STREP GP B SUSCEPTIBILITY

## 2022-08-15 LAB — URINALYSIS, ROUTINE W REFLEX MICROSCOPIC
Bilirubin Urine: NEGATIVE
Glucose, UA: NEGATIVE mg/dL
Ketones, ur: 5 mg/dL — AB
Leukocytes,Ua: NEGATIVE
Nitrite: NEGATIVE
Protein, ur: 100 mg/dL — AB
Specific Gravity, Urine: 1.023 (ref 1.005–1.030)
pH: 5 (ref 5.0–8.0)

## 2022-08-15 LAB — PROTEIN / CREATININE RATIO, URINE
Creatinine, Urine: 308 mg/dL
Protein Creatinine Ratio: 0.18 mg/mg{Cre} — ABNORMAL HIGH (ref 0.00–0.15)
Total Protein, Urine: 54 mg/dL

## 2022-08-15 LAB — CBC
HCT: 35.5 % — ABNORMAL LOW (ref 36.0–46.0)
Hemoglobin: 12.1 g/dL (ref 12.0–15.0)
MCH: 26.4 pg (ref 26.0–34.0)
MCHC: 34.1 g/dL (ref 30.0–36.0)
MCV: 77.5 fL — ABNORMAL LOW (ref 80.0–100.0)
Platelets: 237 10*3/uL (ref 150–400)
RBC: 4.58 MIL/uL (ref 3.87–5.11)
RDW: 15.3 % (ref 11.5–15.5)
WBC: 10.5 10*3/uL (ref 4.0–10.5)
nRBC: 0 % (ref 0.0–0.2)

## 2022-08-15 LAB — STREP GP B CULTURE+RFLX: Strep Gp B Culture+Rflx: POSITIVE — AB
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
## 2022-08-15 NOTE — MAU Note (Signed)
.  [name] Curry is a 42 y.o. at [redacted]w[redacted]d here in MAU reporting: Pt reports ctx's that started at 141pm. Pt reports they were 6 minutes apart and now they are 13 minutes apart.   Onset of complaint: today 141 pm Pain score: 7/10 There were no vitals filed for this visit.   Lab orders placed from triage:   ua

## 2022-08-21 DIAGNOSIS — Z419 Encounter for procedure for purposes other than remedying health state, unspecified: Secondary | ICD-10-CM | POA: Diagnosis not present

## 2022-08-22 ENCOUNTER — Ambulatory Visit (INDEPENDENT_AMBULATORY_CARE_PROVIDER_SITE_OTHER): Payer: BLUE CROSS/BLUE SHIELD | Admitting: Advanced Practice Midwife

## 2022-08-22 VITALS — BP 120/87 | HR 105 | Wt 218.0 lb

## 2022-08-22 DIAGNOSIS — O4700 False labor before 37 completed weeks of gestation, unspecified trimester: Secondary | ICD-10-CM

## 2022-08-22 DIAGNOSIS — Z3A33 33 weeks gestation of pregnancy: Secondary | ICD-10-CM

## 2022-08-22 DIAGNOSIS — O0990 Supervision of high risk pregnancy, unspecified, unspecified trimester: Secondary | ICD-10-CM

## 2022-08-22 DIAGNOSIS — O09523 Supervision of elderly multigravida, third trimester: Secondary | ICD-10-CM

## 2022-08-22 DIAGNOSIS — O133 Gestational [pregnancy-induced] hypertension without significant proteinuria, third trimester: Secondary | ICD-10-CM

## 2022-08-22 NOTE — Progress Notes (Signed)
   PRENATAL VISIT NOTE  Subjective:  [name] Curry is a 42 y.o. G1P0 at [redacted]w[redacted]d being seen today for ongoing prenatal care.  She is currently monitored for the following issues for this high-risk pregnancy and has History of thyroidectomy; Mass of breast; H/O Graves' disease; Family history of breast cancer in sister; BMI 36.0-36.9,adult; Pre-diabetes; Vitamin D deficiency; Mixed hyperlipidemia; Beta thalassemia trait; NSVT (nonsustained ventricular tachycardia); Muscle spasms of neck; AMA (advanced maternal age) multigravida 37+, third trimester; Supervision of high risk pregnancy, antepartum; RSV (respiratory syncytial virus infection); Bilateral carpal tunnel syndrome; Penicillin allergy; and Gestational hypertension without significant proteinuria on their problem list.  Patient reports occasional contractions.  Contractions: Irregular. Vag. Bleeding: None.  Movement: Present. Denies leaking of fluid.   The following portions of the patient's history were reviewed and updated as appropriate: allergies, current medications, past family history, past medical history, past social history, past surgical history and problem list.   Objective:   Vitals:   08/22/22 0840  BP: 120/87  Pulse: (!) 105  Weight: 218 lb (98.9 kg)    Fetal Status: Fetal Heart Rate (bpm): 144 Fundal Height: 33 cm Movement: Present  Presentation: Vertex  General:  Alert, oriented and cooperative. Patient is in no acute distress.  Skin: Skin is warm and dry. No rash noted.   Cardiovascular: Normal heart rate noted  Respiratory: Normal respiratory effort, no problems with respiration noted  Abdomen: Soft, gravid, appropriate for gestational age.  Pain/Pressure: Present     Pelvic: Cervical exam performed in the presence of a chaperone Dilation: Closed Effacement (%): 50 Station: -2  Extremities: Normal range of motion.  Edema: Trace  Mental Status: Normal mood and affect. Normal behavior. Normal judgment and thought  content.   Assessment and Plan:  Pregnancy: G1P0 at [redacted]w[redacted]d 1. AMA (advanced maternal age) multigravida 47+, third trimester   2. Supervision of high risk pregnancy, antepartum --Anticipatory guidance about next visits/weeks of pregnancy given.   3. Gestational hypertension without significant proteinuria in third trimester --No meds, BP wnl today, no s/sx of PEC, labs in MAU on 3/26 wnl --Weekly antenatal testing to start 08/29/22 --BP cuff at home, pt to enter into Babyscripts  4. [redacted] weeks gestation of pregnancy  5. Preterm contractions --Pt with episodes of contractions, 4-5 times per hour at night and painful, but then resolve.   --Cervix   unchanged from previous MAU visits, closed/50%/-2    Preterm labor symptoms and general obstetric precautions including but not limited to vaginal bleeding, contractions, leaking of fluid and fetal movement were reviewed in detail with the patient. Please refer to After Visit Summary for other counseling recommendations.   No follow-ups on file.  Future Appointments  Date Time Provider [place]  08/29/2022  1:15 PM [facility] NURSE [facility]  08/29/2022  1:30 PM [facility] US2 [facility]  09/08/2022  9:35 AM Megan Salon, MD [facility]  09/15/2022  9:55 AM Megan Salon, MD [facility]  09/22/2022 10:35 AM Megan Salon, MD [facility]  09/29/2022 10:15 AM Megan Salon, MD [facility]  01/03/2023  9:00 AM Philemon Kingdom, MD [facility] None    Fatima Blank, CNM

## 2022-08-23 ENCOUNTER — Encounter (HOSPITAL_BASED_OUTPATIENT_CLINIC_OR_DEPARTMENT_OTHER): Payer: BLUE CROSS/BLUE SHIELD | Admitting: Obstetrics & Gynecology

## 2022-08-24 ENCOUNTER — Encounter (HOSPITAL_BASED_OUTPATIENT_CLINIC_OR_DEPARTMENT_OTHER): Payer: Commercial Managed Care - HMO | Admitting: Obstetrics & Gynecology

## 2022-08-25 ENCOUNTER — Encounter (HOSPITAL_BASED_OUTPATIENT_CLINIC_OR_DEPARTMENT_OTHER): Payer: BLUE CROSS/BLUE SHIELD | Admitting: Obstetrics & Gynecology

## 2022-08-29 ENCOUNTER — Ambulatory Visit: Payer: BLUE CROSS/BLUE SHIELD | Admitting: *Deleted

## 2022-08-29 ENCOUNTER — Encounter (HOSPITAL_BASED_OUTPATIENT_CLINIC_OR_DEPARTMENT_OTHER): Payer: Self-pay | Admitting: Obstetrics & Gynecology

## 2022-08-29 ENCOUNTER — Encounter: Payer: Self-pay | Admitting: *Deleted

## 2022-08-29 ENCOUNTER — Other Ambulatory Visit: Payer: Self-pay | Admitting: Obstetrics

## 2022-08-29 ENCOUNTER — Ambulatory Visit: Payer: BLUE CROSS/BLUE SHIELD | Attending: Obstetrics

## 2022-08-29 VITALS — BP 142/93 | HR 98

## 2022-08-29 DIAGNOSIS — O0990 Supervision of high risk pregnancy, unspecified, unspecified trimester: Secondary | ICD-10-CM

## 2022-08-29 DIAGNOSIS — O09523 Supervision of elderly multigravida, third trimester: Secondary | ICD-10-CM

## 2022-08-29 DIAGNOSIS — O09513 Supervision of elderly primigravida, third trimester: Secondary | ICD-10-CM

## 2022-08-29 DIAGNOSIS — O133 Gestational [pregnancy-induced] hypertension without significant proteinuria, third trimester: Secondary | ICD-10-CM

## 2022-08-29 DIAGNOSIS — E039 Hypothyroidism, unspecified: Secondary | ICD-10-CM

## 2022-08-29 DIAGNOSIS — D563 Thalassemia minor: Secondary | ICD-10-CM

## 2022-08-29 DIAGNOSIS — O99280 Endocrine, nutritional and metabolic diseases complicating pregnancy, unspecified trimester: Secondary | ICD-10-CM | POA: Insufficient documentation

## 2022-08-29 DIAGNOSIS — Z3A33 33 weeks gestation of pregnancy: Secondary | ICD-10-CM

## 2022-08-29 DIAGNOSIS — O99283 Endocrine, nutritional and metabolic diseases complicating pregnancy, third trimester: Secondary | ICD-10-CM | POA: Diagnosis not present

## 2022-08-29 DIAGNOSIS — E890 Postprocedural hypothyroidism: Secondary | ICD-10-CM

## 2022-08-29 DIAGNOSIS — O285 Abnormal chromosomal and genetic finding on antenatal screening of mother: Secondary | ICD-10-CM

## 2022-08-29 NOTE — Procedures (Signed)
KELINDA DUFFANY 04/09/1981 [redacted]w[redacted]d  Fetus A Non-Stress Test Interpretation for 08/29/22  Indication: Advanced Maternal Age >40 years  Fetal Heart Rate A Mode: External Baseline Rate (A): 140 bpm Variability: Moderate Accelerations: 15 x 15 Decelerations: None Multiple birth?: No  Uterine Activity Mode: Toco Contraction Frequency (min): none Resting Tone Palpated: Relaxed  Interpretation (Fetal Testing) Nonstress Test Interpretation: Reactive Overall Impression: Reassuring for gestational age Comments: tracing reviewed byDr. Grace Bushy

## 2022-09-06 ENCOUNTER — Encounter (HOSPITAL_BASED_OUTPATIENT_CLINIC_OR_DEPARTMENT_OTHER): Payer: Commercial Managed Care - HMO | Admitting: Obstetrics & Gynecology

## 2022-09-06 ENCOUNTER — Other Ambulatory Visit (INDEPENDENT_AMBULATORY_CARE_PROVIDER_SITE_OTHER): Payer: BLUE CROSS/BLUE SHIELD

## 2022-09-06 ENCOUNTER — Ambulatory Visit (INDEPENDENT_AMBULATORY_CARE_PROVIDER_SITE_OTHER): Payer: BLUE CROSS/BLUE SHIELD | Admitting: General Practice

## 2022-09-06 VITALS — BP 127/85 | HR 103

## 2022-09-06 DIAGNOSIS — Z3A34 34 weeks gestation of pregnancy: Secondary | ICD-10-CM | POA: Diagnosis not present

## 2022-09-06 DIAGNOSIS — O133 Gestational [pregnancy-induced] hypertension without significant proteinuria, third trimester: Secondary | ICD-10-CM

## 2022-09-06 NOTE — Progress Notes (Signed)
Pt informed that the ultrasound is considered a limited OB ultrasound and is not intended to be a complete ultrasound exam.  Patient also informed that the ultrasound is not being completed with the intent of assessing for fetal or placental anomalies or any pelvic abnormalities.  Explained that the purpose of today's ultrasound is to assess for  BPP, presentation, and AFI.  Patient acknowledges the purpose of the exam and the limitations of the study.  Catharina Pica H RN BSN 09/06/22     

## 2022-09-08 ENCOUNTER — Ambulatory Visit (INDEPENDENT_AMBULATORY_CARE_PROVIDER_SITE_OTHER): Payer: BLUE CROSS/BLUE SHIELD | Admitting: Obstetrics & Gynecology

## 2022-09-08 VITALS — BP 130/90 | HR 96 | Wt 219.8 lb

## 2022-09-08 DIAGNOSIS — Z8639 Personal history of other endocrine, nutritional and metabolic disease: Secondary | ICD-10-CM

## 2022-09-08 DIAGNOSIS — O133 Gestational [pregnancy-induced] hypertension without significant proteinuria, third trimester: Secondary | ICD-10-CM

## 2022-09-08 DIAGNOSIS — G5603 Carpal tunnel syndrome, bilateral upper limbs: Secondary | ICD-10-CM

## 2022-09-08 DIAGNOSIS — R7303 Prediabetes: Secondary | ICD-10-CM

## 2022-09-08 DIAGNOSIS — Z880 Allergy status to penicillin: Secondary | ICD-10-CM

## 2022-09-08 DIAGNOSIS — D563 Thalassemia minor: Secondary | ICD-10-CM

## 2022-09-08 DIAGNOSIS — Z3A34 34 weeks gestation of pregnancy: Secondary | ICD-10-CM

## 2022-09-08 DIAGNOSIS — O163 Unspecified maternal hypertension, third trimester: Secondary | ICD-10-CM

## 2022-09-08 DIAGNOSIS — O0990 Supervision of high risk pregnancy, unspecified, unspecified trimester: Secondary | ICD-10-CM

## 2022-09-08 DIAGNOSIS — B951 Streptococcus, group B, as the cause of diseases classified elsewhere: Secondary | ICD-10-CM

## 2022-09-08 DIAGNOSIS — O09523 Supervision of elderly multigravida, third trimester: Secondary | ICD-10-CM

## 2022-09-08 LAB — COMPREHENSIVE METABOLIC PANEL
ALT: 26 IU/L (ref 0–32)
AST: 23 IU/L (ref 0–40)
Albumin/Globulin Ratio: 1.5 (ref 1.2–2.2)
Albumin: 3.8 g/dL — ABNORMAL LOW (ref 3.9–4.9)
Alkaline Phosphatase: 117 IU/L (ref 44–121)
BUN/Creatinine Ratio: 5 — ABNORMAL LOW (ref 9–23)
BUN: 4 mg/dL — ABNORMAL LOW (ref 6–20)
Bilirubin Total: 0.2 mg/dL (ref 0.0–1.2)
CO2: 20 mmol/L (ref 20–29)
Calcium: 9.7 mg/dL (ref 8.7–10.2)
Chloride: 103 mmol/L (ref 96–106)
Creatinine, Ser: 0.73 mg/dL (ref 0.57–1.00)
Globulin, Total: 2.5 g/dL (ref 1.5–4.5)
Glucose: 84 mg/dL (ref 70–99)
Potassium: 4.6 mmol/L (ref 3.5–5.2)
Sodium: 138 mmol/L (ref 134–144)
Total Protein: 6.3 g/dL (ref 6.0–8.5)
eGFR: 107 mL/min/{1.73_m2} (ref >59)

## 2022-09-08 LAB — CBC
Hematocrit: 38.1 % (ref 34.0–46.6)
Hemoglobin: 12.8 g/dL (ref 11.1–15.9)
MCH: 26 pg — ABNORMAL LOW (ref 26.6–33.0)
MCHC: 33.6 g/dL (ref 31.5–35.7)
MCV: 77 fL — ABNORMAL LOW (ref 79–97)
Platelets: 218 10*3/uL (ref 150–450)
RBC: 4.92 x10E6/uL (ref 3.77–5.28)
RDW: 14.4 % (ref 11.7–15.4)
WBC: 10.4 10*3/uL (ref 3.4–10.8)

## 2022-09-08 NOTE — Progress Notes (Unsigned)
   PRENATAL VISIT NOTE  Subjective:  [name] Curry is a 42 y.o. G1P0 at [redacted]w[redacted]d being seen today for ongoing prenatal care.  She is currently monitored for the following issues for this {Blank single:19197::"high-risk","low-risk"} pregnancy and has History of thyroidectomy; Mass of breast; H/O Graves' disease; Family history of breast cancer in sister; BMI 36.0-36.9,adult; Pre-diabetes; Vitamin D deficiency; Mixed hyperlipidemia; Beta thalassemia trait; NSVT (nonsustained ventricular tachycardia); Muscle spasms of neck; AMA (advanced maternal age) multigravida 35+, third trimester; Supervision of high risk pregnancy, antepartum; RSV (respiratory syncytial virus infection); Bilateral carpal tunnel syndrome; Penicillin allergy; and Gestational hypertension without significant proteinuria on their problem list.  Patient reports {sx:14538}.  Contractions: Irregular. Vag. Bleeding: None.  Movement: Present. Denies leaking of fluid.   The following portions of the patient's history were reviewed and updated as appropriate: allergies, current medications, past family history, past medical history, past social history, past surgical history and problem list.   Objective:   Vitals:   09/08/22 0945 09/08/22 1014  BP: (!) 138/100 (!) 130/90  Pulse: (!) 108   Weight: 219 lb 12.8 oz (99.7 kg)     Fetal Status: Fetal Heart Rate (bpm): 142   Movement: Present     General:  Alert, oriented and cooperative. Patient is in no acute distress.  Skin: Skin is warm and dry. No rash noted.   Cardiovascular: Normal heart rate noted  Respiratory: Normal respiratory effort, no problems with respiration noted  Abdomen: Soft, gravid, appropriate for gestational age.  Pain/Pressure: Present     Pelvic: Cervical exam deferred        Extremities: Normal range of motion.  Edema: Trace  Mental Status: Normal mood and affect. Normal behavior. Normal judgment and thought content.   Assessment and Plan:  Pregnancy: G1P0 at  [redacted]w[redacted]d 1. Elevated blood pressure affecting pregnancy in third trimester, antepartum *** - Comprehensive metabolic panel - CBC - Protein / creatinine ratio, urine  {Blank single:19197::"Term","Preterm"} labor symptoms and general obstetric precautions including but not limited to vaginal bleeding, contractions, leaking of fluid and fetal movement were reviewed in detail with the patient. Please refer to After Visit Summary for other counseling recommendations.   No follow-ups on file.  Future Appointments  Date Time Provider Department Center  09/13/2022  1:15 PM [facility]  09/15/2022  9:55 AM Jerene Bears, MD [facility]  09/20/2022  1:15 PM [facility]  09/22/2022 10:35 AM Jerene Bears, MD [facility]  09/26/2022  9:15 AM Jerene Bears, MD [facility]  09/27/2022  6:30 AM [facility] SCHED ROOM [facility] None  09/27/2022  1:15 PM [facility] NST [facility]  01/03/2023  9:00 AM Carlus Pavlov, MD [facility] None    Jerene Bears, MD

## 2022-09-09 ENCOUNTER — Encounter (HOSPITAL_BASED_OUTPATIENT_CLINIC_OR_DEPARTMENT_OTHER): Payer: Self-pay | Admitting: Obstetrics & Gynecology

## 2022-09-09 LAB — PROTEIN / CREATININE RATIO, URINE
Creatinine, Urine: 396.7 mg/dL
Protein, Ur: 72.7 mg/dL
Protein/Creat Ratio: 183 mg/g creat (ref 0–200)

## 2022-09-13 ENCOUNTER — Ambulatory Visit (INDEPENDENT_AMBULATORY_CARE_PROVIDER_SITE_OTHER): Payer: BLUE CROSS/BLUE SHIELD

## 2022-09-13 ENCOUNTER — Telehealth (HOSPITAL_COMMUNITY): Payer: Self-pay | Admitting: *Deleted

## 2022-09-13 ENCOUNTER — Encounter (HOSPITAL_COMMUNITY): Payer: Self-pay | Admitting: *Deleted

## 2022-09-13 ENCOUNTER — Ambulatory Visit: Payer: BLUE CROSS/BLUE SHIELD | Admitting: *Deleted

## 2022-09-13 ENCOUNTER — Other Ambulatory Visit (INDEPENDENT_AMBULATORY_CARE_PROVIDER_SITE_OTHER): Payer: BLUE CROSS/BLUE SHIELD

## 2022-09-13 VITALS — BP 139/81 | HR 105 | Wt 222.5 lb

## 2022-09-13 DIAGNOSIS — E890 Postprocedural hypothyroidism: Secondary | ICD-10-CM

## 2022-09-13 DIAGNOSIS — Z3A35 35 weeks gestation of pregnancy: Secondary | ICD-10-CM | POA: Diagnosis not present

## 2022-09-13 DIAGNOSIS — O133 Gestational [pregnancy-induced] hypertension without significant proteinuria, third trimester: Secondary | ICD-10-CM

## 2022-09-13 NOTE — Telephone Encounter (Signed)
Preadmission screen  

## 2022-09-13 NOTE — Progress Notes (Signed)

## 2022-09-14 LAB — TSH: TSH: 1.38 mIU/L

## 2022-09-14 LAB — T4: T4, Total: 11.1 ug/dL (ref 5.1–11.9)

## 2022-09-15 ENCOUNTER — Ambulatory Visit (HOSPITAL_BASED_OUTPATIENT_CLINIC_OR_DEPARTMENT_OTHER): Payer: BLUE CROSS/BLUE SHIELD | Admitting: Obstetrics & Gynecology

## 2022-09-15 VITALS — BP 131/84 | HR 91 | Wt 224.0 lb

## 2022-09-15 DIAGNOSIS — D563 Thalassemia minor: Secondary | ICD-10-CM

## 2022-09-15 DIAGNOSIS — O133 Gestational [pregnancy-induced] hypertension without significant proteinuria, third trimester: Secondary | ICD-10-CM

## 2022-09-15 DIAGNOSIS — Z3A35 35 weeks gestation of pregnancy: Secondary | ICD-10-CM

## 2022-09-15 DIAGNOSIS — B951 Streptococcus, group B, as the cause of diseases classified elsewhere: Secondary | ICD-10-CM

## 2022-09-15 DIAGNOSIS — O09523 Supervision of elderly multigravida, third trimester: Secondary | ICD-10-CM

## 2022-09-15 DIAGNOSIS — E890 Postprocedural hypothyroidism: Secondary | ICD-10-CM

## 2022-09-15 DIAGNOSIS — G5603 Carpal tunnel syndrome, bilateral upper limbs: Secondary | ICD-10-CM

## 2022-09-15 DIAGNOSIS — O0990 Supervision of high risk pregnancy, unspecified, unspecified trimester: Secondary | ICD-10-CM

## 2022-09-15 DIAGNOSIS — Z880 Allergy status to penicillin: Secondary | ICD-10-CM

## 2022-09-15 DIAGNOSIS — Z6836 Body mass index (BMI) 36.0-36.9, adult: Secondary | ICD-10-CM

## 2022-09-15 NOTE — Progress Notes (Signed)
   PRENATAL VISIT NOTE  Subjective:  [name] Curry is a 42 y.o. G1P0 at [redacted]w[redacted]d being seen today for ongoing prenatal care.  She is currently monitored for the following issues for this {Blank single:19197::"high-risk","low-risk"} pregnancy and has History of thyroidectomy; Mass of breast; H/O Graves' disease; Family history of breast cancer in sister; BMI 36.0-36.9,adult; Pre-diabetes; Vitamin D deficiency; Mixed hyperlipidemia; Beta thalassemia trait; NSVT (nonsustained ventricular tachycardia) (HCC); Muscle spasms of neck; AMA (advanced maternal age) multigravida 35+, third trimester; Supervision of high risk pregnancy, antepartum; RSV (respiratory syncytial virus infection); Bilateral carpal tunnel syndrome; Penicillin allergy; and Gestational hypertension without significant proteinuria on their problem list.  Patient reports {sx:14538}.  Contractions: Irregular. Vag. Bleeding: None.  Movement: Present. Denies leaking of fluid.   The following portions of the patient's history were reviewed and updated as appropriate: allergies, current medications, past family history, past medical history, past social history, past surgical history and problem list.   Objective:   Vitals:   09/15/22 1005  BP: 131/84  Pulse: 91  Weight: 224 lb (101.6 kg)    Fetal Status: Fetal Heart Rate (bpm): 135   Movement: Present     General:  Alert, oriented and cooperative. Patient is in no acute distress.  Skin: Skin is warm and dry. No rash noted.   Cardiovascular: Normal heart rate noted  Respiratory: Normal respiratory effort, no problems with respiration noted  Abdomen: Soft, gravid, appropriate for gestational age.  Pain/Pressure: Absent     Pelvic: {Blank single:19197::"Cervical exam performed in the presence of a chaperone","Cervical exam deferred"}        Extremities: Normal range of motion.  Edema: Trace  Mental Status: Normal mood and affect. Normal behavior. Normal judgment and thought content.    Assessment and Plan:  Pregnancy: G1P0 at [redacted]w[redacted]d There are no diagnoses linked to this encounter. {Blank single:19197::"Term","Preterm"} labor symptoms and general obstetric precautions including but not limited to vaginal bleeding, contractions, leaking of fluid and fetal movement were reviewed in detail with the patient. Please refer to After Visit Summary for other counseling recommendations.   No follow-ups on file.  Future Appointments  Date Time Provider Department Center  09/20/2022  1:15 PM [facility]ST [facility]  09/22/2022 10:35 AM Reva Bores, MD [facility]  09/26/2022  9:15 AM Jerene Bears, MD [facility]  09/27/2022  6:30 AM [facility] ROOM [facility] None  01/03/2023  9:00 AM Carlus Pavlov, MD [facility] None    Jerene Bears, MD

## 2022-09-17 DIAGNOSIS — B951 Streptococcus, group B, as the cause of diseases classified elsewhere: Secondary | ICD-10-CM | POA: Insufficient documentation

## 2022-09-20 ENCOUNTER — Other Ambulatory Visit: Payer: Self-pay

## 2022-09-20 ENCOUNTER — Ambulatory Visit (INDEPENDENT_AMBULATORY_CARE_PROVIDER_SITE_OTHER): Payer: BLUE CROSS/BLUE SHIELD | Admitting: General Practice

## 2022-09-20 ENCOUNTER — Other Ambulatory Visit (INDEPENDENT_AMBULATORY_CARE_PROVIDER_SITE_OTHER): Payer: BLUE CROSS/BLUE SHIELD

## 2022-09-20 VITALS — BP 137/80 | HR 90

## 2022-09-20 DIAGNOSIS — O133 Gestational [pregnancy-induced] hypertension without significant proteinuria, third trimester: Secondary | ICD-10-CM

## 2022-09-20 DIAGNOSIS — Z3A36 36 weeks gestation of pregnancy: Secondary | ICD-10-CM

## 2022-09-20 DIAGNOSIS — Z419 Encounter for procedure for purposes other than remedying health state, unspecified: Secondary | ICD-10-CM | POA: Diagnosis not present

## 2022-09-20 NOTE — Progress Notes (Signed)
Pt informed that the ultrasound is considered a limited OB ultrasound and is not intended to be a complete ultrasound exam.  Patient also informed that the ultrasound is not being completed with the intent of assessing for fetal or placental anomalies or any pelvic abnormalities.  Explained that the purpose of today's ultrasound is to assess for  BPP, presentation, and AFI.  Patient acknowledges the purpose of the exam and the limitations of the study.     Budd Freiermuth H RN BSN 09/20/22  

## 2022-09-21 ENCOUNTER — Inpatient Hospital Stay (HOSPITAL_COMMUNITY)
Admission: AD | Admit: 2022-09-21 | Discharge: 2022-09-21 | Disposition: A | Discharge status: Home / Self Care | Payer: BLUE CROSS/BLUE SHIELD | Attending: Obstetrics & Gynecology | Admitting: Obstetrics & Gynecology

## 2022-09-21 ENCOUNTER — Encounter (HOSPITAL_BASED_OUTPATIENT_CLINIC_OR_DEPARTMENT_OTHER): Payer: Commercial Managed Care - HMO | Admitting: Obstetrics & Gynecology

## 2022-09-21 ENCOUNTER — Encounter (HOSPITAL_COMMUNITY): Payer: Self-pay | Admitting: Obstetrics & Gynecology

## 2022-09-21 ENCOUNTER — Telehealth (HOSPITAL_BASED_OUTPATIENT_CLINIC_OR_DEPARTMENT_OTHER): Payer: Self-pay | Admitting: *Deleted

## 2022-09-21 DIAGNOSIS — Z3A36 36 weeks gestation of pregnancy: Secondary | ICD-10-CM | POA: Insufficient documentation

## 2022-09-21 DIAGNOSIS — Z880 Allergy status to penicillin: Secondary | ICD-10-CM | POA: Diagnosis not present

## 2022-09-21 DIAGNOSIS — R030 Elevated blood-pressure reading, without diagnosis of hypertension: Secondary | ICD-10-CM | POA: Diagnosis present

## 2022-09-21 DIAGNOSIS — Z7982 Long term (current) use of aspirin: Secondary | ICD-10-CM | POA: Diagnosis not present

## 2022-09-21 DIAGNOSIS — Z87891 Personal history of nicotine dependence: Secondary | ICD-10-CM | POA: Insufficient documentation

## 2022-09-21 DIAGNOSIS — O133 Gestational [pregnancy-induced] hypertension without significant proteinuria, third trimester: Secondary | ICD-10-CM | POA: Insufficient documentation
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
## 2022-09-21 NOTE — MAU Note (Signed)
...  [name] Curry is a 42 y.o. at [redacted]w[redacted]d here in MAU reporting: Elevated blood pressures at home today x2. She reports the first was in the 150's and the repeat was 149/87. She reports she was instructed to come to MAU for evaluation. She reports both of her feet are swelling. Unsure if her hands are swelling. She reports she has carpal tunnel and "it is hard to tell." Denies visual changes, HA, and RUQ/epigastric pain. Denies VB or LOF. +FM.  Onset of complaint: Today Pain score: Denies pain.  FHT: initial external Lab orders placed from triage: none

## 2022-09-21 NOTE — Discharge Instructions (Signed)

## 2022-09-21 NOTE — Telephone Encounter (Signed)
Called pt in response to elevated BP of 149/87 and symptoms of facial swelling reported in babyscripts. Pt states that she is on her way to [facility] for evaluation.

## 2022-09-22 ENCOUNTER — Other Ambulatory Visit (HOSPITAL_COMMUNITY)
Admission: RE | Admit: 2022-09-22 | Discharge: 2022-09-22 | Disposition: A | Discharge status: Home / Self Care | Payer: BLUE CROSS/BLUE SHIELD | Source: Ambulatory Visit | Attending: Obstetrics & Gynecology | Admitting: Obstetrics & Gynecology

## 2022-09-22 ENCOUNTER — Other Ambulatory Visit: Payer: Self-pay | Admitting: Advanced Practice Midwife

## 2022-09-22 ENCOUNTER — Ambulatory Visit (INDEPENDENT_AMBULATORY_CARE_PROVIDER_SITE_OTHER): Payer: BLUE CROSS/BLUE SHIELD | Admitting: Family Medicine

## 2022-09-22 VITALS — BP 141/90 | HR 92 | Wt 225.6 lb

## 2022-09-22 DIAGNOSIS — O0993 Supervision of high risk pregnancy, unspecified, third trimester: Secondary | ICD-10-CM

## 2022-09-22 DIAGNOSIS — O09523 Supervision of elderly multigravida, third trimester: Secondary | ICD-10-CM

## 2022-09-22 DIAGNOSIS — B951 Streptococcus, group B, as the cause of diseases classified elsewhere: Secondary | ICD-10-CM

## 2022-09-22 DIAGNOSIS — Z3A36 36 weeks gestation of pregnancy: Secondary | ICD-10-CM

## 2022-09-22 DIAGNOSIS — O133 Gestational [pregnancy-induced] hypertension without significant proteinuria, third trimester: Secondary | ICD-10-CM | POA: Diagnosis not present

## 2022-09-22 DIAGNOSIS — O0990 Supervision of high risk pregnancy, unspecified, unspecified trimester: Secondary | ICD-10-CM

## 2022-09-22 NOTE — Progress Notes (Signed)
   PRENATAL VISIT NOTE  Subjective:  [name] Curry is a 42 y.o. G1P0000 at [redacted]w[redacted]d being seen today for ongoing prenatal care.  She is currently monitored for the following issues for this high-risk pregnancy and has History of thyroidectomy; H/O Graves' disease; Family history of breast cancer in sister; BMI 36.0-36.9,adult; Pre-diabetes; Vitamin D deficiency; Mixed hyperlipidemia; Beta thalassemia trait; NSVT (nonsustained ventricular tachycardia) (HCC); Muscle spasms of neck; AMA (advanced maternal age) multigravida 35+, third trimester; Supervision of high risk pregnancy, antepartum; Bilateral carpal tunnel syndrome; Penicillin allergy; Gestational hypertension without significant proteinuria; and Group beta Strep positive on their problem list.  Patient reports  LE swelling .  Contractions: Irregular. Vag. Bleeding: None.  Movement: Present. Denies leaking of fluid.   The following portions of the patient's history were reviewed and updated as appropriate: allergies, current medications, past family history, past medical history, past social history, past surgical history and problem list.   Objective:   Vitals:   09/22/22 1112  BP: (!) 141/90  Pulse: 92  Weight: 225 lb 9.6 oz (102.3 kg)    Fetal Status: Fetal Heart Rate (bpm): 136 Fundal Height: 36 cm Movement: Present  Presentation: Vertex  General:  Alert, oriented and cooperative. Patient is in no acute distress.  Skin: Skin is warm and dry. No rash noted.   Cardiovascular: Normal heart rate noted  Respiratory: Normal respiratory effort, no problems with respiration noted  Abdomen: Soft, gravid, appropriate for gestational age.  Pain/Pressure: Present     Pelvic: Cervical exam deferred        Extremities: Normal range of motion.  Edema: Trace  Mental Status: Normal mood and affect. Normal behavior. Normal judgment and thought content.   Assessment and Plan:  Pregnancy: G1P0000 at [redacted]w[redacted]d 1. Supervision of high risk pregnancy in  third trimester GBS is already +ve - Cervicovaginal ancillary only( [place])  3. AMA (advanced maternal age) multigravida 35+, third trimester LR NIPT  4. Gestational hypertension without significant proteinuria in third trimester Labs today Antenatal testing with MFM IOL scheduled for 37 + weeks. - Protein / creatinine ratio, urine - CBC - Comprehensive metabolic panel  5. Group beta Strep positive Will need treatment in labor - resistant to clinda and erythro  Preterm labor symptoms and general obstetric precautions including but not limited to vaginal bleeding, contractions, leaking of fluid and fetal movement were reviewed in detail with the patient. Please refer to After Visit Summary for other counseling recommendations.   Return in 4 weeks (on 10/20/2022).  Future Appointments  Date Time Provider Department Center  09/26/2022  9:15 AM Jerene Bears, MD [facility]  09/27/2022  6:30 AM [facility] ROOM [facility] None  01/03/2023  9:00 AM Carlus Pavlov, MD [facility] None    Reva Bores, MD

## 2022-09-23 LAB — COMPREHENSIVE METABOLIC PANEL
ALT: 24 IU/L (ref 0–32)
AST: 31 IU/L (ref 0–40)
Albumin/Globulin Ratio: 1.4 (ref 1.2–2.2)
Albumin: 3.5 g/dL — ABNORMAL LOW (ref 3.9–4.9)
Alkaline Phosphatase: 131 IU/L — ABNORMAL HIGH (ref 44–121)
BUN/Creatinine Ratio: 7 — ABNORMAL LOW (ref 9–23)
BUN: 5 mg/dL — ABNORMAL LOW (ref 6–20)
Bilirubin Total: 0.3 mg/dL (ref 0.0–1.2)
CO2: 19 mmol/L — ABNORMAL LOW (ref 20–29)
Calcium: 9.1 mg/dL (ref 8.7–10.2)
Chloride: 103 mmol/L (ref 96–106)
Creatinine, Ser: 0.68 mg/dL (ref 0.57–1.00)
Globulin, Total: 2.5 g/dL (ref 1.5–4.5)
Glucose: 72 mg/dL (ref 70–99)
Potassium: 4.1 mmol/L (ref 3.5–5.2)
Sodium: 138 mmol/L (ref 134–144)
Total Protein: 6 g/dL (ref 6.0–8.5)
eGFR: 114 mL/min/{1.73_m2} (ref >59)

## 2022-09-23 LAB — PROTEIN / CREATININE RATIO, URINE
Creatinine, Urine: 278.6 mg/dL
Protein, Ur: 44.6 mg/dL
Protein/Creat Ratio: 160 mg/g creat (ref 0–200)

## 2022-09-23 LAB — CBC
Hematocrit: 38.5 % (ref 34.0–46.6)
Hemoglobin: 12.7 g/dL (ref 11.1–15.9)
MCH: 26.3 pg — ABNORMAL LOW (ref 26.6–33.0)
MCHC: 33 g/dL (ref 31.5–35.7)
MCV: 80 fL (ref 79–97)
Platelets: 197 10*3/uL (ref 150–450)
RBC: 4.83 x10E6/uL (ref 3.77–5.28)
RDW: 16 % — ABNORMAL HIGH (ref 11.7–15.4)
WBC: 10.3 10*3/uL (ref 3.4–10.8)

## 2022-09-25 LAB — CERVICOVAGINAL ANCILLARY ONLY
Chlamydia: NEGATIVE
Comment: NEGATIVE
Comment: NORMAL
Neisseria Gonorrhea: NEGATIVE

## 2022-09-26 ENCOUNTER — Other Ambulatory Visit: Payer: Self-pay

## 2022-09-26 ENCOUNTER — Encounter (HOSPITAL_BASED_OUTPATIENT_CLINIC_OR_DEPARTMENT_OTHER): Payer: BLUE CROSS/BLUE SHIELD | Admitting: Obstetrics & Gynecology

## 2022-09-26 ENCOUNTER — Inpatient Hospital Stay (HOSPITAL_COMMUNITY)
Admission: AD | Admit: 2022-09-26 | Discharge: 2022-09-28 | DRG: 768 | Disposition: A | Discharge status: Home / Self Care | Payer: BLUE CROSS/BLUE SHIELD | Attending: Obstetrics and Gynecology | Admitting: Obstetrics and Gynecology

## 2022-09-26 ENCOUNTER — Inpatient Hospital Stay (HOSPITAL_COMMUNITY): Payer: BLUE CROSS/BLUE SHIELD | Admitting: Anesthesiology

## 2022-09-26 ENCOUNTER — Encounter (HOSPITAL_COMMUNITY): Payer: Self-pay | Admitting: Obstetrics & Gynecology

## 2022-09-26 DIAGNOSIS — O0990 Supervision of high risk pregnancy, unspecified, unspecified trimester: Secondary | ICD-10-CM

## 2022-09-26 DIAGNOSIS — S3763XA Laceration of uterus, initial encounter: Secondary | ICD-10-CM | POA: Insufficient documentation

## 2022-09-26 DIAGNOSIS — E890 Postprocedural hypothyroidism: Secondary | ICD-10-CM | POA: Diagnosis present

## 2022-09-26 DIAGNOSIS — D563 Thalassemia minor: Secondary | ICD-10-CM | POA: Diagnosis present

## 2022-09-26 DIAGNOSIS — Z87891 Personal history of nicotine dependence: Secondary | ICD-10-CM | POA: Diagnosis not present

## 2022-09-26 DIAGNOSIS — Z3A37 37 weeks gestation of pregnancy: Secondary | ICD-10-CM

## 2022-09-26 DIAGNOSIS — Z148 Genetic carrier of other disease: Secondary | ICD-10-CM | POA: Diagnosis not present

## 2022-09-26 DIAGNOSIS — O26893 Other specified pregnancy related conditions, third trimester: Secondary | ICD-10-CM | POA: Diagnosis present

## 2022-09-26 DIAGNOSIS — Z880 Allergy status to penicillin: Secondary | ICD-10-CM

## 2022-09-26 DIAGNOSIS — O139 Gestational [pregnancy-induced] hypertension without significant proteinuria, unspecified trimester: Secondary | ICD-10-CM | POA: Diagnosis present

## 2022-09-26 DIAGNOSIS — O99824 Streptococcus B carrier state complicating childbirth: Secondary | ICD-10-CM | POA: Diagnosis present

## 2022-09-26 DIAGNOSIS — O99820 Streptococcus B carrier state complicating pregnancy: Secondary | ICD-10-CM | POA: Diagnosis not present

## 2022-09-26 DIAGNOSIS — O99284 Endocrine, nutritional and metabolic diseases complicating childbirth: Secondary | ICD-10-CM | POA: Diagnosis present

## 2022-09-26 DIAGNOSIS — O09523 Supervision of elderly multigravida, third trimester: Secondary | ICD-10-CM | POA: Diagnosis present

## 2022-09-26 DIAGNOSIS — O134 Gestational [pregnancy-induced] hypertension without significant proteinuria, complicating childbirth: Principal | ICD-10-CM | POA: Diagnosis present

## 2022-09-26 DIAGNOSIS — Z79890 Hormone replacement therapy (postmenopausal): Secondary | ICD-10-CM

## 2022-09-26 DIAGNOSIS — B951 Streptococcus, group B, as the cause of diseases classified elsewhere: Secondary | ICD-10-CM | POA: Diagnosis present

## 2022-09-26 DIAGNOSIS — O4202 Full-term premature rupture of membranes, onset of labor within 24 hours of rupture: Secondary | ICD-10-CM | POA: Diagnosis not present

## 2022-09-26 DIAGNOSIS — Z8759 Personal history of other complications of pregnancy, childbirth and the puerperium: Secondary | ICD-10-CM | POA: Diagnosis present

## 2022-09-26 DIAGNOSIS — Z7982 Long term (current) use of aspirin: Secondary | ICD-10-CM | POA: Diagnosis not present

## 2022-09-26 LAB — CBC
HCT: 37.6 % (ref 36.0–46.0)
HCT: 35 % — ABNORMAL LOW (ref 36.0–46.0)
HCT: 30.8 % — ABNORMAL LOW (ref 36.0–46.0)
Hemoglobin: 12.7 g/dL (ref 12.0–15.0)
Hemoglobin: 11.7 g/dL — ABNORMAL LOW (ref 12.0–15.0)
Hemoglobin: 10.5 g/dL — ABNORMAL LOW (ref 12.0–15.0)
MCH: 26.8 pg (ref 26.0–34.0)
MCH: 26.5 pg (ref 26.0–34.0)
MCH: 26.2 pg (ref 26.0–34.0)
MCHC: 33.8 g/dL (ref 30.0–36.0)
MCHC: 33.4 g/dL (ref 30.0–36.0)
MCHC: 34.1 g/dL (ref 30.0–36.0)
MCV: 79.3 fL — ABNORMAL LOW (ref 80.0–100.0)
MCV: 79.2 fL — ABNORMAL LOW (ref 80.0–100.0)
MCV: 76.8 fL — ABNORMAL LOW (ref 80.0–100.0)
Platelets: 200 10*3/uL (ref 150–400)
Platelets: 206 10*3/uL (ref 150–400)
Platelets: 206 10*3/uL (ref 150–400)
RBC: 4.74 MIL/uL (ref 3.87–5.11)
RBC: 4.42 MIL/uL (ref 3.87–5.11)
RBC: 4.01 MIL/uL (ref 3.87–5.11)
RDW: 15.9 % — ABNORMAL HIGH (ref 11.5–15.5)
RDW: 15.9 % — ABNORMAL HIGH (ref 11.5–15.5)
RDW: 15.6 % — ABNORMAL HIGH (ref 11.5–15.5)
WBC: 10.5 10*3/uL (ref 4.0–10.5)
WBC: 12.9 10*3/uL — ABNORMAL HIGH (ref 4.0–10.5)
WBC: 16.4 10*3/uL — ABNORMAL HIGH (ref 4.0–10.5)
nRBC: 0 % (ref 0.0–0.2)
nRBC: 0.2 % (ref 0.0–0.2)
nRBC: 0 % (ref 0.0–0.2)

## 2022-09-26 LAB — COMPREHENSIVE METABOLIC PANEL
ALT: 30 U/L (ref 0–44)
AST: 35 U/L (ref 15–41)
Albumin: 2.8 g/dL — ABNORMAL LOW (ref 3.5–5.0)
Alkaline Phosphatase: 112 U/L (ref 38–126)
Anion gap: 14 (ref 5–15)
BUN: 7 mg/dL (ref 6–20)
CO2: 17 mmol/L — ABNORMAL LOW (ref 22–32)
Calcium: 8.9 mg/dL (ref 8.9–10.3)
Chloride: 100 mmol/L (ref 98–111)
Creatinine, Ser: 0.62 mg/dL (ref 0.44–1.00)
GFR, Estimated: >60 mL/min (ref >60)
Glucose, Bld: 93 mg/dL (ref 70–99)
Potassium: 3.2 mmol/L — ABNORMAL LOW (ref 3.5–5.1)
Sodium: 131 mmol/L — ABNORMAL LOW (ref 135–145)
Total Bilirubin: 0.3 mg/dL (ref 0.3–1.2)
Total Protein: 6.5 g/dL (ref 6.5–8.1)

## 2022-09-26 LAB — DIC (DISSEMINATED INTRAVASCULAR COAGULATION)PANEL
D-Dimer, Quant: 1.39 ug/mL-FEU — ABNORMAL HIGH (ref 0.00–0.50)
Fibrinogen: 623 mg/dL — ABNORMAL HIGH (ref 210–475)
INR: 1.1 (ref 0.8–1.2)
Platelets: 208 10*3/uL (ref 150–400)
Prothrombin Time: 14 seconds (ref 11.4–15.2)
Smear Review: NONE SEEN
aPTT: 29 seconds (ref 24–36)

## 2022-09-26 LAB — TYPE AND SCREEN
ABO/RH(D): A POS
Antibody Screen: NEGATIVE

## 2022-09-26 LAB — PROTEIN / CREATININE RATIO, URINE
Creatinine, Urine: 296 mg/dL
Protein Creatinine Ratio: 0.23 mg/mg{Cre} — ABNORMAL HIGH (ref 0.00–0.15)
Total Protein, Urine: 69 mg/dL

## 2022-09-26 LAB — RPR: RPR Ser Ql: NONREACTIVE

## 2022-09-26 LAB — POCT FERN TEST
POCT Fern Test: NEGATIVE
POCT Fern Test: POSITIVE

## 2022-09-26 MED ORDER — SENNOSIDES-DOCUSATE SODIUM 8.6-50 MG PO TABS
2.0000 | ORAL_TABLET | Freq: Every day | ORAL | Status: DC
  Administered 2022-09-27 – 2022-09-28 (×2): 2 via ORAL
  Filled 2022-09-26 (×2): qty 2

## 2022-09-26 MED ORDER — LABETALOL HCL 5 MG/ML IV SOLN: 20.0000 mg | INTRAVENOUS | Status: DC | PRN

## 2022-09-26 MED ORDER — SODIUM CHLORIDE 0.9 % IV SOLN: INTRAVENOUS | Status: DC

## 2022-09-26 MED ORDER — ACETAMINOPHEN 325 MG PO TABS: 650.0000 mg | ORAL_TABLET | ORAL | Status: DC | PRN

## 2022-09-26 MED ORDER — DIBUCAINE (PERIANAL) 1 % EX OINT: 1.0000 | TOPICAL_OINTMENT | CUTANEOUS | Status: DC | PRN

## 2022-09-26 MED ORDER — COCONUT OIL OIL: 1.0000 | TOPICAL_OIL | Status: DC | PRN

## 2022-09-26 MED ORDER — OXYCODONE-ACETAMINOPHEN 5-325 MG PO TABS: 2.0000 | ORAL_TABLET | ORAL | Status: DC | PRN

## 2022-09-26 MED ORDER — OXYCODONE HCL 5 MG PO TABS: 10.0000 mg | ORAL_TABLET | ORAL | Status: DC | PRN

## 2022-09-26 MED ORDER — TRANEXAMIC ACID-NACL 1000-0.7 MG/100ML-% IV SOLN
INTRAVENOUS | Status: AC
  Administered 2022-09-26: 1000 mg
  Filled 2022-09-26: qty 100

## 2022-09-26 MED ORDER — DIPHENHYDRAMINE HCL 50 MG/ML IJ SOLN: 12.5000 mg | INTRAMUSCULAR | Status: DC | PRN

## 2022-09-26 MED ORDER — WITCH HAZEL-GLYCERIN EX PADS: 1.0000 | MEDICATED_PAD | CUTANEOUS | Status: DC | PRN

## 2022-09-26 MED ORDER — BENZOCAINE-MENTHOL 20-0.5 % EX AERO: 1.0000 | INHALATION_SPRAY | CUTANEOUS | Status: DC | PRN

## 2022-09-26 MED ORDER — OXYTOCIN BOLUS FROM INFUSION
333.0000 mL | Freq: Once | INTRAVENOUS | Status: AC
  Administered 2022-09-26: 333 mL via INTRAVENOUS

## 2022-09-26 MED ORDER — OXYCODONE HCL 5 MG PO TABS
5.0000 mg | ORAL_TABLET | ORAL | Status: DC | PRN
  Administered 2022-09-26 – 2022-09-28 (×4): 5 mg via ORAL
  Filled 2022-09-26 (×5): qty 1

## 2022-09-26 MED ORDER — SIMETHICONE 80 MG PO CHEW: 80.0000 mg | CHEWABLE_TABLET | ORAL | Status: DC | PRN

## 2022-09-26 MED ORDER — EPHEDRINE 5 MG/ML INJ: 10.0000 mg | INTRAVENOUS | Status: DC | PRN

## 2022-09-26 MED ORDER — FENTANYL CITRATE (PF) 100 MCG/2ML IJ SOLN
50.0000 ug | INTRAMUSCULAR | Status: DC | PRN
  Administered 2022-09-26: 100 ug via INTRAVENOUS
  Filled 2022-09-26: qty 2

## 2022-09-26 MED ORDER — ONDANSETRON HCL 4 MG PO TABS: 4.0000 mg | ORAL_TABLET | ORAL | Status: DC | PRN

## 2022-09-26 MED ORDER — PHENYLEPHRINE 80 MCG/ML (10ML) SYRINGE FOR IV PUSH (FOR BLOOD PRESSURE SUPPORT): 80.0000 ug | PREFILLED_SYRINGE | INTRAVENOUS | Status: DC | PRN

## 2022-09-26 MED ORDER — LIDOCAINE HCL (PF) 1 % IJ SOLN: 30.0000 mL | INTRAMUSCULAR | Status: DC | PRN

## 2022-09-26 MED ORDER — CEFAZOLIN SODIUM-DEXTROSE 1-4 GM/50ML-% IV SOLN
1.0000 g | Freq: Three times a day (TID) | INTRAVENOUS | Status: DC
  Filled 2022-09-26: qty 50

## 2022-09-26 MED ORDER — FLEET ENEMA 7-19 GM/118ML RE ENEM: 1.0000 | ENEMA | RECTAL | Status: DC | PRN

## 2022-09-26 MED ORDER — PRENATAL MULTIVITAMIN CH
1.0000 | ORAL_TABLET | Freq: Every day | ORAL | Status: DC
  Filled 2022-09-26: qty 1

## 2022-09-26 MED ORDER — HYDRALAZINE HCL 20 MG/ML IJ SOLN: 10.0000 mg | INTRAMUSCULAR | Status: DC | PRN

## 2022-09-26 MED ORDER — TETANUS-DIPHTH-ACELL PERTUSSIS 5-2.5-18.5 LF-MCG/0.5 IM SUSY: 0.5000 mL | PREFILLED_SYRINGE | Freq: Once | INTRAMUSCULAR | Status: DC

## 2022-09-26 MED ORDER — FENTANYL-BUPIVACAINE-NACL 0.5-0.125-0.9 MG/250ML-% EP SOLN: 12.0000 mL/h | EPIDURAL | Status: DC | PRN

## 2022-09-26 MED ORDER — LACTATED RINGERS IV SOLN: 500.0000 mL | INTRAVENOUS | Status: DC | PRN

## 2022-09-26 MED ORDER — PHENYLEPHRINE 80 MCG/ML (10ML) SYRINGE FOR IV PUSH (FOR BLOOD PRESSURE SUPPORT)
80.0000 ug | PREFILLED_SYRINGE | INTRAVENOUS | Status: DC | PRN
  Filled 2022-09-26: qty 10

## 2022-09-26 MED ORDER — ONDANSETRON HCL 4 MG/2ML IJ SOLN: 4.0000 mg | Freq: Four times a day (QID) | INTRAMUSCULAR | Status: DC | PRN

## 2022-09-26 MED ORDER — OXYTOCIN-SODIUM CHLORIDE 30-0.9 UT/500ML-% IV SOLN
2.5000 [IU]/h | INTRAVENOUS | Status: DC
  Filled 2022-09-26: qty 500

## 2022-09-26 MED ORDER — OXYTOCIN-SODIUM CHLORIDE 30-0.9 UT/500ML-% IV SOLN: 2.5000 [IU]/h | INTRAVENOUS | Status: DC | PRN

## 2022-09-26 MED ORDER — ONDANSETRON HCL 4 MG/2ML IJ SOLN: 4.0000 mg | INTRAMUSCULAR | Status: DC | PRN

## 2022-09-26 MED ORDER — CEFAZOLIN SODIUM-DEXTROSE 2-4 GM/100ML-% IV SOLN
2.0000 g | Freq: Once | INTRAVENOUS | Status: AC
  Administered 2022-09-26: 2 g via INTRAVENOUS
  Filled 2022-09-26: qty 100

## 2022-09-26 MED ORDER — SOD CITRATE-CITRIC ACID 500-334 MG/5ML PO SOLN: 30.0000 mL | ORAL | Status: DC | PRN

## 2022-09-26 MED ORDER — OXYCODONE-ACETAMINOPHEN 5-325 MG PO TABS: 1.0000 | ORAL_TABLET | ORAL | Status: DC | PRN

## 2022-09-26 MED ORDER — HYDRALAZINE HCL 20 MG/ML IJ SOLN: 5.0000 mg | INTRAMUSCULAR | Status: DC | PRN

## 2022-09-26 MED ORDER — CEFAZOLIN SODIUM-DEXTROSE 2-4 GM/100ML-% IV SOLN: 2.0000 g | Freq: Once | INTRAVENOUS | Status: DC

## 2022-09-26 MED ORDER — VANCOMYCIN HCL 10 G IV SOLR
2000.0000 mg | Freq: Three times a day (TID) | INTRAVENOUS | Status: DC
  Filled 2022-09-26 (×2): qty 20

## 2022-09-26 MED ORDER — DIPHENHYDRAMINE HCL 25 MG PO CAPS: 25.0000 mg | ORAL_CAPSULE | Freq: Four times a day (QID) | ORAL | Status: DC | PRN

## 2022-09-26 MED ORDER — LIDOCAINE HCL (PF) 1 % IJ SOLN
INTRAMUSCULAR | Status: DC | PRN
  Administered 2022-09-26: 8 mL via EPIDURAL

## 2022-09-26 MED ORDER — IBUPROFEN 600 MG PO TABS
600.0000 mg | ORAL_TABLET | Freq: Four times a day (QID) | ORAL | Status: DC
  Administered 2022-09-26 – 2022-09-28 (×6): 600 mg via ORAL
  Filled 2022-09-26 (×7): qty 1

## 2022-09-26 MED ORDER — LABETALOL HCL 5 MG/ML IV SOLN: 40.0000 mg | INTRAVENOUS | Status: DC | PRN

## 2022-09-26 MED ORDER — FENTANYL-BUPIVACAINE-NACL 0.5-0.125-0.9 MG/250ML-% EP SOLN
12.0000 mL/h | EPIDURAL | Status: DC | PRN
  Administered 2022-09-26: 12 mL/h via EPIDURAL
  Filled 2022-09-26: qty 250

## 2022-09-26 MED ORDER — TRANEXAMIC ACID-NACL 1000-0.7 MG/100ML-% IV SOLN
1000.0000 mg | INTRAVENOUS | Status: AC
  Administered 2022-09-26: 1000 mg via INTRAVENOUS

## 2022-09-26 MED ORDER — LACTATED RINGERS IV SOLN: INTRAVENOUS | Status: DC

## 2022-09-26 MED ORDER — LACTATED RINGERS IV SOLN: 500.0000 mL | Freq: Once | INTRAVENOUS | Status: AC

## 2022-09-26 NOTE — Anesthesia Preprocedure Evaluation (Signed)
Anesthesia Evaluation  Patient identified by MRN, date of birth, ID band Patient awake    Reviewed: Allergy & Precautions, H&P , NPO status , Patient's Chart, lab work & pertinent test results, reviewed documented beta blocker date and time   Airway Mallampati: II  TM Distance: >3 FB Neck ROM: full    Dental no notable dental hx. (+) Teeth Intact, Dental Advisory Given   Pulmonary neg pulmonary ROS, former smoker   Pulmonary exam normal breath sounds clear to auscultation       Cardiovascular hypertension, Pt. on medications negative cardio ROS Normal cardiovascular exam Rhythm:regular Rate:Normal     Neuro/Psych  PSYCHIATRIC DISORDERS Anxiety Depression    negative neurological ROS     GI/Hepatic negative GI ROS, Neg liver ROS,,,  Endo/Other  Hypothyroidism    Renal/GU negative Renal ROS  negative genitourinary   Musculoskeletal   Abdominal   Peds  Hematology  (+) Blood dyscrasia, anemia   Anesthesia Other Findings   Reproductive/Obstetrics (+) Pregnancy                             Anesthesia Physical Anesthesia Plan  ASA: 3  Anesthesia Plan: Epidural   Post-op Pain Management: Minimal or no pain anticipated   Induction: Intravenous  PONV Risk Score and Plan: 2 and Treatment may vary due to age or medical condition  Airway Management Planned:   Additional Equipment: None  Intra-op Plan:   Post-operative Plan:   Informed Consent: I have reviewed the patients History and Physical, chart, labs and discussed the procedure including the risks, benefits and alternatives for the proposed anesthesia with the patient or authorized representative who has indicated his/her understanding and acceptance.     Dental Advisory Given  Plan Discussed with: Anesthesiologist  Anesthesia Plan Comments: (Labs checked- platelets confirmed with RN in room. Fetal heart tracing, per RN,  reported to be stable enough for sitting procedure. Discussed epidural, and patient consents to the procedure:  included risk of possible headache,backache, failed block, allergic reaction, and nerve injury. This patient was asked if she had any questions or concerns before the procedure started.)       Anesthesia Quick Evaluation
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
## 2022-09-26 NOTE — MAU Provider Note (Signed)
Event Date/Time   First Provider Initiated Contact with Patient 09/26/22 0617       S: Ms. [name] Curry is a 42 y.o. G1P0000 at [redacted]w[redacted]d  who presents to MAU today complaining of leaking of fluid since 0415. She endorses vaginal bleeding. She endorses contractions. She reports normal fetal movement.    O: BP (!) 140/99   Pulse 91   Temp 98.4 F (36.9 C) (Oral)   Resp 20   Ht 5\' 3"  (1.6 m)   Wt 100.7 kg   LMP 12/30/2021   SpO2 98%   BMI 39.33 kg/m  GENERAL: Well-developed, well-nourished female in no acute distress.  HEAD: Normocephalic, atraumatic.  CHEST: Normal effort of breathing, regular heart rate ABDOMEN: Soft, nontender, gravid PELVIC: Normal external female genitalia. Vagina is pink and rugated. Cervix appears closed and without active bleeding, but blood noted. No lesions. Red blood noted in vault.  Negative pooling. Fern Collected  Cervical exam:  Dilation: Closed Exam by:: Gerrit Heck, CNM   Fetal Monitoring: FHT: 135 bpm, Mod Var, -Decels, +Accels Toco: Irritability  Results for orders placed or performed during the hospital encounter of 09/26/22 (from the past 24 hour(s))  POCT fern test     Status: None   Collection Time: 09/26/22  6:11 AM  Result Value Ref Range   POCT Fern Test Negative = intact amniotic membranes      A: SIUP at [redacted]w[redacted]d  Membranes intact Vaginal Bleeding  P: NST reactive Fern Negative Dr. Katharine Look contacted and agrees with admit for vaginal bleeding. Patient updated on POC.  Nurse to place orders and coordinate transfer as appropriate.   Gerrit Heck, CNM 09/26/2022 6:24 AM

## 2022-09-26 NOTE — Anesthesia Procedure Notes (Signed)
Epidural Patient location during procedure: OB Start time: 09/26/2022 9:13 AM End time: 09/26/2022 9:18 AM  Staffing Anesthesiologist: Bethena Midget, MD  Preanesthetic Checklist Completed: patient identified, IV checked, site marked, risks and benefits discussed, surgical consent, monitors and equipment checked, pre-op evaluation and timeout performed  Epidural Patient position: sitting Prep: DuraPrep and site prepped and draped Patient monitoring: continuous pulse ox and blood pressure Approach: midline Location: L3-L4 Injection technique: LOR air  Needle:  Needle type: Tuohy  Needle gauge: 17 G Needle length: 9 cm and 9 Needle insertion depth: 9 cm Catheter type: closed end flexible Catheter size: 19 Gauge Catheter at skin depth: 15 cm Test dose: negative  Assessment Events: blood not aspirated, no cerebrospinal fluid, injection not painful, no injection resistance, no paresthesia and negative IV test

## 2022-09-26 NOTE — Discharge Summary (Signed)
Postpartum Discharge Summary  Date of Service updated: 09/28/22     Patient Name: [name] Curry DOB: Apr 20, 1981 MRN: [id number]  Date of admission: 09/26/2022 Delivery date:09/26/2022  Delivering provider: Myrtie Hawk  Date of discharge: 09/28/2022  Admitting diagnosis: Normal labor [O80, Z37.9] Intrauterine pregnancy: [redacted]w[redacted]d     Secondary diagnosis:  Principal Problem:   Vaginal delivery Active Problems:   Beta thalassemia trait   AMA (advanced maternal age) multigravida 35+, third trimester   Supervision of high risk pregnancy, antepartum   Gestational hypertension without significant proteinuria   Group beta Strep positive   Cervical laceration  Additional problems: NA    Discharge diagnosis: Term Pregnancy Delivered and Gestational Hypertension                                              Post partum procedures: NA Augmentation: N/A Complications: Hemorrhage>1050mL  Hospital course: Onset of Labor With Vaginal Delivery      42 y.o. yo G1P0000 at [redacted]w[redacted]d was admitted in Latent Labor on 09/26/2022. Labor course was complicated by none.  Membrane Rupture Time/Date: 7:50 AM ,09/26/2022   Delivery Method:Vaginal, Spontaneous  Episiotomy: None  Lacerations:  Cervical;1st degree;Periurethral  Patient had a postpartum course was unremarkable. She was placed on antihypertensive medications with good BP response. Lochia was normal.   She is ambulating, tolerating a regular diet, passing flatus, and urinating well. Felt amendable for discharge home. Discharge instructions, medications and follow reviewed with pt. Pt verbalized understanding. Patient is discharged home in stable condition on 09/28/22.  Newborn Data: Birth date:09/26/2022  Birth time:11:41 AM  Gender:Female  Living status:Living  Apgars:8 ,9  Weight:2760 g   Magnesium Sulfate received: No BMZ received: No Rhophylac:N/A MMR:N/A T-DaP:Given prenatally Flu: Yes Transfusion:No  Physical exam  Vitals:    09/27/22 1941 09/27/22 2348 09/28/22 0425 09/28/22 0742  BP: (!) 147/73 (!) 147/83 127/74 121/66  Pulse: 99 96 88 97  Resp: 16 17 16 17   Temp: 98.4 F (36.9 C) 98.3 F (36.8 C) 98.3 F (36.8 C) 98.3 F (36.8 C)  TempSrc: Oral Oral Oral Oral  SpO2: 99% 98% 99% 97%  Weight:      Height:       General: alert Lochia: appropriate Uterine Fundus: firm Incision: N/A DVT Evaluation: No evidence of DVT seen on physical exam. Labs: Lab Results  Component Value Date   WBC 14.2 (H) 09/27/2022   HGB 9.0 (L) 09/27/2022   HCT 26.5 (L) 09/27/2022   MCV 77.7 (L) 09/27/2022   PLT 179 09/27/2022      Latest Ref Rng & Units 09/26/2022    7:28 AM  CMP  Glucose 70 - 99 mg/dL 93   BUN 6 - 20 mg/dL 7   Creatinine 7.82 - 9.56 mg/dL 2.13   Sodium 086 - 578 mmol/L 131   Potassium 3.5 - 5.1 mmol/L 3.2   Chloride 98 - 111 mmol/L 100   CO2 22 - 32 mmol/L 17   Calcium 8.9 - 10.3 mg/dL 8.9   Total Protein 6.5 - 8.1 g/dL 6.5   Total Bilirubin 0.3 - 1.2 mg/dL 0.3   Alkaline Phos 38 - 126 U/L 112   AST 15 - 41 U/L 35   ALT 0 - 44 U/L 30    Edinburgh Score:    09/27/2022    7:40 PM  Edinburgh Postnatal Depression Scale  Screening Tool  I have been able to laugh and see the funny side of things. 0  I have looked forward with enjoyment to things. 0  I have blamed myself unnecessarily when things went wrong. 0  I have been anxious or worried for no good reason. 0  I have felt scared or panicky for no good reason. 0  Things have been getting on top of me. 1  I have been so unhappy that I have had difficulty sleeping. 0  I have felt sad or miserable. 0  I have been so unhappy that I have been crying. 0  The thought of harming myself has occurred to me. 0  Edinburgh Postnatal Depression Scale Total 1     After visit meds:  Allergies as of 09/28/2022       Reactions   Atenolol Other (See Comments)   Muscle cramps   Methimazole Hives   Penicillins Hives, Nausea And Vomiting   Pt tolerated  Cefazolin preop 11/2021 Has patient had a PCN reaction causing immediate rash, facial/tongue/throat swelling, SOB or lightheadedness with hypotension: #  #  #  YES  #  #  #  Has patient had a PCN reaction causing severe rash involving mucus membranes or skin necrosis: No Has patient had a PCN reaction that required hospitalization No Has patient had a PCN reaction occurring within the last 10 years: No If all of the above answers are "NO", then may proceed with Cephalosporin use.   Sulfa Antibiotics Hives, Nausea And Vomiting        Medication List     STOP taking these medications    aspirin EC 81 MG tablet       TAKE these medications    ferrous sulfate 325 (65 FE) MG tablet Take 1 tablet (325 mg total) by mouth every other day. Start taking on: Sep 29, 2022   furosemide 20 MG tablet Commonly known as: LASIX Take 1 tablet (20 mg total) by mouth daily.   ibuprofen 600 MG tablet Commonly known as: ADVIL Take 1 tablet (600 mg total) by mouth every 6 (six) hours.   levothyroxine 137 MCG tablet Commonly known as: SYNTHROID Take 1 tablet (137 mcg total) by mouth daily.   NIFEdipine 30 MG 24 hr tablet Commonly known as: ADALAT CC Take 1 tablet (30 mg total) by mouth 2 (two) times daily.   Prenatal Vitamin Plus Low Iron 27-1 MG Tabs Take 1 tablet by mouth daily.         Discharge home in stable condition Infant Feeding: Breast Infant Disposition:home with mother Discharge instruction: per After Visit Summary and Postpartum booklet. Activity: Advance as tolerated. Pelvic rest for 6 weeks.  Diet: routine diet Future Appointments: Future Appointments  Date Time Provider Department Center  10/31/2022  4:10 PM Jerene Bears, MD [facility]   Follow up Visit:  Follow-up Information     [facility] Follow up.   Specialty: Obstetrics and Gynecology Contact information: [address]  [place] [address]               Message sent to [facility] 5/7  Please schedule this patient for a In person postpartum visit in 6 weeks with the following provider: MD. Additional Postpartum F/U: cervical laceration check 1 week and BP check 1 week  High risk pregnancy complicated by: HTN Delivery mode:  Vaginal, Spontaneous  Anticipated Birth Control:  Nexplanon   09/28/2022 Casimiro Needle  Vilinda Flake, MD

## 2022-09-26 NOTE — Progress Notes (Signed)
Labor Progress Note  [name] Curry is a 42 y.o. G1P0000 at [redacted]w[redacted]d presented for SROM  S: no concerns, epidural working but still feeling some pressure  O:  BP 138/78   Pulse 85   Temp 98.1 F (36.7 C) (Oral)   Resp 18   Ht 5\' 3"  (1.6 m)   Wt 100.7 kg   LMP 12/30/2021   SpO2 98%   BMI 39.33 kg/m  EFM: 130bpm/Moderate variability/ 15x15 accels/ Early decels  CVE: Dilation: 10 Dilation Complete Date: 09/26/22 Dilation Complete Time: 1035 Effacement (%): 100 Cervical Position: Middle Station: 0 Presentation: Vertex Exam by:: Barb Merino, MD   A&P: 42 y.o. G1P0000 [redacted]w[redacted]d  here for SROM as above  #Labor: Progressing well. Anticipate SVD #Pain: Epidural #FWB: CAT 1 #GBS positive ancef #gHTN: BP elevated. CBC/CMP unremarkable, P/C ratio pending  Vonna Drafts, MD FM PGY1, Faculty practice [facility], Center for [facility] 09/26/22  10:56 AM

## 2022-09-26 NOTE — MAU Note (Signed)
.  [name] Curry is a 42 y.o. at [redacted]w[redacted]d here in MAU reporting: woke up at 0400 with ctx - every 5 minutes. States feeling wet at 0415 and noticed some VB, unsure if water is broken. +FM. Scheduled for induction tomorrow - GHTN. Not taking meds for BP. Denies HA, visual changes, or epigastric pain.    Onset of complaint: 0400 Pain score: 10 Vitals:   09/26/22 0526  BP: (!) 156/81  Pulse: 99  Resp: 20  Temp: 98.4 F (36.9 C)  SpO2: 98%

## 2022-09-27 ENCOUNTER — Inpatient Hospital Stay (HOSPITAL_COMMUNITY): Payer: BLUE CROSS/BLUE SHIELD

## 2022-09-27 ENCOUNTER — Other Ambulatory Visit: Payer: BLUE CROSS/BLUE SHIELD

## 2022-09-27 ENCOUNTER — Inpatient Hospital Stay (HOSPITAL_COMMUNITY)
Admission: RE | Admit: 2022-09-27 | Payer: BLUE CROSS/BLUE SHIELD | Source: Home / Self Care | Admitting: Family Medicine

## 2022-09-27 LAB — CBC
HCT: 26.5 % — ABNORMAL LOW (ref 36.0–46.0)
Hemoglobin: 9 g/dL — ABNORMAL LOW (ref 12.0–15.0)
MCH: 26.4 pg (ref 26.0–34.0)
MCHC: 34 g/dL (ref 30.0–36.0)
MCV: 77.7 fL — ABNORMAL LOW (ref 80.0–100.0)
Platelets: 179 10*3/uL (ref 150–400)
RBC: 3.41 MIL/uL — ABNORMAL LOW (ref 3.87–5.11)
RDW: 15.9 % — ABNORMAL HIGH (ref 11.5–15.5)
WBC: 14.2 10*3/uL — ABNORMAL HIGH (ref 4.0–10.5)
nRBC: 0 % (ref 0.0–0.2)

## 2022-09-27 MED ORDER — NIFEDIPINE ER OSMOTIC RELEASE 30 MG PO TB24
30.0000 mg | ORAL_TABLET | Freq: Two times a day (BID) | ORAL | Status: DC
  Administered 2022-09-27 – 2022-09-28 (×3): 30 mg via ORAL
  Filled 2022-09-27 (×3): qty 1

## 2022-09-27 MED ORDER — FUROSEMIDE 20 MG PO TABS
20.0000 mg | ORAL_TABLET | Freq: Every day | ORAL | Status: DC
  Administered 2022-09-27 – 2022-09-28 (×2): 20 mg via ORAL
  Filled 2022-09-27 (×2): qty 1

## 2022-09-27 MED ORDER — FERROUS SULFATE 325 (65 FE) MG PO TABS
325.0000 mg | ORAL_TABLET | ORAL | Status: DC
  Administered 2022-09-27: 325 mg via ORAL
  Filled 2022-09-27: qty 1

## 2022-09-27 NOTE — Progress Notes (Signed)
Post Partum Day 1 Subjective: Patient reports feeling well without complaints. She denies chest pain, SOB, lightheadedness/dizziness. She ambulated and tolerated a regular diet  Objective: Blood pressure (!) 145/85, pulse (!) 123, temperature 98.6 F (37 C), temperature source Oral, resp. rate 17, height 5\' 3"  (1.6 m), weight 100.7 kg, last menstrual period 12/30/2021, SpO2 98 %, unknown if currently breastfeeding.  Physical Exam:  General: alert, cooperative, and no distress Lochia: appropriate, vaginal packing removed Uterine Fundus: firm DVT Evaluation: No evidence of DVT seen on physical exam.  Recent Labs    09/26/22 1543 09/27/22 0536  HGB 10.5* 9.0*  HCT 30.8* 26.5*    Assessment/Plan: Patient is doing well PPD#1 s/p SVD with large EBL - patient hemodynamically stable - Encourage iron supplement - Will start lasix and procardia  - Will continue to monitor BP closely   LOS: 1 day   Catalina Antigua, MD 09/27/2022, 9:03 AM

## 2022-09-27 NOTE — Anesthesia Postprocedure Evaluation (Signed)
Anesthesia Post Note  Patient: [name] Curry  Procedure(s) Performed: AN AD HOC LABOR EPIDURAL     Patient location during evaluation: Mother Baby Anesthesia Type: Epidural Level of consciousness: awake and alert Pain management: pain level controlled Vital Signs Assessment: post-procedure vital signs reviewed and stable Respiratory status: spontaneous breathing, nonlabored ventilation and respiratory function stable Cardiovascular status: stable Postop Assessment: no headache, no backache and epidural receding Anesthetic complications: no   No notable events documented.  Last Vitals:  Vitals:   09/26/22 2321 09/27/22 0402  BP: 123/67 132/86  Pulse: (!) 107 97  Resp: 18 16  Temp: 37 C 36.8 C  SpO2: 99% 97%    Last Pain:  Vitals:   09/27/22 0619  TempSrc:   PainSc: 4                  Rasean Joos

## 2022-09-27 NOTE — Lactation Note (Signed)
This note was copied from a baby's chart. Lactation Consultation Note  Patient Name: [name] Curry ZOXWR'U Date: 09/27/2022 Age:40 hours Reason for consult: Initial assessment;Early term 37-38.6wks;Primapara Mom hadn't been putting baby to the breast to BF yet. Mom stated she would like to just BF once her milk comes in. Mom stated she hadn't been BF because she doesn't have anything milk yet in her breast. LC demonstrated hand expression and then mom hand expressed. A dot of colostrum noted. Explained consistency of colostrum verses mature milk.  Encouraged mom to put baby to the breast before giving formula. Mom does have areola edema. Reverse pressure demonstrated and how it softens areola. Shells given and mom put them on before LC left. Mom has DEBP kit in rm. LC spoke w/RN and she will set up DEBP for mom tonight. Newborn feeding habits, STS, I&O, positioning, supply and demand, reviewed. Mom encouraged to feed baby 8-12 times/24 hours and with feeding cues.  Suggested hand expression before latching to help bring colostrum down for baby to taste and good stimulation. Mom has [employer] DEBP at home.  Mom works from home and plans to hopefully just BF once her mature milk comes in. Encouraged mom to call for latch assistance when needed while in hospital before going home.  Maternal Data Has patient been taught Hand Expression?: Yes Does the patient have breastfeeding experience prior to this delivery?: No  Feeding Nipple Type: Slow - flow  LATCH Score       Type of Nipple: Everted at rest and after stimulation  Comfort (Breast/Nipple): Filling, red/small blisters or bruises, mild/mod discomfort (some areola edema)         Lactation Tools Discussed/Used Tools: Shells;Pump (LC gave mom shells and explained how and why to wear them.) Breast pump type: Double-Electric Breast Pump (will ask RN to set up DEBP.) Reason for Pumping: stimulation/baby dropped down to less than 6  lbs.  Interventions Interventions: Breast feeding basics reviewed;DEBP;Shells;Position options;LC Services brochure;Reverse pressure  Discharge    Consult Status Consult Status: Follow-up Date: 09/28/22 Follow-up type: In-patient    Charyl Dancer 09/27/2022, 9:23 PM

## 2022-09-28 ENCOUNTER — Other Ambulatory Visit (HOSPITAL_COMMUNITY): Payer: Self-pay

## 2022-09-28 ENCOUNTER — Encounter (HOSPITAL_BASED_OUTPATIENT_CLINIC_OR_DEPARTMENT_OTHER): Payer: Commercial Managed Care - HMO | Admitting: Obstetrics & Gynecology

## 2022-09-28 MED ORDER — IBUPROFEN 600 MG PO TABS
600.0000 mg | ORAL_TABLET | Freq: Four times a day (QID) | ORAL | 0 refills | Status: DC
  Filled 2022-09-28: qty 30, 8d supply, fill #0

## 2022-09-28 MED ORDER — FERROUS SULFATE 325 (65 FE) MG PO TABS
325.0000 mg | ORAL_TABLET | ORAL | 3 refills | Status: DC
  Filled 2022-09-28: qty 30, 60d supply, fill #0

## 2022-09-28 MED ORDER — NIFEDIPINE ER 30 MG PO TB24
30.0000 mg | ORAL_TABLET | Freq: Two times a day (BID) | ORAL | 1 refills | Status: DC
  Filled 2022-09-28: qty 60, 30d supply, fill #0

## 2022-09-28 MED ORDER — FUROSEMIDE 20 MG PO TABS
20.0000 mg | ORAL_TABLET | Freq: Every day | ORAL | 0 refills | Status: DC
  Filled 2022-09-28: qty 5, 5d supply, fill #0

## 2022-09-28 NOTE — Lactation Note (Signed)
This note was copied from a baby's chart. Lactation Consultation Note  Patient Name: [name] Curry ZOXWR'U Date: 09/28/2022 Age:40 hours  Reason for consult: Follow-up assessment;1st time breastfeeding;Exclusive pumping and bottle feeding;Early term 37-38.6wks;Mother's request  P1, GA 37w 2d, less than 6 lbs, AMA, 1500 EBL  Mother in cheerful spirits and excited to be going home today. Mother states she has been trying to latch and hand express milk for "[name] Curry". Mother says baby isn't latching and she is only getting drops of milk from the breast. Mother said her original goal was to only breastfeed her baby.  Advised mother to pump every 3 hours for 15 minutes with her DEBP to stimulate milk production Offer breast to baby frequently (8-12/24) and allow her to nuzzle, skin to skin, attempt to latch Feed baby her EBM followed by formula Mother has [facility] and plans to seek support with OP   Mother would like a referral made to OP LC at [facility] for an appointment to help her with achieving breastfeeding. Referral sent.  Feeding Mother's Current Feeding Choice: Breast Milk and Formula Nipple Type: Slow - flow     Interventions Interventions: Education;LC Services brochure  Discharge Discharge Education: Outpatient recommendation;Outpatient Epic message sent;Engorgement and breast care;Warning signs for feeding baby Pump: DEBP;Personal  Consult Status Consult Status: Complete    [name] Curry 09/28/2022, 1:11 PM

## 2022-09-28 NOTE — Plan of Care (Signed)
Pt to be discharged home with printed instructions. .me 

## 2022-09-29 ENCOUNTER — Encounter (HOSPITAL_BASED_OUTPATIENT_CLINIC_OR_DEPARTMENT_OTHER): Payer: Commercial Managed Care - HMO | Admitting: Obstetrics & Gynecology

## 2022-10-03 ENCOUNTER — Ambulatory Visit (INDEPENDENT_AMBULATORY_CARE_PROVIDER_SITE_OTHER): Payer: BLUE CROSS/BLUE SHIELD | Admitting: Orthopaedic Surgery

## 2022-10-03 DIAGNOSIS — G5603 Carpal tunnel syndrome, bilateral upper limbs: Secondary | ICD-10-CM

## 2022-10-03 MED ORDER — BUPIVACAINE HCL 0.25 % IJ SOLN
0.3300 mL | INTRAMUSCULAR | Status: AC | PRN
Start: 2022-10-03 — End: 2022-10-03
  Administered 2022-10-03: .33 mL

## 2022-10-03 MED ORDER — LIDOCAINE HCL 1 % IJ SOLN
1.0000 mL | INTRAMUSCULAR | Status: AC | PRN
Start: 2022-10-03 — End: 2022-10-03
  Administered 2022-10-03: 1 mL

## 2022-10-03 MED ORDER — METHYLPREDNISOLONE ACETATE 40 MG/ML IJ SUSP
40.0000 mg | INTRAMUSCULAR | Status: AC | PRN
Start: 2022-10-03 — End: 2022-10-03
  Administered 2022-10-03: 40 mg
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
## 2022-10-04 ENCOUNTER — Encounter (HOSPITAL_BASED_OUTPATIENT_CLINIC_OR_DEPARTMENT_OTHER): Payer: Commercial Managed Care - HMO | Admitting: Obstetrics & Gynecology

## 2022-10-05 ENCOUNTER — Encounter: Payer: Self-pay | Admitting: Internal Medicine

## 2022-10-05 ENCOUNTER — Other Ambulatory Visit: Payer: Self-pay | Admitting: Internal Medicine

## 2022-10-05 ENCOUNTER — Telehealth (HOSPITAL_COMMUNITY): Payer: Self-pay | Admitting: *Deleted

## 2022-10-05 DIAGNOSIS — E890 Postprocedural hypothyroidism: Secondary | ICD-10-CM

## 2022-10-05 MED ORDER — LEVOTHYROXINE SODIUM 100 MCG PO TABS: 100.0000 ug | ORAL_TABLET | Freq: Every day | ORAL | 3 refills | Status: DC

## 2022-10-05 NOTE — Telephone Encounter (Signed)
Mom reports feeling good. No concerns about herself at this time. EPDS=1 [facility] score=1) Mom reports baby is doing well. Feeding, peeing, and pooping without difficulty. Safe sleep reviewed. Mom reports no concerns about baby at present.  Duffy Rhody, RN 10-05-2022 at 1:09pm

## 2022-10-09 ENCOUNTER — Ambulatory Visit: Payer: 59 | Admitting: Internal Medicine

## 2022-10-10 ENCOUNTER — Encounter (HOSPITAL_BASED_OUTPATIENT_CLINIC_OR_DEPARTMENT_OTHER): Payer: Self-pay | Admitting: Obstetrics & Gynecology

## 2022-10-12 ENCOUNTER — Encounter (HOSPITAL_BASED_OUTPATIENT_CLINIC_OR_DEPARTMENT_OTHER): Payer: Commercial Managed Care - HMO | Admitting: Obstetrics & Gynecology

## 2022-10-18 ENCOUNTER — Ambulatory Visit (HOSPITAL_BASED_OUTPATIENT_CLINIC_OR_DEPARTMENT_OTHER): Payer: BLUE CROSS/BLUE SHIELD | Admitting: Medical

## 2022-10-18 ENCOUNTER — Encounter (HOSPITAL_BASED_OUTPATIENT_CLINIC_OR_DEPARTMENT_OTHER): Payer: Self-pay | Admitting: Medical

## 2022-10-18 ENCOUNTER — Ambulatory Visit (HOSPITAL_BASED_OUTPATIENT_CLINIC_OR_DEPARTMENT_OTHER): Payer: BLUE CROSS/BLUE SHIELD | Admitting: Orthopaedic Surgery

## 2022-10-18 VITALS — BP 117/93 | HR 71 | Wt 206.6 lb

## 2022-10-18 DIAGNOSIS — S3763XA Laceration of uterus, initial encounter: Secondary | ICD-10-CM | POA: Diagnosis not present

## 2022-10-18 NOTE — Progress Notes (Signed)
  History:  Ms. [name] Curry is a 42 y.o. G1P1001 who presents to clinic today for follow-up after vaginal delivery with iatrogenic cervical laceration during delivery of infant on 5/7. Patient states she is having only mild, intermittent pain. She denies bleeding currently. She was concerned about the amount of bleeding following delivery, but feels she has regained her energy. She is taking PO iron and PNV.    The following portions of the patient's history were reviewed and updated as appropriate: allergies, current medications, family history, past medical history, social history, past surgical history and problem list.  Review of Systems:  Review of Systems  Constitutional:  Negative for fever.  Gastrointestinal:  Negative for abdominal pain.  Genitourinary:        Neg - vaginal bleeding, discharge      Objective:  Physical Exam BP (!) 117/93 (BP Location: Left Arm, Patient Position: Sitting, Cuff Size: Large)   Pulse 71   Wt 206 lb 9.6 oz (93.7 kg)   LMP 12/30/2021   BMI 36.60 kg/m  Physical Exam Exam conducted with a chaperone present.  Constitutional:      Appearance: Normal appearance. She is obese. She is not ill-appearing.  Cardiovascular:     Rate and Rhythm: Normal rate.  Pulmonary:     Effort: Pulmonary effort is normal.  Genitourinary:    General: Normal vulva.     Cervix: No cervical motion tenderness, discharge, friability, erythema or cervical bleeding.     Skin:    General: Skin is warm and dry.     Findings: No erythema.  Neurological:     Mental Status: She is alert and oriented to person, place, and time.  Psychiatric:        Mood and Affect: Mood normal.      Health Maintenance Due  Topic Date Due   COVID-19 Vaccine (1) Never done    Labs, imaging and previous visits in Epic reviewed  Assessment & Plan:  1. Cervical laceration, initial encounter - Healing appropriately at this point in the PP period - No active bleeding or evidence of  infection  - Warning signs for worsening condition discussed  - Follow-up with [facility] as planned for routine PP or sooner PRN    No follow-ups on file.  Marny Lowenstein, PA-C 10/18/2022 3:09 PM

## 2022-10-19 ENCOUNTER — Encounter (HOSPITAL_BASED_OUTPATIENT_CLINIC_OR_DEPARTMENT_OTHER): Payer: Self-pay | Admitting: Obstetrics & Gynecology

## 2022-10-21 DIAGNOSIS — Z419 Encounter for procedure for purposes other than remedying health state, unspecified: Secondary | ICD-10-CM | POA: Diagnosis not present

## 2022-10-24 DIAGNOSIS — M654 Radial styloid tenosynovitis [de Quervain]: Secondary | ICD-10-CM | POA: Diagnosis not present

## 2022-10-26 ENCOUNTER — Encounter (HOSPITAL_BASED_OUTPATIENT_CLINIC_OR_DEPARTMENT_OTHER): Payer: Self-pay | Admitting: Obstetrics & Gynecology

## 2022-10-31 ENCOUNTER — Ambulatory Visit (HOSPITAL_BASED_OUTPATIENT_CLINIC_OR_DEPARTMENT_OTHER): Payer: BLUE CROSS/BLUE SHIELD | Admitting: Obstetrics & Gynecology

## 2022-10-31 ENCOUNTER — Encounter (HOSPITAL_BASED_OUTPATIENT_CLINIC_OR_DEPARTMENT_OTHER): Payer: Self-pay | Admitting: Obstetrics & Gynecology

## 2022-10-31 ENCOUNTER — Ambulatory Visit (INDEPENDENT_AMBULATORY_CARE_PROVIDER_SITE_OTHER): Payer: BLUE CROSS/BLUE SHIELD | Admitting: Advanced Practice Midwife

## 2022-10-31 DIAGNOSIS — Z8759 Personal history of other complications of pregnancy, childbirth and the puerperium: Secondary | ICD-10-CM

## 2022-10-31 DIAGNOSIS — S3763XA Laceration of uterus, initial encounter: Secondary | ICD-10-CM

## 2022-10-31 DIAGNOSIS — Z30017 Encounter for initial prescription of implantable subdermal contraceptive: Secondary | ICD-10-CM

## 2022-10-31 DIAGNOSIS — O133 Gestational [pregnancy-induced] hypertension without significant proteinuria, third trimester: Secondary | ICD-10-CM

## 2022-10-31 MED ORDER — ETONOGESTREL 68 MG ~~LOC~~ IMPL
68.0000 mg | DRUG_IMPLANT | Freq: Once | SUBCUTANEOUS | Status: AC
Start: 2022-10-31 — End: 2022-10-31
  Administered 2022-10-31: 68 mg via SUBCUTANEOUS

## 2022-10-31 NOTE — Progress Notes (Unsigned)
Post Partum Visit Note  [name] Curry is a 42 y.o. G18P1001 female who presents for a postpartum visit. She is {1-10:13787} {time; units:18646} postpartum following a {method of delivery:313099}.  I have fully reviewed the prenatal and intrapartum course. The delivery was at *** gestational weeks.  Anesthesia: {anesthesia types:812}. Postpartum course has been ***. Baby is doing well***. Baby is feeding by {breastmilk/bottle:69}. Bleeding {vag bleed:12292}. Bowel function is {normal:32111}. Bladder function is {normal:32111}. Patient {is/is not:9024} sexually active. Contraception method is {contraceptive method:5051}. Postpartum depression screening: {gen negative/positive:315881}.   The pregnancy intention screening data noted above was reviewed. Potential methods of contraception were discussed. The patient elected to proceed with No data recorded.    Health Maintenance Due  Topic Date Due   COVID-19 Vaccine (1) Never done    {Common ambulatory SmartLinks:19316}  Review of Systems {ros; complete:30496}  Objective:  BP 127/73 (BP Location: Right Arm, Patient Position: Sitting, Cuff Size: Large)   Pulse 68   Ht 5\' 3"  (1.6 m) Comment: Reported  Wt 206 lb 3.2 oz (93.5 kg)   LMP 12/30/2021   BMI 36.53 kg/m    VS reviewed, nursing note reviewed,  Constitutional: well developed, well nourished, no distress HEENT: normocephalic CV: normal rate Pulm/chest wall: normal effort Abdomen: soft Neuro: alert and oriented x 3 Skin: warm, dry Psych: affect normal   SSE:  Cervix with laceration repair at 6 o'clock, well approximated, no edema, pink granulation tissue at site   Nexplanon Insertion Procedure Patient identified, informed consent performed, consent signed.   Patient does understand that irregular bleeding is a very common side effect of this medication. She was advised to have backup contraception for one week after placement. Pregnancy test in clinic today was negative.   Appropriate time out taken.  Patient's RIGHT arm was prepped and draped in the usual sterile fashion.. The ruler used to measure and mark insertion area.  Patient was prepped with alcohol swab and then injected with 3 ml of 1% lidocaine.  She was prepped with betadine, Nexplanon removed from packaging,  Device confirmed in needle, then inserted full length of needle and withdrawn per handbook instructions. Nexplanon was able to palpated in the patient's arm; patient palpated the insert herself. There was minimal blood loss.  Patient insertion site covered with guaze and a pressure bandage to reduce any bruising.  The patient tolerated the procedure well and was given post procedure instructions.    Assessment:   1. Postpartum care following vaginal delivery ***  2. Cervical laceration, initial encounter ***  3. Gestational hypertension without significant proteinuria in third trimester ***  4. Nexplanon insertion ***   Plan:   Essential components of care per ACOG recommendations:  1.  Mood and well being: Patient with {gen negative/positive:315881} depression screening today. Reviewed local resources for support.  - Patient tobacco use? {tobacco use:25506}  - hx of drug use? {yes/no:25505}    2. Infant care and feeding:  -Patient currently breastmilk feeding? {yes/no:25502}  -Social determinants of health (SDOH) reviewed in EPIC. No concerns***The following needs were identified***  3. Sexuality, contraception and birth spacing - Patient {DOES_DOES XBJ:47829} want a pregnancy in the next year.  Desired family size is {NUMBER 1-10:22536} children.  - Reviewed reproductive life planning. Reviewed contraceptive methods based on pt preferences and effectiveness.  Patient desired {Up[employer] End Methods:24109} today.   - Discussed birth spacing of 18 months  4. Sleep and fatigue -Encouraged family/partner/community support of 4 hrs of uninterrupted sleep to  help with mood and  fatigue  5. Physical Recovery  - Discussed patients delivery and complications. She describes her labor as {description:25511} - Patient had a {CHL AMB DELIVERY:873 442 9797}. Patient had a {laceration:25518} laceration. Perineal healing reviewed. Patient expressed understanding - Patient has urinary incontinence? {yes/no:25515} - Patient {ACTION; IS/IS ZOX:09604540} safe to resume physical and sexual activity  6.  Health Maintenance - HM due items addressed {Yes or If no, why not?:20788} - Last pap smear  Diagnosis  Date Value Ref Range Status  03/08/2022   Final   - Negative for intraepithelial lesion or malignancy (NILM)   Pap smear {done:10129} at today's visit.  -Breast Cancer screening indicated? {indicated:25516}  7. Chronic Disease/Pregnancy Condition follow up: {Follow up:25499}  - PCP follow up  Sharen Counter, CNM [facility], [facility]

## 2022-11-07 ENCOUNTER — Ambulatory Visit: Payer: BLUE CROSS/BLUE SHIELD | Admitting: Nurse Practitioner

## 2022-11-08 ENCOUNTER — Encounter (HOSPITAL_BASED_OUTPATIENT_CLINIC_OR_DEPARTMENT_OTHER): Payer: Self-pay | Admitting: Obstetrics & Gynecology

## 2022-11-08 ENCOUNTER — Other Ambulatory Visit (HOSPITAL_BASED_OUTPATIENT_CLINIC_OR_DEPARTMENT_OTHER): Payer: Self-pay | Admitting: *Deleted

## 2022-11-08 MED ORDER — NIFEDIPINE ER 30 MG PO TB24: 30.0000 mg | ORAL_TABLET | Freq: Two times a day (BID) | ORAL | 1 refills | Status: DC

## 2022-11-08 MED ORDER — FERROUS SULFATE 325 (65 FE) MG PO TABS: 325.0000 mg | ORAL_TABLET | ORAL | 3 refills | Status: DC

## 2022-11-20 ENCOUNTER — Encounter (HOSPITAL_BASED_OUTPATIENT_CLINIC_OR_DEPARTMENT_OTHER): Payer: Self-pay | Admitting: Advanced Practice Midwife

## 2022-11-20 DIAGNOSIS — Z419 Encounter for procedure for purposes other than remedying health state, unspecified: Secondary | ICD-10-CM | POA: Diagnosis not present

## 2022-11-20 MED ORDER — NORETHINDRONE ACETATE 5 MG PO TABS: 5.0000 mg | ORAL_TABLET | Freq: Two times a day (BID) | ORAL | 0 refills | Status: DC

## 2022-11-28 NOTE — Telephone Encounter (Signed)
Called pt in response to [employer]. Pt reports that she is continuing to have heavy bleeding on and off despite adding oral medication. Pt provided with appt for evaluation/recommendations.

## 2022-11-29 ENCOUNTER — Ambulatory Visit (HOSPITAL_BASED_OUTPATIENT_CLINIC_OR_DEPARTMENT_OTHER): Payer: BLUE CROSS/BLUE SHIELD | Admitting: Obstetrics & Gynecology

## 2022-12-01 ENCOUNTER — Other Ambulatory Visit (HOSPITAL_BASED_OUTPATIENT_CLINIC_OR_DEPARTMENT_OTHER): Payer: Self-pay | Admitting: Obstetrics & Gynecology

## 2022-12-04 ENCOUNTER — Encounter (HOSPITAL_BASED_OUTPATIENT_CLINIC_OR_DEPARTMENT_OTHER): Payer: Self-pay | Admitting: Obstetrics & Gynecology

## 2022-12-04 DIAGNOSIS — E88819 Insulin resistance, unspecified: Secondary | ICD-10-CM | POA: Diagnosis not present

## 2022-12-04 DIAGNOSIS — E559 Vitamin D deficiency, unspecified: Secondary | ICD-10-CM | POA: Diagnosis not present

## 2022-12-04 DIAGNOSIS — D62 Acute posthemorrhagic anemia: Secondary | ICD-10-CM | POA: Diagnosis not present

## 2022-12-04 DIAGNOSIS — R7303 Prediabetes: Secondary | ICD-10-CM | POA: Diagnosis not present

## 2022-12-04 DIAGNOSIS — I10 Essential (primary) hypertension: Secondary | ICD-10-CM | POA: Diagnosis not present

## 2022-12-12 ENCOUNTER — Other Ambulatory Visit (HOSPITAL_BASED_OUTPATIENT_CLINIC_OR_DEPARTMENT_OTHER): Payer: Self-pay | Admitting: Obstetrics & Gynecology

## 2022-12-13 NOTE — Telephone Encounter (Signed)
LMOVM for pt to call regarding refill request

## 2022-12-21 DIAGNOSIS — Z419 Encounter for procedure for purposes other than remedying health state, unspecified: Secondary | ICD-10-CM | POA: Diagnosis not present

## 2022-12-27 ENCOUNTER — Ambulatory Visit (INDEPENDENT_AMBULATORY_CARE_PROVIDER_SITE_OTHER): Payer: BLUE CROSS/BLUE SHIELD | Admitting: Medical

## 2022-12-27 ENCOUNTER — Encounter (HOSPITAL_BASED_OUTPATIENT_CLINIC_OR_DEPARTMENT_OTHER): Payer: Self-pay | Admitting: Medical

## 2022-12-27 VITALS — BP 139/80 | HR 72 | Ht 63.0 in | Wt 217.6 lb

## 2022-12-27 DIAGNOSIS — Z3046 Encounter for surveillance of implantable subdermal contraceptive: Secondary | ICD-10-CM | POA: Diagnosis not present

## 2022-12-27 DIAGNOSIS — N939 Abnormal uterine and vaginal bleeding, unspecified: Secondary | ICD-10-CM | POA: Diagnosis not present

## 2022-12-27 MED ORDER — MEDROXYPROGESTERONE ACETATE 10 MG PO TABS
10.0000 mg | ORAL_TABLET | Freq: Every day | ORAL | 0 refills | Status: DC
Start: 2022-12-27 — End: 2023-01-18

## 2022-12-27 NOTE — Progress Notes (Signed)
  GYNECOLOGY CLINIC PROCEDURE NOTE  Nexplanon Removal Patient was given informed consent for removal of her Nexplanon.  Appropriate time out taken. Nexplanon site identified.  Area prepped in usual sterile fashon. Three ml of 1% lidocaine was used to anesthetize the area at the distal end of the implant. A small stab incision was made right beside the implant on the distal portion.  The Nexplanon rod was grasped and removed without difficulty.  There was minimal blood loss. There were no complications.  Steri-strips were applied over the small incision.  A pressure bandage was applied to reduce any bruising.  The patient tolerated the procedure well and was given post procedure instructions.  Patient is planning to use condoms for contraception until she can have BTL. Consent signed today for BTL. We discussed the patient's desire for permanent sterilization and she is agreeable to signing consent and scheduling at first available surgery time.  Rx Provera sent to help stop vaginal bleeding. Discussed that withdrawal bleed is likely approximately 2 weeks post medication and then hopefully regular periods will resume.   Patient should call with any unexpected complications.   Vonzella Nipple, PA-C 12/27/2022 10:39 AM

## 2023-01-03 ENCOUNTER — Ambulatory Visit: Payer: BLUE CROSS/BLUE SHIELD | Admitting: Internal Medicine

## 2023-01-08 ENCOUNTER — Other Ambulatory Visit: Payer: Self-pay | Admitting: Internal Medicine

## 2023-01-08 ENCOUNTER — Other Ambulatory Visit (INDEPENDENT_AMBULATORY_CARE_PROVIDER_SITE_OTHER): Payer: BLUE CROSS/BLUE SHIELD

## 2023-01-08 DIAGNOSIS — E890 Postprocedural hypothyroidism: Secondary | ICD-10-CM | POA: Diagnosis not present

## 2023-01-08 LAB — T4, FREE: Free T4: 0.79 ng/dL (ref 0.60–1.60)

## 2023-01-08 LAB — TSH: TSH: 1.9 u[IU]/mL (ref 0.35–5.50)

## 2023-01-09 ENCOUNTER — Telehealth: Payer: Self-pay

## 2023-01-10 NOTE — Telephone Encounter (Signed)
Called to schedule patient's procedure with Dr. Hyacinth Meeker. Surgery scheduled for 01/31/23 @[facility]  @1  pm, and patient agreed to arrive at 11 am for pre-op. Pre-op instructions and surgery details provided over the phone and patient verbalized understanding

## 2023-01-12 ENCOUNTER — Encounter (HOSPITAL_BASED_OUTPATIENT_CLINIC_OR_DEPARTMENT_OTHER): Payer: Self-pay

## 2023-01-13 ENCOUNTER — Other Ambulatory Visit (HOSPITAL_BASED_OUTPATIENT_CLINIC_OR_DEPARTMENT_OTHER): Payer: Self-pay | Admitting: Obstetrics & Gynecology

## 2023-01-13 DIAGNOSIS — Z01812 Encounter for preprocedural laboratory examination: Secondary | ICD-10-CM

## 2023-01-18 ENCOUNTER — Other Ambulatory Visit: Payer: Self-pay

## 2023-01-18 ENCOUNTER — Encounter (HOSPITAL_BASED_OUTPATIENT_CLINIC_OR_DEPARTMENT_OTHER): Payer: Self-pay | Admitting: Obstetrics & Gynecology

## 2023-01-18 NOTE — Progress Notes (Signed)
Spoke w/ via phone for pre-op interview---pt Lab needs dos----cbc, t & s, urine preg , ekg              Lab results------lov cardiology dr  b Cristal Deer 10-07-2021, echo 06-06-2021 epic COVID test -----patient states asymptomatic no test needed Arrive at -------1115 am 01-31-2023 NPO after MN NO Solid Food.  Clear liquids from MN until---1015 Med rec completed Medications to take morning of surgery -----levothyroxine Diabetic medication -----n/a Patient instructed no nail polish to be worn day of surgery Patient instructed to bring photo id and insurance card day of surgery Patient aware to have Driver (ride ) / caregiver    for 24 hours after surgery [name] Curry husband , Patient Special Instructions -----none Pre-Op special Instructions -----none Patient verbalized understanding of instructions that were given at this phone interview. Patient denies shortness of breath, chest pain, fever, cough at this phone interview.

## 2023-01-21 DIAGNOSIS — Z419 Encounter for procedure for purposes other than remedying health state, unspecified: Secondary | ICD-10-CM | POA: Diagnosis not present

## 2023-01-30 ENCOUNTER — Telehealth: Payer: Self-pay

## 2023-01-30 NOTE — Telephone Encounter (Signed)
I called patient to advise her insurance plan was OON. Patient is scheduled for surgery on 01/31/23 w/ Dr. Hyacinth Meeker. I asked patient to call me to confirm insurance plan. [phone].

## 2023-01-30 NOTE — Telephone Encounter (Signed)
Patient called back and stated the hospital will allow the surgery for 9/11/124 but medicaid is asking for pre authorization. I provided the information to the provider and pre-authorization team.

## 2023-01-30 NOTE — Telephone Encounter (Signed)
Patient returned my call and stated she cancelled the [employer] today and the hospital agreed to allow her to have the surgery. Patient states she also spoke with Medicaid and advised once the bill is received, she can re-file the claim if Medicaid is showing primary on 01/31/23.

## 2023-01-30 NOTE — Telephone Encounter (Signed)
Pt would like case rescheduled for 02/14/23. This will give Medicaid time to update as primary. I advised Central Scheduling is closed, but I will reschedule her case tomorrow morning and call her w/ details.

## 2023-01-31 ENCOUNTER — Other Ambulatory Visit (HOSPITAL_BASED_OUTPATIENT_CLINIC_OR_DEPARTMENT_OTHER): Payer: Self-pay | Admitting: Obstetrics & Gynecology

## 2023-01-31 DIAGNOSIS — Z01818 Encounter for other preprocedural examination: Secondary | ICD-10-CM

## 2023-02-01 ENCOUNTER — Encounter (HOSPITAL_BASED_OUTPATIENT_CLINIC_OR_DEPARTMENT_OTHER): Payer: Self-pay

## 2023-02-01 DIAGNOSIS — M5137 Other intervertebral disc degeneration, lumbosacral region: Secondary | ICD-10-CM | POA: Diagnosis not present

## 2023-02-01 DIAGNOSIS — M461 Sacroiliitis, not elsewhere classified: Secondary | ICD-10-CM | POA: Diagnosis not present

## 2023-02-01 DIAGNOSIS — M160 Bilateral primary osteoarthritis of hip: Secondary | ICD-10-CM | POA: Diagnosis not present

## 2023-02-07 ENCOUNTER — Encounter (HOSPITAL_BASED_OUTPATIENT_CLINIC_OR_DEPARTMENT_OTHER): Payer: Self-pay | Admitting: Obstetrics & Gynecology

## 2023-02-07 ENCOUNTER — Other Ambulatory Visit: Payer: Self-pay

## 2023-02-07 NOTE — Progress Notes (Signed)
Spoke w/ via phone for pre-op interview---pt Lab needs dos----cbc, t & s, urine preg             Lab results------lov cardiology dr  b Cristal Deer  ( saw for palpitations) 10-07-2021, echo 06-06-2021 epic COVID test -----patient states asymptomatic no test needed Arrive at -------1130 am 02-14-2023 NPO after MN NO Solid Food.  Clear liquids from MN until---1030 Med rec completed Medications to take morning of surgery -----levothyroxine Diabetic medication -----n/a Patient instructed no nail polish to be worn day of surgery Patient instructed to bring photo id and insurance card day of surgery Patient aware to have Driver (ride ) / caregiver    for 24 hours after surgery [name] Curry husband , Patient Special Instructions -----none Pre-Op special Instructions -----none Patient verbalized understanding of instructions that were given at this phone interview. Patient denies shortness of breath, chest pain, fever, cough at this phone interview.

## 2023-02-08 ENCOUNTER — Ambulatory Visit: Payer: Medicaid Other | Attending: Registered Nurse | Admitting: Physical Therapy

## 2023-02-08 ENCOUNTER — Encounter: Payer: Self-pay | Admitting: Physical Therapy

## 2023-02-08 ENCOUNTER — Other Ambulatory Visit: Payer: Self-pay

## 2023-02-08 DIAGNOSIS — M6281 Muscle weakness (generalized): Secondary | ICD-10-CM | POA: Diagnosis not present

## 2023-02-08 DIAGNOSIS — M62838 Other muscle spasm: Secondary | ICD-10-CM | POA: Diagnosis not present

## 2023-02-08 DIAGNOSIS — M25551 Pain in right hip: Secondary | ICD-10-CM | POA: Diagnosis not present

## 2023-02-08 DIAGNOSIS — M5459 Other low back pain: Secondary | ICD-10-CM

## 2023-02-08 DIAGNOSIS — M25552 Pain in left hip: Secondary | ICD-10-CM | POA: Diagnosis not present

## 2023-02-08 DIAGNOSIS — G8929 Other chronic pain: Secondary | ICD-10-CM | POA: Insufficient documentation

## 2023-02-08 DIAGNOSIS — M545 Low back pain, unspecified: Secondary | ICD-10-CM | POA: Insufficient documentation
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
## 2023-02-12 ENCOUNTER — Telehealth: Payer: Self-pay

## 2023-02-12 NOTE — Telephone Encounter (Signed)
Patient called to see if her BCBS plan was still showing active? I advised in registration we only have her Medicaid ins. After clicking on the Medicaid plan it does list the patient also has a Insurance underwriter. Patient canceled the [facility] plan on 01/30/23.

## 2023-02-14 ENCOUNTER — Ambulatory Visit (HOSPITAL_BASED_OUTPATIENT_CLINIC_OR_DEPARTMENT_OTHER): Payer: Medicaid Other | Admitting: Anesthesiology

## 2023-02-14 ENCOUNTER — Other Ambulatory Visit: Payer: Self-pay

## 2023-02-14 ENCOUNTER — Encounter (HOSPITAL_BASED_OUTPATIENT_CLINIC_OR_DEPARTMENT_OTHER): Payer: Self-pay | Admitting: Obstetrics & Gynecology

## 2023-02-14 ENCOUNTER — Ambulatory Visit (HOSPITAL_BASED_OUTPATIENT_CLINIC_OR_DEPARTMENT_OTHER)
Admission: RE | Admit: 2023-02-14 | Discharge: 2023-02-14 | Disposition: A | Discharge status: Home / Self Care | Payer: Medicaid Other | Attending: Obstetrics & Gynecology | Admitting: Obstetrics & Gynecology

## 2023-02-14 ENCOUNTER — Encounter (HOSPITAL_BASED_OUTPATIENT_CLINIC_OR_DEPARTMENT_OTHER)
Admission: RE | Disposition: A | Discharge status: Home / Self Care | Payer: Self-pay | Source: Home / Self Care | Attending: Obstetrics & Gynecology

## 2023-02-14 DIAGNOSIS — E039 Hypothyroidism, unspecified: Secondary | ICD-10-CM

## 2023-02-14 DIAGNOSIS — Z01818 Encounter for other preprocedural examination: Secondary | ICD-10-CM

## 2023-02-14 DIAGNOSIS — Z302 Encounter for sterilization: Secondary | ICD-10-CM

## 2023-02-14 DIAGNOSIS — E785 Hyperlipidemia, unspecified: Secondary | ICD-10-CM

## 2023-02-14 DIAGNOSIS — Z640 Problems related to unwanted pregnancy: Secondary | ICD-10-CM

## 2023-02-14 DIAGNOSIS — I10 Essential (primary) hypertension: Secondary | ICD-10-CM | POA: Insufficient documentation

## 2023-02-14 DIAGNOSIS — D563 Thalassemia minor: Secondary | ICD-10-CM | POA: Insufficient documentation

## 2023-02-14 DIAGNOSIS — Z9151 Personal history of suicidal behavior: Secondary | ICD-10-CM | POA: Diagnosis not present

## 2023-02-14 DIAGNOSIS — Z87891 Personal history of nicotine dependence: Secondary | ICD-10-CM | POA: Diagnosis not present

## 2023-02-14 DIAGNOSIS — Z6841 Body Mass Index (BMI) 40.0 and over, adult: Secondary | ICD-10-CM

## 2023-02-14 DIAGNOSIS — E119 Type 2 diabetes mellitus without complications: Secondary | ICD-10-CM

## 2023-02-14 DIAGNOSIS — Z01812 Encounter for preprocedural laboratory examination: Secondary | ICD-10-CM

## 2023-02-14 DIAGNOSIS — Z3141 Encounter for fertility testing: Secondary | ICD-10-CM | POA: Diagnosis not present

## 2023-02-14 HISTORY — DX: Unspecified osteoarthritis, unspecified site: M19.90

## 2023-02-14 HISTORY — DX: Palpitations: R00.2

## 2023-02-14 HISTORY — DX: Gestational diabetes mellitus in pregnancy, unspecified control: O24.419

## 2023-02-14 HISTORY — PX: LAPAROSCOPIC BILATERAL SALPINGECTOMY: SHX5889

## 2023-02-14 LAB — CBC
HCT: 43.2 % (ref 36.0–46.0)
Hemoglobin: 13.9 g/dL (ref 12.0–15.0)
MCH: 24.9 pg — ABNORMAL LOW (ref 26.0–34.0)
MCHC: 32.2 g/dL (ref 30.0–36.0)
MCV: 77.3 fL — ABNORMAL LOW (ref 80.0–100.0)
Platelets: 294 10*3/uL (ref 150–400)
RBC: 5.59 MIL/uL — ABNORMAL HIGH (ref 3.87–5.11)
RDW: 14.4 % (ref 11.5–15.5)
WBC: 7.4 10*3/uL (ref 4.0–10.5)
nRBC: 0 % (ref 0.0–0.2)

## 2023-02-14 LAB — TYPE AND SCREEN
ABO/RH(D): A POS
Antibody Screen: NEGATIVE

## 2023-02-14 LAB — GLUCOSE, CAPILLARY: Glucose-Capillary: 82 mg/dL (ref 70–99)

## 2023-02-14 LAB — POCT PREGNANCY, URINE: Preg Test, Ur: NEGATIVE

## 2023-02-14 SURGERY — SALPINGECTOMY, BILATERAL, LAPAROSCOPIC
Anesthesia: General | Site: Pelvis | Laterality: Bilateral

## 2023-02-14 MED ORDER — SUGAMMADEX SODIUM 200 MG/2ML IV SOLN
INTRAVENOUS | Status: DC | PRN
  Administered 2023-02-14: 250 mg via INTRAVENOUS

## 2023-02-14 MED ORDER — DEXAMETHASONE SODIUM PHOSPHATE 10 MG/ML IJ SOLN
INTRAMUSCULAR | Status: DC | PRN
  Administered 2023-02-14: 10 mg via INTRAVENOUS

## 2023-02-14 MED ORDER — OXYCODONE HCL 5 MG PO TABS: 5.0000 mg | ORAL_TABLET | Freq: Once | ORAL | Status: DC | PRN

## 2023-02-14 MED ORDER — FENTANYL CITRATE (PF) 100 MCG/2ML IJ SOLN
INTRAMUSCULAR | Status: AC
  Filled 2023-02-14: qty 2

## 2023-02-14 MED ORDER — ACETAMINOPHEN 500 MG PO TABS
1000.0000 mg | ORAL_TABLET | ORAL | Status: AC
  Administered 2023-02-14: 1000 mg via ORAL

## 2023-02-14 MED ORDER — PHENYLEPHRINE 80 MCG/ML (10ML) SYRINGE FOR IV PUSH (FOR BLOOD PRESSURE SUPPORT)
PREFILLED_SYRINGE | INTRAVENOUS | Status: AC
  Filled 2023-02-14: qty 10

## 2023-02-14 MED ORDER — IBUPROFEN 800 MG PO TABS: 800.0000 mg | ORAL_TABLET | Freq: Three times a day (TID) | ORAL | 0 refills | Status: DC | PRN

## 2023-02-14 MED ORDER — FENTANYL CITRATE (PF) 100 MCG/2ML IJ SOLN
INTRAMUSCULAR | Status: DC | PRN
  Administered 2023-02-14: 25 ug via INTRAVENOUS
  Administered 2023-02-14: 50 ug via INTRAVENOUS
  Administered 2023-02-14 (×2): 25 ug via INTRAVENOUS
  Administered 2023-02-14: 50 ug via INTRAVENOUS
  Administered 2023-02-14: 25 ug via INTRAVENOUS

## 2023-02-14 MED ORDER — MIDAZOLAM HCL 2 MG/2ML IJ SOLN
INTRAMUSCULAR | Status: AC
  Filled 2023-02-14: qty 2

## 2023-02-14 MED ORDER — LACTATED RINGERS IV SOLN
INTRAVENOUS | Status: DC
  Administered 2023-02-14: 1000 mL via INTRAVENOUS

## 2023-02-14 MED ORDER — 0.9 % SODIUM CHLORIDE (POUR BTL) OPTIME
TOPICAL | Status: DC | PRN
Start: 2023-02-14 — End: 2023-02-14
  Administered 2023-02-14: 500 mL

## 2023-02-14 MED ORDER — KETOROLAC TROMETHAMINE 30 MG/ML IJ SOLN
INTRAMUSCULAR | Status: DC | PRN
Start: 2023-02-14 — End: 2023-02-14
  Administered 2023-02-14: 30 mg via INTRAVENOUS

## 2023-02-14 MED ORDER — ACETAMINOPHEN 500 MG PO TABS
ORAL_TABLET | ORAL | Status: AC
  Filled 2023-02-14: qty 2

## 2023-02-14 MED ORDER — HYDROCODONE-ACETAMINOPHEN 5-325 MG PO TABS
1.0000 | ORAL_TABLET | Freq: Four times a day (QID) | ORAL | 0 refills | Status: DC | PRN
Start: 2023-02-14 — End: 2023-03-27

## 2023-02-14 MED ORDER — ROCURONIUM BROMIDE 10 MG/ML (PF) SYRINGE
PREFILLED_SYRINGE | INTRAVENOUS | Status: DC | PRN
  Administered 2023-02-14: 70 mg via INTRAVENOUS
  Administered 2023-02-14: 20 mg via INTRAVENOUS

## 2023-02-14 MED ORDER — ONDANSETRON HCL 4 MG/2ML IJ SOLN
INTRAMUSCULAR | Status: AC
  Filled 2023-02-14: qty 4

## 2023-02-14 MED ORDER — ONDANSETRON HCL 4 MG/2ML IJ SOLN
INTRAMUSCULAR | Status: DC | PRN
  Administered 2023-02-14: 4 mg via INTRAVENOUS

## 2023-02-14 MED ORDER — LIDOCAINE HCL (PF) 2 % IJ SOLN
INTRAMUSCULAR | Status: AC
  Filled 2023-02-14: qty 5

## 2023-02-14 MED ORDER — OXYCODONE HCL 5 MG/5ML PO SOLN: 5.0000 mg | Freq: Once | ORAL | Status: DC | PRN

## 2023-02-14 MED ORDER — KETOROLAC TROMETHAMINE 30 MG/ML IJ SOLN
INTRAMUSCULAR | Status: AC
  Filled 2023-02-14: qty 1

## 2023-02-14 MED ORDER — PROPOFOL 10 MG/ML IV BOLUS
INTRAVENOUS | Status: AC
  Filled 2023-02-14: qty 20

## 2023-02-14 MED ORDER — MIDAZOLAM HCL 2 MG/2ML IJ SOLN
INTRAMUSCULAR | Status: DC | PRN
  Administered 2023-02-14: 2 mg via INTRAVENOUS

## 2023-02-14 MED ORDER — LIDOCAINE 2% (20 MG/ML) 5 ML SYRINGE
INTRAMUSCULAR | Status: DC | PRN
  Administered 2023-02-14: 100 mg via INTRAVENOUS

## 2023-02-14 MED ORDER — DEXAMETHASONE SODIUM PHOSPHATE 10 MG/ML IJ SOLN
INTRAMUSCULAR | Status: AC
  Filled 2023-02-14: qty 1

## 2023-02-14 MED ORDER — ROCURONIUM BROMIDE 10 MG/ML (PF) SYRINGE
PREFILLED_SYRINGE | INTRAVENOUS | Status: AC
  Filled 2023-02-14: qty 10

## 2023-02-14 MED ORDER — PROMETHAZINE HCL 25 MG/ML IJ SOLN: 6.2500 mg | INTRAMUSCULAR | Status: DC | PRN

## 2023-02-14 MED ORDER — BUPIVACAINE HCL 0.25 % IJ SOLN
INTRAMUSCULAR | Status: DC | PRN
  Administered 2023-02-14: 11 mL

## 2023-02-14 MED ORDER — FENTANYL CITRATE (PF) 100 MCG/2ML IJ SOLN: 25.0000 ug | INTRAMUSCULAR | Status: DC | PRN

## 2023-02-14 MED ORDER — PROPOFOL 10 MG/ML IV BOLUS
INTRAVENOUS | Status: DC | PRN
  Administered 2023-02-14: 200 mg via INTRAVENOUS

## 2023-02-14 MED ORDER — DEXMEDETOMIDINE HCL IN NACL 80 MCG/20ML IV SOLN
INTRAVENOUS | Status: DC | PRN
Start: 2023-02-14 — End: 2023-02-14
  Administered 2023-02-14 (×2): 8 ug via INTRAVENOUS

## 2023-02-14 SURGICAL SUPPLY — 72 items
ADH SKN CLS APL DERMABOND .7 (GAUZE/BANDAGES/DRESSINGS) ×1
APL SKNCLS STERI-STRIP NONHPOA (GAUZE/BANDAGES/DRESSINGS)
APL SRG 38 LTWT LNG FL B (MISCELLANEOUS)
APL SWBSTK 6 STRL LF DISP (MISCELLANEOUS) ×4
APPLICATOR ARISTA FLEXITIP XL (MISCELLANEOUS) IMPLANT
APPLICATOR COTTON TIP 6 STRL (MISCELLANEOUS) IMPLANT
APPLICATOR COTTON TIP 6IN STRL (MISCELLANEOUS) ×4
BENZOIN TINCTURE PRP APPL 2/3 (GAUZE/BANDAGES/DRESSINGS) IMPLANT
BLADE CLIPPER SENSICLIP SURGIC (BLADE) IMPLANT
Bladeless trocar 150mm IMPLANT
CABLE HIGH FREQUENCY MONO STRZ (ELECTRODE) IMPLANT
CATH ROBINSON RED A/P 16FR (CATHETERS) IMPLANT
COVER MAYO STAND STRL (DRAPES) ×1 IMPLANT
DERMABOND ADVANCED .7 DNX12 (GAUZE/BANDAGES/DRESSINGS) ×1 IMPLANT
DRAPE SURG IRRIG POUCH 19X23 (DRAPES) ×1 IMPLANT
DRSG COVADERM PLUS 2X2 (GAUZE/BANDAGES/DRESSINGS) IMPLANT
DRSG OPSITE POSTOP 3X4 (GAUZE/BANDAGES/DRESSINGS) IMPLANT
DRSG TELFA 3X8 NADH STRL (GAUZE/BANDAGES/DRESSINGS) IMPLANT
DURAPREP 26ML APPLICATOR (WOUND CARE) ×1 IMPLANT
GAUZE 4X4 16PLY ~~LOC~~+RFID DBL (SPONGE) ×3 IMPLANT
GLOVE BIOGEL PI IND STRL 7.0 (GLOVE) ×3 IMPLANT
GLOVE ECLIPSE 6.5 STRL STRAW (GLOVE) ×2 IMPLANT
GOWN STRL REUS W/TWL LRG LVL3 (GOWN DISPOSABLE) ×2 IMPLANT
HEMOSTAT ARISTA ABSORB 3G PWDR (HEMOSTASIS) IMPLANT
IRRIG SUCT STRYKERFLOW 2 WTIP (MISCELLANEOUS)
IRRIGATION SUCT STRKRFLW 2 WTP (MISCELLANEOUS) ×1 IMPLANT
KIT PINK PAD W/HEAD ARE REST (MISCELLANEOUS) ×1
KIT PINK PAD W/HEAD ARM REST (MISCELLANEOUS) ×1 IMPLANT
KIT TURNOVER CYSTO (KITS) ×1 IMPLANT
LIGASURE VESSEL 5MM BLUNT TIP (ELECTROSURGICAL) IMPLANT
NDL HYPO 25X1 1.5 SAFETY (NEEDLE) ×1 IMPLANT
NDL INSUFFLATION 14GA 120MM (NEEDLE) ×1 IMPLANT
NDL INSUFFLATION 14GA 150MM (NEEDLE) IMPLANT
NEEDLE HYPO 25X1 1.5 SAFETY (NEEDLE) ×1 IMPLANT
NEEDLE INSUFFLATION 14GA 120MM (NEEDLE) ×1 IMPLANT
NEEDLE INSUFFLATION 14GA 150MM (NEEDLE) IMPLANT
NS IRRIG 500ML POUR BTL (IV SOLUTION) ×1 IMPLANT
PACK LAPAROSCOPY BASIN (CUSTOM PROCEDURE TRAY) ×1 IMPLANT
PAD OB MATERNITY 4.3X12.25 (PERSONAL CARE ITEMS) ×1 IMPLANT
POUCH LAPAROSCOPIC INSTRUMENT (MISCELLANEOUS) ×1 IMPLANT
PROTECTOR NERVE ULNAR (MISCELLANEOUS) ×1 IMPLANT
SCISSORS LAP 5X35 DISP (ENDOMECHANICALS) IMPLANT
SEALER TISSUE G2 CVD JAW 35 (ENDOMECHANICALS) IMPLANT
SET TUBE SMOKE EVAC HIGH FLOW (TUBING) ×1 IMPLANT
SHEARS HARMONIC 36 ACE (MISCELLANEOUS) IMPLANT
SLEEVE ADV FIXATION 5X100MM (TROCAR) ×1 IMPLANT
SLEEVE SCD COMPRESS KNEE MED (STOCKING) ×1 IMPLANT
SLEEVE Z-THREAD 5X100MM (TROCAR) IMPLANT
SOL ELECTROSURG ANTI STICK (MISCELLANEOUS)
SOLUTION ELECTROSURG ANTI STCK (MISCELLANEOUS) IMPLANT
SPIKE FLUID TRANSFER (MISCELLANEOUS) ×1 IMPLANT
STRIP CLOSURE SKIN 1/4X4 (GAUZE/BANDAGES/DRESSINGS) IMPLANT
SUT VIC AB 4-0 PS2 18 (SUTURE) ×1 IMPLANT
SUT VIC AB 4-0 SH 27 (SUTURE)
SUT VIC AB 4-0 SH 27XANBCTRL (SUTURE) IMPLANT
SUT VICRYL 0 UR6 27IN ABS (SUTURE) IMPLANT
SYR 10ML LL (SYRINGE) ×1 IMPLANT
SYR 30ML LL (SYRINGE) IMPLANT
SYR 3ML 23GX1 SAFETY (SYRINGE) IMPLANT
SYS BAG RETRIEVAL 10MM (BASKET)
SYSTEM BAG RETRIEVAL 10MM (BASKET) IMPLANT
SYSTEM CARTER THOMASON II (TROCAR) IMPLANT
TOWEL OR 17X24 6PK STRL BLUE (TOWEL DISPOSABLE) ×1 IMPLANT
TRAY FOLEY W/BAG SLVR 14FR LF (SET/KITS/TRAYS/PACK) IMPLANT
TROCAR ADV FIXATION 5X100MM (TROCAR) ×1 IMPLANT
TROCAR KII 8X100ML NONTHREADED (TROCAR) IMPLANT
TROCAR KII BLADELESS 5X150 (TROCAR) IMPLANT
TROCAR Z-THREAD FIOS 11X100 BL (TROCAR) IMPLANT
TROCAR Z-THREAD FIOS 5X100MM (TROCAR) ×1 IMPLANT
TUBE CONNECTING 12X1/4 (SUCTIONS) IMPLANT
WARMER LAPAROSCOPE (MISCELLANEOUS) ×1 IMPLANT
WATER STERILE IRR 500ML POUR (IV SOLUTION) ×1 IMPLANT

## 2023-02-14 NOTE — Op Note (Signed)
02/14/2023  1:57 PM  PATIENT:  [name] Curry  42 y.o. female  PRE-OPERATIVE DIAGNOSIS:  Undesired Fertility  POST-OPERATIVE DIAGNOSIS:  Undesired Fertility  PROCEDURE:  Procedure(s): LAPAROSCOPIC BILATERAL SALPINGECTOMY  SURGEON:  Jerene Bears  ASSISTANTS: Heather, RNFA  ANESTHESIA:   general  ESTIMATED BLOOD LOSS: 10 mL  BLOOD ADMINISTERED:none   FLUIDS: 700cc LR  UOP: 50cc clear UOP  SPECIMEN:  bilateral fallopian tubes  DISPOSITION OF SPECIMEN:  PATHOLOGY  FINDINGS: normal uterus, normal appearing ovaries, normal appearing fallopian tubes, normal liver and gall bladder  DESCRIPTION OF OPERATION: Patient is taken to the operating room. She is placed in the supine position. She is a running IV in place. Informed consent was present on the chart. SCDs on her lower extremities and functioning properly. Patient was positioned while she was awake.  Her legs were placed in the low lithotomy position in Cicero stirrups. Her arms were tucked by the side.  General endotracheal anesthesia was administered by the anesthesia staff without difficulty. Dr. Isaias Cowman, anesthesia, oversaw case.  Time out performed.    Dura prep was then used to prep the abdomen and Hibiclens was used to prep the inner thighs, perineum and vagina. Once 3 minutes had past the patient was draped in a normal standard fashion. The legs were lifted to the high lithotomy position. The cervix was visualized by placing a speculum in the vagina.  The anterior lip of the cervix was grasped with single-tooth tenaculum.  A hulka clamp was passed through the cervix and attached to the anterior lip of the vagina.  The tenaculum and speculum were removed.  Good manipulation was present.     The umbilicus was everted.  Marcaine 0.25% used to anesthetize the skin.  Using #11 blade, 5mm skin incision was made.  A Veress needle was obtained. Syringe of sterile saline was placed on a open Veress needle.  With the abdomen elevated,  the Veress needle was passed into the umbilicus until the pop was heard and then fluid started to drip.  Then low flow CO2 gas was attached but pressures were elevated so the Veress needle was removed.  This was attempted a second time without success.  Decision made to attempt left upper quadrant entry.  OG tube was placed by anesthesia.  Incision made 2cm beneath the costal margin in the midclavicular line.  The normal length trochar was not long enough so longer trochar was used.  Small fascial opening was noted.  Attempt to achieve enough pneumoperitoneum through.  Pressures again elevated quickly so trochar was removed.  However, it did feel that some pneumoperitoneum was achieved.  Veress needle was obtained again an in a similar fashion passed, through the umbilicus.  Fluid dripped easily and pneumoperitoneum was achieved.  Then the midline port was placed without difficulty.  The laparoscope was then used to confirm intraperitoneal placement. Findings noted above.  No bowel injury was noted.  LUQ port incision did have a small peritoneal opening but this was small and hemostatic.  Locations for RLQ, LLQ, and suprapubic ports were noted by transillumination of the abdominal wall.  0.25% marcaine was used to anesthetize the skin.  5mm skin incisions were made in the RLQ and LLQ and 5mm no bladed trochars and ports were placed underdirect visualization of the laparoscope.    Uterus was visualized after pt was placed in trendelenburg positioning.  es.  Attention was turned to the right side. With uterus on stretch the right tube was excised  off the ovary and mesosalpinx was dissected to free the tube. Then it was cauterized and incised at the cornua and flush with the uterus.  This tube was delivered through the incision  Ligasure device was used for this portion of the procedure.  Then in a similar fashion the left tube was freed from the ovary and the mesosalpinx incised.  Then it was cauterized flush with  the uterus and incised.  This tube was removed from the pelvis as well.  The pelvic was irrigated.  No bleeding was noted.  The remaining instruments were removed.  The ports (except the suprapubic port) were removed under direct visualization of the laparoscope and the pneumoperitoneum was relieved.  The patient was taken out of Trendelenburg positioning.  Several deep breaths were given to the patient's trying to any gas the abdomen and finally the suprapubic port was removed.  The skin was then closed with subcuticular stitches of 3-0 Vicryl. The skin was cleansed Dermabond was applied. Attention was then turned the vagina and the hulka clamp was removed.  The vagina was inspected.  No abnormal findings noted.  Small amount of bleeding from the tenaculum site was noted.  The Foley catheter was removed.  Sponge, lap, needle, instrument counts were correct x2. Patient tolerated the procedure very well. She was awakened from anesthesia, extubated and taken to recovery in stable condition.    COUNTS:  YES  PLAN OF CARE: Transfer to PACU

## 2023-02-14 NOTE — Transfer of Care (Signed)
Immediate Anesthesia Transfer of Care Note  Patient: [name] Curry  Procedure(s) Performed: LAPAROSCOPIC BILATERAL SALPINGECTOMY (Bilateral: Pelvis)  Patient Location: PACU  Anesthesia Type:General  Level of Consciousness: awake, alert , and oriented  Airway & Oxygen Therapy: Patient Spontanous Breathing and Patient connected to nasal cannula oxygen  Post-op Assessment: Report given to RN and Post -op Vital signs reviewed and stable  Post vital signs: Reviewed and stable  Last Vitals:  Vitals Value Taken Time  BP 165/80 02/14/23 1423  Temp    Pulse 90 02/14/23 1430  Resp 22 02/14/23 1430  SpO2 87 % 02/14/23 1430  Vitals shown include unfiled device data.  Last Pain:  Vitals:   02/14/23 1113  TempSrc: Oral      Patients Stated Pain Goal: 8 (02/14/23 1126)  Complications: No notable events documented.

## 2023-02-14 NOTE — Discharge Instructions (Addendum)
Post-surgical Instructions, Outpatient Surgery  You may expect to feel dizzy, weak, and drowsy for as long as 24 hours after receiving the medicine that made you sleep (anesthetic). For the first 24 hours after your surgery:   Do not drive a car, ride a bicycle, participate in physical activities, or take public transportation until you are done taking narcotic pain medicines or as directed by Dr. Hyacinth Meeker.  Do not drink alcohol or take tranquilizers.  Do not take medicine that has not been prescribed by your physicians.  Do not sign important papers or make important decisions while on narcotic pain medicines.  Have a responsible person with you.   CARE OF INCISION If you have a bandage, you may remove it in one day.  If there are steri-strips or dermabond, just let this loosen on its own.  You may shower on the first day after your surgery.  Do not sit in a tub bath for one week. Avoid heavy lifting (more than 10 pounds/4.5 kilograms), pushing, or pulling.  Avoid activities that may risk injury to your incisions.   PAIN MANAGEMENT Motrin 800mg .  (This is the same as 4-200mg  over the counter tablets of Motrin or ibuprofen.)  You may take this every eight hours or as needed for cramping.   Vicodin 5/325mg .  For more severe pain, take one or two tablets every four to six hours as needed for pain control.  (Remember that narcotic pain medications increase your risk of constipation.  If this becomes a problem, you may take an over the counter stool softener like Colace 100mg  up to four times a day.)  DO'S AND DON'T'S Do not take a tub bath for one week.  You may shower on the first day after your surgery Do not do any heavy lifting for one to two weeks.  This increases the chance of bleeding. Do move around as you feel able.  Stairs are fine.  You may begin to exercise again as you feel able.  Do not lift any weights for two weeks. Do not put anything in the vagina for two weeks--no tampons,  intercourse, or douching.    REGULAR MEDIATIONS/VITAMINS: You may restart all of your regular medications as prescribed. You may restart all of your vitamins as you normally take them.    PLEASE CALL OR SEEK MEDICAL CARE IF: You have persistent nausea and vomiting.  You have trouble eating or drinking.  You have an oral temperature above 100.5.  You have constipation that is not helped by adjusting diet or increasing fluid intake. Pain medicines are a common cause of constipation.  You have heavy vaginal bleeding You have redness or drainage from your incision(s) or there is increasing pain or tenderness near or in the surgical site.         No acetaminophen/Tylenol until after 6:00 pm today if needed.  No ibuprofen, Advil, Aleve, Motrin, ketorolac, meloxicam, naproxen, or other NSAIDS until after 8:00 pm today if needed.   Post Anesthesia Home Care Instructions  Activity: Get plenty of rest for the remainder of the day. A responsible individual must stay with you for 24 hours following the procedure.  For the next 24 hours, DO NOT: -Drive a car -Advertising copywriter -Drink alcoholic beverages -Take any medication unless instructed by your physician -Make any legal decisions or sign important papers.  Meals: Start with liquid foods such as gelatin or soup. Progress to regular foods as tolerated. Avoid greasy, spicy, heavy foods. If nausea and/or  vomiting occur, drink only clear liquids until the nausea and/or vomiting subsides. Call your physician if vomiting continues.  Special Instructions/Symptoms: Your throat may feel dry or sore from the anesthesia or the breathing tube placed in your throat during surgery. If this causes discomfort, gargle with warm salt water. The discomfort should disappear within 24 hours.
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
## 2023-02-14 NOTE — Anesthesia Postprocedure Evaluation (Signed)
Anesthesia Post Note  Patient: [name] Curry  Procedure(s) Performed: LAPAROSCOPIC BILATERAL SALPINGECTOMY (Bilateral: Pelvis)     Patient location during evaluation: PACU Anesthesia Type: General Level of consciousness: awake Pain management: pain level controlled Vital Signs Assessment: post-procedure vital signs reviewed and stable Respiratory status: spontaneous breathing, nonlabored ventilation and respiratory function stable Cardiovascular status: blood pressure returned to baseline and stable Postop Assessment: no apparent nausea or vomiting Anesthetic complications: no   No notable events documented.  Last Vitals:  Vitals:   02/14/23 1500 02/14/23 1529  BP: (!) 148/90 (!) 143/89  Pulse: 85 85  Resp: 15 14  Temp:  (!) 36.1 C  SpO2: 94% 94%    Last Pain:  Vitals:   02/14/23 1529  TempSrc:   PainSc: 3                  Linton Rump

## 2023-02-14 NOTE — Anesthesia Procedure Notes (Signed)
Procedure Name: Intubation Date/Time: 02/14/2023 12:23 PM  Performed by: Dairl Ponder, CRNAPre-anesthesia Checklist: Patient identified, Emergency Drugs available, Suction available and Patient being monitored Patient Re-evaluated:Patient Re-evaluated prior to induction Oxygen Delivery Method: Circle System Utilized Preoxygenation: Pre-oxygenation with 100% oxygen Induction Type: IV induction Ventilation: Mask ventilation without difficulty Laryngoscope Size: Glidescope and 3 (for grade 3 view with DL) Grade View: Grade I Tube type: Oral Tube size: 7.0 mm Number of attempts: 1 Airway Equipment and Method: Stylet and Oral airway Placement Confirmation: ETT inserted through vocal cords under direct vision, positive ETCO2 and breath sounds checked- equal and bilateral Secured at: 21 cm Tube secured with: Tape Dental Injury: Teeth and Oropharynx as per pre-operative assessment

## 2023-02-14 NOTE — Anesthesia Preprocedure Evaluation (Addendum)
Anesthesia Evaluation  Patient identified by MRN, date of birth, ID band Patient awake    Reviewed: Allergy & Precautions, NPO status , Patient's Chart, lab work & pertinent test results  History of Anesthesia Complications Negative for: history of anesthetic complications  Airway Mallampati: II  TM Distance: >3 FB Neck ROM: Full   Comment: Previous grade II view with Miller 2, easy mask Dental  (+) Dental Advisory Given   Pulmonary neg shortness of breath, neg sleep apnea, neg COPD, neg recent URI, former smoker   Pulmonary exam normal breath sounds clear to auscultation       Cardiovascular hypertension, (-) angina (-) Past MI, (-) Cardiac Stents, (-) CABG and (-) Orthopnea + dysrhythmias (NSVT)  Rhythm:Regular Rate:Normal  HLD  TTE 06/06/2021: IMPRESSIONS     1. Left ventricular ejection fraction, by estimation, is 60 to 65%. The  left ventricle has normal function. The left ventricle has no regional  wall motion abnormalities. Left ventricular diastolic parameters were  normal. The average left ventricular  global longitudinal strain is -19.3 %. The global longitudinal strain is  normal.   2. Right ventricular systolic function is normal. The right ventricular  size is normal.   3. The mitral valve is grossly normal. Trivial mitral valve  regurgitation. No evidence of mitral stenosis.   4. The aortic valve is tricuspid. Aortic valve regurgitation is not  visualized. No aortic stenosis is present.   Conclusion(s)/Recommendation(s): Normal biventricular function without  evidence of hemodynamically significant valvular heart disease.     Neuro/Psych  PSYCHIATRIC DISORDERS (h/o suicide attempt in 2013) Anxiety Depression    negative neurological ROS     GI/Hepatic negative GI ROS, Neg liver ROS,,,  Endo/Other  diabetesHypothyroidism    Renal/GU negative Renal ROS     Musculoskeletal  (+) Arthritis ,     Abdominal  (+) + obese  Peds  Hematology  (+) Blood dyscrasia (beta thalassemia trait), anemia Lab Results      Component                Value               Date                      WBC                      14.2 (H)            09/27/2022                HGB                      9.0 (L)             09/27/2022                HCT                      26.5 (L)            09/27/2022                MCV                      77.7 (L)            09/27/2022                PLT  179                 09/27/2022              Anesthesia Other Findings   Reproductive/Obstetrics                             Anesthesia Physical Anesthesia Plan  ASA: 2  Anesthesia Plan: General   Post-op Pain Management: Tylenol PO (pre-op)*   Induction: Intravenous  PONV Risk Score and Plan: 3 and Ondansetron, Dexamethasone and Treatment may vary due to age or medical condition  Airway Management Planned: Oral ETT  Additional Equipment:   Intra-op Plan:   Post-operative Plan: Extubation in OR  Informed Consent: I have reviewed the patients History and Physical, chart, labs and discussed the procedure including the risks, benefits and alternatives for the proposed anesthesia with the patient or authorized representative who has indicated his/her understanding and acceptance.     Dental advisory given  Plan Discussed with: CRNA and Anesthesiologist  Anesthesia Plan Comments: (Risks of general anesthesia discussed including, but not limited to, sore throat, hoarse voice, chipped/damaged teeth, injury to vocal cords, nausea and vomiting, allergic reactions, lung infection, heart attack, stroke, and death. All questions answered. )        Anesthesia Quick Evaluation

## 2023-02-15 ENCOUNTER — Encounter (HOSPITAL_BASED_OUTPATIENT_CLINIC_OR_DEPARTMENT_OTHER): Payer: Self-pay | Admitting: Obstetrics & Gynecology

## 2023-02-15 LAB — SURGICAL PATHOLOGY

## 2023-02-16 ENCOUNTER — Encounter (HOSPITAL_BASED_OUTPATIENT_CLINIC_OR_DEPARTMENT_OTHER): Payer: Self-pay | Admitting: Obstetrics & Gynecology

## 2023-02-19 ENCOUNTER — Ambulatory Visit: Payer: BLUE CROSS/BLUE SHIELD | Admitting: Nurse Practitioner

## 2023-02-20 ENCOUNTER — Ambulatory Visit: Payer: Medicaid Other | Attending: Registered Nurse | Admitting: Physical Therapy

## 2023-02-20 ENCOUNTER — Encounter: Payer: Self-pay | Admitting: Physical Therapy

## 2023-02-20 DIAGNOSIS — M25542 Pain in joints of left hand: Secondary | ICD-10-CM | POA: Diagnosis not present

## 2023-02-20 DIAGNOSIS — M6281 Muscle weakness (generalized): Secondary | ICD-10-CM | POA: Diagnosis not present

## 2023-02-20 DIAGNOSIS — M25552 Pain in left hip: Secondary | ICD-10-CM | POA: Insufficient documentation

## 2023-02-20 DIAGNOSIS — R102 Pelvic and perineal pain: Secondary | ICD-10-CM | POA: Insufficient documentation

## 2023-02-20 DIAGNOSIS — O26892 Other specified pregnancy related conditions, second trimester: Secondary | ICD-10-CM | POA: Diagnosis not present

## 2023-02-20 DIAGNOSIS — Z419 Encounter for procedure for purposes other than remedying health state, unspecified: Secondary | ICD-10-CM | POA: Diagnosis not present

## 2023-02-20 DIAGNOSIS — M25551 Pain in right hip: Secondary | ICD-10-CM | POA: Diagnosis not present

## 2023-02-20 DIAGNOSIS — M5459 Other low back pain: Secondary | ICD-10-CM | POA: Diagnosis not present

## 2023-02-20 DIAGNOSIS — M62838 Other muscle spasm: Secondary | ICD-10-CM | POA: Insufficient documentation

## 2023-02-20 NOTE — Therapy (Addendum)
OUTPATIENT PHYSICAL THERAPY THORACOLUMBAR TREATMENT   Patient Name: [name] Curry MRN: [id number] DOB:[birth year], 42 y.o., female Today's Date: 02/20/2023  END OF SESSION:  PT End of Session - 02/20/23 1057     Visit Number 2    Authorization Type [place] MEDICAID [facility]    Authorization Time Period 02/08/2023-04/09/2023    Authorization - Visit Number 1    Authorization - Number of Visits 12    PT Start Time 1019    PT Stop Time 1058    PT Time Calculation (min) 39 min    Activity Tolerance Patient tolerated treatment well    Behavior During Therapy [facility] for tasks assessed/performed              Past Medical History:  Diagnosis Date   Anemia    Anxiety    Arthritis    Beta thalassemia trait    Depression    Gestational diabetes    Graves disease    Hidradenitis    Hypothyroidism 06/30/2016   Palpitations    Pregnancy induced hypertension    Suicidal behavior 08/05/2011   Broke a light bulb and tried to cut her wrists    Past Surgical History:  Procedure Laterality Date   COLPOSCOPY     EXCISIONAL HEMORRHOIDECTOMY  1996   HYDRADENITIS EXCISION Left 12/13/2021   Procedure: EXCISION HIDRADENITIS LEFT AXILLA;  Surgeon: Abigail Miyamoto, MD;  Location: [facility];  Service: General;  Laterality: Left;   LAPAROSCOPIC BILATERAL SALPINGECTOMY Bilateral 02/14/2023   Procedure: LAPAROSCOPIC BILATERAL SALPINGECTOMY;  Surgeon: Jerene Bears, MD;  Location[facility];  Service: Gynecology;  Laterality: Bilateral;   THYROIDECTOMY N/A 06/30/2016   Procedure: TOTAL THYROIDECTOMY;  Surgeon: Darnell Level, MD;  Location: [facility];  Service: General;  Laterality: N/A;   Patient Active Problem List   Diagnosis Date Noted   Request for sterilization 02/14/2023   BMI 40.0-44.9, adult (HCC) 02/14/2023   Vaginal delivery 09/26/2022   Cervical laceration 09/26/2022   Group beta Strep positive 09/17/2022   History of gestational hypertension 08/11/2022    Penicillin allergy 07/27/2022   Bilateral carpal tunnel syndrome 06/19/2022   Supervision of high risk pregnancy, antepartum 03/08/2022   Muscle spasms of neck 02/06/2022   Beta thalassemia trait 10/07/2021   NSVT (nonsustained ventricular tachycardia) (HCC) 10/07/2021   Pre-diabetes 07/22/2021   Vitamin D deficiency 07/22/2021   Mixed hyperlipidemia 07/22/2021   BMI 36.0-36.9,adult 07/14/2021   Family history of breast cancer in sister 10/22/2019   H/O Graves' disease 09/05/2016   History of thyroidectomy 12/01/2013    PCP: Cyril Mourning, FNP  REFERRING PROVIDER: Cyril Mourning, FNP  REFERRING DIAG: [phone] (ICD-10-CM) - Pain in left hip  M54.50,G89.29 (ICD-10-CM) - Chronic bilateral low back pain without sciatica  M25.551,M25.552,G89.29 (ICD-10-CM) - Chronic pain of both hips  Rationale for Evaluation and Treatment: Rehabilitation  THERAPY DIAG:  Muscle weakness (generalized)  Pain in joint of left hand  Bilateral hip pain  Other muscle spasm  Other low back pain  Pelvic pain affecting pregnancy in second trimester, antepartum  ONSET DATE: 3 years ago  SUBJECTIVE:  SUBJECTIVE STATEMENT: Patient reports she is doing well today. She is not currently having any pain. She had surgery on 02/14/2023 (LAPAROSCOPIC BILATERAL SALPINGECTOMY) and she has been on heavy pain medications since then. She has not been able to do her HEP.  Messaged Dr. Hyacinth Meeker On 02/20/2023: no heavy lifting >15 pounds for 1 more week (until 02/27/2023) and no abdominal muscle contraction or manual massage to abdomen until 02/27/2023.    PERTINENT HISTORY:  Depression, anxiety, OA, Suicidal Behavior 08/05/2011  Messaged Dr. Hyacinth Meeker On 02/20/2023: no heavy lifting >15 pounds for 1 more week (until 02/27/2023) and no  abdominal muscle contraction or manual massage to abdomen until 02/27/2023.   PAIN:  Are you having pain? Yes: NPRS scale: 4 currently (15/10 worst for hips) /10 Pain location: bilateral lateral/ anterior hips; bilateral lower back Pain description: achy; sharp at times; at times hip gives away when bending Aggravating factors: Laying on hips; walking up the stairs; sitting more than 20 - 30 minutes; carrying daughter Relieving factors: Heat helps the leg pain but not the hips and back  PRECAUTIONS: None  RED FLAGS: None   WEIGHT BEARING RESTRICTIONS: No  FALLS:  Has patient fallen in last 6 months? 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 Minimal degenerative arthritis of both hips. Mild sclerotic arthritis of both sacroiliac joints.  PATIENT SURVEYS:  Modified Oswestry 30/50; 60% perceived disability   SCREENING FOR RED FLAGS: Bowel or bladder incontinence: Yes: bladder incontinence due to being postpartum  Spinal tumors: No Cauda equina syndrome: No Compression fracture: No Abdominal aneurysm: No  COGNITION: Overall cognitive status: Within functional limits for tasks assessed       POSTURE: rounded shoulders  PALPATION: Increased tenderness and taut muscle bands of Lt piriformis and TFL. Right hip musculature not assessed due to patient's pain while laying on Lt side. Increased tenderness and taut muscle bands of bilateral  erector spinae muscles. Decreased mobility and pain L3-L4  LUMBAR ROM:   AROM eval  Flexion To ankles  Extension 30%  Right lateral flexion To joint line  Left lateral flexion To joint line  Right rotation WFL  Left rotation WFL   (Blank rows = not tested)   LOWER EXTREMITY MMT:    MMT Right eval Left eval  Hip flexion 4 4-  Hip extension    Hip abduction 4- 4-  Hip adduction 4 4  Hip internal rotation    Hip external rotation    Knee flexion 4+ 4+  Knee extension 4+ 4+  Ankle dorsiflexion    Ankle plantarflexion    Ankle inversion    Ankle eversion     (Blank rows = not tested)    GAIT:Antalgic gait, decreased step length  TODAY'S TREATMENT:  DATE:   02/20/2023 Recumbent  5 min- PT present to discuss progress Reviewed HEP TFL stretch in side lying 2x30 sec on Lt  Bent Knee Fall Outs + TA contraction x8 each leg Supine Hip Flexion with TA contraction x8 each leg Supine Hip flexor stretch off mat table  2x30 each leg Banded Side Steps Blue TB x4 3 way SB Roll Outs x8 each way Seated hamstring stretch 2x30sec each LE  Seated adduction ball squeeze 2x10 6 in step ups + knee drive with unilateral UE support 2x10  02/08/23         Established HEP see below    If treatment provided at initial evaluation, no treatment charged due to lack of authorization.      PATIENT EDUCATION:  Education details: [email or web address] Person educated: Patient Education method: Programmer, multimedia, Demonstration, Verbal cues, and Handouts Education comprehension: verbalized understanding, returned demonstration, and needs further education  HOME EXERCISE PROGRAM: Access Code: [facility] URL: [email or web address] Date: 02/08/2023 Prepared by: Claude Manges  Exercises - Seated Piriformis Stretch  - 1 x daily - 7 x weekly - 1 sets - 8 reps - 30 hold -  Standing ITB Stretch  - 1 x daily - 7 x weekly - 1 sets - 8 reps - 30 hold - Seated Transversus Abdominis Bracing  - 1 x daily - 7 x weekly - 3 sets - 8 reps - 30 hold  ASSESSMENT:  CLINICAL IMPRESSION: Today's treatment session focused on hip mobility and strengthening. Patient had a major surgery on 02/14/2023 and she has not been able to do her home exercises regularly. She has not has much pain since she has been on pain medication. Reviewed patient's HEP and provided verbal and tactile cues for stretches. Progressed patient's TA activation exercises. Patient tolerated treatment session well. She experienced a mild increase in hip pain with supine TA exercises and banded hip abduction. Patient will benefit from skilled PT to address the below impairments and improve overall function.   OBJECTIVE IMPAIRMENTS: Abnormal gait, decreased mobility, decreased ROM, decreased strength, hypomobility, increased muscle spasms, impaired flexibility, improper body mechanics, postural dysfunction, and pain.   ACTIVITY LIMITATIONS: carrying, lifting, bending, sitting, squatting, sleeping, stairs, bed mobility, and continence  PARTICIPATION LIMITATIONS: laundry, driving, shopping, and community activity  PERSONAL FACTORS: 3+ comorbidities: OA, depression, anxiety  are also affecting patient's functional outcome.   REHAB POTENTIAL: Good  CLINICAL DECISION MAKING: Stable/uncomplicated  EVALUATION COMPLEXITY: Low   GOALS: Goals reviewed with patient? Yes  SHORT TERM GOALS: Target date: 03/08/2023  Patient will demonstrate independence in initial HEP. Baseline: Goal status: INITIAL  2.  Patient will verbalize 25% improvement in functional ability. Baseline:  Goal status: INITIAL  3.  Patient will demonstrate correct body mechanics while completing lifting and carrying tasks. Baseline:  Goal status: INITIAL   LONG TERM GOALS: Target date: 04/05/2023  Patient will demonstrate independence in  advanced HEP. Baseline:  Goal status: INITIAL  2.  Patient will verbalize 50% improvement in functional ability. Baseline:  Goal status: INITIAL  3.  Patient will improve ODI score to > or = to 20/50. Baseline: 30/50 Goal status: INITIAL  4. Patient will be able to carry her daughter with < or = to 3/10 back pain. Baseline:  Goal status: INITIAL    PLAN:  PT FREQUENCY: 2x/week  PT DURATION: 8 weeks  PLANNED INTERVENTIONS: Therapeutic exercises, Therapeutic activity, Neuromuscular re-education, Balance training, Gait training, Patient/Family education, Self Care, Joint mobilization, Joint manipulation, Stair training, Vestibular training, Canalith repositioning,  Aquatic Therapy, Dry Needling, Electrical stimulation, Spinal manipulation, Spinal mobilization, Cryotherapy, Moist heat, scar mobilization, Taping, Vasopneumatic device, Traction, Ultrasound, Biofeedback, Ionotophoresis 4mg /ml Dexamethasone, and Manual therapy.  PLAN FOR NEXT SESSION: Continue core strengthening; hip strengthening as tolerated; DN (to be performed by another therapist)   Claude Manges, PT 02/20/23 11:00 AM [facility] [address] [place], [place] [address] Phone # [phone] Fax [phone]

## 2023-02-22 ENCOUNTER — Ambulatory Visit: Payer: Medicaid Other | Admitting: Rehabilitative and Restorative Service Providers"

## 2023-02-22 ENCOUNTER — Encounter: Payer: Self-pay | Admitting: Rehabilitative and Restorative Service Providers"

## 2023-02-22 DIAGNOSIS — M6281 Muscle weakness (generalized): Secondary | ICD-10-CM

## 2023-02-22 DIAGNOSIS — O26892 Other specified pregnancy related conditions, second trimester: Secondary | ICD-10-CM | POA: Diagnosis not present

## 2023-02-22 DIAGNOSIS — M25542 Pain in joints of left hand: Secondary | ICD-10-CM | POA: Diagnosis not present

## 2023-02-22 DIAGNOSIS — M25551 Pain in right hip: Secondary | ICD-10-CM | POA: Diagnosis not present

## 2023-02-22 DIAGNOSIS — M62838 Other muscle spasm: Secondary | ICD-10-CM | POA: Diagnosis not present

## 2023-02-22 DIAGNOSIS — M25552 Pain in left hip: Secondary | ICD-10-CM | POA: Diagnosis not present

## 2023-02-22 DIAGNOSIS — M5459 Other low back pain: Secondary | ICD-10-CM

## 2023-02-22 DIAGNOSIS — R102 Pelvic and perineal pain: Secondary | ICD-10-CM | POA: Diagnosis not present

## 2023-02-22 NOTE — Patient Instructions (Signed)

## 2023-02-22 NOTE — Therapy (Signed)
OUTPATIENT PHYSICAL THERAPY TREATMENT NOTE   Patient Name: [name] Curry MRN: [id number] DOB:[birth year], 42 y.o., female Today's Date: 02/22/2023  END OF SESSION:  PT End of Session - 02/22/23 1016     Visit Number 3    Authorization Type [place] MEDICAID [facility]    Authorization Time Period 02/08/2023-04/09/2023    Authorization - Visit Number 2    Authorization - Number of Visits 12    PT Start Time 1015    PT Stop Time 1055    PT Time Calculation (min) 40 min    Activity Tolerance Patient tolerated treatment well    Behavior During Therapy [facility] for tasks assessed/performed              Past Medical History:  Diagnosis Date   Anemia    Anxiety    Arthritis    Beta thalassemia trait    Depression    Gestational diabetes    Graves disease    Hidradenitis    Hypothyroidism 06/30/2016   Palpitations    Pregnancy induced hypertension    Suicidal behavior 08/05/2011   Broke a light bulb and tried to cut her wrists    Past Surgical History:  Procedure Laterality Date   COLPOSCOPY     EXCISIONAL HEMORRHOIDECTOMY  1996   HYDRADENITIS EXCISION Left 12/13/2021   Procedure: EXCISION HIDRADENITIS LEFT AXILLA;  Surgeon: Abigail Miyamoto, MD;  Location: [facility];  Service: General;  Laterality: Left;   LAPAROSCOPIC BILATERAL SALPINGECTOMY Bilateral 02/14/2023   Procedure: LAPAROSCOPIC BILATERAL SALPINGECTOMY;  Surgeon: Jerene Bears, MD;  Location: [facility];  Service: Gynecology;  Laterality: Bilateral;   THYROIDECTOMY N/A 06/30/2016   Procedure: TOTAL THYROIDECTOMY;  Surgeon: Darnell Level, MD;  Location: [facility] OR;  Service: General;  Laterality: N/A;   Patient Active Problem List   Diagnosis Date Noted   Request for sterilization 02/14/2023   BMI 40.0-44.9, adult (HCC) 02/14/2023   Vaginal delivery 09/26/2022   Cervical laceration 09/26/2022   Group beta Strep positive 09/17/2022   History of gestational hypertension 08/11/2022    Penicillin allergy 07/27/2022   Bilateral carpal tunnel syndrome 06/19/2022   Supervision of high risk pregnancy, antepartum 03/08/2022   Muscle spasms of neck 02/06/2022   Beta thalassemia trait 10/07/2021   NSVT (nonsustained ventricular tachycardia) (HCC) 10/07/2021   Pre-diabetes 07/22/2021   Vitamin D deficiency 07/22/2021   Mixed hyperlipidemia 07/22/2021   BMI 36.0-36.9,adult 07/14/2021   Family history of breast cancer in sister 10/22/2019   H/O Graves' disease 09/05/2016   History of thyroidectomy 12/01/2013    PCP: Cyril Mourning, FNP  REFERRING PROVIDER: Cyril Mourning, FNP  REFERRING DIAG: [phone] (ICD-10-CM) - Pain in left hip  M54.50,G89.29 (ICD-10-CM) - Chronic bilateral low back pain without sciatica  M25.551,M25.552,G89.29 (ICD-10-CM) - Chronic pain of both hips  Rationale for Evaluation and Treatment: Rehabilitation  THERAPY DIAG:  Muscle weakness (generalized)  Bilateral hip pain  Other muscle spasm  Other low back pain  ONSET DATE: 3 years ago  SUBJECTIVE:  SUBJECTIVE STATEMENT: Patient reports that she usually has minimal pain in the morning, but has increased pain as the day progresses.  Pain currently a 1/10.  Pt reports that she is ready to try dry needling today.  PERTINENT HISTORY:  S/P laparascopic bilateral salpingectomy on 02/14/2023, Depression, anxiety, OA, Suicidal Behavior 08/05/2011  Messaged Dr. Hyacinth Meeker On 02/20/2023: no heavy lifting >15 pounds for 1 more week (until 02/27/2023) and no abdominal muscle contraction or manual massage to abdomen until 02/27/2023.    PAIN:  Are you having pain? Yes: NPRS scale: 1-7/10 Pain location: bilateral lateral/ anterior hips; bilateral lower back Pain description: achy; sharp at times; at times hip gives away  when bending Aggravating factors: Laying on hips; walking up the stairs; sitting more than 20 - 30 minutes; carrying daughter Relieving factors: Heat helps the leg pain but not the hips and back  PRECAUTIONS: Other: no heavy lifting >15 pounds for 1 more week (until 02/27/2023) and no abdominal muscle contraction or manual massage to abdomen until 02/27/2023.     WEIGHT BEARING RESTRICTIONS: No  FALLS:  Has patient fallen in last 6 months? 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 Minimal degenerative arthritis of both hips. Mild sclerotic arthritis of both sacroiliac joints.  PATIENT SURVEYS:  Eval:  Modified Oswestry 30/50; 60% perceived disability   SCREENING FOR RED FLAGS: Bowel or bladder incontinence: Yes: bladder incontinence due to being postpartum  Spinal tumors: No Cauda equina syndrome: No Compression fracture: No Abdominal aneurysm: No  COGNITION: Overall cognitive status: Within functional limits for tasks assessed       POSTURE: rounded shoulders  PALPATION: Increased tenderness and taut muscle bands of Lt piriformis and TFL. Right hip musculature not assessed due to patient's pain while laying on Lt side. Increased tenderness and taut muscle bands of bilateral erector spinae muscles. Decreased mobility and pain L3-L4  LUMBAR ROM:   AROM eval  Flexion To ankles  Extension 30%  Right lateral flexion To joint line   Left lateral flexion To joint line  Right rotation WFL  Left rotation WFL   (Blank rows = not tested)   LOWER EXTREMITY MMT:    MMT Right eval Left eval  Hip flexion 4 4-  Hip extension    Hip abduction 4- 4-  Hip adduction 4 4  Hip internal rotation    Hip external rotation    Knee flexion 4+ 4+  Knee extension 4+ 4+  Ankle dorsiflexion    Ankle plantarflexion    Ankle inversion    Ankle eversion     (Blank rows = not tested)    GAIT:Antalgic gait, decreased step length  TODAY'S TREATMENT:  DATE:  02/22/2023 Recumbent bike level 2 x7 min with PT present to discuss status Seated hamstring stretch 2x20 sec bilat Seated piriformis stretch 2x20 sec bilat Sit to/from stand 2x10 Standing at barre with blue loop:  hip abduction and hip extension.  2x10 each bilat Trigger Point Dry-Needling  Treatment instructions: Expect mild to moderate muscle soreness. S/S of pneumothorax if dry needled over a lung field, and to seek immediate medical attention should they occur. Patient verbalized understanding of these instructions and education. Patient Consent Given: Yes Education handout provided: Yes Muscles treated: left lumbar multifidi, bilateral piriformis Electrical stimulation performed: No Parameters: N/A Treatment response/outcome: Utilized skilled palpation to identify bony landmarks and trigger points.  Able to illicit twitch response and muscle elongation.  Soft tissue mobilization following to further promote tissue elongation.     02/20/2023 Recumbent  5 min- PT present to discuss progress Reviewed HEP TFL stretch in side lying 2x30 sec on Lt  Bent Knee Fall Outs + TA contraction x8 each leg Supine Hip Flexion with TA contraction x8 each leg Supine Hip flexor stretch off mat table  2x30 each leg Banded Side Steps Blue TB x4 3 way SB Roll  Outs x8 each way Seated hamstring stretch 2x30sec each LE  Seated adduction ball squeeze 2x10 6 in step ups + knee drive with unilateral UE support 2x10    PATIENT EDUCATION:  Education details: [email or web address] Person educated: Patient Education method: Programmer, multimedia, Demonstration, Verbal cues, and Handouts Education comprehension: verbalized understanding, returned demonstration, and needs further education  HOME EXERCISE PROGRAM: Access Code: [facility] URL: [email or web address] Date: 02/08/2023 Prepared by: Claude Manges  Exercises - Seated Piriformis Stretch  - 1 x daily - 7 x weekly - 1 sets - 8 reps - 30 hold - Standing ITB Stretch  - 1 x daily - 7 x weekly - 1 sets - 8 reps - 30 hold - Seated Transversus Abdominis Bracing  - 1 x daily - 7 x weekly - 3 sets - 8 reps - 30 hold  ASSESSMENT:  CLINICAL IMPRESSION: Ms Sehorn presents to skilled PT reporting that she is ready to try dry needling.  Patient educated on precautions/restrictions recommended by Dr Hyacinth Meeker and she verbalizes her understanding.  Patient with good participation throughout session and only requires minimal cuing for improved posture/technique.  Patient with good response to dry needling reporting near immediate relief of pain and tightness.  Patient reports following soft tissue mobility she notes significant improvement with bed mobility following manual therapy.  Patient continues to require skilled PT to progress towards goal related activities.   OBJECTIVE IMPAIRMENTS: Abnormal gait, decreased mobility, decreased ROM, decreased strength, hypomobility, increased muscle spasms, impaired flexibility, improper body mechanics, postural dysfunction, and pain.   ACTIVITY LIMITATIONS: carrying, lifting, bending, sitting, squatting, sleeping, stairs, bed mobility, and continence  PARTICIPATION LIMITATIONS: laundry, driving, shopping, and community activity  PERSONAL FACTORS: 3+ comorbidities: OA, depression,  anxiety  are also affecting patient's functional outcome.   REHAB POTENTIAL: Good  CLINICAL DECISION MAKING: Stable/uncomplicated  EVALUATION COMPLEXITY: Low   GOALS: Goals reviewed with patient? Yes  SHORT TERM GOALS: Target date: 03/08/2023  Patient will demonstrate independence in initial HEP. Baseline: Goal status: MET  2.  Patient will verbalize 25% improvement in functional ability. Baseline:  Goal status: Ongoing  3.  Patient will demonstrate correct body mechanics while completing lifting and carrying tasks. Baseline:  Goal status: Ongoing   LONG TERM GOALS: Target date: 04/05/2023  Patient will demonstrate independence in advanced HEP.  Baseline:  Goal status: INITIAL  2.  Patient will verbalize 50% improvement in functional ability. Baseline:  Goal status: INITIAL  3.  Patient will improve ODI score to > or = to 20/50. Baseline: 30/50 Goal status: INITIAL  4. Patient will be able to carry her daughter with < or = to 3/10 back pain. Baseline:  Goal status: INITIAL    PLAN:  PT FREQUENCY: 2x/week  PT DURATION: 8 weeks  PLANNED INTERVENTIONS: Therapeutic exercises, Therapeutic activity, Neuromuscular re-education, Balance training, Gait training, Patient/Family education, Self Care, Joint mobilization, Joint manipulation, Stair training, Vestibular training, Canalith repositioning, Aquatic Therapy, Dry Needling, Electrical stimulation, Spinal manipulation, Spinal mobilization, Cryotherapy, Moist heat, scar mobilization, Taping, Vasopneumatic device, Traction, Ultrasound, Biofeedback, Ionotophoresis 4mg /ml Dexamethasone, and Manual therapy.  PLAN FOR NEXT SESSION: Continue core strengthening; hip strengthening as tolerated; manual therapy/dry needling as indicated   Reather Laurence, PT, DPT 02/22/23, 11:30 AM  [facility] [address], [address] [place], [place] [address] Phone # [phone] Fax [phone]

## 2023-02-27 ENCOUNTER — Ambulatory Visit: Payer: Medicaid Other | Admitting: Physical Therapy

## 2023-02-27 ENCOUNTER — Encounter: Payer: Self-pay | Admitting: Physical Therapy

## 2023-02-27 DIAGNOSIS — M25551 Pain in right hip: Secondary | ICD-10-CM

## 2023-02-27 DIAGNOSIS — O26892 Other specified pregnancy related conditions, second trimester: Secondary | ICD-10-CM | POA: Diagnosis not present

## 2023-02-27 DIAGNOSIS — M25542 Pain in joints of left hand: Secondary | ICD-10-CM | POA: Diagnosis not present

## 2023-02-27 DIAGNOSIS — M6281 Muscle weakness (generalized): Secondary | ICD-10-CM | POA: Diagnosis not present

## 2023-02-27 DIAGNOSIS — M62838 Other muscle spasm: Secondary | ICD-10-CM

## 2023-02-27 DIAGNOSIS — M25552 Pain in left hip: Secondary | ICD-10-CM | POA: Diagnosis not present

## 2023-02-27 DIAGNOSIS — M5459 Other low back pain: Secondary | ICD-10-CM | POA: Diagnosis not present

## 2023-02-27 DIAGNOSIS — R102 Pelvic and perineal pain: Secondary | ICD-10-CM | POA: Diagnosis not present

## 2023-02-27 NOTE — Therapy (Signed)
OUTPATIENT PHYSICAL THERAPY TREATMENT NOTE   Patient Name: [name] Curry MRN: [id number] DOB:March 30, 1981, 42 y.o., female Today's Date: 02/27/2023  END OF SESSION:  PT End of Session - 02/27/23 1058     Visit Number 4    Authorization Type [place] MEDICAID [facility]    Authorization Time Period 02/08/2023-04/09/2023    Authorization - Visit Number 3    Authorization - Number of Visits 12    PT Start Time 1018    PT Stop Time 1059    PT Time Calculation (min) 41 min    Activity Tolerance Patient tolerated treatment well    Behavior During Therapy [facility] for tasks assessed/performed               Past Medical History:  Diagnosis Date   Anemia    Anxiety    Arthritis    Beta thalassemia trait    Depression    Gestational diabetes    Graves disease    Hidradenitis    Hypothyroidism 06/30/2016   Palpitations    Pregnancy induced hypertension    Suicidal behavior 08/05/2011   Broke a light bulb and tried to cut her wrists    Past Surgical History:  Procedure Laterality Date   COLPOSCOPY     EXCISIONAL HEMORRHOIDECTOMY  1996   HYDRADENITIS EXCISION Left 12/13/2021   Procedure: EXCISION HIDRADENITIS LEFT AXILLA;  Surgeon: Abigail Miyamoto, MD;  Location: [facility];  Service: General;  Laterality: Left;   LAPAROSCOPIC BILATERAL SALPINGECTOMY Bilateral 02/14/2023   Procedure: LAPAROSCOPIC BILATERAL SALPINGECTOMY;  Surgeon: Jerene Bears, MD;  Location: [facility];  Service: Gynecology;  Laterality: Bilateral;   THYROIDECTOMY N/A 06/30/2016   Procedure: TOTAL THYROIDECTOMY;  Surgeon: Darnell Level, MD;  Location: [facility] OR;  Service: General;  Laterality: N/A;   Patient Active Problem List   Diagnosis Date Noted   Request for sterilization 02/14/2023   BMI 40.0-44.9, adult (HCC) 02/14/2023   Vaginal delivery 09/26/2022   Cervical laceration 09/26/2022   Group beta Strep positive 09/17/2022   History of gestational hypertension 08/11/2022    Penicillin allergy 07/27/2022   Bilateral carpal tunnel syndrome 06/19/2022   Supervision of high risk pregnancy, antepartum 03/08/2022   Muscle spasms of neck 02/06/2022   Beta thalassemia trait 10/07/2021   NSVT (nonsustained ventricular tachycardia) (HCC) 10/07/2021   Pre-diabetes 07/22/2021   Vitamin D deficiency 07/22/2021   Mixed hyperlipidemia 07/22/2021   BMI 36.0-36.9,adult 07/14/2021   Family history of breast cancer in sister 10/22/2019   H/O Graves' disease 09/05/2016   History of thyroidectomy 12/01/2013    PCP: Cyril Mourning, FNP  REFERRING PROVIDER: Cyril Mourning, FNP  REFERRING DIAG: [phone] (ICD-10-CM) - Pain in left hip  M54.50,G89.29 (ICD-10-CM) - Chronic bilateral low back pain without sciatica  M25.551,M25.552,G89.29 (ICD-10-CM) - Chronic pain of both hips  Rationale for Evaluation and Treatment: Rehabilitation  THERAPY DIAG:  Muscle weakness (generalized)  Bilateral hip pain  Other muscle spasm  Other low back pain  ONSET DATE: 3 years ago  SUBJECTIVE:  SUBJECTIVE STATEMENT: Patient reports she is doing good today. She on pain medications currently due to a tooth ache so she is not having much pain. She really like dry needling last treatment session  PERTINENT HISTORY:  S/P laparascopic bilateral salpingectomy on 02/14/2023, Depression, anxiety, OA, Suicidal Behavior 08/05/2011  Messaged Dr. Hyacinth Meeker On 02/20/2023: no heavy lifting >15 pounds for 1 more week (until 02/27/2023) and no abdominal muscle contraction or manual massage to abdomen until 02/27/2023.    PAIN: 02/27/2023 Are you having pain? Yes: NPRS scale: 2/10 Pain location: bilateral lateral/ anterior hips; bilateral lower back Pain description: achy; sharp at times; at times hip gives away when  bending Aggravating factors: Laying on hips; walking up the stairs; sitting more than 20 - 30 minutes; carrying daughter Relieving factors: Heat helps the leg pain but not the hips and back  PRECAUTIONS: Other: no heavy lifting >15 pounds for 1 more week (until 02/27/2023) and no abdominal muscle contraction or manual massage to abdomen until 02/27/2023.     WEIGHT BEARING RESTRICTIONS: No  FALLS:  Has patient fallen in last 6 months? 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 Minimal degenerative arthritis of both hips. Mild sclerotic arthritis of both sacroiliac joints.  PATIENT SURVEYS:  Eval:  Modified Oswestry 30/50; 60% perceived disability   SCREENING FOR RED FLAGS: Bowel or bladder incontinence: Yes: bladder incontinence due to being postpartum  Spinal tumors: No Cauda equina syndrome: No Compression fracture: No Abdominal aneurysm: No  COGNITION: Overall cognitive status: Within functional limits for tasks assessed       POSTURE: rounded shoulders  PALPATION: Increased tenderness and taut muscle bands of Lt piriformis and TFL. Right hip musculature not assessed due to patient's pain while laying on Lt side. Increased tenderness and taut muscle bands of bilateral erector spinae muscles. Decreased mobility and pain L3-L4  LUMBAR ROM:   AROM eval  Flexion To ankles  Extension 30%  Right lateral flexion To joint line  Left  lateral flexion To joint line  Right rotation WFL  Left rotation WFL   (Blank rows = not tested)   LOWER EXTREMITY MMT:    MMT Right eval Left eval  Hip flexion 4 4-  Hip extension    Hip abduction 4- 4-  Hip adduction 4 4  Hip internal rotation    Hip external rotation    Knee flexion 4+ 4+  Knee extension 4+ 4+  Ankle dorsiflexion    Ankle plantarflexion    Ankle inversion    Ankle eversion     (Blank rows = not tested)    GAIT:Antalgic gait, decreased step length  TODAY'S TREATMENT:  DATE:  02/27/2023 Recumbent bike level 2 x7 min with PT present to discuss status Seated hamstring stretch 2x20 sec bilat Seated piriformis stretch 2x20 sec bilat Attempted bridges but patient had some discomfort in her abdomen Clamshells 2 x 10 each side Standing hip flexor stretch on 2nd step 10 x each leg 5 sec hold Standing at barre with blue loop:  hip abduction and hip extension.  2x10 each bilat Standing March with unilateral KB hold x10 each Standing hip adductor stretch at barre x 10 each 3- 4 sec hold 6 in step ups + knee drive with unilateral UE support 2 x 10 Leg Press bilateral 50# (will increase weight next time) 2 x 10; unilateral 50# 2 x 10   DATE:  02/22/2023 Recumbent bike level 2 x7 min with PT present to discuss status Seated hamstring stretch 2x20 sec bilat Seated piriformis stretch 2x20 sec bilat Sit to/from stand 2x10 Standing at barre with blue loop:  hip abduction and hip extension.  2x10 each bilat Trigger Point Dry-Needling  Treatment instructions: Expect mild to moderate muscle soreness. S/S of pneumothorax if dry needled over a lung field, and to seek immediate medical attention should they occur. Patient verbalized understanding of these instructions and education. Patient Consent Given: Yes Education handout provided:  Yes Muscles treated: left lumbar multifidi, bilateral piriformis Electrical stimulation performed: No Parameters: N/A Treatment response/outcome: Utilized skilled palpation to identify bony landmarks and trigger points.  Able to illicit twitch response and muscle elongation.  Soft tissue mobilization following to further promote tissue elongation.    02/20/2023 Recumbent  5 min- PT present to discuss progress Reviewed HEP TFL stretch in side lying 2x30 sec on Lt  Bent Knee Fall Outs + TA contraction x8 each leg Supine Hip Flexion with TA contraction x8 each leg Supine Hip flexor stretch off mat table  2x30 each leg Banded Side Steps Blue TB x4 3 way SB Roll Outs x8 each way Seated hamstring stretch 2x30sec each LE  Seated adduction ball squeeze 2x10 6 in step ups + knee drive with unilateral UE support 2x10    PATIENT EDUCATION:  Education details: [email or web address] Person educated: Patient Education method: Programmer, multimedia, Demonstration, Verbal cues, and Handouts Education comprehension: verbalized understanding, returned demonstration, and needs further education  HOME EXERCISE PROGRAM: Access Code: [facility] URL: [email or web address] Date: 02/08/2023 Prepared by: Claude Manges  Exercises - Seated Piriformis Stretch  - 1 x daily - 7 x weekly - 1 sets - 8 reps - 30 hold - Standing ITB Stretch  - 1 x daily - 7 x weekly - 1 sets - 8 reps - 30 hold - Seated Transversus Abdominis Bracing  - 1 x daily - 7 x weekly - 3 sets - 8 reps - 30 hold  ASSESSMENT:  CLINICAL IMPRESSION: Today's treatment session focused on hip strengthening and mobility. Patient experienced some abdominal discomfort with supine bridges so discontinued exercises. Patient verbalized tightness in her hip that responded favorably to hip flexor and adductor stretches. Patient tolerated treatment session well and required minimal verbal cues for form correction. Patient will benefit from skilled PT to address the  below impairments and improve overall function.   OBJECTIVE IMPAIRMENTS: Abnormal gait, decreased mobility, decreased ROM, decreased strength, hypomobility, increased muscle spasms, impaired flexibility, improper body mechanics, postural dysfunction, and pain.   ACTIVITY LIMITATIONS: carrying, lifting, bending, sitting, squatting, sleeping, stairs, bed mobility, and continence  PARTICIPATION LIMITATIONS: laundry, driving, shopping, and community activity  PERSONAL FACTORS: 3+ comorbidities: OA, depression, anxiety  are also affecting patient's functional outcome.   REHAB POTENTIAL: Good  CLINICAL DECISION MAKING: Stable/uncomplicated  EVALUATION COMPLEXITY: Low   GOALS: Goals reviewed with patient? Yes  SHORT TERM GOALS: Target date: 03/08/2023  Patient will demonstrate independence in initial HEP. Baseline: Goal status: MET  2.  Patient will verbalize 25% improvement in functional ability. Baseline:  Goal status: Ongoing  3.  Patient will demonstrate correct body mechanics while completing lifting and carrying tasks. Baseline:  Goal status: Ongoing   LONG TERM GOALS: Target date: 04/05/2023  Patient will demonstrate independence in advanced HEP. Baseline:  Goal status: INITIAL  2.  Patient will verbalize 50% improvement in functional ability. Baseline:  Goal status: INITIAL  3.  Patient will improve ODI score to > or = to 20/50. Baseline: 30/50 Goal status: INITIAL  4. Patient will be able to carry her daughter with < or = to 3/10 back pain. Baseline:  Goal status: INITIAL    PLAN:  PT FREQUENCY: 2x/week  PT DURATION: 8 weeks  PLANNED INTERVENTIONS: Therapeutic exercises, Therapeutic activity, Neuromuscular re-education, Balance training, Gait training, Patient/Family education, Self Care, Joint mobilization, Joint manipulation, Stair training, Vestibular training, Canalith repositioning, Aquatic Therapy, Dry Needling, Electrical stimulation, Spinal  manipulation, Spinal mobilization, Cryotherapy, Moist heat, scar mobilization, Taping, Vasopneumatic device, Traction, Ultrasound, Biofeedback, Ionotophoresis 4mg /ml Dexamethasone, and Manual therapy.  PLAN FOR NEXT SESSION: Continue core & hip strengthening; DN if another PT is available    Claude Manges, PT 02/27/23 11:01 AM  [facility] [address], [address] [place], [place] [address] Phone # [phone] Fax [phone]

## 2023-03-01 ENCOUNTER — Ambulatory Visit: Payer: Medicaid Other | Admitting: Physical Therapy

## 2023-03-01 DIAGNOSIS — Z1384 Encounter for screening for dental disorders: Secondary | ICD-10-CM | POA: Diagnosis not present

## 2023-03-06 ENCOUNTER — Ambulatory Visit: Payer: Medicaid Other | Admitting: Physical Therapy

## 2023-03-06 ENCOUNTER — Encounter: Payer: Self-pay | Admitting: Physical Therapy

## 2023-03-06 DIAGNOSIS — M25551 Pain in right hip: Secondary | ICD-10-CM | POA: Diagnosis not present

## 2023-03-06 DIAGNOSIS — R102 Pelvic and perineal pain: Secondary | ICD-10-CM | POA: Diagnosis not present

## 2023-03-06 DIAGNOSIS — M5459 Other low back pain: Secondary | ICD-10-CM | POA: Diagnosis not present

## 2023-03-06 DIAGNOSIS — M6281 Muscle weakness (generalized): Secondary | ICD-10-CM | POA: Diagnosis not present

## 2023-03-06 DIAGNOSIS — M25542 Pain in joints of left hand: Secondary | ICD-10-CM | POA: Diagnosis not present

## 2023-03-06 DIAGNOSIS — O26892 Other specified pregnancy related conditions, second trimester: Secondary | ICD-10-CM | POA: Diagnosis not present

## 2023-03-06 DIAGNOSIS — M25552 Pain in left hip: Secondary | ICD-10-CM | POA: Diagnosis not present

## 2023-03-06 DIAGNOSIS — M62838 Other muscle spasm: Secondary | ICD-10-CM

## 2023-03-06 NOTE — Therapy (Addendum)
OUTPATIENT PHYSICAL THERAPY TREATMENT NOTE   Patient Name: [name] Curry MRN: [id number] DOB:1981/03/11, 42 y.o., female Today's Date: 03/06/2023  END OF SESSION:  PT End of Session - 03/06/23 1100     Visit Number 5    Authorization Type [place] MEDICAID [facility]    Authorization Time Period 02/08/2023-04/09/2023    Authorization - Visit Number 4    Authorization - Number of Visits 12    PT Start Time 1020    PT Stop Time 1100    PT Time Calculation (min) 40 min    Activity Tolerance Patient tolerated treatment well    Behavior During Therapy [facility] for tasks assessed/performed                Past Medical History:  Diagnosis Date   Anemia    Anxiety    Arthritis    Beta thalassemia trait    Depression    Gestational diabetes    Graves disease    Hidradenitis    Hypothyroidism 06/30/2016   Palpitations    Pregnancy induced hypertension    Suicidal behavior 08/05/2011   Broke a light bulb and tried to cut her wrists    Past Surgical History:  Procedure Laterality Date   COLPOSCOPY     EXCISIONAL HEMORRHOIDECTOMY  1996   HYDRADENITIS EXCISION Left 12/13/2021   Procedure: EXCISION HIDRADENITIS LEFT AXILLA;  Surgeon: Abigail Miyamoto, MD;  Location: [facility];  Service: General;  Laterality: Left;   LAPAROSCOPIC BILATERAL SALPINGECTOMY Bilateral 02/14/2023   Procedure: LAPAROSCOPIC BILATERAL SALPINGECTOMY;  Surgeon: Jerene Bears, MD;  Location: [facility];  Service: Gynecology;  Laterality: Bilateral;   THYROIDECTOMY N/A 06/30/2016   Procedure: TOTAL THYROIDECTOMY;  Surgeon: Darnell Level, MD;  Location: [facility] OR;  Service: General;  Laterality: N/A;   Patient Active Problem List   Diagnosis Date Noted   Request for sterilization 02/14/2023   BMI 40.0-44.9, adult (HCC) 02/14/2023   Vaginal delivery 09/26/2022   Cervical laceration 09/26/2022   Group beta Strep positive 09/17/2022   History of gestational hypertension 08/11/2022    Penicillin allergy 07/27/2022   Bilateral carpal tunnel syndrome 06/19/2022   Supervision of high risk pregnancy, antepartum 03/08/2022   Muscle spasms of neck 02/06/2022   Beta thalassemia trait 10/07/2021   NSVT (nonsustained ventricular tachycardia) (HCC) 10/07/2021   Pre-diabetes 07/22/2021   Vitamin D deficiency 07/22/2021   Mixed hyperlipidemia 07/22/2021   BMI 36.0-36.9,adult 07/14/2021   Family history of breast cancer in sister 10/22/2019   H/O Graves' disease 09/05/2016   History of thyroidectomy 12/01/2013    PCP: Cyril Mourning, FNP  REFERRING PROVIDER: Cyril Mourning, FNP  REFERRING DIAG: [phone] (ICD-10-CM) - Pain in left hip  M54.50,G89.29 (ICD-10-CM) - Chronic bilateral low back pain without sciatica  M25.551,M25.552,G89.29 (ICD-10-CM) - Chronic pain of both hips  Rationale for Evaluation and Treatment: Rehabilitation  THERAPY DIAG:  Muscle weakness (generalized)  Bilateral hip pain  Other muscle spasm  Other low back pain  ONSET DATE: 3 years ago  SUBJECTIVE:  SUBJECTIVE STATEMENT: Patient reports she is doing okay today. She is currently having 3/10 hip pain. She has not have a chance to do all of her hip exercises but she regularly does the piriformis stretch and stair exercises. Negotiating stairs and laying on her hips is still painful.  PERTINENT HISTORY:  S/P laparascopic bilateral salpingectomy on 02/14/2023, Depression, anxiety, OA, Suicidal Behavior 08/05/2011  Messaged Dr. Hyacinth Meeker On 02/20/2023: no heavy lifting >15 pounds for 1 more week (until 02/27/2023) and no abdominal muscle contraction or manual massage to abdomen until 02/27/2023.    PAIN: 02/27/2023 Are you having pain? Yes: NPRS scale: 2/10 Pain location: bilateral lateral/ anterior hips; bilateral  lower back Pain description: achy; sharp at times; at times hip gives away when bending Aggravating factors: Laying on hips; walking up the stairs; sitting more than 20 - 30 minutes; carrying daughter Relieving factors: Heat helps the leg pain but not the hips and back  PRECAUTIONS: Other: no heavy lifting >15 pounds for 1 more week (until 02/27/2023) and no abdominal muscle contraction or manual massage to abdomen until 02/27/2023.     WEIGHT BEARING RESTRICTIONS: No  FALLS:  Has patient fallen in last 6 months? 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 Minimal degenerative arthritis of both hips. Mild sclerotic arthritis of both sacroiliac joints.  PATIENT SURVEYS:  Eval:  Modified Oswestry 30/50; 60% perceived disability   SCREENING FOR RED FLAGS: Bowel or bladder incontinence: Yes: bladder incontinence due to being postpartum  Spinal tumors: No Cauda equina syndrome: No Compression fracture: No Abdominal aneurysm: No  COGNITION: Overall cognitive status: Within functional limits for tasks assessed       POSTURE: rounded shoulders  PALPATION: Increased tenderness and taut muscle bands of Lt piriformis and TFL. Right hip musculature not assessed due to patient's pain while laying on Lt side. Increased tenderness and taut muscle bands of bilateral erector spinae muscles. Decreased mobility and pain L3-L4  LUMBAR ROM:   AROM eval   Flexion To ankles  Extension 30%  Right lateral flexion To joint line  Left lateral flexion To joint line  Right rotation WFL  Left rotation WFL   (Blank rows = not tested)   LOWER EXTREMITY MMT:    MMT Right eval Left eval  Hip flexion 4 4-  Hip extension    Hip abduction 4- 4-  Hip adduction 4 4  Hip internal rotation    Hip external rotation    Knee flexion 4+ 4+  Knee extension 4+ 4+  Ankle dorsiflexion    Ankle plantarflexion    Ankle inversion    Ankle eversion     (Blank rows = not tested)    GAIT:Antalgic gait, decreased step length  TODAY'S TREATMENT:  DATE:  03/06/2023 Recumbent bike level 3 x6 min with PT present to discuss status Standing hip abduction with on leg on wall green loop around leg 2 x 10 each Leg Press bilateral 50# (will increase weight next time) 2 x 10; unilateral 50# 2 x 10 Standing March with unilateral 7# DB hold x10 each  Log Roll Technique  Standing hip adductor stretch at barre x 5 each 10 sec hold Side Steps with blue loop x 4 Monster Walks with blue loop x 4  Standing toe taps with blue loop (forwards, lateral , backwards) x 8 each side Standing Pallof Press light loop 2 x 10 Sit to stands 5 lb KB 2 x 10 Standing hamstring stretch on 2nd stair 30 sec each Standing hip flexor stretch on 2nd stair 30 sec each Seated piriformis stretch 30  sec bilat    02/27/2023 Recumbent bike level 2 x7 min with PT present to discuss status Seated hamstring stretch 2x20 sec bilat Seated piriformis stretch 2x20 sec bilat Attempted bridges but patient had some discomfort in her abdomen Clamshells 2 x 10 each side Standing hip flexor stretch on 2nd step 10 x each leg 5 sec hold Standing at barre with blue loop:  hip abduction and hip extension.  2x10 each bilat Standing March with unilateral KB hold x10 each Standing  hip adductor stretch at barre x 10 each 3- 4 sec hold 6 in step ups + knee drive with unilateral UE support 2 x 10 Leg Press bilateral 50# (will increase weight next time) 2 x 10; unilateral 50# 2 x 10   DATE:  02/22/2023 Recumbent bike level 2 x7 min with PT present to discuss status Seated hamstring stretch 2x20 sec bilat Seated piriformis stretch 2x20 sec bilat Sit to/from stand 2x10 Standing at barre with blue loop:  hip abduction and hip extension.  2x10 each bilat Trigger Point Dry-Needling  Treatment instructions: Expect mild to moderate muscle soreness. S/S of pneumothorax if dry needled over a lung field, and to seek immediate medical attention should they occur. Patient verbalized understanding of these instructions and education. Patient Consent Given: Yes Education handout provided: Yes Muscles treated: left lumbar multifidi, bilateral piriformis Electrical stimulation performed: No Parameters: N/A Treatment response/outcome: Utilized skilled palpation to identify bony landmarks and trigger points.  Able to illicit twitch response and muscle elongation.  Soft tissue mobilization following to further promote tissue elongation.     PATIENT EDUCATION:  Education details: [id number] Person educated: Patient Education method: Programmer, multimedia, Demonstration, Verbal cues, and Handouts Education comprehension: verbalized understanding, returned demonstration, and needs further education  HOME EXERCISE PROGRAM: Access Code: [facility] URL: [email or web address] Date: 02/08/2023 Prepared by: Claude Manges  Exercises - Seated Piriformis Stretch  - 1 x daily - 7 x weekly - 1 sets - 8 reps - 30 hold - Standing ITB Stretch  - 1 x daily - 7 x weekly - 1 sets - 8 reps - 30 hold - Seated Transversus Abdominis Bracing  - 1 x daily - 7 x weekly - 3 sets - 8 reps - 30 hold  ASSESSMENT:  CLINICAL IMPRESSION: Today's treatment session focused on hip strengthening. Patient was able  to tolerate increased weight on leg press without having increased hip pain. Incorporated standing monster walks and patient verbalized feeling a challenge. Patient required moderate verbal cues while performing exercises for proper execution. Patient tolerated treatment session well and did not experience any increased pain. Patient had tightness around hip that responded well to stretching at  end of treatment session. Patient will benefit from skilled PT to address the below impairments and improve overall function.     OBJECTIVE IMPAIRMENTS: Abnormal gait, decreased mobility, decreased ROM, decreased strength, hypomobility, increased muscle spasms, impaired flexibility, improper body mechanics, postural dysfunction, and pain.   ACTIVITY LIMITATIONS: carrying, lifting, bending, sitting, squatting, sleeping, stairs, bed mobility, and continence  PARTICIPATION LIMITATIONS: laundry, driving, shopping, and community activity  PERSONAL FACTORS: 3+ comorbidities: OA, depression, anxiety  are also affecting patient's functional outcome.   REHAB POTENTIAL: Good  CLINICAL DECISION MAKING: Stable/uncomplicated  EVALUATION COMPLEXITY: Low   GOALS: Goals reviewed with patient? Yes  SHORT TERM GOALS: Target date: 03/08/2023  Patient will demonstrate independence in initial HEP. Baseline: Goal status: MET  2.  Patient will verbalize 25% improvement in functional ability. Baseline:  Goal status: Ongoing  3.  Patient will demonstrate correct body mechanics while completing lifting and carrying tasks. Baseline:  Goal status: Ongoing   LONG TERM GOALS: Target date: 04/05/2023  Patient will demonstrate independence in advanced HEP. Baseline:  Goal status: INITIAL  2.  Patient will verbalize 50% improvement in functional ability. Baseline:  Goal status: INITIAL  3.  Patient will improve ODI score to > or = to 20/50. Baseline: 30/50 Goal status: INITIAL  4. Patient will be able to  carry her daughter with < or = to 3/10 back pain. Baseline:  Goal status: INITIAL    PLAN:  PT FREQUENCY: 2x/week  PT DURATION: 8 weeks  PLANNED INTERVENTIONS: Therapeutic exercises, Therapeutic activity, Neuromuscular re-education, Balance training, Gait training, Patient/Family education, Self Care, Joint mobilization, Joint manipulation, Stair training, Vestibular training, Canalith repositioning, Aquatic Therapy, Dry Needling, Electrical stimulation, Spinal manipulation, Spinal mobilization, Cryotherapy, Moist heat, scar mobilization, Taping, Vasopneumatic device, Traction, Ultrasound, Biofeedback, Ionotophoresis 4mg /ml Dexamethasone, and Manual therapy.  PLAN FOR NEXT SESSION: Continue core & hip strengthening as tolerated; DN next treatment session   Claude Manges, PT 03/06/23 11:01 AM  [facility] [address], [address] [place], [place] [address] Phone # [phone] Fax [phone]

## 2023-03-08 ENCOUNTER — Encounter: Payer: Self-pay | Admitting: Physical Therapy

## 2023-03-08 ENCOUNTER — Ambulatory Visit: Payer: Medicaid Other | Admitting: Physical Therapy

## 2023-03-08 DIAGNOSIS — M5459 Other low back pain: Secondary | ICD-10-CM

## 2023-03-08 DIAGNOSIS — M25542 Pain in joints of left hand: Secondary | ICD-10-CM | POA: Diagnosis not present

## 2023-03-08 DIAGNOSIS — R102 Pelvic and perineal pain: Secondary | ICD-10-CM | POA: Diagnosis not present

## 2023-03-08 DIAGNOSIS — M62838 Other muscle spasm: Secondary | ICD-10-CM | POA: Diagnosis not present

## 2023-03-08 DIAGNOSIS — M25551 Pain in right hip: Secondary | ICD-10-CM

## 2023-03-08 DIAGNOSIS — O26892 Other specified pregnancy related conditions, second trimester: Secondary | ICD-10-CM | POA: Diagnosis not present

## 2023-03-08 DIAGNOSIS — M6281 Muscle weakness (generalized): Secondary | ICD-10-CM

## 2023-03-08 DIAGNOSIS — M25552 Pain in left hip: Secondary | ICD-10-CM | POA: Diagnosis not present

## 2023-03-08 NOTE — Therapy (Signed)
OUTPATIENT PHYSICAL THERAPY TREATMENT NOTE   Patient Name: [name] Curry MRN: [id number] DOB:1981/01/04, 42 y.o., female Today's Date: 03/08/2023  END OF SESSION:  PT End of Session - 03/08/23 1035     Visit Number 6    Authorization Type [place] MEDICAID [facility]    Authorization Time Period 02/08/2023-04/09/2023    Authorization - Visit Number 5    Authorization - Number of Visits 12    PT Start Time 1017    PT Stop Time 1100    PT Time Calculation (min) 43 min    Activity Tolerance Patient tolerated treatment well    Behavior During Therapy [facility] for tasks assessed/performed                 Past Medical History:  Diagnosis Date   Anemia    Anxiety    Arthritis    Beta thalassemia trait    Depression    Gestational diabetes    Graves disease    Hidradenitis    Hypothyroidism 06/30/2016   Palpitations    Pregnancy induced hypertension    Suicidal behavior 08/05/2011   Broke a light bulb and tried to cut her wrists    Past Surgical History:  Procedure Laterality Date   COLPOSCOPY     EXCISIONAL HEMORRHOIDECTOMY  1996   HYDRADENITIS EXCISION Left 12/13/2021   Procedure: EXCISION HIDRADENITIS LEFT AXILLA;  Surgeon: Abigail Miyamoto, MD;  Location: [facility];  Service: General;  Laterality: Left;   LAPAROSCOPIC BILATERAL SALPINGECTOMY Bilateral 02/14/2023   Procedure: LAPAROSCOPIC BILATERAL SALPINGECTOMY;  Surgeon: Jerene Bears, MD;  Location: [facility];  Service: Gynecology;  Laterality: Bilateral;   THYROIDECTOMY N/A 06/30/2016   Procedure: TOTAL THYROIDECTOMY;  Surgeon: Darnell Level, MD;  Location: [facility] OR;  Service: General;  Laterality: N/A;   Patient Active Problem List   Diagnosis Date Noted   Request for sterilization 02/14/2023   BMI 40.0-44.9, adult (HCC) 02/14/2023   Vaginal delivery 09/26/2022   Cervical laceration 09/26/2022   Group beta Strep positive 09/17/2022   History of gestational hypertension 08/11/2022    Penicillin allergy 07/27/2022   Bilateral carpal tunnel syndrome 06/19/2022   Supervision of high risk pregnancy, antepartum 03/08/2022   Muscle spasms of neck 02/06/2022   Beta thalassemia trait 10/07/2021   NSVT (nonsustained ventricular tachycardia) (HCC) 10/07/2021   Pre-diabetes 07/22/2021   Vitamin D deficiency 07/22/2021   Mixed hyperlipidemia 07/22/2021   BMI 36.0-36.9,adult 07/14/2021   Family history of breast cancer in sister 10/22/2019   H/O Graves' disease 09/05/2016   History of thyroidectomy 12/01/2013    PCP: Cyril Mourning, FNP  REFERRING PROVIDER: Cyril Mourning, FNP  REFERRING DIAG: [phone] (ICD-10-CM) - Pain in left hip  M54.50,G89.29 (ICD-10-CM) - Chronic bilateral low back pain without sciatica  M25.551,M25.552,G89.29 (ICD-10-CM) - Chronic pain of both hips  Rationale for Evaluation and Treatment: Rehabilitation  THERAPY DIAG:  Muscle weakness (generalized)  Bilateral hip pain  Other muscle spasm  Other low back pain  ONSET DATE: 3 years ago  SUBJECTIVE:  SUBJECTIVE STATEMENT: Patient reports she is doing okay today. She is currently having 3/10 hip pain. She has not have a chance to do all of her hip exercises but she regularly does the piriformis stretch and stair exercises. Negotiating stairs and laying on her hips is still painful.  PERTINENT HISTORY:  S/P laparascopic bilateral salpingectomy on 02/14/2023, Depression, anxiety, OA, Suicidal Behavior 08/05/2011  Messaged Dr. Hyacinth Meeker On 02/20/2023: no heavy lifting >15 pounds for 1 more week (until 02/27/2023) and no abdominal muscle contraction or manual massage to abdomen until 02/27/2023.    PAIN: 02/27/2023 Are you having pain? Yes: NPRS scale: 2/10 Pain location: bilateral lateral/ anterior hips; bilateral  lower back Pain description: achy; sharp at times; at times hip gives away when bending Aggravating factors: Laying on hips; walking up the stairs; sitting more than 20 - 30 minutes; carrying daughter Relieving factors: Heat helps the leg pain but not the hips and back  PRECAUTIONS: Other: no heavy lifting >15 pounds for 1 more week (until 02/27/2023) and no abdominal muscle contraction or manual massage to abdomen until 02/27/2023.     WEIGHT BEARING RESTRICTIONS: No  FALLS:  Has patient fallen in last 6 months? 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 Minimal degenerative arthritis of both hips. Mild sclerotic arthritis of both sacroiliac joints.  PATIENT SURVEYS:  Eval:  Modified Oswestry 30/50; 60% perceived disability   SCREENING FOR RED FLAGS: Bowel or bladder incontinence: Yes: bladder incontinence due to being postpartum  Spinal tumors: No Cauda equina syndrome: No Compression fracture: No Abdominal aneurysm: No  COGNITION: Overall cognitive status: Within functional limits for tasks assessed       POSTURE: rounded shoulders  PALPATION: Increased tenderness and taut muscle bands of Lt piriformis and TFL. Right hip musculature not assessed due to patient's pain while laying on Lt side. Increased tenderness and taut muscle bands of bilateral erector spinae muscles. Decreased mobility and pain L3-L4  LUMBAR ROM:   AROM eval   Flexion To ankles  Extension 30%  Right lateral flexion To joint line  Left lateral flexion To joint line  Right rotation WFL  Left rotation WFL   (Blank rows = not tested)   LOWER EXTREMITY MMT:    MMT Right eval Left eval  Hip flexion 4 4-  Hip extension    Hip abduction 4- 4-  Hip adduction 4 4  Hip internal rotation    Hip external rotation    Knee flexion 4+ 4+  Knee extension 4+ 4+  Ankle dorsiflexion    Ankle plantarflexion    Ankle inversion    Ankle eversion     (Blank rows = not tested)    GAIT:Antalgic gait, decreased step length  TODAY'S TREATMENT:      Date:  03/08/2023 - Recumbent bike level 2 x7 min with PT present to discuss status - Side stepping with green loop around knees.  - Standing hip abduction with on leg on wall green loop around  leg 2 x 10 each - Leg Press bilateral 50# (will increase weight next time) 2 x 10; unilateral 50# 2 x 10 - Standing Pallof Press light loop 2 x 10 Trigger Point Dry-Needling  Treatment instructions: Expect mild to moderate muscle soreness. S/S of pneumothorax if dry needled over a lung field, and to seek immediate medical attention should they occur. Patient verbalized understanding of these instructions and education. Patient Consent Given: Yes Education handout provided: Yes Muscles treated: left lumbar multifidi, Left piriformis Electrical stimulation performed: No Parameters: N/A Treatment response/outcome: Utilized skilled palpation to identify bony landmarks and trigger points.  Able to illicit twitch response and muscle elongation.  Soft tissue mobilization following to further promote tissue elongation.  - LTR  - PPT  - Piriformis stretch                                                                                                                      DATE:  03/06/2023 Recumbent bike level 3 x6 min with PT present to discuss status Standing hip abduction with on leg on wall green loop around leg 2 x  10 each Leg Press bilateral 50# (will increase weight next time) 2 x 10; unilateral 50# 2 x 10 Standing March with unilateral 7# DB hold x10 each  Log Roll Technique  Standing hip adductor stretch at barre x 5 each 10 sec hold Side Steps with blue loop x 4 Monster Walks with blue loop x 4  Standing toe taps with blue loop (forwards, lateral , backwards) x 8 each side Standing Pallof Press light loop 2 x 10 Sit to stands 5 lb KB 2 x 10 Standing hamstring stretch on 2nd stair 30 sec each Standing hip flexor stretch on 2nd stair 30 sec each Seated piriformis stretch 30  sec bilat   Seated hamstring stretch 2x20 sec bilat Seated piriformis stretch 2x20 sec bilat Attempted bridges but patient had some discomfort in her abdomen Clamshells 2 x 10 each side Standing hip flexor stretch on 2nd step 10 x each leg 5 sec hold Standing at barre with blue loop:  hip abduction and hip extension.  2x10 each bilat Standing March with unilateral KB hold x10 each Standing hip adductor stretch at barre x 10 each 3- 4 sec hold 6 in step ups + knee drive with unilateral UE support 2 x 10 Leg Press bilateral 50# (will increase weight next time) 2 x 10; unilateral 50# 2 x 10   02/27/2023 Recumbent bike level 2 x7 min with PT present to discuss status Seated hamstring stretch 2x20 sec bilat Seated piriformis stretch 2x20 sec bilat Attempted bridges but patient had some discomfort in her abdomen Clamshells 2 x 10 each side Standing hip flexor stretch on 2nd step 10 x each leg 5 sec hold Standing at barre with blue loop:  hip abduction and hip extension.  2x10 each bilat Standing March with unilateral KB hold x10 each Standing hip adductor stretch at barre x 10 each 3-  4 sec hold 6 in step ups + knee drive with unilateral UE support 2 x 10 Leg Press bilateral 50# (will increase weight next time) 2 x 10; unilateral 50# 2 x 10   DATE:  02/22/2023 Recumbent bike level 2 x7 min with PT present to discuss  status Seated hamstring stretch 2x20 sec bilat Seated piriformis stretch 2x20 sec bilat Sit to/from stand 2x10 Standing at barre with blue loop:  hip abduction and hip extension.  2x10 each bilat Trigger Point Dry-Needling  Treatment instructions: Expect mild to moderate muscle soreness. S/S of pneumothorax if dry needled over a lung field, and to seek immediate medical attention should they occur. Patient verbalized understanding of these instructions and education. Patient Consent Given: Yes Education handout provided: Yes Muscles treated: left lumbar multifidi, bilateral piriformis Electrical stimulation performed: No Parameters: N/A Treatment response/outcome: Utilized skilled palpation to identify bony landmarks and trigger points.  Able to illicit twitch response and muscle elongation.  Soft tissue mobilization following to further promote tissue elongation.     PATIENT EDUCATION:  Education details: [id number] Person educated: Patient Education method: Programmer, multimedia, Demonstration, Verbal cues, and Handouts Education comprehension: verbalized understanding, returned demonstration, and needs further education  HOME EXERCISE PROGRAM: Access Code: [facility] URL: [email or web address] Date: 02/08/2023 Prepared by: Claude Manges  Exercises - Seated Piriformis Stretch  - 1 x daily - 7 x weekly - 1 sets - 8 reps - 30 hold - Standing ITB Stretch  - 1 x daily - 7 x weekly - 1 sets - 8 reps - 30 hold - Seated Transversus Abdominis Bracing  - 1 x daily - 7 x weekly - 3 sets - 8 reps - 30 hold  ASSESSMENT:  CLINICAL IMPRESSION: Today's treatment session focused on hip strengthening. Patient required moderate verbal cues while performing exercises for proper execution. Patient tolerated treatment session well and did not experience any increased pain. TPDN to L multifidi, and piriformis with twitches noted in piriformis. No adverse effects noted. Patient had tightness around hip  that responded well to stretching following TPDN at end of treatment session. Discussed getting pelvic floor PT eval due to recent trauma due to birth of baby. Patient will benefit from skilled PT to address the below impairments and improve overall function.  OBJECTIVE IMPAIRMENTS: Abnormal gait, decreased mobility, decreased ROM, decreased strength, hypomobility, increased muscle spasms, impaired flexibility, improper body mechanics, postural dysfunction, and pain.   ACTIVITY LIMITATIONS: carrying, lifting, bending, sitting, squatting, sleeping, stairs, bed mobility, and continence  PARTICIPATION LIMITATIONS: laundry, driving, shopping, and community activity  PERSONAL FACTORS: 3+ comorbidities: OA, depression, anxiety  are also affecting patient's functional outcome.   REHAB POTENTIAL: Good  CLINICAL DECISION MAKING: Stable/uncomplicated  EVALUATION COMPLEXITY: Low   GOALS: Goals reviewed with patient? Yes  SHORT TERM GOALS: Target date: 03/08/2023  Patient will demonstrate independence in initial HEP. Baseline: Goal status: MET  2.  Patient will verbalize 25% improvement in functional ability. Baseline:  Goal status: Ongoing  3.  Patient will demonstrate correct body mechanics while completing lifting and carrying tasks. Baseline:  Goal status: Ongoing   LONG TERM GOALS: Target date: 04/05/2023  Patient will demonstrate independence in advanced HEP. Baseline:  Goal status: INITIAL  2.  Patient will verbalize 50% improvement in functional ability. Baseline:  Goal status: INITIAL  3.  Patient will improve ODI score to > or = to 20/50. Baseline: 30/50 Goal status: INITIAL  4. Patient will be able to carry her daughter with < or = to 3/10  back pain. Baseline:  Goal status: INITIAL    PLAN:  PT FREQUENCY: 2x/week  PT DURATION: 8 weeks  PLANNED INTERVENTIONS: Therapeutic exercises, Therapeutic activity, Neuromuscular re-education, Balance training, Gait  training, Patient/Family education, Self Care, Joint mobilization, Joint manipulation, Stair training, Vestibular training, Canalith repositioning, Aquatic Therapy, Dry Needling, Electrical stimulation, Spinal manipulation, Spinal mobilization, Cryotherapy, Moist heat, scar mobilization, Taping, Vasopneumatic device, Traction, Ultrasound, Biofeedback, Ionotophoresis 4mg /ml Dexamethasone, and Manual therapy.  PLAN FOR NEXT SESSION: Continue core & hip strengthening as tolerated; DN next treatment session, follow up with referral for pelvic floor.   Royal Hawthorn PT, DPT 03/08/23  11:05 AM  [facility] [address], [address] [place], [place] [address] Phone # [phone] Fax [phone]

## 2023-03-09 ENCOUNTER — Encounter (HOSPITAL_BASED_OUTPATIENT_CLINIC_OR_DEPARTMENT_OTHER): Payer: Self-pay | Admitting: Obstetrics & Gynecology

## 2023-03-09 DIAGNOSIS — M6289 Other specified disorders of muscle: Secondary | ICD-10-CM

## 2023-03-13 ENCOUNTER — Encounter: Payer: Self-pay | Admitting: Physical Therapy

## 2023-03-13 ENCOUNTER — Ambulatory Visit: Payer: Medicaid Other | Admitting: Physical Therapy

## 2023-03-13 DIAGNOSIS — M25552 Pain in left hip: Secondary | ICD-10-CM

## 2023-03-13 DIAGNOSIS — O26892 Other specified pregnancy related conditions, second trimester: Secondary | ICD-10-CM | POA: Diagnosis not present

## 2023-03-13 DIAGNOSIS — M6281 Muscle weakness (generalized): Secondary | ICD-10-CM | POA: Diagnosis not present

## 2023-03-13 DIAGNOSIS — M5459 Other low back pain: Secondary | ICD-10-CM

## 2023-03-13 DIAGNOSIS — M62838 Other muscle spasm: Secondary | ICD-10-CM | POA: Diagnosis not present

## 2023-03-13 DIAGNOSIS — M25542 Pain in joints of left hand: Secondary | ICD-10-CM | POA: Diagnosis not present

## 2023-03-13 DIAGNOSIS — R102 Pelvic and perineal pain: Secondary | ICD-10-CM | POA: Diagnosis not present

## 2023-03-13 DIAGNOSIS — M25551 Pain in right hip: Secondary | ICD-10-CM

## 2023-03-13 NOTE — Therapy (Signed)
OUTPATIENT PHYSICAL THERAPY TREATMENT NOTE   Patient Name: [name] Curry MRN: [id number] DOB:1980-08-23, 42 y.o., female Today's Date: 03/13/2023  END OF SESSION:  PT End of Session - 03/13/23 1055     Visit Number 7    Authorization Type [place] MEDICAID [facility]    Authorization Time Period 02/08/2023-04/09/2023    Authorization - Visit Number 6    Authorization - Number of Visits 12    PT Start Time 1019    PT Stop Time 1057    PT Time Calculation (min) 38 min    Activity Tolerance Patient tolerated treatment well    Behavior During Therapy [facility] for tasks assessed/performed                  Past Medical History:  Diagnosis Date   Anemia    Anxiety    Arthritis    Beta thalassemia trait    Depression    Gestational diabetes    Graves disease    Hidradenitis    Hypothyroidism 06/30/2016   Palpitations    Pregnancy induced hypertension    Suicidal behavior 08/05/2011   Broke a light bulb and tried to cut her wrists    Past Surgical History:  Procedure Laterality Date   COLPOSCOPY     EXCISIONAL HEMORRHOIDECTOMY  1996   HYDRADENITIS EXCISION Left 12/13/2021   Procedure: EXCISION HIDRADENITIS LEFT AXILLA;  Surgeon: Abigail Miyamoto, MD;  Location: [facility];  Service: General;  Laterality: Left;   LAPAROSCOPIC BILATERAL SALPINGECTOMY Bilateral 02/14/2023   Procedure: LAPAROSCOPIC BILATERAL SALPINGECTOMY;  Surgeon: Jerene Bears, MD;  Location: [facility];  Service: Gynecology;  Laterality: Bilateral;   THYROIDECTOMY N/A 06/30/2016   Procedure: TOTAL THYROIDECTOMY;  Surgeon: Darnell Level, MD;  Location: [facility] OR;  Service: General;  Laterality: N/A;   Patient Active Problem List   Diagnosis Date Noted   Request for sterilization 02/14/2023   BMI 40.0-44.9, adult (HCC) 02/14/2023   Vaginal delivery 09/26/2022   Cervical laceration 09/26/2022   Group beta Strep positive 09/17/2022   History of gestational hypertension 08/11/2022    Penicillin allergy 07/27/2022   Bilateral carpal tunnel syndrome 06/19/2022   Supervision of high risk pregnancy, antepartum 03/08/2022   Muscle spasms of neck 02/06/2022   Beta thalassemia trait 10/07/2021   NSVT (nonsustained ventricular tachycardia) (HCC) 10/07/2021   Pre-diabetes 07/22/2021   Vitamin D deficiency 07/22/2021   Mixed hyperlipidemia 07/22/2021   BMI 36.0-36.9,adult 07/14/2021   Family history of breast cancer in sister 10/22/2019   H/O Graves' disease 09/05/2016   History of thyroidectomy 12/01/2013    PCP: Cyril Mourning, FNP  REFERRING PROVIDER: Cyril Mourning, FNP  REFERRING DIAG: [id number] (ICD-10-CM) - Pain in left hip  M54.50,G89.29 (ICD-10-CM) - Chronic bilateral low back pain without sciatica  M25.551,M25.552,G89.29 (ICD-10-CM) - Chronic pain of both hips  Rationale for Evaluation and Treatment: Rehabilitation  THERAPY DIAG:  Muscle weakness (generalized)  Other muscle spasm  Other low back pain  Bilateral hip pain  ONSET DATE: 3 years ago  SUBJECTIVE:  SUBJECTIVE STATEMENT: Patient reports she is doing good today. She is not currently having any pain. She felt some pain and discomfort after dry needling last session but overall felt relief.  PERTINENT HISTORY:  S/P laparascopic bilateral salpingectomy on 02/14/2023, Depression, anxiety, OA, Suicidal Behavior 08/05/2011  Messaged Dr. Hyacinth Meeker On 02/20/2023: no heavy lifting >15 pounds for 1 more week (until 02/27/2023) and no abdominal muscle contraction or manual massage to abdomen until 02/27/2023.    PAIN: 02/27/2023 Are you having pain? Yes: NPRS scale: 2/10 Pain location: bilateral lateral/ anterior hips; bilateral lower back Pain description: achy; sharp at times; at times hip gives away when  bending Aggravating factors: Laying on hips; walking up the stairs; sitting more than 20 - 30 minutes; carrying daughter Relieving factors: Heat helps the leg pain but not the hips and back  PRECAUTIONS: Other: no heavy lifting >15 pounds for 1 more week (until 02/27/2023) and no abdominal muscle contraction or manual massage to abdomen until 02/27/2023.     WEIGHT BEARING RESTRICTIONS: No  FALLS:  Has patient fallen in last 6 months? 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 Minimal degenerative arthritis of both hips. Mild sclerotic arthritis of both sacroiliac joints.  PATIENT SURVEYS:  Eval:  Modified Oswestry 30/50; 60% perceived disability   SCREENING FOR RED FLAGS: Bowel or bladder incontinence: Yes: bladder incontinence due to being postpartum  Spinal tumors: No Cauda equina syndrome: No Compression fracture: No Abdominal aneurysm: No  COGNITION: Overall cognitive status: Within functional limits for tasks assessed       POSTURE: rounded shoulders  PALPATION: Increased tenderness and taut muscle bands of Lt piriformis and TFL. Right hip musculature not assessed due to patient's pain while laying on Lt side. Increased tenderness and taut muscle bands of bilateral erector spinae muscles. Decreased mobility and pain L3-L4  LUMBAR ROM:   AROM eval  Flexion To ankles  Extension 30%  Right lateral flexion To joint line  Left  lateral flexion To joint line  Right rotation WFL  Left rotation WFL   (Blank rows = not tested)   LOWER EXTREMITY MMT:    MMT Right eval Left eval  Hip flexion 4 4-  Hip extension    Hip abduction 4- 4-  Hip adduction 4 4  Hip internal rotation    Hip external rotation    Knee flexion 4+ 4+  Knee extension 4+ 4+  Ankle dorsiflexion    Ankle plantarflexion    Ankle inversion    Ankle eversion     (Blank rows = not tested)    GAIT:Antalgic gait, decreased step length  TODAY'S TREATMENT:      Date:  03/13/2023 Recumbent bike level 3 x6 min with PT present to discuss status Standing hip abduction with one leg on wall blue loop around leg 2 x 10 each Monster Walks with blue loop x 4  Step Ups 6 inch step holding 5 lb KB 2 x 10 Monster Walks with blue  loop x 4  Sit to stands 5 lb KB 2 x 10 Leg Press bilateral 100#  3 x 10; unilateral 55# 2 x 10 Seated piriformis stretch 2 x 30 sec Clamshells 2 x 10 Reverse Clamshells  2 x 10 Bridges 2 x 8   03/08/2023 - Recumbent bike level 2 x7 min with PT present to discuss status - Side stepping with green loop around knees.  - Standing hip abduction with on leg on wall green loop around leg 2 x 10 each - Leg Press bilateral 50# (will increase weight next time) 2 x 10; unilateral 50# 2 x 10 - Standing Pallof Press light loop 2 x 10 Trigger Point Dry-Needling  Treatment instructions: Expect mild to moderate muscle soreness. S/S of pneumothorax if dry needled over a lung field, and to seek immediate medical attention should they occur. Patient verbalized understanding of these instructions and education. Patient Consent Given: Yes Education handout provided: Yes Muscles treated: left lumbar multifidi, Left piriformis Electrical stimulation performed: No Parameters: N/A Treatment response/outcome: Utilized skilled palpation to identify bony landmarks and trigger points.  Able to illicit twitch response and muscle elongation.  Soft  tissue mobilization following to further promote tissue elongation.  - LTR  - PPT  - Piriformis stretch                                                                                                                      DATE:  03/06/2023 Recumbent bike level 3 x6 min with PT present to discuss status Standing hip abduction with on leg on wall green loop around leg 2 x 10 each Leg Press bilateral 50# (will increase weight next time) 2 x 10; unilateral 50# 2 x 10 Standing March with unilateral 7# DB hold x10 each  Log Roll Technique  Standing hip adductor stretch at barre x 5 each 10 sec hold Side Steps with blue loop x 4 Monster Walks with blue loop x 4  Standing toe taps with blue loop (forwards, lateral , backwards) x 8 each side Standing Pallof Press light loop 2 x 10 Sit to stands 5 lb KB 2 x 10 Standing hamstring stretch on 2nd stair 30 sec each Standing hip flexor stretch on 2nd stair 30 sec each Seated piriformis stretch 30  sec bilat    PATIENT EDUCATION:  Education details: [id number] Person educated: Patient Education method: Programmer, multimedia, Facilities manager, Verbal cues, and Handouts Education comprehension: verbalized understanding, returned demonstration, and needs further education  HOME EXERCISE PROGRAM: Access Code: [facility] URL: [email or web address] Date: 02/08/2023 Prepared by: Claude Manges  Exercises - Seated Piriformis Stretch  - 1 x daily - 7 x weekly - 1 sets - 8 reps - 30 hold - Standing ITB Stretch  - 1 x daily - 7 x weekly - 1 sets - 8 reps - 30 hold - Seated Transversus Abdominis Bracing  - 1 x daily - 7 x weekly - 3 sets - 8 reps - 30 hold  ASSESSMENT:  CLINICAL IMPRESSION:  Today's treatment session focused on hip strengthening. Patient presented to therapy pain free today and feeling good. Patient verbalized relief from doing dry needling last treatment session. Required moderate verbal cues for correct exercise performance. Patient has  been walking for 30 minutes in her neighborhood multiple times a week and that has gone well. She reports that her sleep quality, bed mobility, and stairs are better since beginning therapy. Sitting and laying on her side is still challenging for her. Patient scheduled a pelvic floor therapy appointment in January. Patient will benefit from skilled PT to address the below impairments and improve overall function.   OBJECTIVE IMPAIRMENTS: Abnormal gait, decreased mobility, decreased ROM, decreased strength, hypomobility, increased muscle spasms, impaired flexibility, improper body mechanics, postural dysfunction, and pain.   ACTIVITY LIMITATIONS: carrying, lifting, bending, sitting, squatting, sleeping, stairs, bed mobility, and continence  PARTICIPATION LIMITATIONS: laundry, driving, shopping, and community activity  PERSONAL FACTORS: 3+ comorbidities: OA, depression, anxiety  are also affecting patient's functional outcome.   REHAB POTENTIAL: Good  CLINICAL DECISION MAKING: Stable/uncomplicated  EVALUATION COMPLEXITY: Low   GOALS: Goals reviewed with patient? Yes  SHORT TERM GOALS: Target date: 03/08/2023  Patient will demonstrate independence in initial HEP. Baseline: Goal status: MET  2.  Patient will verbalize 25% improvement in functional ability. Baseline:  Goal status: Ongoing  3.  Patient will demonstrate correct body mechanics while completing lifting and carrying tasks. Baseline:  Goal status: Ongoing   LONG TERM GOALS: Target date: 04/05/2023  Patient will demonstrate independence in advanced HEP. Baseline:  Goal status: INITIAL  2.  Patient will verbalize 50% improvement in functional ability. Baseline:  Goal status: INITIAL  3.  Patient will improve ODI score to > or = to 20/50. Baseline: 30/50 Goal status: INITIAL  4. Patient will be able to carry her daughter with < or = to 3/10 back pain. Baseline:  Goal status: INITIAL    PLAN:  PT FREQUENCY:  2x/week  PT DURATION: 8 weeks  PLANNED INTERVENTIONS: Therapeutic exercises, Therapeutic activity, Neuromuscular re-education, Balance training, Gait training, Patient/Family education, Self Care, Joint mobilization, Joint manipulation, Stair training, Vestibular training, Canalith repositioning, Aquatic Therapy, Dry Needling, Electrical stimulation, Spinal manipulation, Spinal mobilization, Cryotherapy, Moist heat, scar mobilization, Taping, Vasopneumatic device, Traction, Ultrasound, Biofeedback, Ionotophoresis 4mg /ml Dexamethasone, and Manual therapy.  PLAN FOR NEXT SESSION: Continue core & hip strengthening    Claude Manges, PT 03/13/23 10:56 AM  [facility] [address] [place], [place] [address] Phone # [phone] Fax [phone]

## 2023-03-15 ENCOUNTER — Ambulatory Visit: Payer: Medicaid Other | Admitting: Physical Therapy

## 2023-03-15 ENCOUNTER — Telehealth: Payer: Self-pay | Admitting: Physical Therapy

## 2023-03-15 NOTE — Telephone Encounter (Signed)
Called patient about missed appointment today at 10:15am. Patient's daughter is sick and she forgot to call and cancel her appointment. She plans to make her next appointment on 10/29.  Claude Manges, PT 03/15/23 10:49 AM

## 2023-03-19 ENCOUNTER — Telehealth: Payer: Self-pay

## 2023-03-19 ENCOUNTER — Other Ambulatory Visit: Payer: Self-pay | Admitting: Nurse Practitioner

## 2023-03-19 DIAGNOSIS — Z803 Family history of malignant neoplasm of breast: Secondary | ICD-10-CM

## 2023-03-19 DIAGNOSIS — N6320 Unspecified lump in the left breast, unspecified quadrant: Secondary | ICD-10-CM

## 2023-03-19 DIAGNOSIS — Z0000 Encounter for general adult medical examination without abnormal findings: Secondary | ICD-10-CM

## 2023-03-19 NOTE — Telephone Encounter (Signed)
Sue Lush from [facility] called bc pts insurance needs other imaging before mammo. Pt needs orders for diagnostic mammo and left breast US. States her extension is [phone] if any questions

## 2023-03-20 ENCOUNTER — Ambulatory Visit: Payer: Medicaid Other | Admitting: Physical Therapy

## 2023-03-20 ENCOUNTER — Encounter: Payer: Self-pay | Admitting: Physical Therapy

## 2023-03-20 DIAGNOSIS — M6281 Muscle weakness (generalized): Secondary | ICD-10-CM | POA: Diagnosis not present

## 2023-03-20 DIAGNOSIS — M62838 Other muscle spasm: Secondary | ICD-10-CM | POA: Diagnosis not present

## 2023-03-20 DIAGNOSIS — R102 Pelvic and perineal pain: Secondary | ICD-10-CM | POA: Diagnosis not present

## 2023-03-20 DIAGNOSIS — M5459 Other low back pain: Secondary | ICD-10-CM | POA: Diagnosis not present

## 2023-03-20 DIAGNOSIS — M25542 Pain in joints of left hand: Secondary | ICD-10-CM | POA: Diagnosis not present

## 2023-03-20 DIAGNOSIS — M25551 Pain in right hip: Secondary | ICD-10-CM

## 2023-03-20 DIAGNOSIS — M25552 Pain in left hip: Secondary | ICD-10-CM

## 2023-03-20 DIAGNOSIS — O26892 Other specified pregnancy related conditions, second trimester: Secondary | ICD-10-CM | POA: Diagnosis not present

## 2023-03-20 NOTE — Therapy (Signed)
OUTPATIENT PHYSICAL THERAPY TREATMENT NOTE   Patient Name: [name] Curry MRN: [id number] DOB:[birth year], 42 y.o., female Today's Date: 03/20/2023  END OF SESSION:  PT End of Session - 03/20/23 1109     Visit Number 8    Authorization Type [place] MEDICAID [facility]    Authorization Time Period 02/08/2023-04/09/2023    Authorization - Visit Number 7    Authorization - Number of Visits 12    PT Start Time 1021    PT Stop Time 1100    PT Time Calculation (min) 39 min    Activity Tolerance Patient tolerated treatment well    Behavior During Therapy [facility] for tasks assessed/performed                   Past Medical History:  Diagnosis Date   Anemia    Anxiety    Arthritis    Beta thalassemia trait    Depression    Gestational diabetes    Graves disease    Hidradenitis    Hypothyroidism 06/30/2016   Palpitations    Pregnancy induced hypertension    Suicidal behavior 08/05/2011   Broke a light bulb and tried to cut her wrists    Past Surgical History:  Procedure Laterality Date   COLPOSCOPY     EXCISIONAL HEMORRHOIDECTOMY  1996   HYDRADENITIS EXCISION Left 12/13/2021   Procedure: EXCISION HIDRADENITIS LEFT AXILLA;  Surgeon: Abigail Miyamoto, MD;  Location: [facility];  Service: General;  Laterality: Left;   LAPAROSCOPIC BILATERAL SALPINGECTOMY Bilateral 02/14/2023   Procedure: LAPAROSCOPIC BILATERAL SALPINGECTOMY;  Surgeon: Jerene Bears, MD;  Location: [facility];  Service: Gynecology;  Laterality: Bilateral;   THYROIDECTOMY N/A 06/30/2016   Procedure: TOTAL THYROIDECTOMY;  Surgeon: Darnell Level, MD;  Location: [facility] OR;  Service: General;  Laterality: N/A;   Patient Active Problem List   Diagnosis Date Noted   Request for sterilization 02/14/2023   BMI 40.0-44.9, adult (HCC) 02/14/2023   Vaginal delivery 09/26/2022   Cervical laceration 09/26/2022   Group beta Strep positive 09/17/2022   History of gestational hypertension 08/11/2022    Penicillin allergy 07/27/2022   Bilateral carpal tunnel syndrome 06/19/2022   Supervision of high risk pregnancy, antepartum 03/08/2022   Muscle spasms of neck 02/06/2022   Beta thalassemia trait 10/07/2021   NSVT (nonsustained ventricular tachycardia) (HCC) 10/07/2021   Pre-diabetes 07/22/2021   Vitamin D deficiency 07/22/2021   Mixed hyperlipidemia 07/22/2021   BMI 36.0-36.9,adult 07/14/2021   Family history of breast cancer in sister 10/22/2019   H/O Graves' disease 09/05/2016   History of thyroidectomy 12/01/2013    PCP: Cyril Mourning, FNP  REFERRING PROVIDER: Cyril Mourning, FNP  REFERRING DIAG: [phone] (ICD-10-CM) - Pain in left hip  M54.50,G89.29 (ICD-10-CM) - Chronic bilateral low back pain without sciatica  M25.551,M25.552,G89.29 (ICD-10-CM) - Chronic pain of both hips  Rationale for Evaluation and Treatment: Rehabilitation  THERAPY DIAG:  Muscle weakness (generalized)  Other muscle spasm  Other low back pain  Bilateral hip pain  ONSET DATE: 3 years ago  SUBJECTIVE:  SUBJECTIVE STATEMENT: Patient's hips were very painful over the weekend. She was in excruciating 11/10 pain and going up stairs was very hard. She is unsure if she had a flare up due to missing last treatment session. She tried to do some of her exercises but they were not really helpful. Currently her pain is 4/10.  PERTINENT HISTORY:  S/P laparascopic bilateral salpingectomy on 02/14/2023, Depression, anxiety, OA, Suicidal Behavior 08/05/2011  Messaged Dr. Hyacinth Meeker On 02/20/2023: no heavy lifting >15 pounds for 1 more week (until 02/27/2023) and no abdominal muscle contraction or manual massage to abdomen until 02/27/2023.    PAIN: 03/20/2023 Are you having pain? Yes: NPRS scale: 4/10 Pain location:  bilateral lateral/ anterior hips; bilateral lower back Pain description: achy; sharp at times; at times hip gives away when bending Aggravating factors: Laying on hips; walking up the stairs; sitting more than 20 - 30 minutes; carrying daughter Relieving factors: Heat helps the leg pain but not the hips and back  PRECAUTIONS: Other: no heavy lifting >15 pounds for 1 more week (until 02/27/2023) and no abdominal muscle contraction or manual massage to abdomen until 02/27/2023.     WEIGHT BEARING RESTRICTIONS: No  FALLS:  Has patient fallen in last 6 months? 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 Minimal degenerative arthritis of both hips. Mild sclerotic arthritis of both sacroiliac joints.  PATIENT SURVEYS:  Eval:  Modified Oswestry 30/50; 60% perceived disability   SCREENING FOR RED FLAGS: Bowel or bladder incontinence: Yes: bladder incontinence due to being postpartum  Spinal tumors: No Cauda equina syndrome: No Compression fracture: No Abdominal aneurysm: No  COGNITION: Overall cognitive status: Within functional limits for tasks assessed       POSTURE: rounded shoulders  PALPATION: Increased tenderness and taut muscle bands of Lt piriformis and TFL. Right hip musculature not assessed due to patient's pain while laying on Lt side. Increased tenderness and taut muscle bands of bilateral erector spinae muscles. Decreased mobility  and pain L3-L4  LUMBAR ROM:   AROM eval  Flexion To ankles  Extension 30%  Right lateral flexion To joint line  Left lateral flexion To joint line  Right rotation WFL  Left rotation WFL   (Blank rows = not tested)   LOWER EXTREMITY MMT:    MMT Right eval Left eval  Hip flexion 4 4-  Hip extension    Hip abduction 4- 4-  Hip adduction 4 4  Hip internal rotation    Hip external rotation    Knee flexion 4+ 4+  Knee extension 4+ 4+  Ankle dorsiflexion    Ankle plantarflexion    Ankle inversion    Ankle eversion     (Blank rows = not tested)    GAIT:Antalgic gait, decreased step length  TODAY'S TREATMENT:      Date:   03/20/2023 Standing hamstring stretch 2 x 30 sec Standing hip flexor stretch on stair 10 x 5 sec holds Seated piriformis stretch 2 x 30 sec  Seated 3 way SB stretch x 8 each way Standing "L" stretch  Standing QL stretch on step 4 x 5 sec each Standing pallof press / red loop 2 x 10 each way Standing chops with red loop 2 x 10 each way Farmer's Carries 8# DBs x 4  03/13/2023 Recumbent bike level 3 x6 min with PT present to discuss status Standing hip abduction with one leg on wall blue loop around leg 2 x 10 each Monster Walks with blue loop x 4  Step Ups 6 inch step holding 5 lb KB 2 x 10 Monster Walks with blue loop x 4  Sit to stands 5 lb KB 2 x 10 Leg Press bilateral 100#  3 x 10; unilateral 55# 2 x 10 Seated piriformis stretch 2 x 30 sec Clamshells 2 x 10 Reverse Clamshells  2 x 10 Bridges 2 x 8   03/08/2023 - Recumbent bike level 2 x7 min with PT present to discuss status - Side stepping with green loop around knees.  - Standing hip abduction with on leg on wall green loop around leg 2 x 10 each - Leg Press bilateral 50# (will increase weight next time) 2 x 10; unilateral 50# 2 x 10 - Standing Pallof Press light loop 2 x 10 Trigger Point Dry-Needling  Treatment instructions: Expect mild to moderate muscle soreness. S/S of  pneumothorax if dry needled over a lung field, and to seek immediate medical attention should they occur. Patient verbalized understanding of these instructions and education. Patient Consent Given: Yes Education handout provided: Yes Muscles treated: left lumbar multifidi, Left piriformis Electrical stimulation performed: No Parameters: N/A Treatment response/outcome: Utilized skilled palpation to identify bony landmarks and trigger points.  Able to illicit twitch response and muscle elongation.  Soft tissue mobilization following to further promote tissue elongation.  - LTR  - PPT  - Piriformis stretch                                                                                                                       PATIENT EDUCATION:  Education details: [email or web address] Person educated: Patient Education method: Explanation, Demonstration, Verbal cues, and Handouts Education comprehension: verbalized understanding, returned demonstration, and needs further education  HOME EXERCISE PROGRAM: Access Code: [facility] URL: [email or web address] Date: 02/08/2023 Prepared by: Claude Manges  Exercises - Seated Piriformis Stretch  - 1 x daily - 7 x weekly - 1 sets - 8 reps - 30 hold - Standing ITB Stretch  - 1 x daily - 7 x weekly - 1 sets - 8 reps - 30 hold - Seated Transversus Abdominis Bracing  - 1 x daily - 7 x weekly - 3 sets - 8 reps - 30 hold  ASSESSMENT:  CLINICAL IMPRESSION: Today's treatment session focused on hip and lumbar mobility. Patient was very painful over the weekend and had 11/10 bilateral hip pain. She could not ascend stairs and complete household chores without increased pain. Today her pain was 4/10 but she verbalized feeling tight  in her hips and lower back. She responded well to stretching to hips and lumbar spine. Required verbal and visual cues for correct exercise performance. Patient will benefit from skilled PT to address the below impairments and improve  overall function.    OBJECTIVE IMPAIRMENTS: Abnormal gait, decreased mobility, decreased ROM, decreased strength, hypomobility, increased muscle spasms, impaired flexibility, improper body mechanics, postural dysfunction, and pain.   ACTIVITY LIMITATIONS: carrying, lifting, bending, sitting, squatting, sleeping, stairs, bed mobility, and continence  PARTICIPATION LIMITATIONS: laundry, driving, shopping, and community activity  PERSONAL FACTORS: 3+ comorbidities: OA, depression, anxiety  are also affecting patient's functional outcome.   REHAB POTENTIAL: Good  CLINICAL DECISION MAKING: Stable/uncomplicated  EVALUATION COMPLEXITY: Low   GOALS: Goals reviewed with patient? Yes  SHORT TERM GOALS: Target date: 03/08/2023  Patient will demonstrate independence in initial HEP. Baseline: Goal status: MET  2.  Patient will verbalize 25% improvement in functional ability. Baseline:  Goal status: Ongoing  3.  Patient will demonstrate correct body mechanics while completing lifting and carrying tasks. Baseline:  Goal status: Ongoing   LONG TERM GOALS: Target date: 04/05/2023  Patient will demonstrate independence in advanced HEP. Baseline:  Goal status: INITIAL  2.  Patient will verbalize 50% improvement in functional ability. Baseline:  Goal status: INITIAL  3.  Patient will improve ODI score to > or = to 20/50. Baseline: 30/50 Goal status: INITIAL  4. Patient will be able to carry her daughter with < or = to 3/10 back pain. Baseline:  Goal status: INITIAL    PLAN:  PT FREQUENCY: 2x/week  PT DURATION: 8 weeks  PLANNED INTERVENTIONS: Therapeutic exercises, Therapeutic activity, Neuromuscular re-education, Balance training, Gait training, Patient/Family education, Self Care, Joint mobilization, Joint manipulation, Stair training, Vestibular training, Canalith repositioning, Aquatic Therapy, Dry Needling, Electrical stimulation, Spinal manipulation, Spinal  mobilization, Cryotherapy, Moist heat, scar mobilization, Taping, Vasopneumatic device, Traction, Ultrasound, Biofeedback, Ionotophoresis 4mg /ml Dexamethasone, and Manual therapy.  PLAN FOR NEXT SESSION: assess tolerance to treatment session; Continue core & hip strengthening    Claude Manges, PT 03/20/23 11:10 AM  [facility] [address] [place], [place] [address] Phone # [phone] Fax [phone]

## 2023-03-22 ENCOUNTER — Encounter: Payer: Self-pay | Admitting: Physical Therapy

## 2023-03-22 ENCOUNTER — Ambulatory Visit: Payer: Medicaid Other | Admitting: Physical Therapy

## 2023-03-22 DIAGNOSIS — M6281 Muscle weakness (generalized): Secondary | ICD-10-CM

## 2023-03-22 DIAGNOSIS — M5459 Other low back pain: Secondary | ICD-10-CM | POA: Diagnosis not present

## 2023-03-22 DIAGNOSIS — R102 Pelvic and perineal pain: Secondary | ICD-10-CM | POA: Diagnosis not present

## 2023-03-22 DIAGNOSIS — O26892 Other specified pregnancy related conditions, second trimester: Secondary | ICD-10-CM | POA: Diagnosis not present

## 2023-03-22 DIAGNOSIS — M62838 Other muscle spasm: Secondary | ICD-10-CM

## 2023-03-22 DIAGNOSIS — M25542 Pain in joints of left hand: Secondary | ICD-10-CM | POA: Diagnosis not present

## 2023-03-22 DIAGNOSIS — M25552 Pain in left hip: Secondary | ICD-10-CM | POA: Diagnosis not present

## 2023-03-22 DIAGNOSIS — M25551 Pain in right hip: Secondary | ICD-10-CM

## 2023-03-22 NOTE — Therapy (Signed)
OUTPATIENT PHYSICAL THERAPY TREATMENT NOTE   Patient Name: [name] Curry MRN: [id number] DOB:05/27/1980, 42 y.o., female Today's Date: 03/22/2023  END OF SESSION:  PT End of Session - 03/22/23 1102     Visit Number 9    Authorization Type [place] MEDICAID [facility]    Authorization Time Period 02/08/2023-04/09/2023    Authorization - Visit Number 8    Authorization - Number of Visits 12    PT Start Time 1020    PT Stop Time 1100    PT Time Calculation (min) 40 min    Activity Tolerance Patient tolerated treatment well    Behavior During Therapy [facility] for tasks assessed/performed                    Past Medical History:  Diagnosis Date   Anemia    Anxiety    Arthritis    Beta thalassemia trait    Depression    Gestational diabetes    Graves disease    Hidradenitis    Hypothyroidism 06/30/2016   Palpitations    Pregnancy induced hypertension    Suicidal behavior 08/05/2011   Broke a light bulb and tried to cut her wrists    Past Surgical History:  Procedure Laterality Date   COLPOSCOPY     EXCISIONAL HEMORRHOIDECTOMY  1996   HYDRADENITIS EXCISION Left 12/13/2021   Procedure: EXCISION HIDRADENITIS LEFT AXILLA;  Surgeon: Abigail Miyamoto, MD;  Location: [facility];  Service: General;  Laterality: Left;   LAPAROSCOPIC BILATERAL SALPINGECTOMY Bilateral 02/14/2023   Procedure: LAPAROSCOPIC BILATERAL SALPINGECTOMY;  Surgeon: Jerene Bears, MD;  Location: [facility];  Service: Gynecology;  Laterality: Bilateral;   THYROIDECTOMY N/A 06/30/2016   Procedure: TOTAL THYROIDECTOMY;  Surgeon: Darnell Level, MD;  Location: [facility];  Service: General;  Laterality: N/A;   Patient Active Problem List   Diagnosis Date Noted   Request for sterilization 02/14/2023   BMI 40.0-44.9, adult (HCC) 02/14/2023   Vaginal delivery 09/26/2022   Cervical laceration 09/26/2022   Group beta Strep positive 09/17/2022   History of gestational hypertension  08/11/2022   Penicillin allergy 07/27/2022   Bilateral carpal tunnel syndrome 06/19/2022   Supervision of high risk pregnancy, antepartum 03/08/2022   Muscle spasms of neck 02/06/2022   Beta thalassemia trait 10/07/2021   NSVT (nonsustained ventricular tachycardia) (HCC) 10/07/2021   Pre-diabetes 07/22/2021   Vitamin D deficiency 07/22/2021   Mixed hyperlipidemia 07/22/2021   BMI 36.0-36.9,adult 07/14/2021   Family history of breast cancer in sister 10/22/2019   H/O Graves' disease 09/05/2016   History of thyroidectomy 12/01/2013    PCP: Cyril Mourning, FNP  REFERRING PROVIDER: Cyril Mourning, FNP  REFERRING DIAG: [phone] (ICD-10-CM) - Pain in left hip  M54.50,G89.29 (ICD-10-CM) - Chronic bilateral low back pain without sciatica  M25.551,M25.552,G89.29 (ICD-10-CM) - Chronic pain of both hips  Rationale for Evaluation and Treatment: Rehabilitation  THERAPY DIAG:  Muscle weakness (generalized)  Other muscle spasm  Other low back pain  Bilateral hip pain  ONSET DATE: 3 years ago  SUBJECTIVE:  SUBJECTIVE STATEMENT: Patient reports she is feeling good today. She is not currently having any hip pain.   PERTINENT HISTORY:  S/P laparascopic bilateral salpingectomy on 02/14/2023, Depression, anxiety, OA, Suicidal Behavior 08/05/2011  Messaged Dr. Hyacinth Meeker On 02/20/2023: no heavy lifting >15 pounds for 1 more week (until 02/27/2023) and no abdominal muscle contraction or manual massage to abdomen until 02/27/2023.    PAIN: 03/22/2023 Are you having pain? Yes: NPRS scale: 0/10 Pain location: bilateral lateral/ anterior hips; bilateral lower back Pain description: achy; sharp at times; at times hip gives away when bending Aggravating factors: Laying on hips; walking up the stairs; sitting more  than 20 - 30 minutes; carrying daughter Relieving factors: Heat helps the leg pain but not the hips and back  PRECAUTIONS: Other: no heavy lifting >15 pounds for 1 more week (until 02/27/2023) and no abdominal muscle contraction or manual massage to abdomen until 02/27/2023.     WEIGHT BEARING RESTRICTIONS: No  FALLS:  Has patient fallen in last 6 months? 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 Minimal degenerative arthritis of both hips. Mild sclerotic arthritis of both sacroiliac joints.  PATIENT SURVEYS:  Eval:  Modified Oswestry 30/50; 60% perceived disability   SCREENING FOR RED FLAGS: Bowel or bladder incontinence: Yes: bladder incontinence due to being postpartum  Spinal tumors: No Cauda equina syndrome: No Compression fracture: No Abdominal aneurysm: No  COGNITION: Overall cognitive status: Within functional limits for tasks assessed       POSTURE: rounded shoulders  PALPATION: Increased tenderness and taut muscle bands of Lt piriformis and TFL. Right hip musculature not assessed due to patient's pain while laying on Lt side. Increased tenderness and taut muscle bands of bilateral erector spinae muscles. Decreased mobility and pain L3-L4  LUMBAR ROM:   AROM eval  Flexion To ankles  Extension 30%  Right lateral flexion To joint line  Left lateral flexion To joint line  Right rotation WFL  Left rotation WFL   (Blank  rows = not tested)   LOWER EXTREMITY MMT:    MMT Right eval Left eval  Hip flexion 4 4-  Hip extension    Hip abduction 4- 4-  Hip adduction 4 4  Hip internal rotation    Hip external rotation    Knee flexion 4+ 4+  Knee extension 4+ 4+  Ankle dorsiflexion    Ankle plantarflexion    Ankle inversion    Ankle eversion     (Blank rows = not tested)    GAIT:Antalgic gait, decreased step length  TODAY'S TREATMENT:      Date:  03/22/2023 Recumbent bike level 3 x6 min with PT present to discuss status Hip matrix (abduction & extension) 25#  2 x 10 Standing hip abduction ball squeeze on wall x 10 each  Monster Walks with blue loop x 4  Monster Walks with blue loop x 4  Sit to stands 5 lb KB 2 x 10 Leg Press bilateral 100#  3 x  10; unilateral 55# 2 x 10 Pallof Press cable column 10# x 20 each direction Chops cable column 5# x 20 each direction Standing march with unilateral KB hold 10# 2 x 10 each Farmer's Carries 8# DB x 3 Seated hinging 2 x 10 Standing hinging at wall 2 x 10  03/20/2023 Standing hamstring stretch 2 x 30 sec Standing hip flexor stretch on stair 10 x 5 sec holds Seated piriformis stretch 2 x 30 sec Seated 3 way SB stretch x 8 each way Standing "L" stretch  Standing QL stretch on step 4 x 5 sec each Standing pallof press / red loop 2 x 10 each way Standing chops with red loop 2 x 10 each way Farmer's Carries 8# DBs x 4  03/13/2023 Recumbent bike level 3 x6 min with PT present to discuss status Standing hip abduction with one leg on wall blue loop around leg 2 x 10 each Monster Walks with blue loop x 4  Step Ups 6 inch step holding 5 lb KB 2 x 10 Monster Walks with blue loop x 4  Sit to stands 5 lb KB 2 x 10 Leg Press bilateral 100#  3 x 10; unilateral 55# 2 x 10 Seated piriformis stretch 2 x 30 sec Clamshells 2 x 10 Reverse Clamshells  2 x 10 Bridges 2 x 8                                                                         PATIENT  EDUCATION:  Education details: [employer] Person educated: Patient Education method: Programmer, multimedia, Facilities manager, Verbal cues, and Handouts Education comprehension: verbalized understanding, returned demonstration, and needs further education  HOME EXERCISE PROGRAM: Access Code: [facility] URL: [email or web address] Date: 02/08/2023 Prepared by: Claude Manges  Exercises - Seated Piriformis Stretch  - 1 x daily - 7 x weekly - 1 sets - 8 reps - 30 hold - Standing ITB Stretch  - 1 x daily - 7 x weekly - 1 sets - 8 reps - 30 hold - Seated Transversus Abdominis Bracing  - 1 x daily - 7 x weekly - 3 sets - 8 reps - 30 hold  ASSESSMENT:  CLINICAL IMPRESSION: Today's treatment session focused on hip and core strengthening. Patient was not as painful as previous treatment session and tolerated exercises without increased pain. Progressed patient's weight in pallof press and incorporated more rotational exercises. Educated patient on proper hinging technique in seated and standing. Patient required verbal and visual cues for proper performance. Patient will benefit from skilled PT to address the below impairments and improve overall function.     OBJECTIVE IMPAIRMENTS: Abnormal gait, decreased mobility, decreased ROM, decreased strength, hypomobility, increased muscle spasms, impaired flexibility, improper body mechanics, postural dysfunction, and pain.   ACTIVITY LIMITATIONS: carrying, lifting, bending, sitting, squatting, sleeping, stairs, bed mobility, and continence  PARTICIPATION LIMITATIONS: laundry, driving, shopping, and community activity  PERSONAL FACTORS: 3+ comorbidities: OA, depression, anxiety  are also affecting patient's functional outcome.   REHAB POTENTIAL: Good  CLINICAL DECISION MAKING: Stable/uncomplicated  EVALUATION COMPLEXITY: Low   GOALS: Goals reviewed with patient? Yes  SHORT TERM GOALS: Target date: 03/08/2023  Patient will demonstrate  independence in initial HEP. Baseline: Goal status: MET  2.  Patient  will verbalize 25% improvement in functional ability. Baseline:  Goal status: Ongoing  3.  Patient will demonstrate correct body mechanics while completing lifting and carrying tasks. Baseline:  Goal status: Ongoing   LONG TERM GOALS: Target date: 04/05/2023  Patient will demonstrate independence in advanced HEP. Baseline:  Goal status: INITIAL  2.  Patient will verbalize 50% improvement in functional ability. Baseline:  Goal status: INITIAL  3.  Patient will improve ODI score to > or = to 20/50. Baseline: 30/50 Goal status: INITIAL  4. Patient will be able to carry her daughter with < or = to 3/10 back pain. Baseline:  Goal status: INITIAL    PLAN:  PT FREQUENCY: 2x/week  PT DURATION: 8 weeks  PLANNED INTERVENTIONS: Therapeutic exercises, Therapeutic activity, Neuromuscular re-education, Balance training, Gait training, Patient/Family education, Self Care, Joint mobilization, Joint manipulation, Stair training, Vestibular training, Canalith repositioning, Aquatic Therapy, Dry Needling, Electrical stimulation, Spinal manipulation, Spinal mobilization, Cryotherapy, Moist heat, scar mobilization, Taping, Vasopneumatic device, Traction, Ultrasound, Biofeedback, Ionotophoresis 4mg /ml Dexamethasone, and Manual therapy.  PLAN FOR NEXT SESSION: Dead bugs; 90/90 heel taps; standing hinging  Claude Manges, PT 03/22/23 11:02 AM [facility] [address] [place], [place] [address] Phone # [phone] Fax [phone]

## 2023-03-23 DIAGNOSIS — Z419 Encounter for procedure for purposes other than remedying health state, unspecified: Secondary | ICD-10-CM | POA: Diagnosis not present

## 2023-03-26 ENCOUNTER — Encounter (HOSPITAL_BASED_OUTPATIENT_CLINIC_OR_DEPARTMENT_OTHER): Payer: Self-pay | Admitting: Obstetrics & Gynecology

## 2023-03-27 ENCOUNTER — Encounter (HOSPITAL_BASED_OUTPATIENT_CLINIC_OR_DEPARTMENT_OTHER): Payer: Self-pay | Admitting: Obstetrics & Gynecology

## 2023-03-27 ENCOUNTER — Encounter: Payer: Self-pay | Admitting: Physical Therapy

## 2023-03-27 ENCOUNTER — Ambulatory Visit: Payer: Medicaid Other | Attending: Registered Nurse | Admitting: Physical Therapy

## 2023-03-27 ENCOUNTER — Ambulatory Visit (INDEPENDENT_AMBULATORY_CARE_PROVIDER_SITE_OTHER): Payer: Medicaid Other | Admitting: Obstetrics & Gynecology

## 2023-03-27 VITALS — BP 122/82 | HR 80 | Wt 231.8 lb

## 2023-03-27 DIAGNOSIS — M25552 Pain in left hip: Secondary | ICD-10-CM | POA: Insufficient documentation

## 2023-03-27 DIAGNOSIS — M25551 Pain in right hip: Secondary | ICD-10-CM | POA: Insufficient documentation

## 2023-03-27 DIAGNOSIS — Z9851 Tubal ligation status: Secondary | ICD-10-CM

## 2023-03-27 DIAGNOSIS — M5459 Other low back pain: Secondary | ICD-10-CM | POA: Insufficient documentation

## 2023-03-27 DIAGNOSIS — M6281 Muscle weakness (generalized): Secondary | ICD-10-CM | POA: Diagnosis not present

## 2023-03-27 DIAGNOSIS — M62838 Other muscle spasm: Secondary | ICD-10-CM | POA: Diagnosis not present

## 2023-03-27 NOTE — Progress Notes (Signed)
GYNECOLOGY  VISIT  CC:   post op recheck  HPI: 42 y.o. [id number] [name] here for recheck after undergoing laparoscopic BTL on 01/25/2023.  Reports she is doing well.  No issues.  Bowel and bladder function normal.  Not taking anything for pain.    Baby is doing well.    Pathology reviewed:  Yes .  Questions answered.    MEDS:   Current Outpatient Medications on File Prior to Visit  Medication Sig Dispense Refill   cholecalciferol (VITAMIN D3) 25 MCG (1000 UNIT) tablet Take 1,000 Units by mouth daily.     ibuprofen (ADVIL) 800 MG tablet Take 1 tablet (800 mg total) by mouth every 8 (eight) hours as needed. 30 tablet 0   levothyroxine (SYNTHROID) 100 MCG tablet TAKE 1 TABLET BY MOUTH DAILY BEFORE BREAKFAST. 90 tablet 1   naproxen sodium (ALEVE) 220 MG tablet Take 220 mg by mouth daily as needed.     Prenatal Vit-Fe Fumarate-FA (PRENATAL VITAMIN PLUS LOW IRON) 27-1 MG TABS Take 1 tablet by mouth daily. 30 tablet 11   No current facility-administered medications on file prior to visit.    SH:  Smoking No    PHYSICAL EXAMINATION:    BP 122/82   Pulse 80   Wt 231 lb 12.8 oz (105.1 kg)   LMP 01/23/2023   BMI 41.06 kg/m     General appearance: alert, cooperative and appears stated age Abdomen: soft, non-tender; bowel sounds normal; no masses,  no organomegaly Incisions:  C/D/I   Assessment/Plan: 1. S/P tubal ligation - doing well.  Can resume all activities - last pap smear was 03/08/2022 so due for repeat 2026

## 2023-03-27 NOTE — Therapy (Signed)
OUTPATIENT PHYSICAL THERAPY TREATMENT NOTE   Patient Name: [name] Curry MRN: [id number] DOB:[birth year], 42 y.o., female Today's Date: 03/27/2023  END OF SESSION:  PT End of Session - 03/27/23 1212     Visit Number 10    Date for PT Re-Evaluation 04/09/23    Authorization Type  MEDICAID [facility]    Authorization Time Period 02/08/2023-04/09/2023    Authorization - Visit Number 9    Authorization - Number of Visits 12    PT Start Time 1017    PT Stop Time 1101    PT Time Calculation (min) 44 min    Activity Tolerance Patient tolerated treatment well    Behavior During Therapy [facility] for tasks assessed/performed                     Past Medical History:  Diagnosis Date   Anemia    Anxiety    Arthritis    Beta thalassemia trait    Depression    Gestational diabetes    Graves disease    Hidradenitis    Hypothyroidism 06/30/2016   Palpitations    Pregnancy induced hypertension    Suicidal behavior 08/05/2011   Broke a light bulb and tried to cut her wrists    Past Surgical History:  Procedure Laterality Date   COLPOSCOPY     EXCISIONAL HEMORRHOIDECTOMY  1996   HYDRADENITIS EXCISION Left 12/13/2021   Procedure: EXCISION HIDRADENITIS LEFT AXILLA;  Surgeon: Abigail Miyamoto, MD;  Location: [facility];  Service: General;  Laterality: Left;   LAPAROSCOPIC BILATERAL SALPINGECTOMY Bilateral 02/14/2023   Procedure: LAPAROSCOPIC BILATERAL SALPINGECTOMY;  Surgeon: Jerene Bears, MD;  Location: [facility];  Service: Gynecology;  Laterality: Bilateral;   THYROIDECTOMY N/A 06/30/2016   Procedure: TOTAL THYROIDECTOMY;  Surgeon: Darnell Level, MD;  Location: [facility] OR;  Service: General;  Laterality: N/A;   Patient Active Problem List   Diagnosis Date Noted   Request for sterilization 02/14/2023   BMI 40.0-44.9, adult (HCC) 02/14/2023   Vaginal delivery 09/26/2022   Cervical laceration 09/26/2022   Group beta Strep positive 09/17/2022    History of gestational hypertension 08/11/2022   Penicillin allergy 07/27/2022   Bilateral carpal tunnel syndrome 06/19/2022   Supervision of high risk pregnancy, antepartum 03/08/2022   Muscle spasms of neck 02/06/2022   Beta thalassemia trait 10/07/2021   NSVT (nonsustained ventricular tachycardia) (HCC) 10/07/2021   Pre-diabetes 07/22/2021   Vitamin D deficiency 07/22/2021   Mixed hyperlipidemia 07/22/2021   BMI 36.0-36.9,adult 07/14/2021   Family history of breast cancer in sister 10/22/2019   H/O Graves' disease 09/05/2016   History of thyroidectomy 12/01/2013    PCP: Cyril Mourning, FNP  REFERRING PROVIDER: Cyril Mourning, FNP  REFERRING DIAG: [phone] (ICD-10-CM) - Pain in left hip  M54.50,G89.29 (ICD-10-CM) - Chronic bilateral low back pain without sciatica  M25.551,M25.552,G89.29 (ICD-10-CM) - Chronic pain of both hips  Rationale for Evaluation and Treatment: Rehabilitation  THERAPY DIAG:  Muscle weakness (generalized)  Other muscle spasm  Other low back pain  Bilateral hip pain  ONSET DATE: 3 years ago  SUBJECTIVE:  SUBJECTIVE STATEMENT: Patient reports she is doing okay today. She has been having increased hip pain over the past few days. Currently her hip pain is 5/10.  PERTINENT HISTORY:  S/P laparascopic bilateral salpingectomy on 02/14/2023, Depression, anxiety, OA, Suicidal Behavior 08/05/2011  Messaged Dr. Hyacinth Meeker On 02/20/2023: no heavy lifting >15 pounds for 1 more week (until 02/27/2023) and no abdominal muscle contraction or manual massage to abdomen until 02/27/2023.    PAIN: 03/27/2023 Are you having pain? Yes: NPRS scale: 5/10 Pain location: bilateral lateral/ anterior hips; bilateral lower back Pain description: achy; sharp at times; at times hip gives away  when bending Aggravating factors: Laying on hips; walking up the stairs; sitting more than 20 - 30 minutes; carrying daughter Relieving factors: Heat helps the leg pain but not the hips and back  PRECAUTIONS: Other: no heavy lifting >15 pounds for 1 more week (until 02/27/2023) and no abdominal muscle contraction or manual massage to abdomen until 02/27/2023.     WEIGHT BEARING RESTRICTIONS: No  FALLS:  Has patient fallen in last 6 months? 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 Minimal degenerative arthritis of both hips. Mild sclerotic arthritis of both sacroiliac joints.  PATIENT SURVEYS:  Eval:  Modified Oswestry 30/50; 60% perceived disability   SCREENING FOR RED FLAGS: Bowel or bladder incontinence: Yes: bladder incontinence due to being postpartum  Spinal tumors: No Cauda equina syndrome: No Compression fracture: No Abdominal aneurysm: No  COGNITION: Overall cognitive status: Within functional limits for tasks assessed       POSTURE: rounded shoulders  PALPATION: Increased tenderness and taut muscle bands of Lt piriformis and TFL. Right hip musculature not assessed due to patient's pain while laying on Lt side. Increased tenderness and taut muscle bands of bilateral erector spinae muscles. Decreased mobility and pain L3-L4  LUMBAR ROM:   AROM eval  Flexion To ankles  Extension 30%  Right lateral flexion To joint line   Left lateral flexion To joint line  Right rotation WFL  Left rotation WFL   (Blank rows = not tested)   LOWER EXTREMITY MMT:    MMT Right eval Left eval  Hip flexion 4 4-  Hip extension    Hip abduction 4- 4-  Hip adduction 4 4  Hip internal rotation    Hip external rotation    Knee flexion 4+ 4+  Knee extension 4+ 4+  Ankle dorsiflexion    Ankle plantarflexion    Ankle inversion    Ankle eversion     (Blank rows = not tested)    GAIT:Antalgic gait, decreased step length  TODAY'S TREATMENT:      Date:  03/27/2023 Recumbent bike level 3 x6 min with PT present to discuss status Standing hamstring stretch 2 x 30 sec Standing hip flexor stretch on stair 10 x 5 sec holds Seated piriformis stretch 2 x 30 sec leg Supine hip flexion stretch with green strap 3 x 30 sec each leg  TA contraction + knee  extension x 10 Supine LTR x 10 Seated TA contraction + hip flexion x 10 Seated 3 way SB stretch x 8 each way Seated PPT on stability ball x 20  03/22/2023 Recumbent bike level 3 x6 min with PT present to discuss status Hip matrix (abduction & extension) 25#  2 x 10 Standing hip abduction ball squeeze on wall x 10 each  Monster Walks with blue loop x 4  Monster Walks with blue loop x 4  Sit to stands 5 lb KB 2 x 10 Leg Press bilateral 100#  3 x 10; unilateral 55# 2 x 10 Pallof Press cable column 10# x 20 each direction Chops cable column 5# x 20 each direction Standing march with unilateral KB hold 10# 2 x 10 each [employer] 8# DB x 3 Seated hinging 2 x 10 Standing hinging at wall 2 x 10  03/20/2023 Standing hamstring stretch 2 x 30 sec Standing hip flexor stretch on stair 10 x 5 sec holds Seated piriformis stretch 2 x 30 sec Seated 3 way SB stretch x 8 each way Standing "L" stretch  Standing QL stretch on step 4 x 5 sec each Standing pallof press / red loop 2 x 10 each way Standing chops with red loop 2 x 10 each way [employer] 8# DBs x 4                                                               PATIENT EDUCATION:  Education details: [employer] Person educated: Patient Education method: Programmer, multimedia, Demonstration, Verbal cues, and Handouts Education comprehension: verbalized understanding, returned demonstration, and needs further education  HOME EXERCISE PROGRAM: Access Code: [facility] URL: [email or web address] Date: 02/08/2023 Prepared by: Claude Manges  Exercises - Seated Piriformis Stretch  - 1 x daily - 7 x weekly - 1 sets - 8 reps - 30 hold - Standing ITB Stretch  - 1 x daily - 7 x weekly - 1 sets - 8 reps - 30 hold - Seated Transversus Abdominis Bracing  - 1 x daily - 7 x weekly - 3 sets - 8 reps - 30 hold  ASSESSMENT:  CLINICAL IMPRESSION: Today's treatment session focused on hip mobility and core strengthening. Since starting therapy Andrian verbalized feeling a 75-80% improvement in symptoms. Over the past few days she has been experiencing a little more hip pain compared to normal, but it has not been limiting her functional activities. She presented to therapy today with 5/10 hip pain. She responded well to hip mobility stretching especially hip flexion stretching. Patient required verbal and tactile cues for correct exercise performance. Patient will benefit from skilled PT to address the below impairments and improve overall function.    OBJECTIVE IMPAIRMENTS: Abnormal gait, decreased mobility, decreased ROM, decreased strength, hypomobility, increased muscle spasms, impaired flexibility, improper body mechanics, postural dysfunction, and pain.   ACTIVITY LIMITATIONS: carrying, lifting, bending, sitting, squatting, sleeping, stairs, bed mobility, and continence  PARTICIPATION LIMITATIONS: laundry, driving, shopping, and community activity  PERSONAL FACTORS: 3+ comorbidities: OA, depression, anxiety  are also affecting patient's functional outcome.   REHAB POTENTIAL: Good  CLINICAL DECISION MAKING:  Stable/uncomplicated  EVALUATION COMPLEXITY: Low   GOALS: Goals reviewed with patient? Yes  SHORT TERM GOALS: Target date: 03/08/2023  Patient will demonstrate independence in initial HEP.  Baseline: Goal status: MET  2.  Patient will verbalize 25% improvement in functional ability. Baseline:  Goal status: MET 03/27/2023; pt reports feeling 75-80%   3.  Patient will demonstrate correct body mechanics while completing lifting and carrying tasks. Baseline:  Goal status: Ongoing   LONG TERM GOALS: Target date: 04/05/2023  Patient will demonstrate independence in advanced HEP. Baseline:  Goal status: INITIAL  2.  Patient will verbalize 50% improvement in functional ability. Baseline:  Goal status: MET 03/27/2023; pt reports feeling 75-80%   3.  Patient will improve ODI score to > or = to 20/50. Baseline: 30/50 Goal status: INITIAL  4. Patient will be able to carry her daughter with < or = to 3/10 back pain. Baseline:  Goal status: MET 03/27/2023   PLAN:  PT FREQUENCY: 2x/week  PT DURATION: 8 weeks  PLANNED INTERVENTIONS: Therapeutic exercises, Therapeutic activity, Neuromuscular re-education, Balance training, Gait training, Patient/Family education, Self Care, Joint mobilization, Joint manipulation, Stair training, Vestibular training, Canalith repositioning, Aquatic Therapy, Dry Needling, Electrical stimulation, Spinal manipulation, Spinal mobilization, Cryotherapy, Moist heat, scar mobilization, Taping, Vasopneumatic device, Traction, Ultrasound, Biofeedback, Ionotophoresis 4mg /ml Dexamethasone, and Manual therapy.  PLAN FOR NEXT SESSION: assess pain levels; continue strengthening as tolerated  Claude Manges, PT 03/27/23 12:13 PM [facility] [address] [place], [place] [address] Phone # [phone] Fax [phone]

## 2023-03-29 ENCOUNTER — Encounter: Payer: Self-pay | Admitting: Physical Therapy

## 2023-03-29 ENCOUNTER — Ambulatory Visit: Payer: Medicaid Other | Admitting: Physical Therapy

## 2023-03-29 DIAGNOSIS — E66813 Obesity, class 3: Secondary | ICD-10-CM | POA: Diagnosis not present

## 2023-03-29 DIAGNOSIS — Z6841 Body Mass Index (BMI) 40.0 and over, adult: Secondary | ICD-10-CM | POA: Diagnosis not present

## 2023-03-29 DIAGNOSIS — M6281 Muscle weakness (generalized): Secondary | ICD-10-CM

## 2023-03-29 DIAGNOSIS — M25551 Pain in right hip: Secondary | ICD-10-CM | POA: Diagnosis not present

## 2023-03-29 DIAGNOSIS — M25552 Pain in left hip: Secondary | ICD-10-CM | POA: Diagnosis not present

## 2023-03-29 DIAGNOSIS — M5459 Other low back pain: Secondary | ICD-10-CM

## 2023-03-29 DIAGNOSIS — M62838 Other muscle spasm: Secondary | ICD-10-CM

## 2023-03-29 DIAGNOSIS — R7303 Prediabetes: Secondary | ICD-10-CM | POA: Diagnosis not present

## 2023-03-29 DIAGNOSIS — G8929 Other chronic pain: Secondary | ICD-10-CM | POA: Diagnosis not present

## 2023-03-29 DIAGNOSIS — R030 Elevated blood-pressure reading, without diagnosis of hypertension: Secondary | ICD-10-CM | POA: Diagnosis not present

## 2023-03-29 DIAGNOSIS — M545 Low back pain, unspecified: Secondary | ICD-10-CM | POA: Diagnosis not present

## 2023-03-29 NOTE — Therapy (Signed)
OUTPATIENT PHYSICAL THERAPY TREATMENT NOTE   Patient Name: [name] Curry MRN: [id number] DOB:17-Dec-1980, 42 y.o., female Today's Date: 03/29/2023  END OF SESSION:  PT End of Session - 03/29/23 1105     Visit Number 11    Date for PT Re-Evaluation 04/09/23    Authorization Type [place] MEDICAID [facility]    Authorization Time Period 02/08/2023-04/09/2023    Authorization - Visit Number 10    Authorization - Number of Visits 12    PT Start Time 1017    PT Stop Time 1100    PT Time Calculation (min) 43 min    Activity Tolerance Patient tolerated treatment well    Behavior During Therapy [facility] for tasks assessed/performed                      Past Medical History:  Diagnosis Date   Anemia    Anxiety    Arthritis    Beta thalassemia trait    Depression    Gestational diabetes    Graves disease    Hidradenitis    Hypothyroidism 06/30/2016   Palpitations    Pregnancy induced hypertension    Suicidal behavior 08/05/2011   Broke a light bulb and tried to cut her wrists    Past Surgical History:  Procedure Laterality Date   COLPOSCOPY     EXCISIONAL HEMORRHOIDECTOMY  1996   HYDRADENITIS EXCISION Left 12/13/2021   Procedure: EXCISION HIDRADENITIS LEFT AXILLA;  Surgeon: Abigail Miyamoto, MD;  Location: [facility];  Service: General;  Laterality: Left;   LAPAROSCOPIC BILATERAL SALPINGECTOMY Bilateral 02/14/2023   Procedure: LAPAROSCOPIC BILATERAL SALPINGECTOMY;  Surgeon: Jerene Bears, MD;  Location: [facility];  Service: Gynecology;  Laterality: Bilateral;   THYROIDECTOMY N/A 06/30/2016   Procedure: TOTAL THYROIDECTOMY;  Surgeon: Darnell Level, MD;  Location: [facility] OR;  Service: General;  Laterality: N/A;   Patient Active Problem List   Diagnosis Date Noted   Request for sterilization 02/14/2023   BMI 40.0-44.9, adult (HCC) 02/14/2023   Vaginal delivery 09/26/2022   Cervical laceration 09/26/2022   Group beta Strep positive 09/17/2022    History of gestational hypertension 08/11/2022   Penicillin allergy 07/27/2022   Bilateral carpal tunnel syndrome 06/19/2022   Supervision of high risk pregnancy, antepartum 03/08/2022   Muscle spasms of neck 02/06/2022   Beta thalassemia trait 10/07/2021   NSVT (nonsustained ventricular tachycardia) (HCC) 10/07/2021   Pre-diabetes 07/22/2021   Vitamin D deficiency 07/22/2021   Mixed hyperlipidemia 07/22/2021   BMI 36.0-36.9,adult 07/14/2021   Family history of breast cancer in sister 10/22/2019   H/O Graves' disease 09/05/2016   History of thyroidectomy 12/01/2013    PCP: Cyril Mourning, FNP  REFERRING PROVIDER: Cyril Mourning, FNP  REFERRING DIAG: [phone] (ICD-10-CM) - Pain in left hip  M54.50,G89.29 (ICD-10-CM) - Chronic bilateral low back pain without sciatica  M25.551,M25.552,G89.29 (ICD-10-CM) - Chronic pain of both hips  Rationale for Evaluation and Treatment: Rehabilitation  THERAPY DIAG:  Muscle weakness (generalized)  Other muscle spasm  Other low back pain  Bilateral hip pain  ONSET DATE: 3 years ago  SUBJECTIVE:  SUBJECTIVE STATEMENT: Patient reports she is doing a lot better today compared to last treatment session. She is not currently having any pain.  PERTINENT HISTORY:  S/P laparascopic bilateral salpingectomy on 02/14/2023, Depression, anxiety, OA, Suicidal Behavior 08/05/2011  Messaged Dr. Hyacinth Meeker On 02/20/2023: no heavy lifting >15 pounds for 1 more week (until 02/27/2023) and no abdominal muscle contraction or manual massage to abdomen until 02/27/2023.    PAIN: 03/27/2023 Are you having pain? Yes: NPRS scale: 5/10 Pain location: bilateral lateral/ anterior hips; bilateral lower back Pain description: achy; sharp at times; at times hip gives away when  bending Aggravating factors: Laying on hips; walking up the stairs; sitting more than 20 - 30 minutes; carrying daughter Relieving factors: Heat helps the leg pain but not the hips and back  PRECAUTIONS: Other: no heavy lifting >15 pounds for 1 more week (until 02/27/2023) and no abdominal muscle contraction or manual massage to abdomen until 02/27/2023.     WEIGHT BEARING RESTRICTIONS: No  FALLS:  Has patient fallen in last 6 months? 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 Minimal degenerative arthritis of both hips. Mild sclerotic arthritis of both sacroiliac joints.  PATIENT SURVEYS:  Eval:  Modified Oswestry 30/50; 60% perceived disability   SCREENING FOR RED FLAGS: Bowel or bladder incontinence: Yes: bladder incontinence due to being postpartum  Spinal tumors: No Cauda equina syndrome: No Compression fracture: No Abdominal aneurysm: No  COGNITION: Overall cognitive status: Within functional limits for tasks assessed       POSTURE: rounded shoulders  PALPATION: Increased tenderness and taut muscle bands of Lt piriformis and TFL. Right hip musculature not assessed due to patient's pain while laying on Lt side. Increased tenderness and taut muscle bands of bilateral erector spinae muscles. Decreased mobility and pain L3-L4  LUMBAR ROM:   AROM eval  Flexion To ankles  Extension 30%  Right lateral flexion To joint line  Left  lateral flexion To joint line  Right rotation WFL  Left rotation WFL   (Blank rows = not tested)   LOWER EXTREMITY MMT:    MMT Right eval Left eval  Hip flexion 4 4-  Hip extension    Hip abduction 4- 4-  Hip adduction 4 4  Hip internal rotation    Hip external rotation    Knee flexion 4+ 4+  Knee extension 4+ 4+  Ankle dorsiflexion    Ankle plantarflexion    Ankle inversion    Ankle eversion     (Blank rows = not tested)    GAIT:Antalgic gait, decreased step length  TODAY'S TREATMENT:      Date:  03/29/2023 Recumbent bike level 3 x 6 min with PT present to discuss status Standing hamstring stretch 2 x 30 sec Standing hip flexor stretch on stair 10 x 5 sec holds Supine hip flexion stretch with green strap 3 x 30 sec each leg  TA contraction + knee extension x 10 Seated TA contraction + hip flexion x 10  Bridging 2 x 10 Trigger Point Dry-Needling performed by Lorrene Reid Treatment instructions: Expect mild to moderate muscle soreness. S/S of pneumothorax if dry needled over a lung field, and to seek immediate medical attention should they occur. Patient verbalized understanding of these instructions and education.  Patient Consent Given: Yes Education handout provided: Previously provided Muscles treated: bilateral lumbar multifidi; bilateral piriformis, glute med & min Electrical stimulation performed: No Parameters: N/A Treatment response/outcome: Utilized skilled palpation to identify trigger points.  During dry needling able to palpate muscle twitch and muscle elongation    03/27/2023 Recumbent bike level 3 x6 min with PT present to discuss status Standing hamstring stretch 2 x 30 sec Standing hip flexor stretch on stair 10 x 5 sec holds Seated piriformis stretch 2 x 30 sec leg Supine hip flexion stretch with green strap 3 x 30 sec each leg  TA contraction + knee extension x 10 Supine LTR x 10 Seated TA contraction + hip flexion x 10 Seated 3 way SB stretch  x 8 each way Seated PPT on stability ball x 20  03/22/2023 Recumbent bike level 3 x6 min with PT present to discuss status Hip matrix (abduction & extension) 25#  2 x 10 Standing hip abduction ball squeeze on wall x 10 each  Monster Walks with blue loop x 4  Monster Walks with blue loop x 4  Sit to stands 5 lb KB 2 x 10 Leg Press bilateral 100#  3 x 10; unilateral 55# 2 x 10 Pallof Press cable column 10# x 20 each direction Chops cable column 5# x 20 each direction Standing march with unilateral KB hold 10# 2 x 10 each [employer] 8# DB x 3 Seated hinging 2 x 10 Standing hinging at wall 2 x 10                                                             PATIENT EDUCATION:  Education details: [employer] Person educated: Patient Education method: Programmer, multimedia, Demonstration, Verbal cues, and Handouts Education comprehension: verbalized understanding, returned demonstration, and needs further education  HOME EXERCISE PROGRAM: Access Code: [facility] URL: [email or web address] Date: 02/08/2023 Prepared by: Claude Manges  Exercises - Seated Piriformis Stretch  - 1 x daily - 7 x weekly - 1 sets - 8 reps - 30 hold - Standing ITB Stretch  - 1 x daily - 7 x weekly - 1 sets - 8 reps - 30 hold - Seated Transversus Abdominis Bracing  - 1 x daily - 7 x weekly - 3 sets - 8 reps - 30 hold  ASSESSMENT:  CLINICAL IMPRESSION: Today's treatment session focused on hip mobility and general strengthening. Patient verbalize a decrease in hip pain since last treatment session. She tolerated treatment session well and did not verbalize any increased hip pain. Incorporated more hip stability exercises this treatment session. Patient required verbal and tactile cues for correct exercise performance. Patient responded favorably to dry needling and verbalize immediate relief.  During dry needling able to palpate muscle twitch and muscle elongation. Patient will benefit from skilled PT to address  the below impairments and improve overall function.     OBJECTIVE IMPAIRMENTS: Abnormal gait, decreased mobility, decreased ROM, decreased strength, hypomobility, increased muscle spasms, impaired flexibility, improper body mechanics, postural dysfunction, and pain.  ACTIVITY LIMITATIONS: carrying, lifting, bending, sitting, squatting, sleeping, stairs, bed mobility, and continence  PARTICIPATION LIMITATIONS: laundry, driving, shopping, and community activity  PERSONAL FACTORS: 3+ comorbidities: OA, depression, anxiety  are also affecting patient's functional outcome.   REHAB POTENTIAL: Good  CLINICAL DECISION MAKING: Stable/uncomplicated  EVALUATION COMPLEXITY: Low   GOALS: Goals reviewed with patient? Yes  SHORT TERM GOALS: Target date: 03/08/2023  Patient will demonstrate independence in initial HEP. Baseline: Goal status: MET  2.  Patient will verbalize 25% improvement in functional ability. Baseline:  Goal status: MET 03/27/2023; pt reports feeling 75-80%   3.  Patient will demonstrate correct body mechanics while completing lifting and carrying tasks. Baseline:  Goal status: Ongoing   LONG TERM GOALS: Target date: 04/05/2023  Patient will demonstrate independence in advanced HEP. Baseline:  Goal status: INITIAL  2.  Patient will verbalize 50% improvement in functional ability. Baseline:  Goal status: MET 03/27/2023; pt reports feeling 75-80%   3.  Patient will improve ODI score to > or = to 20/50. Baseline: 30/50 Goal status: INITIAL  4. Patient will be able to carry her daughter with < or = to 3/10 back pain. Baseline:  Goal status: MET 03/27/2023   PLAN:  PT FREQUENCY: 2x/week  PT DURATION: 8 weeks  PLANNED INTERVENTIONS: Therapeutic exercises, Therapeutic activity, Neuromuscular re-education, Balance training, Gait training, Patient/Family education, Self Care, Joint mobilization, Joint manipulation, Stair training, Vestibular training, Canalith  repositioning, Aquatic Therapy, Dry Needling, Electrical stimulation, Spinal manipulation, Spinal mobilization, Cryotherapy, Moist heat, scar mobilization, Taping, Vasopneumatic device, Traction, Ultrasound, Biofeedback, Ionotophoresis 4mg /ml Dexamethasone, and Manual therapy.  PLAN FOR NEXT SESSION: assess response to DN; assess pain levels; continue hip strengthening as tolerated  Claude Manges, PT Lorrene Reid, PT  03/29/23 11:05 AM [facility] [address] [place], [place] [address] Phone # [phone] Fax [phone]

## 2023-03-30 ENCOUNTER — Encounter (HOSPITAL_BASED_OUTPATIENT_CLINIC_OR_DEPARTMENT_OTHER): Payer: Self-pay | Admitting: Obstetrics & Gynecology

## 2023-04-03 ENCOUNTER — Other Ambulatory Visit (HOSPITAL_BASED_OUTPATIENT_CLINIC_OR_DEPARTMENT_OTHER): Payer: Self-pay | Admitting: Obstetrics & Gynecology

## 2023-04-03 ENCOUNTER — Ambulatory Visit: Payer: Medicaid Other | Admitting: Physical Therapy

## 2023-04-03 DIAGNOSIS — M6289 Other specified disorders of muscle: Secondary | ICD-10-CM

## 2023-04-05 ENCOUNTER — Encounter: Payer: Self-pay | Admitting: Physical Therapy

## 2023-04-05 ENCOUNTER — Ambulatory Visit: Payer: Medicaid Other | Admitting: Physical Therapy

## 2023-04-05 DIAGNOSIS — M5459 Other low back pain: Secondary | ICD-10-CM

## 2023-04-05 DIAGNOSIS — M25551 Pain in right hip: Secondary | ICD-10-CM | POA: Diagnosis not present

## 2023-04-05 DIAGNOSIS — M62838 Other muscle spasm: Secondary | ICD-10-CM | POA: Diagnosis not present

## 2023-04-05 DIAGNOSIS — M6281 Muscle weakness (generalized): Secondary | ICD-10-CM | POA: Diagnosis not present

## 2023-04-05 DIAGNOSIS — M25552 Pain in left hip: Secondary | ICD-10-CM | POA: Diagnosis not present

## 2023-04-05 NOTE — Therapy (Signed)
OUTPATIENT PHYSICAL THERAPY TREATMENT NOTE/ DISCHARGE NOTE   Patient Name: [name] Curry MRN: [id number] DOB:1981-03-24, 42 y.o., female Today's Date: 04/05/2023  END OF SESSION:  PT End of Session - 04/05/23 1715     Visit Number 12    Date for PT Re-Evaluation 04/09/23    Authorization Type [place] MEDICAID [facility]    Authorization Time Period 02/08/2023-04/09/2023    Authorization - Visit Number 11    Authorization - Number of Visits 12    PT Start Time 1018    PT Stop Time 1103    PT Time Calculation (min) 45 min    Activity Tolerance Patient tolerated treatment well    Behavior During Therapy [facility] for tasks assessed/performed                       Past Medical History:  Diagnosis Date   Anemia    Anxiety    Arthritis    Beta thalassemia trait    Depression    Gestational diabetes    Graves disease    Hidradenitis    Hypothyroidism 06/30/2016   Palpitations    Pregnancy induced hypertension    Suicidal behavior 08/05/2011   Broke a light bulb and tried to cut her wrists    Past Surgical History:  Procedure Laterality Date   COLPOSCOPY     EXCISIONAL HEMORRHOIDECTOMY  1996   HYDRADENITIS EXCISION Left 12/13/2021   Procedure: EXCISION HIDRADENITIS LEFT AXILLA;  Surgeon: Abigail Miyamoto, MD;  Location: [facility];  Service: General;  Laterality: Left;   LAPAROSCOPIC BILATERAL SALPINGECTOMY Bilateral 02/14/2023   Procedure: LAPAROSCOPIC BILATERAL SALPINGECTOMY;  Surgeon: Jerene Bears, MD;  Location: [facility];  Service: Gynecology;  Laterality: Bilateral;   THYROIDECTOMY N/A 06/30/2016   Procedure: TOTAL THYROIDECTOMY;  Surgeon: Darnell Level, MD;  Location: [facility];  Service: General;  Laterality: N/A;   Patient Active Problem List   Diagnosis Date Noted   Penicillin allergy 07/27/2022   Bilateral carpal tunnel syndrome 06/19/2022   Muscle spasms of neck 02/06/2022   Beta thalassemia trait 10/07/2021   NSVT  (nonsustained ventricular tachycardia) (HCC) 10/07/2021   Pre-diabetes 07/22/2021   Vitamin D deficiency 07/22/2021   Mixed hyperlipidemia 07/22/2021   BMI 36.0-36.9,adult 07/14/2021   Family history of breast cancer in sister 10/22/2019   H/O Graves' disease 09/05/2016   History of thyroidectomy 12/01/2013    PCP: Cyril Mourning, FNP  REFERRING PROVIDER: Cyril Mourning, FNP  REFERRING DIAG: [phone] (ICD-10-CM) - Pain in left hip  M54.50,G89.29 (ICD-10-CM) - Chronic bilateral low back pain without sciatica  M25.551,M25.552,G89.29 (ICD-10-CM) - Chronic pain of both hips  Rationale for Evaluation and Treatment: Rehabilitation  THERAPY DIAG:  Muscle weakness (generalized)  Other muscle spasm  Other low back pain  Bilateral hip pain  ONSET DATE: 3 years ago  SUBJECTIVE:  SUBJECTIVE STATEMENT: Patient reports she is doing good today. She felt like a new person after dry needling. She currently have 2/10 pain.  PERTINENT HISTORY:  S/P laparascopic bilateral salpingectomy on 02/14/2023, Depression, anxiety, OA, Suicidal Behavior 08/05/2011  Messaged Dr. Hyacinth Meeker On 02/20/2023: no heavy lifting >15 pounds for 1 more week (until 02/27/2023) and no abdominal muscle contraction or manual massage to abdomen until 02/27/2023.    PAIN: 04/05/2023 Are you having pain? Yes: NPRS scale: 2/10 Pain location: bilateral lateral/ anterior hips; bilateral lower back Pain description: achy; sharp at times; at times hip gives away when bending Aggravating factors: Laying on hips; walking up the stairs; sitting more than 20 - 30 minutes; carrying daughter Relieving factors: Heat helps the leg pain but not the hips and back  PRECAUTIONS: Other: no heavy lifting >15 pounds for 1 more week (until 02/27/2023) and  no abdominal muscle contraction or manual massage to abdomen until 02/27/2023.     WEIGHT BEARING RESTRICTIONS: No  FALLS:  Has patient fallen in last 6 months? 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 Minimal degenerative arthritis of both hips. Mild sclerotic arthritis of both sacroiliac joints.  PATIENT SURVEYS:  Eval:  Modified Oswestry 30/50; 60% perceived disability   SCREENING FOR RED FLAGS: Bowel or bladder incontinence: Yes: bladder incontinence due to being postpartum  Spinal tumors: No Cauda equina syndrome: No Compression fracture: No Abdominal aneurysm: No  COGNITION: Overall cognitive status: Within functional limits for tasks assessed       POSTURE: rounded shoulders  PALPATION: Increased tenderness and taut muscle bands of Lt piriformis and TFL. Right hip musculature not assessed due to patient's pain while laying on Lt side. Increased tenderness and taut muscle bands of bilateral erector spinae muscles. Decreased mobility and pain L3-L4  LUMBAR ROM:   AROM eval  Flexion To ankles  Extension 30%  Right lateral flexion To joint line  Left lateral flexion To joint line  Right rotation WFL  Left rotation WFL   (Blank rows = not tested)   LOWER EXTREMITY MMT:    MMT Right eval Left eval  Hip flexion 4 4-  Hip extension    Hip abduction 4- 4-  Hip adduction 4 4  Hip internal rotation    Hip external  rotation    Knee flexion 4+ 4+  Knee extension 4+ 4+  Ankle dorsiflexion    Ankle plantarflexion    Ankle inversion    Ankle eversion     (Blank rows = not tested)    GAIT:Antalgic gait, decreased step length  TODAY'S TREATMENT:      Date:  04/05/2023 Recumbent bike level 3 x 6 min with PT present to discuss status Hip matrix (abduction & extension) 25#  2 x 10 Monster Walks with yellow loop x 4  Seated trunk extension hold 10 lb KB 2 x 10 Standing March Sit to stands 5 lb KB 2 x 10 Leg Press bilateral 100# ; 2 x 10  unilateral (100# on Rt); 50# on L 2 x 10 Education about pelvic floor exercises to complete   03/29/2023 Recumbent bike level 3 x 6 min with PT present to discuss status Standing hamstring stretch 2 x 30 sec Standing hip flexor stretch on stair 10 x 5 sec holds Supine hip flexion stretch with green strap 3 x 30 sec each leg  TA contraction + knee extension x 10 Seated TA contraction + hip flexion x 10 Bridging 2 x 10 Trigger Point Dry-Needling performed by Lorrene Reid Treatment instructions: Expect mild to moderate muscle soreness. S/S of pneumothorax if dry needled over a lung field, and to seek immediate medical attention should they occur. Patient verbalized understanding of these instructions and education.  Patient Consent Given: Yes Education handout provided: Previously provided Muscles treated: bilateral lumbar multifidi; bilateral piriformis, glute med & min Electrical stimulation performed: No Parameters: N/A Treatment response/outcome: Utilized skilled palpation to identify trigger points.  During dry needling able to palpate muscle twitch and muscle elongation    03/27/2023 Recumbent bike level 3 x6 min with PT present to discuss status Standing hamstring stretch 2 x 30 sec Standing hip flexor stretch on stair 10 x 5 sec holds Seated piriformis stretch 2 x 30 sec leg Supine hip flexion stretch with green strap 3 x 30 sec each leg  TA  contraction + knee extension x 10 Supine LTR x 10 Seated TA contraction + hip flexion x 10 Seated 3 way SB stretch x 8 each way Seated PPT on stability ball x 20  03/22/2023 Recumbent bike level 3 x6 min with PT present to discuss status Hip matrix (abduction & extension) 25#  2 x 10 Standing hip abduction ball squeeze on wall x 10 each  Monster Walks with blue loop x 4  Monster Walks with blue loop x 4  Sit to stands 5 lb KB 2 x 10 Leg Press bilateral 100#  3 x 10; unilateral 55# 2 x 10 Pallof Press cable column 10# x 20 each direction Chops cable column 5# x 20 each direction Standing march with unilateral KB hold 10# 2 x 10 each Farmer's Carries 8# DB x 3 Seated hinging 2 x 10 Standing hinging at wall 2 x 10                                                             PATIENT EDUCATION:  Education details: [employer] Person educated: Patient Education method: Programmer, multimedia, Demonstration, Verbal cues, and Handouts Education comprehension: verbalized understanding, returned demonstration, and needs further education  HOME EXERCISE PROGRAM: Access Code: [facility] URL: [email or web address] Date: 02/08/2023 Prepared by: Claude Manges  Exercises - Seated Piriformis Stretch  - 1 x daily - 7 x weekly - 1 sets - 8 reps - 30 hold - Standing ITB Stretch  - 1 x daily - 7 x weekly - 1 sets - 8 reps - 30 hold - Seated Transversus Abdominis Bracing  - 1 x daily - 7 x weekly - 3 sets - 8 reps - 30 hold  ASSESSMENT:  CLINICAL IMPRESSION: Today's treatment session focused on hip strengthening. Patient verbalized that her back pain is 80-90% better and her hips are 30% better since starting therapy. She feels her hip pain is more related to her pelvic floor pain after  birth. She has an appointment scheduled with a pelvic floor therapist in January. Pelvic floor therapist educated patient on positions to try to alleviate pelvic pain. Patient is comfortable with progress made with  physical therapy and is ready to discharge. Patient has met all goals at this time. Patient to discharge with HEP, and begin pelvic floor therapy in January.     OBJECTIVE IMPAIRMENTS: Abnormal gait, decreased mobility, decreased ROM, decreased strength, hypomobility, increased muscle spasms, impaired flexibility, improper body mechanics, postural dysfunction, and pain.   ACTIVITY LIMITATIONS: carrying, lifting, bending, sitting, squatting, sleeping, stairs, bed mobility, and continence  PARTICIPATION LIMITATIONS: laundry, driving, shopping, and community activity  PERSONAL FACTORS: 3+ comorbidities: OA, depression, anxiety  are also affecting patient's functional outcome.   REHAB POTENTIAL: Good  CLINICAL DECISION MAKING: Stable/uncomplicated  EVALUATION COMPLEXITY: Low   GOALS: Goals reviewed with patient? Yes  SHORT TERM GOALS: Target date: 03/08/2023  Patient will demonstrate independence in initial HEP. Baseline: Goal status: MET  2.  Patient will verbalize 25% improvement in functional ability. Baseline:  Goal status: MET 03/27/2023; pt reports feeling 75-80%   3.  Patient will demonstrate correct body mechanics while completing lifting and carrying tasks. Baseline:  Goal status: MET 04/05/2023   LONG TERM GOALS: Target date: 04/05/2023  Patient will demonstrate independence in advanced HEP. Baseline:  Goal status: MET 04/05/2023  2.  Patient will verbalize 50% improvement in functional ability. Baseline:  Goal status: MET 03/27/2023; pt reports feeling 75-80%   3.  Patient will improve ODI score to > or = to 20/50. Baseline: 30/50 Goal status: MET 04/05/2023   4. Patient will be able to carry her daughter with < or = to 3/10 back pain. Baseline:  Goal status: MET 03/27/2023   PLAN:  PT FREQUENCY: 2x/week  PT DURATION: 8 weeks  PLANNED INTERVENTIONS: Therapeutic exercises, Therapeutic activity, Neuromuscular re-education, Balance training, Gait  training, Patient/Family education, Self Care, Joint mobilization, Joint manipulation, Stair training, Vestibular training, Canalith repositioning, Aquatic Therapy, Dry Needling, Electrical stimulation, Spinal manipulation, Spinal mobilization, Cryotherapy, Moist heat, scar mobilization, Taping, Vasopneumatic device, Traction, Ultrasound, Biofeedback, Ionotophoresis 4mg /ml Dexamethasone, and Manual therapy.  PLAN FOR NEXT SESSION: d/c patient with HEP   PHYSICAL THERAPY DISCHARGE SUMMARY  Visits from Start of Care: 11  Current functional level related to goals / functional outcomes: Patient has met all goals at this time. She will begin pelvic floor therapy in January   Remaining deficits: See above   Education / Equipment: See above   Patient agrees to discharge. Patient goals were met. Patient is being discharged due to meeting the stated rehab goals.  Claude Manges, PT 04/05/23 5:15 PM [facility] [address], [address] [place], [place] [address] Phone # [phone] Fax [phone]

## 2023-04-05 NOTE — Therapy (Signed)
OUTPATIENT PHYSICAL THERAPY TREATMENT NOTE   Patient Name: [name] Curry MRN: [id number] DOB:[birth year], 42 y.o., female Today's Date: 04/05/2023  END OF SESSION:             Past Medical History:  Diagnosis Date   Anemia    Anxiety    Arthritis    Beta thalassemia trait    Depression    Gestational diabetes    Graves disease    Hidradenitis    Hypothyroidism 06/30/2016   Palpitations    Pregnancy induced hypertension    Suicidal behavior 08/05/2011   Broke a light bulb and tried to cut her wrists    Past Surgical History:  Procedure Laterality Date   COLPOSCOPY     EXCISIONAL HEMORRHOIDECTOMY  1996   HYDRADENITIS EXCISION Left 12/13/2021   Procedure: EXCISION HIDRADENITIS LEFT AXILLA;  Surgeon: Abigail Miyamoto, MD;  Location: [facility];  Service: General;  Laterality: Left;   LAPAROSCOPIC BILATERAL SALPINGECTOMY Bilateral 02/14/2023   Procedure: LAPAROSCOPIC BILATERAL SALPINGECTOMY;  Surgeon: Jerene Bears, MD;  Location: [facility];  Service: Gynecology;  Laterality: Bilateral;   THYROIDECTOMY N/A 06/30/2016   Procedure: TOTAL THYROIDECTOMY;  Surgeon: Darnell Level, MD;  Location: [facility];  Service: General;  Laterality: N/A;   Patient Active Problem List   Diagnosis Date Noted   Penicillin allergy 07/27/2022   Bilateral carpal tunnel syndrome 06/19/2022   Muscle spasms of neck 02/06/2022   Beta thalassemia trait 10/07/2021   NSVT (nonsustained ventricular tachycardia) (HCC) 10/07/2021   Pre-diabetes 07/22/2021   Vitamin D deficiency 07/22/2021   Mixed hyperlipidemia 07/22/2021   BMI 36.0-36.9,adult 07/14/2021   Family history of breast cancer in sister 10/22/2019   H/O Graves' disease 09/05/2016   History of thyroidectomy 12/01/2013    PCP: Cyril Mourning, FNP  REFERRING PROVIDER: Cyril Mourning, FNP  REFERRING DIAG: [phone] (ICD-10-CM) - Pain in left hip  M54.50,G89.29 (ICD-10-CM) - Chronic bilateral low  back pain without sciatica  M25.551,M25.552,G89.29 (ICD-10-CM) - Chronic pain of both hips  Rationale for Evaluation and Treatment: Rehabilitation  THERAPY DIAG:  Muscle weakness (generalized)  Other muscle spasm  Other low back pain  Bilateral hip pain  ONSET DATE: 3 years ago  SUBJECTIVE:                                                                                                                                                                                           SUBJECTIVE STATEMENT: Patient reports she is doing good today. She felt like a new person after dry needling. She currently have 2/10 pain.  PERTINENT HISTORY:  S/P laparascopic bilateral  salpingectomy on 02/14/2023, Depression, anxiety, OA, Suicidal Behavior 08/05/2011  Messaged Dr. Hyacinth Meeker On 02/20/2023: no heavy lifting >15 pounds for 1 more week (until 02/27/2023) and no abdominal muscle contraction or manual massage to abdomen until 02/27/2023.    PAIN: 04/05/2023 Are you having pain? Yes: NPRS scale: 2/10 Pain location: bilateral lateral/ anterior hips; bilateral lower back Pain description: achy; sharp at times; at times hip gives away when bending Aggravating factors: Laying on hips; walking up the stairs; sitting more than 20 - 30 minutes; carrying daughter Relieving factors: Heat helps the leg pain but not the hips and back  PRECAUTIONS: Other: no heavy lifting >15 pounds for 1 more week (until 02/27/2023) and no abdominal muscle contraction or manual massage to abdomen until 02/27/2023.     WEIGHT BEARING RESTRICTIONS: No  FALLS:  Has patient fallen in last 6 months? 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 Minimal degenerative arthritis of both hips. Mild sclerotic arthritis of both sacroiliac joints.  PATIENT SURVEYS:  Eval:  Modified Oswestry 30/50; 60% perceived disability   SCREENING FOR RED FLAGS: Bowel or bladder incontinence: Yes: bladder incontinence due to being postpartum  Spinal tumors: No Cauda equina syndrome: No Compression fracture: No Abdominal aneurysm: No  COGNITION: Overall cognitive status: Within functional limits for tasks assessed       POSTURE: rounded shoulders  PALPATION: Increased tenderness and taut muscle bands of Lt piriformis and TFL. Right hip musculature not assessed due to patient's pain while laying on Lt side. Increased tenderness and taut muscle bands of bilateral erector spinae muscles. Decreased mobility and pain L3-L4  LUMBAR ROM:   AROM eval  Flexion To ankles  Extension 30%  Right lateral flexion To joint line  Left lateral flexion To joint line  Right rotation WFL  Left rotation WFL   (Blank rows = not tested)   LOWER EXTREMITY MMT:    MMT Right eval Left eval  Hip flexion 4 4-  Hip extension    Hip abduction 4- 4-  Hip adduction 4 4  Hip internal rotation    Hip external rotation    Knee flexion 4+ 4+  Knee extension 4+ 4+  Ankle dorsiflexion    Ankle plantarflexion    Ankle inversion    Ankle eversion     (Blank rows = not tested)    GAIT:Antalgic gait, decreased step length  TODAY'S TREATMENT:      Date:  04/05/2023 Recumbent bike level 3 x 6 min with PT present to discuss status Hip matrix (abduction & extension) 25#  2 x 10 Monster Walks with yellow loop x 4  Seated trunk extension hold 10 lb KB 2 x 10 Standing March Sit to stands 5 lb KB 2 x  10 Leg Press bilateral 100# ; 2 x 10 unilateral (100# on Rt); 50# on L 2 x 10 Education of pelvic floor exercise to do   03/29/2023 Recumbent bike level 3 x 6 min with PT present  to discuss status Standing hamstring stretch 2 x 30 sec Standing hip flexor stretch on stair 10 x 5 sec holds Supine hip flexion stretch with green strap 3 x 30 sec each leg  TA contraction + knee extension x 10 Seated TA contraction + hip flexion x 10 Bridging 2 x 10 Trigger Point Dry-Needling performed by Lorrene Reid Treatment instructions: Expect mild to moderate muscle soreness. S/S of pneumothorax if dry needled over a lung field, and to seek immediate medical attention should they occur. Patient verbalized understanding of these instructions and education.  Patient Consent Given: Yes Education handout provided: Previously provided Muscles treated: bilateral lumbar multifidi; bilateral piriformis, glute med & min Electrical stimulation performed: No Parameters: N/A Treatment response/outcome: Utilized skilled palpation to identify trigger points.  During dry needling able to palpate muscle twitch and muscle elongation    03/27/2023 Recumbent bike level 3 x6 min with PT present to discuss status Standing hamstring stretch 2 x 30 sec Standing hip flexor stretch on stair 10 x 5 sec holds Seated piriformis stretch 2 x 30 sec leg Supine hip flexion stretch with green strap 3 x 30 sec each leg  TA contraction + knee extension x 10 Supine LTR x 10 Seated TA contraction + hip flexion x 10 Seated 3 way SB stretch x 8 each way Seated PPT on stability ball x 20  03/22/2023 Recumbent bike level 3 x6 min with PT present to discuss status Hip matrix (abduction & extension) 25#  2 x 10 Standing hip abduction ball squeeze on wall x 10 each  Monster Walks with blue loop x 4  Monster Walks with blue loop x 4  Sit to stands 5 lb KB 2 x 10 Leg Press bilateral 100#  3 x 10; unilateral 55# 2 x 10 Pallof Press cable  column 10# x 20 each direction Chops cable column 5# x 20 each direction Standing march with unilateral KB hold 10# 2 x 10 each Farmer's Carries 8# DB x 3 Seated hinging 2 x 10 Standing hinging at wall 2 x 10                                                             PATIENT EDUCATION:  Education details: [employer] Person educated: Patient Education method: Programmer, multimedia, Demonstration, Verbal cues, and Handouts Education comprehension: verbalized understanding, returned demonstration, and needs further education  HOME EXERCISE PROGRAM: Access Code: [facility] URL: [email or web address] Date: 02/08/2023 Prepared by: Claude Manges  Exercises - Seated Piriformis Stretch  - 1 x daily - 7 x weekly - 1 sets - 8 reps - 30 hold - Standing ITB Stretch  - 1 x daily - 7 x weekly - 1 sets - 8 reps - 30 hold - Seated Transversus Abdominis Bracing  - 1 x daily - 7 x weekly - 3 sets - 8 reps - 30 hold  ASSESSMENT:  CLINICAL IMPRESSION: Today's treatment session focused on hip strengthening. Patient verbalized that her back pain is 80-90% better and her hips are 30% better since starting therapy. With increased activity she notices increased hip pain. During treatment session patient    OBJECTIVE IMPAIRMENTS: Abnormal gait, decreased mobility, decreased ROM, decreased strength, hypomobility, increased muscle spasms, impaired flexibility, improper body mechanics, postural dysfunction, and pain.   ACTIVITY LIMITATIONS: carrying, lifting,  bending, sitting, squatting, sleeping, stairs, bed mobility, and continence  PARTICIPATION LIMITATIONS: laundry, driving, shopping, and community activity  PERSONAL FACTORS: 3+ comorbidities: OA, depression, anxiety  are also affecting patient's functional outcome.   REHAB POTENTIAL: Good  CLINICAL DECISION MAKING: Stable/uncomplicated  EVALUATION COMPLEXITY: Low   GOALS: Goals reviewed with patient? Yes  SHORT TERM GOALS: Target date:  03/08/2023  Patient will demonstrate independence in initial HEP. Baseline: Goal status: MET  2.  Patient will verbalize 25% improvement in functional ability. Baseline:  Goal status: MET 03/27/2023; pt reports feeling 75-80%   3.  Patient will demonstrate correct body mechanics while completing lifting and carrying tasks. Baseline:  Goal status: Ongoing   LONG TERM GOALS: Target date: 04/05/2023  Patient will demonstrate independence in advanced HEP. Baseline:  Goal status: INITIAL  2.  Patient will verbalize 50% improvement in functional ability. Baseline:  Goal status: MET 03/27/2023; pt reports feeling 75-80%   3.  Patient will improve ODI score to > or = to 20/50. Baseline: 30/50 Goal status: INITIAL  4. Patient will be able to carry her daughter with < or = to 3/10 back pain. Baseline:  Goal status: MET 03/27/2023   PLAN:  PT FREQUENCY: 2x/week  PT DURATION: 8 weeks  PLANNED INTERVENTIONS: Therapeutic exercises, Therapeutic activity, Neuromuscular re-education, Balance training, Gait training, Patient/Family education, Self Care, Joint mobilization, Joint manipulation, Stair training, Vestibular training, Canalith repositioning, Aquatic Therapy, Dry Needling, Electrical stimulation, Spinal manipulation, Spinal mobilization, Cryotherapy, Moist heat, scar mobilization, Taping, Vasopneumatic device, Traction, Ultrasound, Biofeedback, Ionotophoresis 4mg /ml Dexamethasone, and Manual therapy.  PLAN FOR NEXT SESSION: assess response to DN; assess pain levels; continue hip strengthening as tolerated  Claude Manges, PT Lorrene Reid, PT  04/05/23 10:18 AM [facility] [address] [place], [place] [address] Phone # [phone] Fax [phone]

## 2023-04-12 DIAGNOSIS — Z0120 Encounter for dental examination and cleaning without abnormal findings: Secondary | ICD-10-CM | POA: Diagnosis not present

## 2023-04-18 ENCOUNTER — Other Ambulatory Visit: Payer: Medicaid Other

## 2023-04-22 DIAGNOSIS — Z419 Encounter for procedure for purposes other than remedying health state, unspecified: Secondary | ICD-10-CM | POA: Diagnosis not present

## 2023-04-23 ENCOUNTER — Other Ambulatory Visit: Payer: Self-pay

## 2023-04-23 ENCOUNTER — Encounter (HOSPITAL_BASED_OUTPATIENT_CLINIC_OR_DEPARTMENT_OTHER): Payer: Self-pay

## 2023-04-23 ENCOUNTER — Encounter (HOSPITAL_BASED_OUTPATIENT_CLINIC_OR_DEPARTMENT_OTHER): Payer: Self-pay | Admitting: Obstetrics & Gynecology

## 2023-04-23 ENCOUNTER — Emergency Department (HOSPITAL_BASED_OUTPATIENT_CLINIC_OR_DEPARTMENT_OTHER): Payer: Medicaid Other | Admitting: Radiology

## 2023-04-23 DIAGNOSIS — R0789 Other chest pain: Secondary | ICD-10-CM | POA: Insufficient documentation

## 2023-04-23 DIAGNOSIS — R102 Pelvic and perineal pain: Secondary | ICD-10-CM | POA: Diagnosis not present

## 2023-04-23 DIAGNOSIS — R0602 Shortness of breath: Secondary | ICD-10-CM | POA: Diagnosis not present

## 2023-04-23 DIAGNOSIS — E039 Hypothyroidism, unspecified: Secondary | ICD-10-CM | POA: Diagnosis not present

## 2023-04-23 DIAGNOSIS — R079 Chest pain, unspecified: Secondary | ICD-10-CM | POA: Diagnosis not present

## 2023-04-23 DIAGNOSIS — R9431 Abnormal electrocardiogram [ECG] [EKG]: Secondary | ICD-10-CM | POA: Diagnosis not present

## 2023-04-23 LAB — BASIC METABOLIC PANEL
Anion gap: 8 (ref 5–15)
BUN: 18 mg/dL (ref 6–20)
CO2: 26 mmol/L (ref 22–32)
Calcium: 9 mg/dL (ref 8.9–10.3)
Chloride: 105 mmol/L (ref 98–111)
Creatinine, Ser: 1.06 mg/dL — ABNORMAL HIGH (ref 0.44–1.00)
GFR, Estimated: >60 mL/min (ref >60)
Glucose, Bld: 93 mg/dL (ref 70–99)
Potassium: 3.7 mmol/L (ref 3.5–5.1)
Sodium: 139 mmol/L (ref 135–145)

## 2023-04-23 LAB — TROPONIN I (HIGH SENSITIVITY): Troponin I (High Sensitivity): 2 ng/L (ref <18)

## 2023-04-23 LAB — CBC
HCT: 37.9 % (ref 36.0–46.0)
Hemoglobin: 12.3 g/dL (ref 12.0–15.0)
MCH: 25.1 pg — ABNORMAL LOW (ref 26.0–34.0)
MCHC: 32.5 g/dL (ref 30.0–36.0)
MCV: 77.2 fL — ABNORMAL LOW (ref 80.0–100.0)
Platelets: 285 10*3/uL (ref 150–400)
RBC: 4.91 MIL/uL (ref 3.87–5.11)
RDW: 14.6 % (ref 11.5–15.5)
WBC: 9.5 10*3/uL (ref 4.0–10.5)
nRBC: 0 % (ref 0.0–0.2)

## 2023-04-23 NOTE — ED Triage Notes (Signed)
Pt reports sharp pain in left side of chest at nighttime for past week that paralyzes her left side. Occurrences last about 15-20 minutes. Progressively worsening per patient. No issues with muscle control or mobility at this time. Pt reports decreased sensation in left side at this time. Pt reports CP still present but only with breathing. Pt also reports fallopian tubes being removed September 16th. No menstrual period since. Reports severe pelvic pain that she's in PT for that has been going on since July. Pt reports OBGYN wants her to have blood work done.

## 2023-04-24 ENCOUNTER — Other Ambulatory Visit (HOSPITAL_BASED_OUTPATIENT_CLINIC_OR_DEPARTMENT_OTHER): Payer: Self-pay

## 2023-04-24 ENCOUNTER — Ambulatory Visit (HOSPITAL_BASED_OUTPATIENT_CLINIC_OR_DEPARTMENT_OTHER): Payer: Medicaid Other | Admitting: Certified Nurse Midwife

## 2023-04-24 ENCOUNTER — Encounter (HOSPITAL_BASED_OUTPATIENT_CLINIC_OR_DEPARTMENT_OTHER): Payer: Self-pay | Admitting: Certified Nurse Midwife

## 2023-04-24 ENCOUNTER — Emergency Department (HOSPITAL_BASED_OUTPATIENT_CLINIC_OR_DEPARTMENT_OTHER)
Admission: EM | Admit: 2023-04-24 | Discharge: 2023-04-24 | Disposition: A | Discharge status: Home / Self Care | Payer: Medicaid Other | Attending: Emergency Medicine | Admitting: Emergency Medicine

## 2023-04-24 VITALS — BP 128/80 | HR 85 | Ht 63.0 in | Wt 232.0 lb

## 2023-04-24 DIAGNOSIS — R9431 Abnormal electrocardiogram [ECG] [EKG]: Secondary | ICD-10-CM | POA: Diagnosis not present

## 2023-04-24 DIAGNOSIS — R1032 Left lower quadrant pain: Secondary | ICD-10-CM | POA: Diagnosis not present

## 2023-04-24 DIAGNOSIS — N926 Irregular menstruation, unspecified: Secondary | ICD-10-CM

## 2023-04-24 DIAGNOSIS — M2560 Stiffness of unspecified joint, not elsewhere classified: Secondary | ICD-10-CM | POA: Diagnosis not present

## 2023-04-24 DIAGNOSIS — E079 Disorder of thyroid, unspecified: Secondary | ICD-10-CM | POA: Diagnosis not present

## 2023-04-24 DIAGNOSIS — R0789 Other chest pain: Secondary | ICD-10-CM

## 2023-04-24 LAB — D-DIMER, QUANTITATIVE: D-Dimer, Quant: <0.27 ug{FEU}/mL (ref 0.00–0.50)

## 2023-04-24 LAB — HEMOGLOBIN A1C
Est. average glucose Bld gHb Est-mCnc: 126 mg/dL
Hgb A1c MFr Bld: 6 % — ABNORMAL HIGH (ref 4.8–5.6)

## 2023-04-24 LAB — TROPONIN I (HIGH SENSITIVITY): Troponin I (High Sensitivity): 3 ng/L (ref <18)

## 2023-04-24 LAB — HCG, QUANTITATIVE, PREGNANCY: hCG, Beta Chain, Quant, S: <1 m[IU]/mL (ref <5)

## 2023-04-24 MED ORDER — KETOROLAC TROMETHAMINE 60 MG/2ML IM SOLN
30.0000 mg | Freq: Once | INTRAMUSCULAR | Status: AC
Start: 2023-04-24 — End: 2023-04-24
  Administered 2023-04-24: 30 mg via INTRAMUSCULAR
  Filled 2023-04-24: qty 2

## 2023-04-24 MED ORDER — KETOROLAC TROMETHAMINE 30 MG/ML IJ SOLN: 30.0000 mg | Freq: Once | INTRAMUSCULAR | Status: DC

## 2023-04-24 MED ORDER — ESCITALOPRAM OXALATE 10 MG PO TABS
10.0000 mg | ORAL_TABLET | Freq: Every day | ORAL | 0 refills | Status: DC
  Filled 2023-04-24: qty 30, 30d supply, fill #0

## 2023-04-24 NOTE — ED Notes (Signed)
ED Provider at bedside. 
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
## 2023-04-24 NOTE — Progress Notes (Signed)
[name] Curry is a 42yo G1P1, Term SVD "[name] Curry" on 09/26/22. Iatrogenic cervical laceration and several 1st degree lacerations.  She is s/p Laparoscopic BTL on 02/15/23. She is bottlefeeding.  Patient states she bled (postpartum) for 6 weeks. Had Nexplanon inserted right arm at 6 week postpartum visit. Had Nexplanon removed from right arm on August 7th.   Pt states they tried for 10 years to get pregnant. She was not on any birth control during that 10 year period. Her periods during that 10 year period were "regular", "normal" "monthly" "but could stay on for a week or two sometimes".  Pt concerned today that she had a period in October but skipped November and has not started yet in December.  Serum HCG negative.  Pt reports pain in left groin. Limited range of motion left groin. If she is sitting for awhile, it is difficult to stand up. Trying to have intercourse is difficult. Pt states "I can only walk so far" before discomfort in left groin starts. Patient states this left groin pain and left groin limited range of motion started during the pregnancy but may be worse now than it was during pregnancy.  Pt tearful today when asked how she is doing emotionally. She doesn't feel like spouse is being supportive at all. She feels like she is caring for baby 24/7 and he continues to have free time (golf, etc.) . He is complaining that she isn't able to tolerate intercourse at this time.   Review of Systems Pertinent items are noted in HPI.    Objective:    BP 128/80 (BP Location: Right Arm, Patient Position: Sitting, Cuff Size: Normal)   Pulse 85   Ht 5\' 3"  (1.6 m)   Wt 232 lb (105.2 kg)   LMP 02/22/2023 (Exact Date) Comment: Had fallopian Tubes removed  BMI 41.10 kg/m  General appearance: alert, cooperative, fatigued, mild distress, and morbidly obese Abdomen: soft, non-tender; bowel sounds normal; no masses,  no organomegaly Pelvic: external genitalia normal, vagina normal without discharge, and  cervical laceration appears to be almost healed Extremities:  limited range of motion left hip Neurologic: Alert and oriented X 3, normal strength and tone. Normal symmetric reflexes. Normal coordination and gait Mental status: Alert, oriented, thought content appropriate Gait: affected by pain left groin    Assessment:   Missed period Left groin pain/limited ROM Postpartum/Situational Depression   Plan:   Will check labs today including TSH, repeat HgA1C Referral to Orthopedics to evaluate left hip/groin region (limited ROM onset during pregnancy). Discussed meds/counseling and pt agreeable to trial of Lexapro. RTO one week for follow-up.  Will plan future [place] to evaluate uterus/ovaries.  Letta Kocher

## 2023-04-24 NOTE — Discharge Instructions (Signed)
You were seen today for chest pain.  Your workup today is reassuring including cardiac testing and screening for blood clots.  Follow-up with your primary doctor.  Regarding your ongoing pelvic discomfort, you should follow-up with your OB/GYN.

## 2023-04-25 ENCOUNTER — Encounter: Payer: Self-pay | Admitting: Internal Medicine

## 2023-04-25 LAB — TSH: TSH: 9.33 u[IU]/mL — ABNORMAL HIGH (ref 0.450–4.500)

## 2023-04-25 LAB — T4, FREE: Free T4: 0.99 ng/dL (ref 0.82–1.77)

## 2023-04-26 ENCOUNTER — Other Ambulatory Visit: Payer: Self-pay | Admitting: Internal Medicine

## 2023-04-26 MED ORDER — LEVOTHYROXINE SODIUM 112 MCG PO TABS: 112.0000 ug | ORAL_TABLET | Freq: Every day | ORAL | 3 refills | Status: DC

## 2023-04-30 ENCOUNTER — Ambulatory Visit (INDEPENDENT_AMBULATORY_CARE_PROVIDER_SITE_OTHER): Payer: Medicaid Other | Admitting: Nurse Practitioner

## 2023-04-30 ENCOUNTER — Encounter: Payer: Self-pay | Admitting: Nurse Practitioner

## 2023-04-30 VITALS — BP 128/82 | HR 72 | Wt 233.6 lb

## 2023-04-30 DIAGNOSIS — E890 Postprocedural hypothyroidism: Secondary | ICD-10-CM

## 2023-04-30 DIAGNOSIS — M25552 Pain in left hip: Secondary | ICD-10-CM | POA: Diagnosis not present

## 2023-04-30 DIAGNOSIS — R0789 Other chest pain: Secondary | ICD-10-CM

## 2023-04-30 DIAGNOSIS — Z6841 Body Mass Index (BMI) 40.0 and over, adult: Secondary | ICD-10-CM | POA: Diagnosis not present

## 2023-04-30 DIAGNOSIS — E782 Mixed hyperlipidemia: Secondary | ICD-10-CM

## 2023-04-30 DIAGNOSIS — I4729 Other ventricular tachycardia: Secondary | ICD-10-CM | POA: Diagnosis not present

## 2023-04-30 DIAGNOSIS — E559 Vitamin D deficiency, unspecified: Secondary | ICD-10-CM

## 2023-04-30 DIAGNOSIS — R7303 Prediabetes: Secondary | ICD-10-CM

## 2023-04-30 MED ORDER — METOPROLOL TARTRATE 25 MG PO TABS: 25.0000 mg | ORAL_TABLET | Freq: Two times a day (BID) | ORAL | 3 refills | Status: DC

## 2023-04-30 MED ORDER — WEGOVY 0.25 MG/0.5ML ~~LOC~~ SOAJ: 0.2500 mg | SUBCUTANEOUS | 0 refills | Status: DC

## 2023-04-30 NOTE — Patient Instructions (Addendum)
I will do some research on Pudenal Nerve Entrapment to see if this could be something that could be triggering your pain.   I have sent in the metoprolol so you can take this once a day for palpitations and if you need a second dose, you can take this 6-12 hours later.   I have sent in the [facility]. We will work to get approval for this.   WEIGHT LOSS PLANNING Your progress today shows:     04/30/2023    1:39 PM 04/24/2023   11:01 AM 04/24/2023    4:00 AM  Vitals with BMI  Height  5\' 3"    Weight 233 lbs 10 oz 232 lbs   BMI 41.39 41.11   Systolic 128 128 782  Diastolic 82 80 74  Pulse 72 85 73    For best management of weight, it is vital to balance intake versus output. This means the number of calories burned per day must be less than the calories you take in with food and drink.   I recommend trying to follow a diet with the following: Calories: 1200-1500 calories per day Carbohydrates: 150-180 grams of carbohydrates per day  Why: Gives your body enough "quick fuel" for cells to maintain normal function without sending them into starvation mode.  Protein: At least 90 grams of protein per day- 30 grams with each meal Why: Protein takes longer and uses more energy than carbohydrates to break down for fuel. The carbohydrates in your meals serves as quick energy sources and proteins help use some of that extra quick energy to break down to produce long term energy. This helps you not feel hungry as quickly and protein breakdown burns calories.  Water: Drink AT LEAST 64 ounces of water per day  Why: Water is essential to healthy metabolism. Water helps to fill the stomach and keep you fuller longer. Water is required for healthy digestion and filtering of waste in the body.  Fat: Limit fats in your diet- when choosing fats, choose foods with lower fats content such as lean meats (chicken, fish, [place]).  Why: Increased fat intake leads to storage "for later". Once you burn your carbohydrate  energy, your body goes into fat and protein breakdown mode to help you loose weight.  Cholesterol: Fats and oils that are LIQUID at room temperature are best. Choose vegetable oils (olive oil, avocado oil, nuts). Avoid fats that are SOLID at room temperature (animal fats, processed meats). Healthy fats are often found in whole grains, beans, nuts, seeds, and berries.  Why: Elevated cholesterol levels lead to build up of cholesterol on the inside of your blood vessels. This will eventually cause the blood vessels to become hard and can lead to high blood pressure and damage to your organs. When the blood flow is reduced, but the pressure is high from cholesterol buildup, parts of the cholesterol can break off and form clots that can go to the brain or heart leading to a stroke or heart attack.  Fiber: Increase amount of SOLUBLE the fiber in your diet. This helps to fill you up, lowers cholesterol, and helps with digestion. Some foods high in soluble fiber are oats, peas, beans, apples, carrots, barley, and citrus fruits.   Why: Fiber fills you up, helps remove excess cholesterol, and aids in healthy digestion which are all very important in weight management.   I recommend the following as a minimum activity routine: Purposeful walk or other physical activity at least 20 minutes every single  day. This means purposefully taking a walk, jog, bike, swim, treadmill, elliptical, dance, etc.  This activity should be ABOVE your normal daily activities, such as walking at work. Goal exercise should be at least 150 minutes a week- work your way up to this.   Heart Rate: Your maximum exercise heart rate should be 220 - Your Age in Years. When exercising, get your heart rate up, but avoid going over the maximum targeted heart rate.  60-70% of your maximum heart rate is where you tend to burn the most fat. To find this number:  220 - Age In Years= Max HR  Max HR x 0.6 (or 0.7) = Fat Burning HR The Fat Burning  HR is your goal heart rate while working out to burn the most fat.  NEVER exercise to the point your feel lightheaded, weak, nauseated, dizzy. If you experience ANY of these symptoms- STOP exercise! Allow yourself to cool down and your heart rate to come down. Then restart slower next time.  If at ANY TIME you feel chest pain or chest pressure during exercise, STOP IMMEDIATELY and seek medical attention.

## 2023-04-30 NOTE — Progress Notes (Unsigned)
Tollie Eth, DNP, AGNP-c [facility] [address] [place], [place] [address] [phone]   ACUTE VISIT- ESTABLISHED PATIENT  Blood pressure 128/82, pulse 72, weight 233 lb 9.6 oz (106 kg), last menstrual period 02/22/2023, unknown if currently breastfeeding.  Subjective:  HPI [name] Curry is a 42 y.o. female presents to day for evaluation of acute concern(s).   Pudenal nerve entrapment  The patient, a postpartum individual with a history of thyroid disease, presents with a complex array of symptoms. The primary concern is recurrent episodes of severe, paralyzing chest pain, predominantly on the left side. These episodes, which have been occurring for some time, typically manifest in the evening and last for about a minute. However, the frequency and duration of these episodes have recently increased, with the most severe episode lasting for approximately fifteen minutes. The pain is described as a sharp, shooting sensation that intensifies with each breath, akin to a 'ball of lightning.' Despite these alarming symptoms, a recent hospital evaluation reportedly showed no abnormalities.  In addition to the chest pain, the patient reports persistent pelvic pain and pressure, particularly on the left side. This pain, initially attributed to hip issues, has been identified as pelvic in nature and is reminiscent of the discomfort experienced during pregnancy. The pain has been described as excruciating and has significantly impacted the patient's mobility and quality of life. The patient reports difficulty standing for extended periods, walking, and even sitting for more than thirty minutes due to the intense pain. The pain also interferes with sleep, particularly when lying on the left side.  The patient's medical history is significant for a complicated childbirth, during which the cervix was accidentally cut. This led to the removal of the fallopian tubes to prevent future  miscarriages. The patient has been experiencing pelvic pain since pregnancy, which has worsened postpartum. The patient is currently undergoing physical therapy for this issue.  The patient's thyroid disease has also been a concern. The patient reports a recent increase in weight and a rise in A1c levels, despite efforts to manage these issues. The patient has been on levothyroxine, but the dosage was not adjusted postpartum, leading to a significant increase in weight. The patient also reports experiencing heart palpitations or 'flutters,' which have been increasing in frequency.  The patient has previously been on metoprolol and semaglutide (Ozempic), both of which were reportedly effective in managing symptoms and promoting weight loss. However, these medications were discontinued during pregnancy. The patient is currently seeking to restart these medications and is also considering Wegovy for weight management, pending insurance approval.  ROS negative except for what is listed in HPI. History, Medications, Surgery, SDOH, and Family History reviewed and updated as appropriate.  Objective:  Physical Exam Vitals and nursing note reviewed.  Constitutional:      Appearance: Normal appearance.  HENT:     Head: Normocephalic.  Eyes:     Pupils: Pupils are equal, round, and reactive to light.  Neck:     Vascular: No carotid bruit.  Cardiovascular:     Rate and Rhythm: Normal rate and regular rhythm.     Pulses: Normal pulses.     Heart sounds: Normal heart sounds.  Pulmonary:     Effort: Pulmonary effort is normal.     Breath sounds: Normal breath sounds.  Abdominal:     General: Bowel sounds are normal. There is no distension.     Palpations: Abdomen is soft.     Tenderness: There is no abdominal tenderness.  There is no guarding.  Musculoskeletal:        General: Tenderness present. No swelling. Normal range of motion.     Cervical back: Normal range of motion.  Skin:    General: Skin  is warm.     Capillary Refill: Capillary refill takes less than 2 seconds.  Neurological:     General: No focal deficit present.     Mental Status: She is alert and oriented to person, place, and time.     Sensory: No sensory deficit.     Motor: No weakness.     Coordination: Coordination normal.     Gait: Gait abnormal.     Deep Tendon Reflexes: Reflexes normal.  Psychiatric:        Mood and Affect: Mood normal.          Assessment & Plan:   Problem List Items Addressed This Visit     Postsurgical hypothyroidism   Thyroid levels are significantly off. Current dose of levothyroxine increased two days ago. Symptoms include weight gain, palpitations, and chest pain. Explained that it may take up to three months to see improvement in symptoms after adjusting levothyroxine dosage. - Continue current levothyroxine dose - Recheck thyroid function in three months - Monitor for symptom improvement      Relevant Medications   metoprolol tartrate (LOPRESSOR) 25 MG tablet   BMI 36.0-36.9,adult   Significant weight gain post-pregnancy. Previous success with semaglutide (Ozempic/Wegovy). Current barriers include pelvic pain and thyroid dysfunction. Denied Wegovy due to incomplete documentation from previous provider. Discussed that weight loss can improve pelvic pain and overall health. Explained that Reginal Lutes is a good option due to previous success and lack of stimulants, which are contraindicated due to thyroid and heart conditions. - Submit new request for [facility] with complete documentation - Continue weight management program at [facility] - Document six months of diet and exercise efforts for insurance purposes      Relevant Medications   Semaglutide-Weight Management (WEGOVY) 0.25 MG/0.5ML SOAJ   Pre-diabetes - Primary   A1c in prediabetes range. Previous A1c was 5.6%. Weight gain and thyroid dysfunction likely contributing factors. Discussed that weight management and thyroid control  are crucial in managing prediabetes. - Monitor A1c levels - Continue weight management efforts - Reassess in follow-up       Relevant Medications   Semaglutide-Weight Management (WEGOVY) 0.25 MG/0.5ML SOAJ   NSVT (nonsustained ventricular tachycardia) (HCC)   Followed with Dr. Cristal Deer. No alarm symptoms present at this time. Does not appear to correlate with CP, which is reassuring. Continue to monitor,       Relevant Medications   metoprolol tartrate (LOPRESSOR) 25 MG tablet   Non-cardiac chest pain   Intermittent chest pain with episodes of paralyzing pain on the left side, primarily occurring at night. Recent episode lasted 15 minutes. Hospital evaluation showed normal chest x-ray, D-dimer, heart rhythm, EKG, and troponin levels. Thyroid dysfunction identified as a potential cause. Differential includes thyroid dysfunction, nerve entrapment, and musculoskeletal pain. Discussed that thyroid dysfunction can cause chest pressure, pain, palpitations, and flutters. Explained that metoprolol can help manage palpitations and chest pain. - Restart metoprolol 25 mg once daily, can increase to twice daily if needed - Monitor thyroid function and adjust levothyroxine dosage as needed - Follow-up if chest pain episodes increase in frequency or severity      Pain, joint, pelvic region, left   Chronic pelvic pain and pressure, exacerbated by physical activity and prolonged sitting. History of fallopian tube  removal and cervix injury during childbirth. Physical therapy ongoing. Possible nerve entrapment or ligament damage. Discussed that pelvic pain may be due to nerve entrapment, specifically the pudendal nerve, which can cause pain in the pelvis, buttocks, and thighs, and worsen with sitting and sexual activity. Explained that MRI can help identify nerve entrapment or ligament damage. - Order MRI to evaluate for nerve entrapment or ligament damage - Continue physical therapy - Refer to  orthopedics for further evaluation      Vitamin D deficiency   Mixed hyperlipidemia   Relevant Medications   Semaglutide-Weight Management (WEGOVY) 0.25 MG/0.5ML SOAJ   metoprolol tartrate (LOPRESSOR) 25 MG tablet      Tollie Eth, DNP, AGNP-c

## 2023-05-03 DIAGNOSIS — R0789 Other chest pain: Secondary | ICD-10-CM | POA: Insufficient documentation

## 2023-05-03 DIAGNOSIS — M25552 Pain in left hip: Secondary | ICD-10-CM | POA: Insufficient documentation

## 2023-05-03 NOTE — Assessment & Plan Note (Signed)
Thyroid levels are significantly off. Current dose of levothyroxine increased two days ago. Symptoms include weight gain, palpitations, and chest pain. Explained that it may take up to three months to see improvement in symptoms after adjusting levothyroxine dosage. - Continue current levothyroxine dose - Recheck thyroid function in three months - Monitor for symptom improvement

## 2023-05-03 NOTE — Assessment & Plan Note (Signed)
A1c in prediabetes range. Previous A1c was 5.6%. Weight gain and thyroid dysfunction likely contributing factors. Discussed that weight management and thyroid control are crucial in managing prediabetes. - Monitor A1c levels - Continue weight management efforts - Reassess in follow-up

## 2023-05-03 NOTE — Assessment & Plan Note (Signed)
Intermittent chest pain with episodes of paralyzing pain on the left side, primarily occurring at night. Recent episode lasted 15 minutes. Hospital evaluation showed normal chest x-ray, D-dimer, heart rhythm, EKG, and troponin levels. Thyroid dysfunction identified as a potential cause. Differential includes thyroid dysfunction, nerve entrapment, and musculoskeletal pain. Discussed that thyroid dysfunction can cause chest pressure, pain, palpitations, and flutters. Explained that metoprolol can help manage palpitations and chest pain. - Restart metoprolol 25 mg once daily, can increase to twice daily if needed - Monitor thyroid function and adjust levothyroxine dosage as needed - Follow-up if chest pain episodes increase in frequency or severity

## 2023-05-03 NOTE — Assessment & Plan Note (Signed)
Significant weight gain post-pregnancy. Previous success with semaglutide (Ozempic/Wegovy). Current barriers include pelvic pain and thyroid dysfunction. Denied Wegovy due to incomplete documentation from previous provider. Discussed that weight loss can improve pelvic pain and overall health. Explained that Reginal Lutes is a good option due to previous success and lack of stimulants, which are contraindicated due to thyroid and heart conditions. - Submit new request for [facility] with complete documentation - Continue weight management program at [facility] - Document six months of diet and exercise efforts for insurance purposes

## 2023-05-03 NOTE — Assessment & Plan Note (Signed)
Followed with Dr. Cristal Deer. No alarm symptoms present at this time. Does not appear to correlate with CP, which is reassuring. Continue to monitor,

## 2023-05-03 NOTE — Assessment & Plan Note (Signed)
Chronic pelvic pain and pressure, exacerbated by physical activity and prolonged sitting. History of fallopian tube removal and cervix injury during childbirth. Physical therapy ongoing. Possible nerve entrapment or ligament damage. Discussed that pelvic pain may be due to nerve entrapment, specifically the pudendal nerve, which can cause pain in the pelvis, buttocks, and thighs, and worsen with sitting and sexual activity. Explained that MRI can help identify nerve entrapment or ligament damage. - Order MRI to evaluate for nerve entrapment or ligament damage - Continue physical therapy - Refer to orthopedics for further evaluation

## 2023-05-07 ENCOUNTER — Ambulatory Visit: Payer: Medicaid Other

## 2023-05-07 ENCOUNTER — Ambulatory Visit
Admission: RE | Admit: 2023-05-07 | Discharge: 2023-05-07 | Disposition: A | Discharge status: Home / Self Care | Payer: Medicaid Other | Source: Ambulatory Visit | Attending: Nurse Practitioner

## 2023-05-07 ENCOUNTER — Encounter: Payer: Medicaid Other | Admitting: Orthopedic Surgery

## 2023-05-07 DIAGNOSIS — N6320 Unspecified lump in the left breast, unspecified quadrant: Secondary | ICD-10-CM

## 2023-05-07 DIAGNOSIS — Z803 Family history of malignant neoplasm of breast: Secondary | ICD-10-CM

## 2023-05-07 DIAGNOSIS — R928 Other abnormal and inconclusive findings on diagnostic imaging of breast: Secondary | ICD-10-CM | POA: Diagnosis not present

## 2023-05-08 DIAGNOSIS — E66813 Obesity, class 3: Secondary | ICD-10-CM | POA: Diagnosis not present

## 2023-05-08 DIAGNOSIS — Z713 Dietary counseling and surveillance: Secondary | ICD-10-CM | POA: Diagnosis not present

## 2023-05-08 DIAGNOSIS — R7303 Prediabetes: Secondary | ICD-10-CM | POA: Diagnosis not present

## 2023-05-08 DIAGNOSIS — Z6841 Body Mass Index (BMI) 40.0 and over, adult: Secondary | ICD-10-CM | POA: Diagnosis not present

## 2023-05-10 ENCOUNTER — Other Ambulatory Visit (HOSPITAL_COMMUNITY): Payer: Self-pay

## 2023-05-10 ENCOUNTER — Telehealth: Payer: Self-pay

## 2023-05-10 DIAGNOSIS — Z6841 Body Mass Index (BMI) 40.0 and over, adult: Secondary | ICD-10-CM | POA: Diagnosis not present

## 2023-05-10 DIAGNOSIS — E66813 Obesity, class 3: Secondary | ICD-10-CM | POA: Diagnosis not present

## 2023-05-10 DIAGNOSIS — R634 Abnormal weight loss: Secondary | ICD-10-CM | POA: Diagnosis not present

## 2023-05-10 NOTE — Telephone Encounter (Signed)
Pharmacy Patient Advocate Encounter   Received notification from CoverMyMeds that prior authorization for [facility] is required/requested.   Insurance verification completed.   The patient is insured through [facility] [place] .   Per test claim: PA required; PA submitted to above mentioned insurance via CoverMyMeds Key/confirmation #/EOC Key: [id number]  Status is pending

## 2023-05-11 ENCOUNTER — Encounter: Payer: Medicaid Other | Admitting: Orthopedic Surgery

## 2023-05-11 ENCOUNTER — Other Ambulatory Visit (HOSPITAL_COMMUNITY): Payer: Self-pay

## 2023-05-11 NOTE — Telephone Encounter (Signed)
Pharmacy Patient Advocate Encounter  Received notification from [facility] Medicaid that Prior Authorization for [name] Curry has been APPROVED from 12.19.24 to 06.17.25. Ran test claim, Copay is $RTS, the next payable fill will be on/after 1.8.25. This test claim was processed through [facility]- copay amounts may vary at other pharmacies due to pharmacy/plan contracts, or as the patient moves through the different stages of their insurance plan.   PA #/Case ID/Reference #: Key: Sindy Messing

## 2023-05-12 ENCOUNTER — Other Ambulatory Visit (HOSPITAL_BASED_OUTPATIENT_CLINIC_OR_DEPARTMENT_OTHER): Payer: Self-pay | Admitting: Obstetrics & Gynecology

## 2023-05-17 ENCOUNTER — Encounter: Payer: Medicaid Other | Admitting: Nurse Practitioner

## 2023-05-17 NOTE — Telephone Encounter (Signed)
Please call pt and let her know the pharmacy sent a RF for her iron.  Her hb was back to normal in early December so ok to stop this.  Rx was denied for this reason.  Thanks.

## 2023-05-23 DIAGNOSIS — Z419 Encounter for procedure for purposes other than remedying health state, unspecified: Secondary | ICD-10-CM | POA: Diagnosis not present

## 2023-06-05 ENCOUNTER — Ambulatory Visit: Payer: BLUE CROSS/BLUE SHIELD | Admitting: Physical Therapy

## 2023-06-06 ENCOUNTER — Other Ambulatory Visit: Payer: Self-pay

## 2023-06-06 ENCOUNTER — Ambulatory Visit: Payer: Medicaid Other | Admitting: Orthopedic Surgery

## 2023-06-06 ENCOUNTER — Encounter: Payer: Self-pay | Admitting: Orthopedic Surgery

## 2023-06-06 DIAGNOSIS — M25552 Pain in left hip: Secondary | ICD-10-CM

## 2023-06-06 DIAGNOSIS — M25559 Pain in unspecified hip: Secondary | ICD-10-CM
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
## 2023-06-07 ENCOUNTER — Ambulatory Visit: Payer: Medicaid Other | Admitting: Physical Therapy

## 2023-06-09 DIAGNOSIS — J189 Pneumonia, unspecified organism: Secondary | ICD-10-CM | POA: Diagnosis not present

## 2023-06-09 DIAGNOSIS — R051 Acute cough: Secondary | ICD-10-CM | POA: Diagnosis not present

## 2023-06-12 ENCOUNTER — Ambulatory Visit: Payer: Medicaid Other | Admitting: Physical Therapy

## 2023-06-14 ENCOUNTER — Ambulatory Visit: Payer: Medicaid Other | Admitting: Physical Therapy

## 2023-06-14 DIAGNOSIS — R110 Nausea: Secondary | ICD-10-CM | POA: Diagnosis not present

## 2023-06-14 DIAGNOSIS — J189 Pneumonia, unspecified organism: Secondary | ICD-10-CM | POA: Diagnosis not present

## 2023-06-19 ENCOUNTER — Ambulatory Visit: Payer: Medicaid Other | Admitting: Physical Therapy

## 2023-06-19 DIAGNOSIS — Z20822 Contact with and (suspected) exposure to covid-19: Secondary | ICD-10-CM | POA: Diagnosis not present

## 2023-06-19 DIAGNOSIS — E66812 Obesity, class 2: Secondary | ICD-10-CM | POA: Diagnosis not present

## 2023-06-19 DIAGNOSIS — Z6838 Body mass index (BMI) 38.0-38.9, adult: Secondary | ICD-10-CM | POA: Diagnosis not present

## 2023-06-19 DIAGNOSIS — R634 Abnormal weight loss: Secondary | ICD-10-CM | POA: Diagnosis not present

## 2023-06-19 DIAGNOSIS — Z713 Dietary counseling and surveillance: Secondary | ICD-10-CM | POA: Diagnosis not present

## 2023-06-22 ENCOUNTER — Encounter: Payer: Self-pay | Admitting: Orthopedic Surgery

## 2023-06-22 NOTE — Progress Notes (Deleted)
 Last covid vaccine:

## 2023-06-23 DIAGNOSIS — Z419 Encounter for procedure for purposes other than remedying health state, unspecified: Secondary | ICD-10-CM | POA: Diagnosis not present

## 2023-06-26 ENCOUNTER — Encounter: Payer: Medicaid Other | Admitting: Nurse Practitioner

## 2023-06-27 ENCOUNTER — Ambulatory Visit: Payer: Medicaid Other | Admitting: Orthopedic Surgery

## 2023-06-29 ENCOUNTER — Telehealth (HOSPITAL_BASED_OUTPATIENT_CLINIC_OR_DEPARTMENT_OTHER): Payer: Self-pay

## 2023-06-29 NOTE — Telephone Encounter (Signed)
 Patient called with concerns of vaginal bleeding. Patient states she is bleeding heavy with lots of blood clots. She is soaking a tampon and a maxi pad every 10 minutes. Patient was advised to go to the ER to be evaluated per Arland Roller. Patient will send us  an update via mychart. tbw

## 2023-06-30 ENCOUNTER — Ambulatory Visit
Admission: RE | Admit: 2023-06-30 | Discharge: 2023-06-30 | Disposition: A | Discharge status: Home / Self Care | Payer: Medicaid Other | Source: Ambulatory Visit | Attending: Orthopedic Surgery | Admitting: Orthopedic Surgery

## 2023-06-30 DIAGNOSIS — M25559 Pain in unspecified hip: Secondary | ICD-10-CM

## 2023-06-30 DIAGNOSIS — M461 Sacroiliitis, not elsewhere classified: Secondary | ICD-10-CM | POA: Diagnosis not present

## 2023-06-30 DIAGNOSIS — M25551 Pain in right hip: Secondary | ICD-10-CM | POA: Diagnosis not present

## 2023-06-30 DIAGNOSIS — M25552 Pain in left hip: Secondary | ICD-10-CM | POA: Diagnosis not present

## 2023-07-04 DIAGNOSIS — Z1329 Encounter for screening for other suspected endocrine disorder: Secondary | ICD-10-CM | POA: Diagnosis not present

## 2023-07-04 DIAGNOSIS — E611 Iron deficiency: Secondary | ICD-10-CM | POA: Diagnosis not present

## 2023-07-04 DIAGNOSIS — I4729 Other ventricular tachycardia: Secondary | ICD-10-CM | POA: Diagnosis not present

## 2023-07-04 DIAGNOSIS — Z130 Encounter for screening for diseases of the blood and blood-forming organs and certain disorders involving the immune mechanism: Secondary | ICD-10-CM | POA: Diagnosis not present

## 2023-07-04 DIAGNOSIS — Z0000 Encounter for general adult medical examination without abnormal findings: Secondary | ICD-10-CM | POA: Diagnosis not present

## 2023-07-04 DIAGNOSIS — E890 Postprocedural hypothyroidism: Secondary | ICD-10-CM | POA: Diagnosis not present

## 2023-07-04 DIAGNOSIS — E782 Mixed hyperlipidemia: Secondary | ICD-10-CM | POA: Diagnosis not present

## 2023-07-04 DIAGNOSIS — E559 Vitamin D deficiency, unspecified: Secondary | ICD-10-CM | POA: Diagnosis not present

## 2023-07-04 DIAGNOSIS — R7303 Prediabetes: Secondary | ICD-10-CM | POA: Diagnosis not present

## 2023-07-06 ENCOUNTER — Ambulatory Visit: Payer: Medicaid Other | Admitting: Orthopedic Surgery

## 2023-07-09 NOTE — Progress Notes (Signed)
 I called si inflammation pls have him see brtooks for bilateral si injection thx

## 2023-07-18 DIAGNOSIS — Z6837 Body mass index (BMI) 37.0-37.9, adult: Secondary | ICD-10-CM | POA: Diagnosis not present

## 2023-07-18 DIAGNOSIS — R634 Abnormal weight loss: Secondary | ICD-10-CM | POA: Diagnosis not present

## 2023-07-18 DIAGNOSIS — E66812 Obesity, class 2: Secondary | ICD-10-CM | POA: Diagnosis not present

## 2023-07-21 DIAGNOSIS — Z419 Encounter for procedure for purposes other than remedying health state, unspecified: Secondary | ICD-10-CM | POA: Diagnosis not present

## 2023-07-23 ENCOUNTER — Ambulatory Visit: Payer: Medicaid Other | Admitting: Orthopedic Surgery

## 2023-07-23 ENCOUNTER — Encounter: Payer: Self-pay | Admitting: Orthopedic Surgery

## 2023-07-23 DIAGNOSIS — M461 Sacroiliitis, not elsewhere classified: Secondary | ICD-10-CM | POA: Diagnosis not present
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
## 2023-07-24 ENCOUNTER — Encounter: Payer: Self-pay | Admitting: Orthopedic Surgery

## 2023-07-25 ENCOUNTER — Other Ambulatory Visit: Payer: Self-pay | Admitting: Orthopedic Surgery

## 2023-07-25 DIAGNOSIS — Z1384 Encounter for screening for dental disorders: Secondary | ICD-10-CM | POA: Diagnosis not present

## 2023-07-25 LAB — CBC WITH DIFFERENTIAL/PLATELET
Absolute Lymphocytes: 3686 {cells}/uL (ref 850–3900)
Absolute Monocytes: 582 {cells}/uL (ref 200–950)
Basophils Absolute: 39 {cells}/uL (ref 0–200)
Basophils Relative: 0.4 %
Eosinophils Absolute: 58 {cells}/uL (ref 15–500)
Eosinophils Relative: 0.6 %
HCT: 39.4 % (ref 35.0–45.0)
Hemoglobin: 12.7 g/dL (ref 11.7–15.5)
MCH: 24.7 pg — ABNORMAL LOW (ref 27.0–33.0)
MCHC: 32.2 g/dL (ref 32.0–36.0)
MCV: 76.7 fL — ABNORMAL LOW (ref 80.0–100.0)
MPV: 10.3 fL (ref 7.5–12.5)
Monocytes Relative: 6 %
Neutro Abs: 5335 {cells}/uL (ref 1500–7800)
Neutrophils Relative %: 55 %
Platelets: 338 10*3/uL (ref 140–400)
RBC: 5.14 10*6/uL — ABNORMAL HIGH (ref 3.80–5.10)
RDW: 14.8 % (ref 11.0–15.0)
Total Lymphocyte: 38 %
WBC: 9.7 10*3/uL (ref 3.8–10.8)

## 2023-07-25 LAB — CYCLIC CITRUL PEPTIDE ANTIBODY, IGG: Cyclic Citrullin Peptide Ab: <16 U

## 2023-07-25 LAB — ANA: Anti Nuclear Antibody (ANA): NEGATIVE

## 2023-07-25 LAB — RHEUMATOID FACTOR: Rheumatoid fact SerPl-aCnc: <10 [IU]/mL (ref <14)

## 2023-07-25 LAB — HIGH SENSITIVITY CRP: hs-CRP: 3.8 mg/L — ABNORMAL HIGH

## 2023-07-25 LAB — SEDIMENTATION RATE: Sed Rate: 2 mm/h (ref 0–20)

## 2023-07-25 MED ORDER — TRAMADOL HCL 50 MG PO TABS: 50.0000 mg | ORAL_TABLET | Freq: Two times a day (BID) | ORAL | 0 refills | Status: DC | PRN

## 2023-07-25 NOTE — Telephone Encounter (Signed)
Ultram sent thx

## 2023-07-26 NOTE — Progress Notes (Signed)
 I called.

## 2023-07-28 ENCOUNTER — Other Ambulatory Visit: Payer: Self-pay | Admitting: Nurse Practitioner

## 2023-07-28 DIAGNOSIS — I4729 Other ventricular tachycardia: Secondary | ICD-10-CM

## 2023-07-30 ENCOUNTER — Encounter: Payer: Medicaid Other | Admitting: Nurse Practitioner

## 2023-07-30 NOTE — Progress Notes (Deleted)
  Shawna Clamp, DNP, AGNP-c [facility]  [address] [place], [place] [address] [phone]  ESTABLISHED PATIENT- Chronic Health and/or Follow-Up Visit  unknown if currently breastfeeding.    [name] Curry is a 43 y.o. year old female presenting today for evaluation and management of chronic conditions: Med Check for weight management.     All ROS negative with exception of what is listed above.   PHYSICAL EXAM Physical Exam   PLAN Problem List Items Addressed This Visit   None   No follow-ups on file.  Shawna Clamp, DNP, AGNP-c

## 2023-08-03 ENCOUNTER — Ambulatory Visit: Admitting: Sports Medicine

## 2023-08-03 ENCOUNTER — Other Ambulatory Visit: Payer: Self-pay

## 2023-08-03 ENCOUNTER — Encounter: Payer: Self-pay | Admitting: Sports Medicine

## 2023-08-03 DIAGNOSIS — M2141 Flat foot [pes planus] (acquired), right foot: Secondary | ICD-10-CM | POA: Diagnosis not present

## 2023-08-03 DIAGNOSIS — M2142 Flat foot [pes planus] (acquired), left foot: Secondary | ICD-10-CM | POA: Diagnosis not present

## 2023-08-03 DIAGNOSIS — M461 Sacroiliitis, not elsewhere classified: Secondary | ICD-10-CM

## 2023-08-03 DIAGNOSIS — S86899A Other injury of other muscle(s) and tendon(s) at lower leg level, unspecified leg, initial encounter: Secondary | ICD-10-CM

## 2023-08-03 DIAGNOSIS — S86891A Other injury of other muscle(s) and tendon(s) at lower leg level, right leg, initial encounter: Secondary | ICD-10-CM | POA: Diagnosis not present

## 2023-08-03 DIAGNOSIS — M7918 Myalgia, other site: Secondary | ICD-10-CM

## 2023-08-03 NOTE — Progress Notes (Signed)
 Office & Procedure Note  Patient: [name] Curry             Date of Birth: [birth year]           MRN: [id number]             Visit Date: 08/03/2023  HPI: [name] Curry is a very pleasant 43 year old female who presents with bilateral SI joint pain radiating into the buttocks and legs.  Also mentions she has pain in both of her shins/legs as well.  This radiates from the upper shins down to the ankles more so on the medial side.  Has not been in orthotics before, but is aware about flatfeet. She does have tramadol 50 mg to take for breakthrough symptoms.  She does have a newborn daughter that was born by spontaneous vaginal delivery about 10 months ago.  She has had chronic pain in the low back and SI joint region for years but certainly worsened after the birth of her daughter.  She did have numerous initial rheumatology laboratory workup, which was largely unremarkable although a very mildly elevated CRP.  She has had a referral to rheumatology and is seeing them in August.  PE:  -There is positive TTP just inferior to the PSIS bilaterally near the region of the SI joint, left greater than right.  There is full range of motion with lumbar flexion and extension. + Fortin's point test, positive FABER test bilaterally.  -Legs: There is notable pes planus with significant flattening of the longitudinal arch upon standing.  There is no bony tenderness of the tibia more so than the medial soft tissue.  Imaging: MR Pelvis w/o contrast CLINICAL DATA:  Bilateral hip pain, left greater than right for 1 year. No known injury. Bilateral groin pain.  EXAM: MRI PELVIS WITHOUT CONTRAST  TECHNIQUE: Multiplanar multisequence MR imaging of the pelvis was performed. No intravenous contrast was administered.  COMPARISON:  None Available.  FINDINGS: Bones:  No hip fracture, dislocation or avascular necrosis.  No periosteal reaction or bone destruction. No aggressive osseous lesion.  Bilateral  sacroiliitis with subchondral sclerosis bilaterally and mild subchondral marrow edema along the sacral aspect bilaterally.  Small amount of fluid in the pubic symphysis with subchondral marrow edema bilaterally as can be seen with osteitis pubis.  Articular cartilage and labrum  Articular cartilage:  No chondral defect.  Labrum: Grossly intact, but evaluation is limited by lack of intraarticular fluid.  Joint or bursal effusion  Joint effusion: No hip joint effusion. Small left SI joint effusion.  Bursae:  No bursal fluid.  Muscles and tendons  Flexors: Normal.  Extensors: Normal.  Abductors: Normal.  Adductors: Normal.  Gluteals: Normal.  Hamstrings: Normal.  Other findings  No pelvic free fluid. No fluid collection or hematoma. No inguinal lymphadenopathy. No inguinal hernia.  IMPRESSION: 1. Bilateral sacroiliitis with subchondral sclerosis bilaterally and mild subchondral marrow edema along the sacral aspect bilaterally. Differential considerations include an inflammatory or crystalline arthropathy, ankylosing spondylitis, osteoarthritis, versus enteropathic arthritis amongst other etiologies. 2. Small amount of fluid in the pubic symphysis with subchondral marrow edema bilaterally as can be seen with osteitis pubis. 3. No hip fracture, dislocation or avascular necrosis.  Electronically Signed   By: Elige Ko M.D.   On: 07/08/2023 09:16   Visit Diagnoses:  1. Bilateral sacroiliitis (HCC)   2. Bilateral buttock pain   3. Medial tibial stress syndrome, unspecified laterality, initial encounter   4. Pes planus of both feet  Procedures:  U/S-guided SI-joint injection, Right   After discussion of risk/benefits/indications, informed verbal consent was obtained. A timeout was then performed. The patient was positioned in a prone position on exam room table with a pillow placed under the pelvis for mild hip flexion. The SI joint area was cleaned and  prepped with betadine and alcohol swabs. Sterile ultrasound gel was applied and the ultrasound transducer was placed in an anatomic axial plane over the PSIS, then moved distally over the SI-joint. Using ultrasound guidance, a 22-gauge, 3.5" needle was inserted from a medial to lateral approach utilizing an in-plane approach and directed into the SI-joint. The SI-joint was then injected with a mixture of 4:2 lidocaine:depomedrol with visualization of the injectate flow into the SI-joint under ultrasound visualization. The patient tolerated the procedure well without immediate complications.  U/S-guided SI-joint injection, Left  After discussion of risk/benefits/indications, informed verbal consent was obtained. A timeout was then performed. The patient was positioned in a prone position on exam room table with a pillow placed under the pelvis for mild hip flexion. The SI joint area was cleaned and prepped with betadine and alcohol swabs. Sterile ultrasound gel was applied and the ultrasound transducer was placed in an anatomic axial plane over the PSIS, then moved distally over the SI-joint. Using ultrasound guidance, a 22-gauge, 3.5" needle was inserted from a medial to lateral approach utilizing an in-plane approach and directed into the SI-joint. The SI-joint was then injected with a mixture of 4:2 lidocaine:depomedrol with visualization of the injectate flow into the SI-joint under ultrasound visualization. The patient tolerated the procedure well without immediate complications.  Plan: -Did review MRI which shows bilateral sacroiliitis, review of initial laboratory workup was not diagnostic for underlying rheumatologic disease, but I do think she would benefit from rheumatology evaluation which is upcoming in August -Through shared decision making, we did proceed with ultrasound-guided right and left SI joint injection, patient tolerated well. -Discussed modified rest/activity for the first 48 hours as  well as postinjection protocol.  She may use ice/heat or over-the-counter anti-inflammatories as needed for pain control.  She also has tramadol for breakthrough pain -I do believe that her shin pain is likely emanating from her pes planus but she has overpronation and loss of her arch.  I fit her for orthotics to help support the arch of her foot and she found these to be comfortable. -She may follow-up with Dr. August Saucer as indicated; I am happy to see her back as needed  Madelyn Brunner, DO Primary Care Sports Medicine Physician  [facility]  This note was dictated using Dragon naturally speaking software and may contain errors in syntax, spelling, or content which have not been identified prior to signing this note.

## 2023-08-05 ENCOUNTER — Emergency Department (HOSPITAL_BASED_OUTPATIENT_CLINIC_OR_DEPARTMENT_OTHER)

## 2023-08-05 ENCOUNTER — Emergency Department (HOSPITAL_BASED_OUTPATIENT_CLINIC_OR_DEPARTMENT_OTHER)
Admission: EM | Admit: 2023-08-05 | Discharge: 2023-08-06 | Disposition: A | Discharge status: Home / Self Care | Attending: Emergency Medicine | Admitting: Emergency Medicine

## 2023-08-05 ENCOUNTER — Encounter (HOSPITAL_BASED_OUTPATIENT_CLINIC_OR_DEPARTMENT_OTHER): Payer: Self-pay

## 2023-08-05 ENCOUNTER — Other Ambulatory Visit: Payer: Self-pay

## 2023-08-05 DIAGNOSIS — N3289 Other specified disorders of bladder: Secondary | ICD-10-CM | POA: Diagnosis not present

## 2023-08-05 DIAGNOSIS — R112 Nausea with vomiting, unspecified: Secondary | ICD-10-CM

## 2023-08-05 DIAGNOSIS — R10819 Abdominal tenderness, unspecified site: Secondary | ICD-10-CM | POA: Diagnosis not present

## 2023-08-05 DIAGNOSIS — K828 Other specified diseases of gallbladder: Secondary | ICD-10-CM | POA: Diagnosis not present

## 2023-08-05 DIAGNOSIS — R1084 Generalized abdominal pain: Secondary | ICD-10-CM | POA: Diagnosis not present

## 2023-08-05 DIAGNOSIS — R109 Unspecified abdominal pain: Secondary | ICD-10-CM

## 2023-08-05 DIAGNOSIS — D72829 Elevated white blood cell count, unspecified: Secondary | ICD-10-CM | POA: Diagnosis not present

## 2023-08-05 LAB — CBC
HCT: 41.4 % (ref 36.0–46.0)
Hemoglobin: 13.6 g/dL (ref 12.0–15.0)
MCH: 24.5 pg — ABNORMAL LOW (ref 26.0–34.0)
MCHC: 32.9 g/dL (ref 30.0–36.0)
MCV: 74.5 fL — ABNORMAL LOW (ref 80.0–100.0)
Platelets: 326 10*3/uL (ref 150–400)
RBC: 5.56 MIL/uL — ABNORMAL HIGH (ref 3.87–5.11)
RDW: 14.6 % (ref 11.5–15.5)
WBC: 10.6 10*3/uL — ABNORMAL HIGH (ref 4.0–10.5)
nRBC: 0 % (ref 0.0–0.2)

## 2023-08-05 LAB — URINALYSIS, ROUTINE W REFLEX MICROSCOPIC
Bacteria, UA: NONE SEEN
Bilirubin Urine: NEGATIVE
Glucose, UA: NEGATIVE mg/dL
Hgb urine dipstick: NEGATIVE
Ketones, ur: NEGATIVE mg/dL
Leukocytes,Ua: NEGATIVE
Nitrite: NEGATIVE
Specific Gravity, Urine: 1.034 — ABNORMAL HIGH (ref 1.005–1.030)
pH: 6 (ref 5.0–8.0)

## 2023-08-05 LAB — COMPREHENSIVE METABOLIC PANEL
ALT: 17 U/L (ref 0–44)
AST: 13 U/L — ABNORMAL LOW (ref 15–41)
Albumin: 4.9 g/dL (ref 3.5–5.0)
Alkaline Phosphatase: 91 U/L (ref 38–126)
Anion gap: 11 (ref 5–15)
BUN: 12 mg/dL (ref 6–20)
CO2: 27 mmol/L (ref 22–32)
Calcium: 9.3 mg/dL (ref 8.9–10.3)
Chloride: 100 mmol/L (ref 98–111)
Creatinine, Ser: 0.91 mg/dL (ref 0.44–1.00)
GFR, Estimated: >60 mL/min (ref >60)
Glucose, Bld: 92 mg/dL (ref 70–99)
Potassium: 3.4 mmol/L — ABNORMAL LOW (ref 3.5–5.1)
Sodium: 138 mmol/L (ref 135–145)
Total Bilirubin: 0.5 mg/dL (ref 0.0–1.2)
Total Protein: 8 g/dL (ref 6.5–8.1)

## 2023-08-05 LAB — PREGNANCY, URINE: Preg Test, Ur: NEGATIVE

## 2023-08-05 LAB — LIPASE, BLOOD: Lipase: 49 U/L (ref 11–51)

## 2023-08-05 MED ORDER — DICYCLOMINE HCL 10 MG PO CAPS
10.0000 mg | ORAL_CAPSULE | Freq: Once | ORAL | Status: AC
  Administered 2023-08-05: 10 mg via ORAL
  Filled 2023-08-05: qty 1

## 2023-08-05 MED ORDER — ONDANSETRON 4 MG PO TBDP
4.0000 mg | ORAL_TABLET | Freq: Once | ORAL | Status: AC | PRN
  Administered 2023-08-05: 4 mg via ORAL
  Filled 2023-08-05: qty 1

## 2023-08-05 MED ORDER — IOHEXOL 300 MG/ML  SOLN
100.0000 mL | Freq: Once | INTRAMUSCULAR | Status: AC | PRN
  Administered 2023-08-05: 100 mL via INTRAVENOUS

## 2023-08-05 NOTE — ED Triage Notes (Addendum)
 Pt c/o abd cramping pain that started this morning. Pt is concerned that this could be coming from an increase to her medication wegovy.  States she took tums, pepcid, without relief. C/o vomiting times 8 today. Denies diarrhea. Denies any sick contacts. Pt also states that she got a cortisone shot on Friday for her back pain.

## 2023-08-05 NOTE — ED Provider Notes (Signed)
 [facility] AT [facility] Provider Note   CSN: [id number] Arrival date & time: 08/05/23  2037     History  Chief Complaint  Patient presents with   Abdominal Pain    [name] Curry is a 43 y.o. female past medical history significant for hyperlipidemia and prediabetes presents today for abdominal cramping that began this morning.  Patient did recently increase her Wegovy.  Patient states that she took Tums and Pepcid without relief.  Patient endorses vomiting 8 times today.  Patient denies fever, chills, diarrhea, chest pain, shortness of breath, or sick contacts.  Abdominal Pain Associated symptoms: nausea and vomiting        Home Medications Prior to Admission medications   Medication Sig Start Date End Date Taking? Authorizing Provider  dicyclomine (BENTYL) 20 MG tablet Take 1 tablet (20 mg total) by mouth 2 (two) times daily. 08/06/23  Yes Dolphus Jenny, PA-C  ondansetron (ZOFRAN-ODT) 4 MG disintegrating tablet Take 1 tablet (4 mg total) by mouth every 8 (eight) hours as needed for nausea or vomiting. 08/06/23  Yes Dolphus Jenny, PA-C  levothyroxine (SYNTHROID) 112 MCG tablet Take 1 tablet (112 mcg total) by mouth daily before breakfast. 04/26/23   Carlus Pavlov, MD  metoprolol tartrate (LOPRESSOR) 25 MG tablet TAKE 1 TABLET BY MOUTH TWICE A DAY Patient taking differently: Take 25 mg by mouth daily as needed. 07/30/23   Tollie Eth, NP  naproxen sodium (ALEVE) 220 MG tablet Take 220 mg by mouth daily as needed.    [provider]  Semaglutide-Weight Management (WEGOVY) 0.25 MG/0.5ML SOAJ Inject 0.25 mg into the skin once a week. 04/30/23   Tollie Eth, NP  traMADol (ULTRAM) 50 MG tablet Take 1 tablet (50 mg total) by mouth every 12 (twelve) hours as needed. 07/25/23   Cammy Copa, MD      Allergies    Atenolol, Methimazole, Penicillins, and Sulfa antibiotics    Review of Systems   Review of Systems  Gastrointestinal:   Positive for abdominal pain, nausea and vomiting.    Physical Exam Updated Vital Signs BP (!) 144/88   Pulse 75   Temp 97.8 F (36.6 C)   Resp 18   LMP 06/27/2023 (Exact Date)   SpO2 98%  Physical Exam Vitals and nursing note reviewed.  Constitutional:      General: She is not in acute distress.    Appearance: She is well-developed.  HENT:     Head: Normocephalic and atraumatic.  Eyes:     Extraocular Movements: Extraocular movements intact.     Conjunctiva/sclera: Conjunctivae normal.  Cardiovascular:     Rate and Rhythm: Normal rate and regular rhythm.     Heart sounds: Normal heart sounds. No murmur heard. Pulmonary:     Effort: Pulmonary effort is normal. No respiratory distress.     Breath sounds: Normal breath sounds.  Abdominal:     General: Abdomen is flat. Bowel sounds are normal.     Palpations: Abdomen is soft.     Tenderness: There is generalized abdominal tenderness.  Musculoskeletal:        General: No swelling.     Cervical back: Neck supple.  Skin:    General: Skin is warm and dry.     Capillary Refill: Capillary refill takes less than 2 seconds.  Neurological:     General: No focal deficit present.     Mental Status: She is alert.  Psychiatric:  Mood and Affect: Mood normal.     ED Results / Procedures / Treatments   Labs (all labs ordered are listed, but only abnormal results are displayed) Labs Reviewed  COMPREHENSIVE METABOLIC PANEL - Abnormal; Notable for the following components:      Result Value   Potassium 3.4 (*)    AST 13 (*)    All other components within normal limits  CBC - Abnormal; Notable for the following components:   WBC 10.6 (*)    RBC 5.56 (*)    MCV 74.5 (*)    MCH 24.5 (*)    All other components within normal limits  URINALYSIS, ROUTINE W REFLEX MICROSCOPIC - Abnormal; Notable for the following components:   Specific Gravity, Urine 1.034 (*)    Protein, ur TRACE (*)    All other components within normal  limits  LIPASE, BLOOD  PREGNANCY, URINE    EKG None  Radiology CT ABDOMEN PELVIS W CONTRAST Result Date: 08/05/2023 CLINICAL DATA:  Abdominal cramping and vomiting. EXAM: CT ABDOMEN AND PELVIS WITH CONTRAST TECHNIQUE: Multidetector CT imaging of the abdomen and pelvis was performed using the standard protocol following bolus administration of intravenous contrast. RADIATION DOSE REDUCTION: This exam was performed according to the departmental dose-optimization program which includes automated exposure control, adjustment of the mA and/or kV according to patient size and/or use of iterative reconstruction technique. CONTRAST:  OMNIPAQUE IOHEXOL 300 MG/ML  SOLN COMPARISON:  None Available. FINDINGS: Lower chest: No acute abnormality. Hepatobiliary: No focal liver abnormality is seen. The gallbladder is distended without evidence of gallstones, gallbladder wall thickening, or biliary dilatation. Pancreas: Unremarkable. No pancreatic ductal dilatation or surrounding inflammatory changes. Spleen: Normal in size without focal abnormality. Adrenals/Urinary Tract: Adrenal glands are unremarkable. Kidneys are normal, without renal calculi, focal lesion, or hydronephrosis. The urinary bladder is poorly distended and subsequently limited in evaluation. Stomach/Bowel: Stomach is within normal limits. Appendix appears normal. No evidence of bowel wall thickening, distention, or inflammatory changes. Vascular/Lymphatic: No significant vascular findings are present. No enlarged abdominal or pelvic lymph nodes. Reproductive: A 3.1 cm cyst is seen within the right adnexa. The uterus and bilateral adnexa are unremarkable. Other: No abdominal wall hernia or abnormality. No abdominopelvic ascites. Musculoskeletal: No acute or significant osseous findings. IMPRESSION: 3.1 cm right adnexal cyst, likely ovarian in origin. No follow-up imaging is recommended. This recommendation follows ACR consensus guidelines: White Paper  of the ACR Incidental Findings Committee II on Adnexal Findings. J Am Coll Radiol [id number]. Electronically Signed   By: Aram Candela M.D.   On: 08/05/2023 23:56    Procedures Procedures    Medications Ordered in ED Medications  ondansetron (ZOFRAN-ODT) disintegrating tablet 4 mg (4 mg Oral Given 08/05/23 2059)  dicyclomine (BENTYL) capsule 10 mg (10 mg Oral Given 08/05/23 2309)  iohexol (OMNIPAQUE) 300 MG/ML solution 100 mL (100 mLs Intravenous Contrast Given 08/05/23 2347)    ED Course/ Medical Decision Making/ A&P                                 Medical Decision Making Amount and/or Complexity of Data Reviewed Labs: ordered.  Risk Prescription drug management.   This patient presents to the ED with chief complaint(s) of abdominal cramping and vomiting with pertinent past medical history of prediabetes and hyperlipidemia which further complicates the presenting complaint. The complaint involves an extensive differential diagnosis and also carries with it a high risk of complications  and morbidity.    The differential diagnosis includes SBO, pancreatitis, appendicitis, medication reaction, gastroenteritis  Additional history obtained: Records reviewed Care Everywhere/External Records  ED Course and Reassessment: Patient given Zofran and Bentyl while in ED  Independent labs interpretation:  The following labs were independently interpreted:  CBC: Mild leukocytosis at 10.6 CMP: Mild hypokalemia 3.4 Lipase: 49 Urine pregnancy: Negative UA: Trace protein, increased specific gravity  Independent visualization of imaging: - I independently visualized the following imaging with scope of interpretation limited to determining acute life threatening conditions related to emergency care: CT abdomen pelvis with contrast, which revealed 1 cm right adnexal cyst, likely ovarian in origin.  No follow-up imaging is recommended.  Consultation: - Consulted or discussed  management/test interpretation w/ external professional: None  Consideration for admission or further workup: Consider for mission or further workup however patient's vital signs, physical exam, labs, and imaging were reassuring.  Patient's symptoms likely due to increase in Wegovy dose.  Patient given outpatient course of Zofran as needed for nausea and Bentyl for abdominal cramping.  Patient to follow-up with PCP if her symptoms persist for further evaluation and treatment.        Final Clinical Impression(s) / ED Diagnoses Final diagnoses:  Abdominal pain, unspecified abdominal location  Nausea and vomiting, unspecified vomiting type    Rx / DC Orders ED Discharge Orders          Ordered    dicyclomine (BENTYL) 20 MG tablet  2 times daily        08/06/23 0002    ondansetron (ZOFRAN-ODT) 4 MG disintegrating tablet  Every 8 hours PRN        08/06/23 0002              Dolphus Jenny, PA-C 08/06/23 0002    Arby Barrette, MD 08/09/23 [phone]

## 2023-08-06 MED ORDER — ONDANSETRON 4 MG PO TBDP: 4.0000 mg | ORAL_TABLET | Freq: Three times a day (TID) | ORAL | 0 refills | Status: DC | PRN

## 2023-08-06 MED ORDER — DICYCLOMINE HCL 20 MG PO TABS
20.0000 mg | ORAL_TABLET | Freq: Two times a day (BID) | ORAL | 0 refills | Status: DC
Start: 2023-08-06 — End: 2023-08-06

## 2023-08-06 MED ORDER — DICYCLOMINE HCL 20 MG PO TABS
20.0000 mg | ORAL_TABLET | Freq: Two times a day (BID) | ORAL | 0 refills | Status: DC
Start: 2023-08-06 — End: 2023-10-26

## 2023-08-06 MED ORDER — ONDANSETRON 4 MG PO TBDP
4.0000 mg | ORAL_TABLET | Freq: Three times a day (TID) | ORAL | 0 refills | Status: DC | PRN
Start: 2023-08-06 — End: 2023-08-06

## 2023-08-06 NOTE — Discharge Instructions (Addendum)
 Today you are seen for abdominal pain with nausea and vomiting.  I suspect this is from your recent increase in [facility] dosage.  Please pick up your Zofran take as needed for nausea and Bentyl as needed for abdominal cramping.  Please follow-up with your primary care for further evaluation and treatment if your symptoms persist.  Thank you for letting us treat you today. After reviewing your labs and imaging, I feel you are safe to go home. Please follow up with your PCP in the next several days and provide them with your records from this visit. Return to the Emergency Room if pain becomes severe or symptoms worsen.

## 2023-08-07 DIAGNOSIS — K029 Dental caries, unspecified: Secondary | ICD-10-CM | POA: Diagnosis not present

## 2023-08-08 ENCOUNTER — Encounter: Payer: Medicaid Other | Admitting: Nurse Practitioner

## 2023-08-15 DIAGNOSIS — E782 Mixed hyperlipidemia: Secondary | ICD-10-CM | POA: Diagnosis not present

## 2023-08-15 DIAGNOSIS — Z713 Dietary counseling and surveillance: Secondary | ICD-10-CM | POA: Diagnosis not present

## 2023-08-15 DIAGNOSIS — E66812 Obesity, class 2: Secondary | ICD-10-CM | POA: Diagnosis not present

## 2023-08-15 DIAGNOSIS — R634 Abnormal weight loss: Secondary | ICD-10-CM | POA: Diagnosis not present

## 2023-08-15 DIAGNOSIS — Z6837 Body mass index (BMI) 37.0-37.9, adult: Secondary | ICD-10-CM | POA: Diagnosis not present

## 2023-08-21 ENCOUNTER — Encounter: Payer: Self-pay | Admitting: Nurse Practitioner

## 2023-08-21 ENCOUNTER — Other Ambulatory Visit: Payer: Self-pay

## 2023-08-21 DIAGNOSIS — J302 Other seasonal allergic rhinitis: Secondary | ICD-10-CM | POA: Diagnosis not present

## 2023-08-21 DIAGNOSIS — K0530 Chronic periodontitis, unspecified: Secondary | ICD-10-CM | POA: Diagnosis not present

## 2023-08-21 DIAGNOSIS — K0850 Unsatisfactory restoration of tooth, unspecified: Secondary | ICD-10-CM | POA: Diagnosis not present

## 2023-08-21 DIAGNOSIS — K029 Dental caries, unspecified: Secondary | ICD-10-CM | POA: Diagnosis not present

## 2023-08-21 MED ORDER — LORATADINE-PSEUDOEPHEDRINE ER 10-240 MG PO TB24: 1.0000 | ORAL_TABLET | Freq: Every day | ORAL | 5 refills | Status: DC

## 2023-09-01 DIAGNOSIS — Z419 Encounter for procedure for purposes other than remedying health state, unspecified: Secondary | ICD-10-CM | POA: Diagnosis not present

## 2023-09-10 ENCOUNTER — Encounter (HOSPITAL_BASED_OUTPATIENT_CLINIC_OR_DEPARTMENT_OTHER): Payer: Self-pay | Admitting: Obstetrics & Gynecology

## 2023-09-11 ENCOUNTER — Encounter: Payer: Self-pay | Admitting: Physical Therapy

## 2023-09-11 ENCOUNTER — Ambulatory Visit: Payer: Medicaid Other | Attending: Obstetrics & Gynecology | Admitting: Physical Therapy

## 2023-09-11 ENCOUNTER — Other Ambulatory Visit: Payer: Self-pay

## 2023-09-11 DIAGNOSIS — M6289 Other specified disorders of muscle: Secondary | ICD-10-CM | POA: Diagnosis not present

## 2023-09-11 DIAGNOSIS — R293 Abnormal posture: Secondary | ICD-10-CM | POA: Diagnosis not present

## 2023-09-11 DIAGNOSIS — R279 Unspecified lack of coordination: Secondary | ICD-10-CM | POA: Insufficient documentation

## 2023-09-11 DIAGNOSIS — M6281 Muscle weakness (generalized): Secondary | ICD-10-CM | POA: Diagnosis not present

## 2023-09-11 NOTE — Therapy (Signed)
 OUTPATIENT PHYSICAL THERAPY FEMALE PELVIC EVALUATION   Patient Name: [name] Curry MRN: [id number] DOB:[birth year], 43 y.o., female Today's Date: 09/11/2023  END OF SESSION:  PT End of Session - 09/11/23 1009     Visit Number 1    Number of Visits 12    Authorization Type Medicaid [employer]    Activity Tolerance Patient tolerated treatment well    Behavior During Therapy [facility] for tasks assessed/performed             Past Medical History:  Diagnosis Date   Anemia    Anxiety    Arthritis    Beta thalassemia trait    Depression    Gestational diabetes    Graves disease    Hidradenitis    Hypothyroidism 06/30/2016   Palpitations    Pregnancy induced hypertension    Suicidal behavior 08/05/2011   Broke a light bulb and tried to cut her wrists    Past Surgical History:  Procedure Laterality Date   COLPOSCOPY     EXCISIONAL HEMORRHOIDECTOMY  1996   HYDRADENITIS EXCISION Left 12/13/2021   Procedure: EXCISION HIDRADENITIS LEFT AXILLA;  Surgeon: Oza Blumenthal, MD;  Location: [facility];  Service: General;  Laterality: Left;   LAPAROSCOPIC BILATERAL SALPINGECTOMY Bilateral 02/14/2023   Procedure: LAPAROSCOPIC BILATERAL SALPINGECTOMY;  Surgeon: Lillian Rein, MD;  Location[facility];  Service: Gynecology;  Laterality: Bilateral;   THYROIDECTOMY N/A 06/30/2016   Procedure: TOTAL THYROIDECTOMY;  Surgeon: Oralee Billow, MD;  Location: [facility] OR;  Service: General;  Laterality: N/A;   TUBAL LIGATION Bilateral    Patient Active Problem List   Diagnosis Date Noted   Non-cardiac chest pain 05/03/2023   Pain, joint, pelvic region, left 05/03/2023   Penicillin allergy 07/27/2022   Bilateral carpal tunnel syndrome 06/19/2022   Muscle spasms of neck 02/06/2022   Beta thalassemia trait 10/07/2021   NSVT (nonsustained ventricular tachycardia) (HCC) 10/07/2021   Pre-diabetes 07/22/2021   Vitamin D  deficiency 07/22/2021   Mixed hyperlipidemia  07/22/2021   BMI 36.0-36.9,adult 07/14/2021   Family history of breast cancer in sister 10/22/2019   H/O Graves' disease 09/05/2016   Postsurgical hypothyroidism 12/01/2013    PCP: Annella Kief, NP (PCP)  REFERRING PROVIDER: Lillian Rein, MD   REFERRING DIAG: M62.89 (ICD-10-CM) - Pelvic floor dysfunction  THERAPY DIAG:  Muscle weakness (generalized)  Unspecified lack of coordination  Abnormal posture  Rationale for Evaluation and Treatment: Rehabilitation  ONSET DATE: 09/26/22  SUBJECTIVE:  SUBJECTIVE STATEMENT: Patient reports that she is experiencing pelvic pain that she has experienced since and before having her first child vaginally on 09/26/22. She had an MRI performed and she has inflammation in the pelvis. She is going to see a rheumatologist in August, so pelvic PT is her pain management source in the meantime until she learns more about her pain. Her pain flares and fluctuates, right now it is feeling okay (0/10) but can rise to 10/10 when flared. She also notices vaginal heaviness but does not see anything falling out.   PAIN:  Are you having pain? Yes NPRS scale: 10/10 - at worst  Pain location: Internal, Deep, and Anterior  Pain type: aching and dull Pain description: intermittent   Aggravating factors: stair climbing, walking, standing, sitting for longer 30 minutes  Relieving factors: cortizone shots, pain medication   PRECAUTIONS: None  RED FLAGS: None   WEIGHT BEARING RESTRICTIONS: No  FALLS:  Has patient fallen in last 6 months? No  OCCUPATION: works at home  ACTIVITY LEVEL : takes care of 2 month old baby, walks around neighborhood, planks and strength training exercises at home   PLOF: Independent  PATIENT GOALS: to decrease the pain or to find ways to manage  pain   PERTINENT HISTORY:  Hx: excisional hemorrhoidectomy 1996, LAPAROSCOPIC BILATERAL SALPINGECTOMY 02/14/23, bilateral tubal ligation Sexual abuse: No  BOWEL MOVEMENT: Pain with bowel movement: No Type of bowel movement:Type (Bristol Stool Scale) 4-5, Frequency 1-2x/day, Strain no, and Splinting no Fully empty rectum: Yes:   Leakage: No Pads: No Fiber supplement/laxative No  URINATION: Pain with urination: No Fully empty bladder: Yes:   Stream: Strong Urgency: Yes  Frequency: within normal limits  Leakage: Coughing and Sneezing Pads: No  INTERCOURSE:  Ability to have vaginal penetration Yes  Pain with intercourse: Deep Penetration DrynessNo Climax: yes Marinoff Scale: 1/3  PREGNANCY: Vaginal deliveries Sep 26, 2022 (only child)  Tearing No Episiotomy No C-section deliveries 0 Had cutting of cervix during delivery - tubal ligation following this   PROLAPSE: Pressure   OBJECTIVE:  Note: Objective measures were completed at Evaluation unless otherwise noted.  PATIENT SURVEYS:  PFIQ-7: 3  COGNITION: Overall cognitive status: Within functional limits for tasks assessed     SENSATION: Light touch: Appears intact  LUMBAR SPECIAL TESTS:  Single leg stance test: Positive  FUNCTIONAL TESTS:  Squat test: mild dynamic knee valgus with loading, generalized stiffness present in lumbopelvic region with squat test   GAIT: Assistive device utilized: None Comments: moderate trendelenburg gait pattern with ambulation   POSTURE: rounded shoulders, forward head, and flexed trunk    LUMBARAROM/PROM:   A/PROM A/PROM  eval  Flexion 25% limited  Extension WNL  Right lateral flexion WNL  Left lateral flexion WNL  Right rotation 25% limited  Left rotation 25% limited   (Blank rows = not tested)  LOWER EXTREMITY ROM: within functional limits   Active ROM Right eval Left eval  Hip flexion    Hip extension    Hip abduction    Hip adduction    Hip internal  rotation    Hip external rotation    Knee flexion    Knee extension    Ankle dorsiflexion    Ankle plantarflexion    Ankle inversion    Ankle eversion     (Blank rows = not tested)  LOWER EXTREMITY MMT:  MMT Right eval Left eval  Hip flexion 4- 4-  Hip extension 4 4  Hip abduction 4- 4-  Hip adduction 4- 4-  Hip internal rotation    Hip external rotation    Knee flexion    Knee extension    Ankle dorsiflexion    Ankle plantarflexion    Ankle inversion    Ankle eversion     (Blank rows = not tested) PALPATION:   General: no tenderness to palpation of posterior  lumbopelvic musculature in standing    Pelvic Alignment: within normal limits   Abdominal: upper chest breathing at rest, decreased rib excursion with inhalation, abdominal bracing at rest                 External Perineal Exam: external examination performed outside of patient's clothing today. Patient able to actively lengthen and shorten her pelvic floor in hooklying via external palpation. Slight lower quadrant pain bilaterally with contraction of pelvic floor.                              Internal Pelvic Floor: deferred due to patient's menstrual cycle - assess at follow up visit.  Patient confirms identification and approves PT to assess internal pelvic floor and treatment Yes No emotional/communication barriers or cognitive limitation. Patient is motivated to learn. Patient understands and agrees with treatment goals and plan. PT explains patient will be examined in standing, sitting, and lying down to see how their muscles and joints work. When they are ready, they will be asked to remove their underwear so PT can examine their perineum. The patient is also given the option of providing their own chaperone as one is not provided in our facility. The patient also has the right and is explained the right to defer or refuse any part of the evaluation or treatment including the internal exam. With the patient's  consent, PT will use one gloved finger to gently assess the muscles of the pelvic floor, seeing how well it contracts and relaxes and if there is muscle symmetry. After, the patient will get dressed and PT and patient will discuss exam findings and plan of care. PT and patient discuss plan of care, schedule, attendance policy and HEP activities.  PELVIC MMT:   MMT eval  Vaginal deferred due to patient's menstrual cycle - assess at follow up visit.  Internal Anal Sphincter   External Anal Sphincter   Puborectalis   Diastasis Recti   (Blank rows = not tested)        TONE: deferred due to patient's menstrual cycle - assess at follow up visit.  PROLAPSE: deferred due to patient's menstrual cycle - assess at follow up visit.  TODAY'S TREATMENT:                                                                                                                              DATE:   EVAL 09/11/23: Examination completed, findings reviewed, pt educated on POC. Pt motivated to participate in PT and agreeable to  attempt recommendations. Neuro re-ed: Hooklying diaphragmatic breathing + pelvic floor lengthening with inhalation and shortening with exhalation 2x10 Supine butterfly stretch + diaphragmatic breathing 2x8min  Supine lower trunk rotations + diaphragmatic breathing 2x62min   For all possible CPT codes, reference the Planned Interventions line above.     Check all conditions that are expected to impact treatment: {Conditions expected to impact treatment:None of these apply   If treatment provided at initial evaluation, no treatment charged due to lack of authorization.     PATIENT EDUCATION:  Education details: relative anatomy and the connection between the diaphragm and pelvic floor, pelvic floor active range of motion and how to achieve this with breathing techniques  Person educated: Patient Education method: Explanation, Demonstration, Tactile cues, Verbal cues, and Handouts Education  comprehension: verbalized understanding, returned demonstration, verbal cues required, tactile cues required, and needs further education  HOME EXERCISE PROGRAM: Access Code: [email or web address] URL: [email or web address] Date: 09/11/2023 Prepared by: Robbin Chill  Exercises - Supine Pelvic Floor Contraction  - 1 x daily - 7 x weekly - 2 sets - 10 reps - Supine Butterfly Groin Stretch  - 1 x daily - 7 x weekly - 2 sets - hold - Supine Lower Trunk Rotation  - 1 x daily - 7 x weekly - 2 sets - hold  ASSESSMENT:  CLINICAL IMPRESSION: Patient is a 43 y.o. female  who was seen today for physical therapy evaluation and treatment for pelvic floor dysfunction. Patient had her first child vaginally on 09/26/22, and since having child/before having child she has experienced bilateral lower quadrant pain flares that come and go. This pain can increase to a 10/10 at worst. Patient is currently on her menstrual cycle, so internal examination deferred until follow up visit. External examination performed outside of patient's clothing today. Patient able to actively lengthen and shorten her pelvic floor in hooklying via external palpation. Slight lower quadrant pain bilaterally with contraction of pelvic floor. Patient tends to upper chest breathe and does not co-contract her diaphragm and pelvic floor when breathing in hooklying. Patient provided with downtraining stretches to help address lumbopelvic stiffness. No increase in pain following today's session and Pt would benefit from additional PT to further address deficits.    OBJECTIVE IMPAIRMENTS: decreased coordination, decreased endurance, decreased mobility, decreased ROM, decreased strength, and pain.   ACTIVITY LIMITATIONS: continence  PARTICIPATION LIMITATIONS: cleaning, laundry, and driving  PERSONAL FACTORS: Age, Past/current experiences, and Time since onset of injury/illness/exacerbation are also affecting patient's functional  outcome.   REHAB POTENTIAL: Good  CLINICAL DECISION MAKING: Stable/uncomplicated  EVALUATION COMPLEXITY: Low   GOALS: Goals reviewed with patient? Yes  SHORT TERM GOALS: Target date: 10/09/2023  Pt will be independent with HEP.  Baseline: Goal status: INITIAL  2.  Pt will be independent with diaphragmatic breathing and down training activities in order to improve pelvic floor relaxation.  Baseline:  Goal status: INITIAL  3.  Pt will report 50% reduction of pain due to improvements in posture, strength, and muscle length to allow patient to perform functional tasks like sitting in a car for 30 minutes without pain being a limiting factor. Baseline:  Goal status: INITIAL  LONG TERM GOALS: Target date: 03/12/2024  Pt will be independent with advanced HEP.  Baseline:  Goal status: INITIAL  2.  Pt will be independent with the knack, urge suppression technique, and double voiding in order to improve bladder habits and decrease urinary incontinence.   Baseline:  Goal status: INITIAL  3. Pt  to demonstrate improved coordination of pelvic floor and breathing mechanics with 10# squat with appropriate synergistic patterns to decrease pain and leakage at least 75% of the time for improved ability to complete a 30 minute workout without strain at pelvic floor and symptoms.    Baseline:  Goal status: INITIAL  4.  Pt will report 0/10 pain with vaginal penetration in order to improve intimate relationship with partner and quality of life.    Baseline:  Goal status: INITIAL  PLAN:  PT FREQUENCY: 1-2x/week  PT DURATION: 12 weeks  PLANNED INTERVENTIONS: 97110-Therapeutic exercises, 97530- Therapeutic activity, 97112- Neuromuscular re-education, 97535- Self Care, 16109- Manual therapy, Taping, Dry Needling, Joint mobilization, Spinal mobilization, Scar mobilization, Cryotherapy, and Moist heat  PLAN FOR NEXT SESSION: internal vaginal examination to assess pelvic floor AROM, introduction  to hip and core strengthening, assess diastasis rectus abdominis   Marni Sins, PT 09/11/2023, 12:34 PM

## 2023-09-12 ENCOUNTER — Other Ambulatory Visit (HOSPITAL_BASED_OUTPATIENT_CLINIC_OR_DEPARTMENT_OTHER): Payer: Self-pay | Admitting: Obstetrics & Gynecology

## 2023-09-12 IMAGING — CR DG CHEST 2V
2 series · 2 of 2 positions shown · non-contrast
Comparison: Chest x-ray 07/21/2016.  CT chest 07/22/2016.

CLINICAL DATA: Chest pain.

EXAM:
CHEST - 2 VIEW

[chest pa]
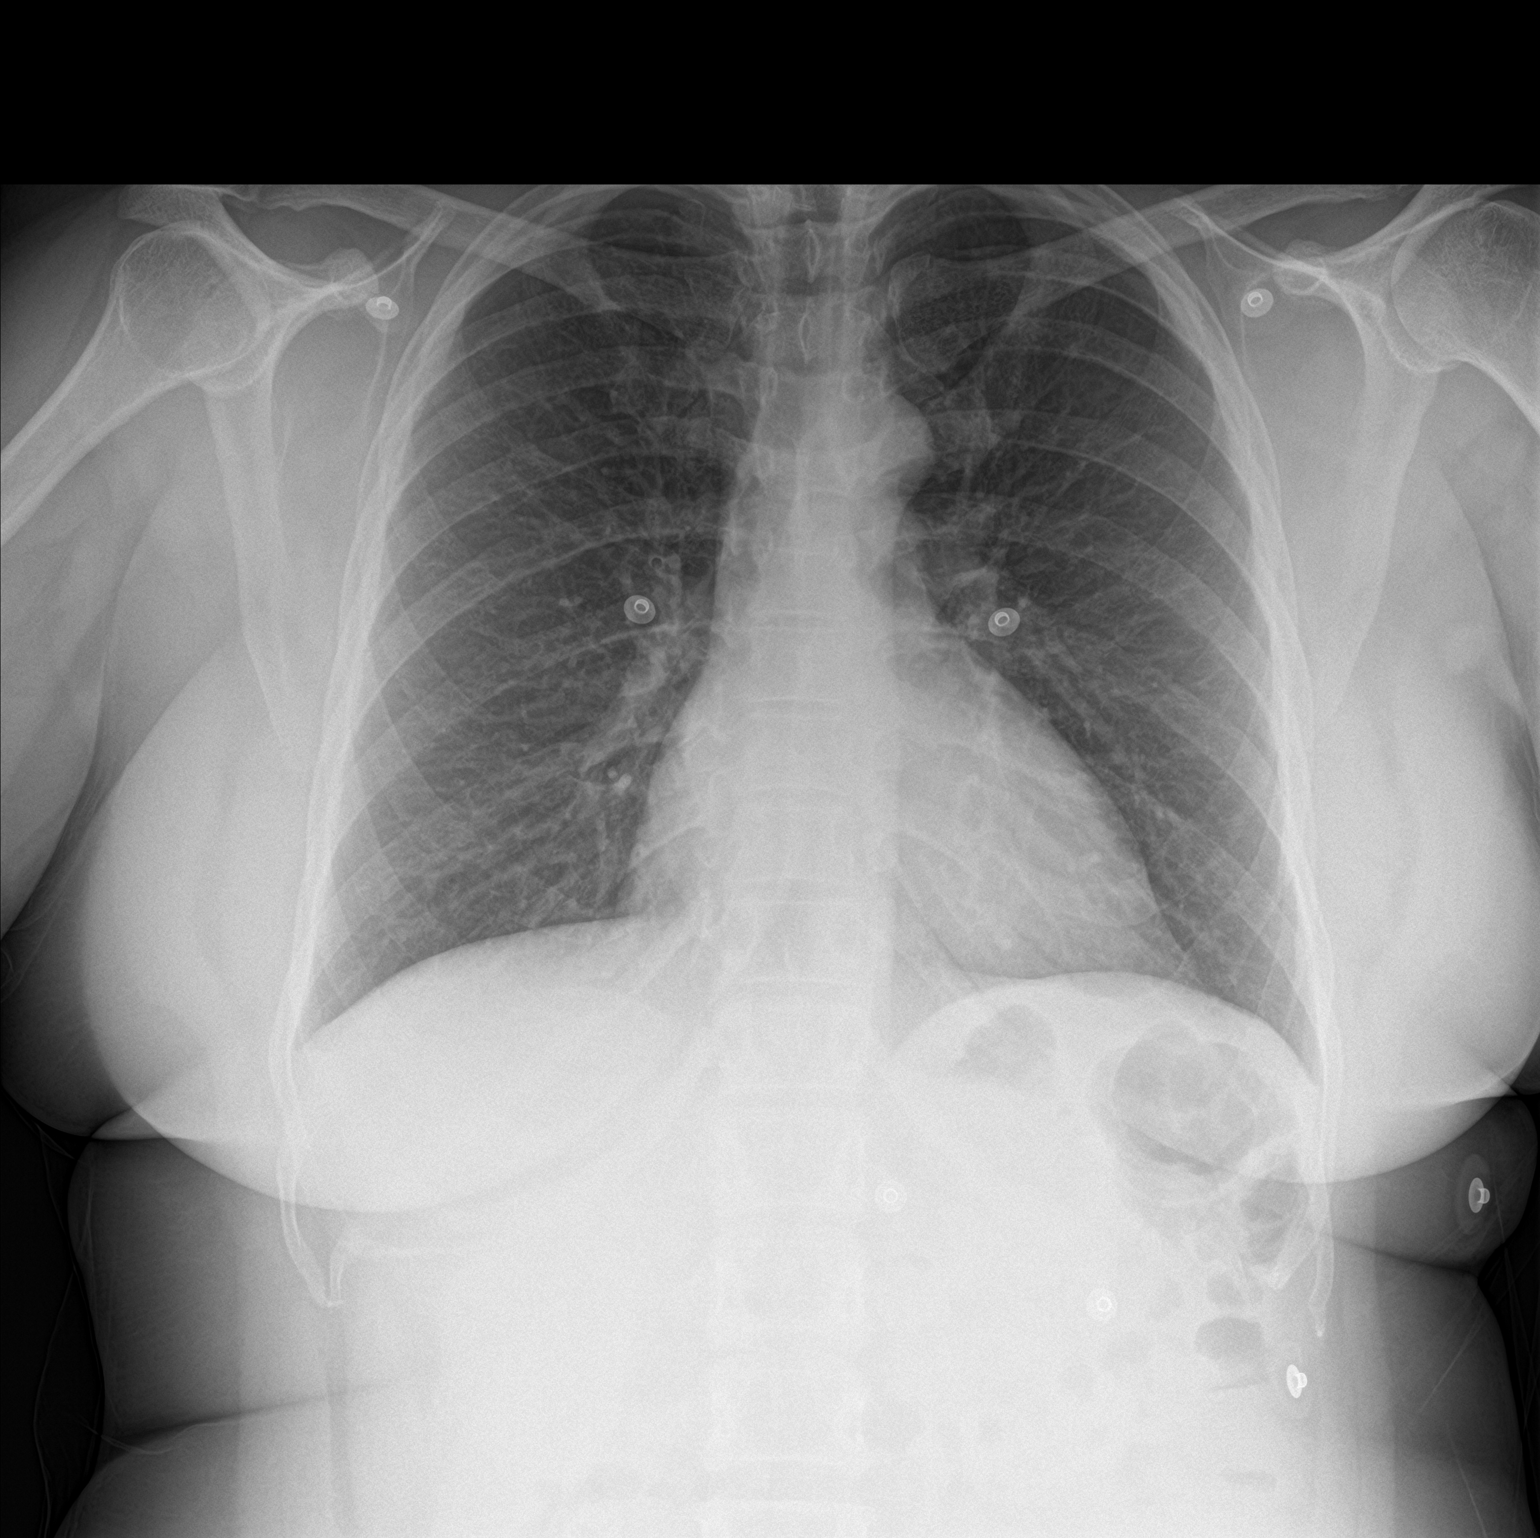

[chest lat]
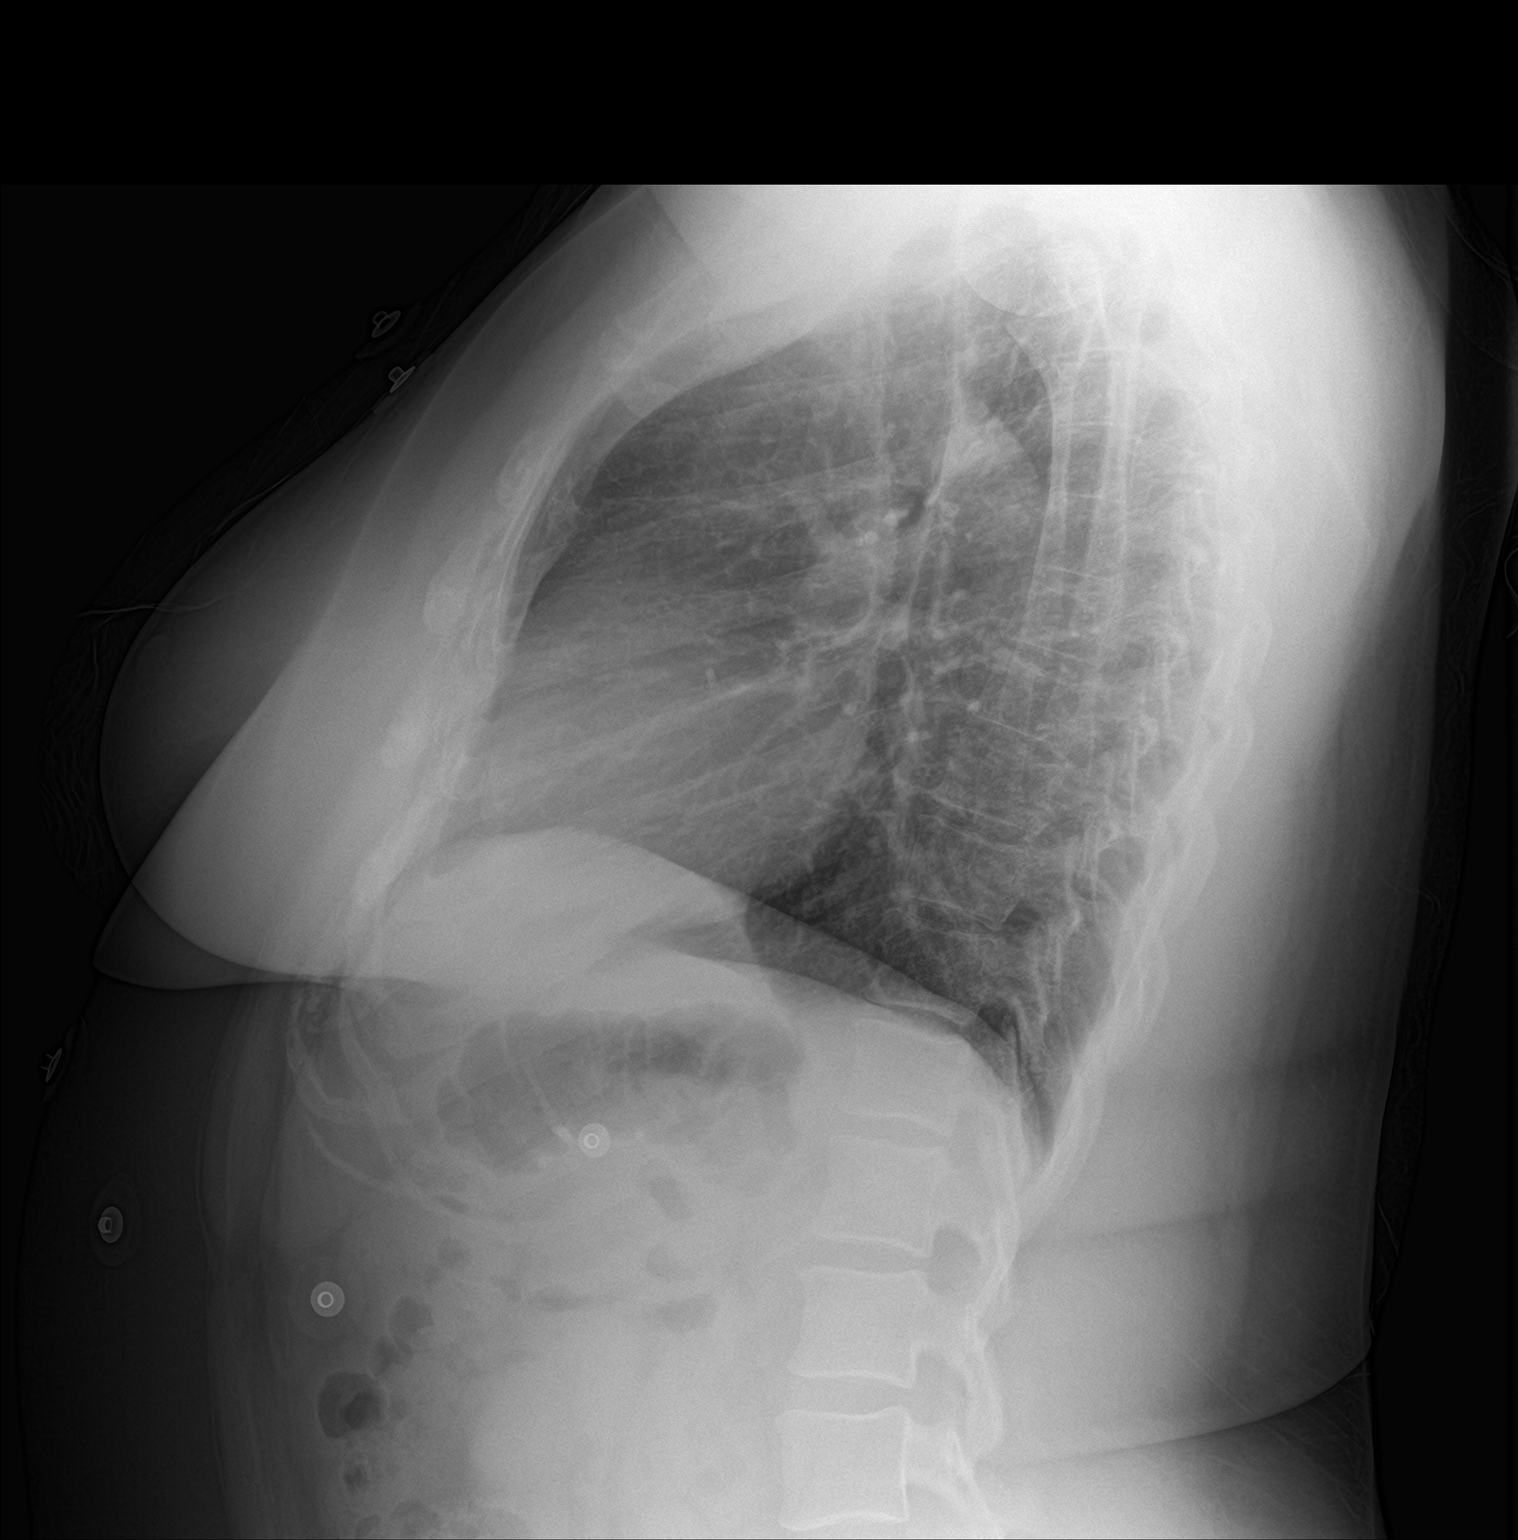

[2 of 2 positions shown; findings below may reference images not displayed]

FINDINGS: The heart size and mediastinal contours are within normal limits.
Both lungs are clear. The visualized skeletal structures are
unremarkable. There are surgical clips in the neck.
IMPRESSION: No active cardiopulmonary disease.

## 2023-09-12 MED ORDER — NORETHINDRONE ACETATE 5 MG PO TABS: 5.0000 mg | ORAL_TABLET | Freq: Two times a day (BID) | ORAL | 0 refills | Status: DC

## 2023-09-19 DIAGNOSIS — E782 Mixed hyperlipidemia: Secondary | ICD-10-CM | POA: Diagnosis not present

## 2023-09-19 DIAGNOSIS — R634 Abnormal weight loss: Secondary | ICD-10-CM | POA: Diagnosis not present

## 2023-09-19 DIAGNOSIS — E66812 Obesity, class 2: Secondary | ICD-10-CM | POA: Diagnosis not present

## 2023-09-19 DIAGNOSIS — Z6837 Body mass index (BMI) 37.0-37.9, adult: Secondary | ICD-10-CM | POA: Diagnosis not present

## 2023-09-25 DIAGNOSIS — K029 Dental caries, unspecified: Secondary | ICD-10-CM | POA: Diagnosis not present

## 2023-10-01 ENCOUNTER — Other Ambulatory Visit (HOSPITAL_BASED_OUTPATIENT_CLINIC_OR_DEPARTMENT_OTHER): Payer: Self-pay | Admitting: Obstetrics & Gynecology

## 2023-10-01 DIAGNOSIS — Z419 Encounter for procedure for purposes other than remedying health state, unspecified: Secondary | ICD-10-CM | POA: Diagnosis not present

## 2023-10-03 ENCOUNTER — Encounter (HOSPITAL_BASED_OUTPATIENT_CLINIC_OR_DEPARTMENT_OTHER): Payer: Self-pay | Admitting: Obstetrics & Gynecology

## 2023-10-03 ENCOUNTER — Other Ambulatory Visit (HOSPITAL_COMMUNITY)
Admission: RE | Admit: 2023-10-03 | Discharge: 2023-10-03 | Disposition: A | Discharge status: Home / Self Care | Source: Ambulatory Visit | Attending: Obstetrics & Gynecology | Admitting: Obstetrics & Gynecology

## 2023-10-03 ENCOUNTER — Ambulatory Visit (HOSPITAL_BASED_OUTPATIENT_CLINIC_OR_DEPARTMENT_OTHER): Admitting: Obstetrics & Gynecology

## 2023-10-03 VITALS — BP 108/81 | HR 115 | Ht 63.0 in | Wt 208.0 lb

## 2023-10-03 DIAGNOSIS — N939 Abnormal uterine and vaginal bleeding, unspecified: Secondary | ICD-10-CM | POA: Diagnosis not present

## 2023-10-03 DIAGNOSIS — N921 Excessive and frequent menstruation with irregular cycle: Secondary | ICD-10-CM | POA: Insufficient documentation
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
## 2023-10-04 ENCOUNTER — Encounter: Payer: Self-pay | Admitting: Nurse Practitioner

## 2023-10-04 DIAGNOSIS — R0602 Shortness of breath: Secondary | ICD-10-CM | POA: Diagnosis not present

## 2023-10-04 DIAGNOSIS — J069 Acute upper respiratory infection, unspecified: Secondary | ICD-10-CM

## 2023-10-04 DIAGNOSIS — R051 Acute cough: Secondary | ICD-10-CM | POA: Diagnosis not present

## 2023-10-04 DIAGNOSIS — J029 Acute pharyngitis, unspecified: Secondary | ICD-10-CM | POA: Diagnosis not present

## 2023-10-04 DIAGNOSIS — Z20822 Contact with and (suspected) exposure to covid-19: Secondary | ICD-10-CM | POA: Diagnosis not present

## 2023-10-04 HISTORY — DX: Acute upper respiratory infection, unspecified: J06.9

## 2023-10-04 LAB — CBC
Hematocrit: 42.2 % (ref 34.0–46.6)
Hemoglobin: 13.4 g/dL (ref 11.1–15.9)
MCH: 25 pg — ABNORMAL LOW (ref 26.6–33.0)
MCHC: 31.8 g/dL (ref 31.5–35.7)
MCV: 79 fL (ref 79–97)
Platelets: 384 10*3/uL (ref 150–450)
RBC: 5.35 x10E6/uL — ABNORMAL HIGH (ref 3.77–5.28)
RDW: 16.1 % — ABNORMAL HIGH (ref 11.7–15.4)
WBC: 14.7 10*3/uL — ABNORMAL HIGH (ref 3.4–10.8)

## 2023-10-04 LAB — IRON,TIBC AND FERRITIN PANEL
Ferritin: 132 ng/mL (ref 15–150)
Iron Saturation: 3 % — CL (ref 15–55)
Iron: 12 ug/dL — ABNORMAL LOW (ref 27–159)
Total Iron Binding Capacity: 390 ug/dL (ref 250–450)
UIBC: 378 ug/dL (ref 131–425)

## 2023-10-05 ENCOUNTER — Encounter (HOSPITAL_BASED_OUTPATIENT_CLINIC_OR_DEPARTMENT_OTHER): Payer: Self-pay | Admitting: Obstetrics & Gynecology

## 2023-10-05 ENCOUNTER — Encounter (HOSPITAL_BASED_OUTPATIENT_CLINIC_OR_DEPARTMENT_OTHER): Payer: Self-pay

## 2023-10-09 LAB — SURGICAL PATHOLOGY

## 2023-10-10 ENCOUNTER — Other Ambulatory Visit (HOSPITAL_BASED_OUTPATIENT_CLINIC_OR_DEPARTMENT_OTHER): Payer: Self-pay | Admitting: Obstetrics & Gynecology

## 2023-10-10 ENCOUNTER — Encounter (HOSPITAL_COMMUNITY): Payer: Self-pay | Admitting: Obstetrics & Gynecology

## 2023-10-10 MED ORDER — DOXYCYCLINE HYCLATE 100 MG PO CAPS: 100.0000 mg | ORAL_CAPSULE | Freq: Two times a day (BID) | ORAL | 0 refills | Status: DC

## 2023-10-10 NOTE — Progress Notes (Signed)
 Spoke w/ via phone for pre-op interview---[name] Curry needs dos---- UPT per anesthesia. Surgeon orders requested 10/10/23.        Curry results------ Current EKG in Epic dated 04/26/23. COVID test -----patient states asymptomatic no test needed Arrive at -------1100 NPO after MN NO Solid Food.  Clear liquids from MN until---1000 Pre-Surgery Ensure or G2:  Med rec completed Medications to take morning of surgery -----Levothyroxine , Bring Albuterol  inhaler, Metoprolol  PRN Diabetic medication -----  GLP1 agonist last dose: Wegovy  1.7mg  last dose 09/19/23. GLP1 instructions: No doses of Wegovy  until after surgery, patient verbalized understanding.  Patient instructed no nail polish to be worn day of surgery Patient instructed to bring photo id and insurance card day of surgery Patient aware to have Driver (ride ) / caregiver    for 24 hours after surgery - Husband [name] Curry Patient Special Instructions ----- Shower with antibacterial soap. Pre-Op special Instructions -----  Patient verbalized understanding of instructions that were given at this phone interview. Patient denies chest pain, sob, fever, cough at the interview.

## 2023-10-12 ENCOUNTER — Ambulatory Visit (HOSPITAL_COMMUNITY): Admission: RE | Admit: 2023-10-12 | Source: Home / Self Care | Admitting: Obstetrics & Gynecology

## 2023-10-12 ENCOUNTER — Encounter (HOSPITAL_COMMUNITY): Admission: RE | Payer: Self-pay | Source: Home / Self Care

## 2023-10-12 DIAGNOSIS — Z01818 Encounter for other preprocedural examination: Secondary | ICD-10-CM

## 2023-10-12 IMAGING — DX DG CHEST 1V PORT
1 series · 1 of 1 positions shown · non-contrast
Comparison: Chest x-ray 03/21/2021.

CLINICAL DATA: 40-year-old female with history of chest pain.

EXAM:
PORTABLE CHEST 1 VIEW

[chest]
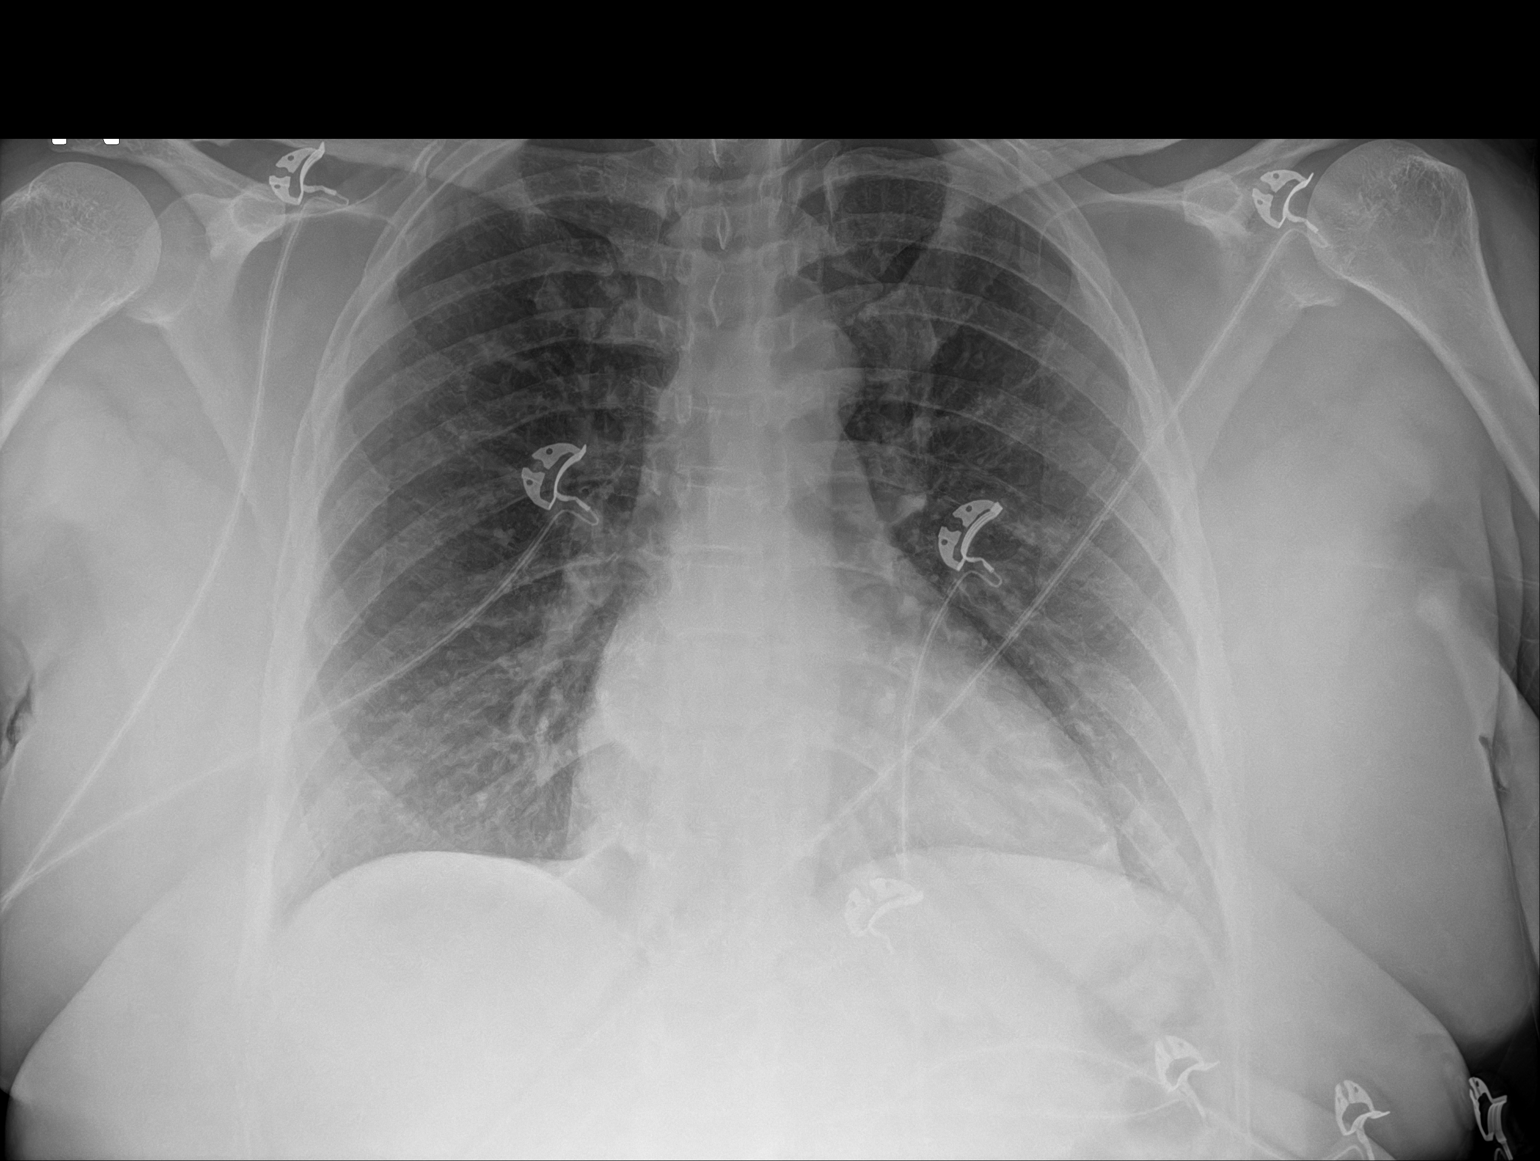

[1 of 1 positions shown; findings below may reference images not displayed]

FINDINGS: Lung volumes are normal. No consolidative airspace disease. No
pleural effusions. No pneumothorax. No pulmonary nodule or mass
noted. Pulmonary vasculature and the cardiomediastinal silhouette
are within normal limits.
IMPRESSION: No radiographic evidence of acute cardiopulmonary disease.

## 2023-10-12 SURGERY — ABLATION, ENDOMETRIUM, HYSTEROSCOPIC
Anesthesia: Choice

## 2023-10-18 ENCOUNTER — Encounter (HOSPITAL_BASED_OUTPATIENT_CLINIC_OR_DEPARTMENT_OTHER): Payer: Self-pay

## 2023-10-18 ENCOUNTER — Encounter (HOSPITAL_COMMUNITY): Payer: Self-pay | Admitting: Obstetrics & Gynecology

## 2023-10-18 NOTE — Progress Notes (Addendum)
 Spoke w/ via phone for pre-op interview--- pt Lab needs dos----      urine preg (per anesthesia)   Lab results------ no COVID test ----  see below Arrive at -------  1145 on 10-19-2023 NPO after MN NO Solid Food.  Clear liquids from MN until---  1045 Pre-Surgery Ensure or G2: n/a  Med rec completed Medications to take morning of surgery -----  synthroid ,  norethindrone ,  lopressor  If needed Diabetic medication ----- n/a  GLP1 agonist last dose:  pt stated last does 09-19-2023   GLP1 instructions:  pt stated has not done wegovy  since then due to knowing she would be having surgery  Patient instructed no nail polish to be worn day of surgery Patient instructed to bring photo id and insurance card day of surgery Patient aware to have Driver (ride ) / caregiver    for 24 hours after surgery -  husband, jamel hones Patient Special Instructions -----  asked to bring rescue inhaler dos Pre-Op special Instructions ----- patient was late add-on.  Pre-op orders pending  Patient verbalized understanding of instructions that were given at this phone interview. Patient denies chest pain, sob, fever, cough at the interview.   Anesthesia review:   Patient had UC office visit on 10-04-2023 (note in epic)  for flu-like symptoms for productive cough, sob, and sore throat.  Diagnosis Acute Pharyngitis, Acute Cough, and SOB.  Patient was given doxycycline.  Pt stated today she had 2 days left.  Also, stated last symptoms were 5 days ago but when asked last used albuterol  inhaler she stated 3 days ago (10-15-2023).  Will review chart with anesthesia due to URI guidelines. Chart review w/ anesthesia , Dr Glenetta Lane MDA.  Dr Glenetta Lane MDA stated pt will need to reschedule 4 weeks from URI diagnosis per guidelines and be 2 weeks asymptomatic. Called and left message w/ OR scheduler for Dr Melven Stable. Annabell Key, Jonne Netters.  Informed her of patient needing to be rescheduled 4 weeks from URI diagnosis and be asymptomatic 2 weeks  this would be after 11-01-2023. Will call patient in the morning and let her know which I told I would do and I had let patient know this was a possibility due to high risk laryngeal spasms, patient had verbalized understanding.

## 2023-10-18 NOTE — Anesthesia Preprocedure Evaluation (Signed)
 Anesthesia Evaluation    Reviewed: Allergy & Precautions, Patient's Chart, lab work & pertinent test results  Airway        Dental   Pulmonary asthma , former smoker 10/04/2023 URI          Cardiovascular hypertension, Pt. on medications and Pt. on home beta blockers      Neuro/Psych   Anxiety Depression       GI/Hepatic   Endo/Other  diabetesHypothyroidism  On wegovy   Renal/GU   Female GU complaint Menorrhagia     Musculoskeletal  (+) Arthritis ,    Abdominal  (+) + obese  Peds  Hematology   Anesthesia Other Findings All: Pcn, sulfa, atenolol , methimazole   Reproductive/Obstetrics                             Anesthesia Physical Anesthesia Plan Anesthesia Quick Evaluation

## 2023-10-19 ENCOUNTER — Other Ambulatory Visit (HOSPITAL_BASED_OUTPATIENT_CLINIC_OR_DEPARTMENT_OTHER): Payer: Self-pay | Admitting: Obstetrics & Gynecology

## 2023-10-19 ENCOUNTER — Telehealth (HOSPITAL_BASED_OUTPATIENT_CLINIC_OR_DEPARTMENT_OTHER): Payer: Self-pay | Admitting: Obstetrics & Gynecology

## 2023-10-19 ENCOUNTER — Encounter (HOSPITAL_COMMUNITY): Payer: Self-pay | Admitting: Vascular Surgery

## 2023-10-19 ENCOUNTER — Ambulatory Visit (HOSPITAL_COMMUNITY): Admission: RE | Admit: 2023-10-19 | Source: Home / Self Care | Admitting: Obstetrics & Gynecology

## 2023-10-19 DIAGNOSIS — Z01818 Encounter for other preprocedural examination: Secondary | ICD-10-CM

## 2023-10-19 DIAGNOSIS — N921 Excessive and frequent menstruation with irregular cycle: Secondary | ICD-10-CM

## 2023-10-19 HISTORY — DX: Personal history of other complications of pregnancy, childbirth and the puerperium: Z87.59

## 2023-10-19 HISTORY — DX: Palpitations: R00.2

## 2023-10-19 HISTORY — DX: Excessive and frequent menstruation with irregular cycle: N92.1

## 2023-10-19 HISTORY — DX: Mixed hyperlipidemia: E78.2

## 2023-10-19 HISTORY — DX: Other ventricular tachycardia: I47.29

## 2023-10-19 HISTORY — DX: Personal history of gestational diabetes: Z86.32

## 2023-10-19 SURGERY — ABLATION, ENDOMETRIUM, HYSTEROSCOPIC
Anesthesia: Choice

## 2023-10-19 NOTE — Telephone Encounter (Signed)
 Pt was called on 5/29 after procedure scheduled.  Procedure reviewed.  Pt ready to stop bleeding.  This morning, I was called by the OR informing me the surgery was cancelled due to pt having recent URI with symptoms still present up until 5 days ago.  Spoke with Dr. Lasalle Pointer about this.  Unless this is an emergency, he would recommend postponing.  Called pt and discussed.  She is disappointed but understands.  Will try to get scheduled in another week or so.  Communicated with Dejuana Bigelow about cancelling procedure as well.

## 2023-10-21 ENCOUNTER — Other Ambulatory Visit: Payer: Self-pay | Admitting: Internal Medicine

## 2023-10-22 DIAGNOSIS — E66812 Obesity, class 2: Secondary | ICD-10-CM | POA: Diagnosis not present

## 2023-10-22 DIAGNOSIS — E782 Mixed hyperlipidemia: Secondary | ICD-10-CM | POA: Diagnosis not present

## 2023-10-22 DIAGNOSIS — Z6836 Body mass index (BMI) 36.0-36.9, adult: Secondary | ICD-10-CM | POA: Diagnosis not present

## 2023-10-26 ENCOUNTER — Ambulatory Visit (INDEPENDENT_AMBULATORY_CARE_PROVIDER_SITE_OTHER): Admitting: Internal Medicine

## 2023-10-26 ENCOUNTER — Ambulatory Visit (HOSPITAL_BASED_OUTPATIENT_CLINIC_OR_DEPARTMENT_OTHER): Payer: Self-pay | Admitting: Obstetrics & Gynecology

## 2023-10-26 ENCOUNTER — Encounter: Payer: Self-pay | Admitting: Internal Medicine

## 2023-10-26 ENCOUNTER — Other Ambulatory Visit (HOSPITAL_BASED_OUTPATIENT_CLINIC_OR_DEPARTMENT_OTHER): Payer: Self-pay | Admitting: Certified Nurse Midwife

## 2023-10-26 ENCOUNTER — Encounter (HOSPITAL_COMMUNITY): Payer: Self-pay | Admitting: Obstetrics & Gynecology

## 2023-10-26 VITALS — BP 120/70 | HR 90 | Ht 63.0 in | Wt 215.2 lb

## 2023-10-26 DIAGNOSIS — E890 Postprocedural hypothyroidism: Secondary | ICD-10-CM

## 2023-10-26 DIAGNOSIS — Z8639 Personal history of other endocrine, nutritional and metabolic disease: Secondary | ICD-10-CM

## 2023-10-26 MED ORDER — LEVOTHYROXINE SODIUM 112 MCG PO TABS: 112.0000 ug | ORAL_TABLET | Freq: Every day | ORAL | 3 refills | Status: DC

## 2023-10-26 NOTE — Patient Instructions (Addendum)
Please continue Levothyroxine 112 mcg daily.  Take the thyroid hormone every day, with water, at least 30 minutes before breakfast, separated by at least 4 hours from: - acid reflux medications - calcium - iron - multivitamins  Please stop at the lab.  Please come back for a follow-up appointment in 6 months. 

## 2023-10-26 NOTE — Progress Notes (Signed)
 Spoke w/ via phone for pre-op interview--- [name] Curry Lab needs dos---- UPT, CBC and T&S per surgeon. BMP (gent) anesthesia.        Lab results------EKG current dated 04/25/24. COVID test -----patient states asymptomatic no test needed Arrive at -------1130 NPO after MN NO Solid Food.  Clear liquids from MN until---1030 Pre-Surgery Ensure or G2:  Med rec completed Medications to take morning of surgery -----Levothyroxine , Norethindrone , Metoprolol -PRN and bring Albuterol  inhaler.  Diabetic medication -----  GLP1 agonist last dose: Wegovy  1.7mg  last dose 09/19/23. GLP1 instructions: Hold any further doses until after surgery.  Patient instructed no nail polish to be worn day of surgery Patient instructed to bring photo id and insurance card day of surgery Patient aware to have Driver (ride ) / caregiver    for 24 hours after surgery - Husband [name] Curry Patient Special Instructions ----- Shower with antibacterial soap. Pre-Op special Instructions -----  Patient verbalized understanding of instructions that were given at this phone interview. Patient denies chest pain, sob, fever, cough at the interview.

## 2023-10-26 NOTE — Addendum Note (Signed)
 Addended by: Emilie Harden on: 10/26/2023 05:11 PM   Modules accepted: Orders

## 2023-10-26 NOTE — Progress Notes (Addendum)
 Patient ID: [name] Curry, female   DOB: 1980-11-16, 43 y.o.   MRN: [id number]  HPI  [name] Curry is a 43 y.o.-year-old female, initially referred by Dr. 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 Last visit 1 year and 4 months ago.  Interim history: Since last visit, she gave birth to a healthy girl. After pregnancy, she had bilateral salpingectomy 02/14/2023 and  will have uterine ablation.  She also has hip pain and sacroiliitis. On Wegovy  - now off for the surgery. She had a low vitamin D  recently >> started vitamin D  - off now but plans to restart.  Reviewed history: In 05/2013, she was seen in the ED for a peritonsillar abscess recently and was also found to have a goiter and thyrotoxicosis. Pt was started on MMI 10 mg bid and referred to me.  She developed hives on this.   She was also on Atenolol  25 mg daily >> stopped because of muscle cramps.  We checked an Uptake and scan that showed MNG and most likely Graves ds.  She stopped MMI 2 weeks prior to RAI tx, on 08/22/2013.  She became hypothyroid afterwards >> started Levothyroxine  75 mcg daily, but her dose was gradually decreased and then stopped last summer as she became thyrotoxic again. Unfortunately, she was lost for f/u afterwards as she lost her insurance.  She returned for a follow-up in 04/2016 >> was feeling poorly and TFTs were thyrotoxic >> I referred her to surgery and she had total thyroidectomy on 06/30/2016 with Dr. Sofia Curry.  She had some neck pain after surgery, now resolved.  We started levothyroxine  soon after surgery.  We initially started 125 mcg daily, but had to decrease the dose to 100 mcg daily and then to 88 mcg daily.   In 09/2021, TSH was suppressed so we decreased the dose of levothyroxine  to 88 mcg 6/7 days.  After pregnancy detected, we the dose to 75 mcg levothyroxine  9/7 days -after pregnancy detected.  In  02/2022, we changed the dose to 88 mcg levothyroxine  9/7 days during pregnancy.  Since last visit, we adjusted the dose of LT4 up, currently on 112 mcg daily, increased 04/2023  She takes this: - in am - fasting - at least 30 min from b'fast - no Ca, Fe, PPIs - off prenatal MVIs - off Biotin 5000 mcg   Latest TSH was normal: 07/04/2023: TSH 0.464 Lab Results  Component Value Date   TSH 9.330 (H) 04/24/2023   TSH 1.90 01/08/2023   TSH 1.38 09/13/2022   TSH 3.44 08/01/2022   TSH 3.34 07/03/2022   TSH 2.88 02/27/2022   TSH 4.27 12/12/2021   TSH 0.18 (L) 10/04/2021   TSH 1.110 07/14/2021   TSH 1.852 04/20/2021   FREET4 0.99 04/24/2023   FREET4 0.79 01/08/2023   FREET4 0.56 (L) 08/01/2022   FREET4 0.60 07/03/2022   FREET4 0.70 02/27/2022   FREET4 0.97 12/12/2021   FREET4 0.99 10/04/2021   FREET4 1.00 04/20/2021   FREET4 0.99 09/28/2020   FREET4 0.96 09/29/2019   01/13/2020: TSH 0.615  Her TSI antibodies were initially elevated but they became undetectably low: Lab Results  Component Value Date   TSI <89 07/03/2022   TSI <89 02/27/2018   TSI >700 (H) 05/01/2016   Pt denies: - feeling nodules in neck - hoarseness - dysphagia - choking  No FH of thyroid  cancer. No h/o radiation tx to head or neck. No herbal supplements.  No Biotin use. No recent steroids use.   She also has mild prediabetes-per PCP: 07/04/2023: HbA1c 6.0% Lab Results  Component Value Date   HGBA1C 6.0 (H) 04/24/2023   HGBA1C 6.0 07/03/2022   HGBA1C 6.0 02/27/2022   HGBA1C 5.8 10/04/2021   HGBA1C 5.9 (H) 07/14/2021   She had 2 OGTT's and I was able to review since last visit: normal: Component     Latest Ref Rng 05/04/2022 06/30/2022  Glucose, Fasting     70 - 91 mg/dL 79  85   Glucose, 1 hour     70 - 179 mg/dL 409  811   Glucose, 2 hour     70 - 152 mg/dL 75  84    Previously on Ozempic  mostly for weight loss.  Now off.  ROS: + see HPI  I reviewed pt's medications, allergies, PMH,  social hx, family hx, and changes were documented in the history of present illness. Otherwise, unchanged from my initial visit note.  Past Medical History:  Diagnosis Date   Anemia    Anxiety    Arthritis    both hips   Beta thalassemia trait    Depression    Heart palpitations    History of gestational diabetes    History of Graves' disease 2015   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   History of pregnancy induced hypertension    History of suicidal behavior 07/2011   [facility] admission   Hyperlipidemia, mixed    Hypothyroidism, postsurgical 2015   followed by pcp  (previous endocrinologist--- dr Aldona Curry)  dx 2015  graves dx w/ toxic goiter  s/p  RAI 08-22-2013;   patient taking synthroid  became toxic 2017;   06-30-2016  s/p total thyroidectomy   Menorrhagia with irregular cycle    NSVT (nonsustained ventricular tachycardia) (HCC)    Sickle cell trait (HCC)    URI with cough and congestion 10/04/2023   10-18-2023  today pt stated has 2 days left of antibiotic and used rescue inhaler 3 days ago but no symptoms past 5 days;;   pt had UC visit @ [facility] [place] (office visit note in epic)  flu-like symptoms w/ productive cough , sob, sore throat;  dx acute pharygitis, acute cough, sob;  given antibiotic   Past Surgical History:  Procedure Laterality Date   EXCISIONAL HEMORRHOIDECTOMY  1996   HYDRADENITIS EXCISION Left 12/13/2021   Procedure: EXCISION HIDRADENITIS LEFT AXILLA;  Surgeon: [name] Blumenthal, MD;  Location: [facility];  Service: General;  Laterality: Left;   LAPAROSCOPIC BILATERAL SALPINGECTOMY Bilateral 02/14/2023   Procedure: LAPAROSCOPIC BILATERAL SALPINGECTOMY;  Surgeon: [name] Rein, MD;  Location: [facility];  Service: Gynecology;  Laterality: Bilateral;   THYROIDECTOMY N/A 06/30/2016   Procedure: TOTAL THYROIDECTOMY;  Surgeon: [name] Billow, MD;  Location: [facility] OR;  Service: General;  Laterality: N/A;   Social  History   Socioeconomic History   Marital status: Married    Spouse name: Not on file   Number of children: Not on file   Years of education: Not on file   Highest education level: Not on file  Occupational History   Not on file  Tobacco Use   Smoking status: Former    Current packs/day: 0.25    Average packs/day: 0.3 packs/day for 20.0 years (5.0 ttl pk-yrs)    Types: Cigars, Cigarettes   Smokeless tobacco: Never  Vaping Use   Vaping status: Never Used  Substance  and Sexual Activity   Alcohol use: Not Currently   Drug use: Not Currently   Sexual activity: Yes    Birth control/protection: Surgical    Comment: bilateral salpingectomy 09/ 2024  Other Topics Concern   Not on file  Social History Narrative   Not on file   Social Drivers of Health   Financial Resource Strain: Low Risk  (07/04/2023)   Received from [facility]   Overall Financial Resource Strain (CARDIA)    Difficulty of Paying Living Expenses: Not hard at all  Food Insecurity: No Food Insecurity (07/04/2023)   Received from [facility]   Hunger Vital Sign    Worried About Running Out of Food in the Last Year: Never true    Ran Out of Food in the Last Year: Never true  Transportation Needs: No Transportation Needs (07/04/2023)   Received [facility] - Transportation    Lack of Transportation (Medical): No    Lack of Transportation (Non-Medical): No  Physical Activity: Insufficiently Active (07/04/2023)   Received from [facility]   Exercise Vital Sign    Days of Exercise per Week: 1 day    Minutes of Exercise per Session: 20 min  Stress: No Stress Concern Present (07/04/2023)   Received from [facility] - Occupational Stress Questionnaire    Feeling of Stress : Not at all  Social Connections: Socially Integrated (07/04/2023)   Received from [facility]   Social Network    How would you rate your social network (family, work, friends)?:  Good participation with social networks  Intimate Partner Violence: Not At Risk (07/04/2023)   Received from [facility]   HITS    Over the last 12 months how often did your partner physically hurt you?: Never    Over the last 12 months how often did your partner insult you or talk down to you?: Never    Over the last 12 months how often did your partner threaten you with physical harm?: Never    Over the last 12 months how often did your partner scream or curse at you?: Never   Current Outpatient Medications on File Prior to Visit  Medication Sig Dispense Refill   albuterol  (VENTOLIN  HFA) 108 (90 Base) MCG/ACT inhaler Inhale 1-2 puffs into the lungs every 6 (six) hours as needed for wheezing or shortness of breath.     dicyclomine  (BENTYL ) 20 MG tablet Take 1 tablet (20 mg total) by mouth 2 (two) times daily. (Patient not taking: Reported on 10/18/2023) 20 tablet 0   doxycycline  (VIBRAMYCIN ) 100 MG capsule Take 1 capsule (100 mg total) by mouth 2 (two) times daily. Take with food as can cause GI distress. 20 capsule 0   levothyroxine  (SYNTHROID ) 112 MCG tablet Take 1 tablet (112 mcg total) by mouth daily before breakfast. (Patient taking differently: Take 112 mcg by mouth daily before breakfast.) 45 tablet 3   loratadine -pseudoephedrine  (CLARITIN -D 24-HOUR) 10-240 MG 24 hr tablet Take 1 tablet by mouth daily. (Patient taking differently: Take 1 tablet by mouth daily as needed.) 30 tablet 5   metoprolol  tartrate (LOPRESSOR ) 25 MG tablet TAKE 1 TABLET BY MOUTH TWICE A DAY (Patient taking differently: Take 25 mg by mouth daily as needed (palpitations). On 10-18-2023  pt stated last taken one was 4-5 months ago) 180 tablet 0   ondansetron  (ZOFRAN -ODT) 4 MG disintegrating tablet Take 1 tablet (4 mg total) by mouth every 8 (eight) hours as  needed for nausea or vomiting. (Patient not taking: Reported on 10/18/2023) 20 tablet 0   traMADol  (ULTRAM ) 50 MG tablet Take 1 tablet (50 mg total) by mouth every 12  (twelve) hours as needed. (Patient not taking: Reported on 10/18/2023) 30 tablet 0   WEGOVY  1.7 MG/0.75ML SOAJ Inject 1.7 mg into the skin once a week. Saturday's     No current facility-administered medications on file prior to visit.   Allergies  Allergen Reactions   Atenolol  Other (See Comments)    Muscle cramps   Methimazole  Hives    And muscle spasms   Penicillins Hives and Nausea And Vomiting    Pt tolerated Cefazolin  preop 11/2021 Has patient had a PCN reaction causing immediate rash, facial/tongue/throat swelling, SOB or lightheadedness with hypotension: #  #  #  YES  #  #  #  Has patient had a PCN reaction causing severe rash involving mucus membranes or skin necrosis: No Has patient had a PCN reaction that required hospitalization No Has patient had a PCN reaction occurring within the last 10 years: No If all of the above answers are "NO", then may proceed with Cephalosporin use.    Sulfa Antibiotics Hives and Nausea And Vomiting   Family History  Problem Relation Age of Onset   Hypertension Mother    Arthritis Mother    Hypertension Father    Hyperlipidemia Father    Congenital heart disease Sister    Breast cancer Sister 35   Diabetes Maternal Grandmother    Graves' disease Maternal Grandmother    Alzheimer's disease Maternal Grandmother    Diabetes Paternal Grandmother    Hypertension Paternal Grandmother    Cancer Paternal Grandfather        lung   Diabetes Other    PE: BP 120/70   Pulse 90   Ht 5\' 3"  (1.6 m)   Wt 215 lb 3.2 oz (97.6 kg)   LMP 08/01/2023 (Approximate)   SpO2 98%   BMI 38.12 kg/m   Wt Readings from Last 3 Encounters:  10/26/23 215 lb 3.2 oz (97.6 kg)  10/03/23 208 lb (94.3 kg)  04/30/23 233 lb 9.6 oz (106 kg)   Constitutional: Overweight, n NAD Eyes:  EOMI, no exophthalmos ENT: no neck masses, no cervical lymphadenopathy Cardiovascular: RRR, No MRG Respiratory: CTA B Musculoskeletal: no deformities Skin:no rashes Neurological: no  tremor with outstretched hands  ASSESSMENT: 1. Postablative hypothyroidism - after thyroidectomy for Graves' disease  - CT neck (06/05/2013):  Decrease in size of right tonsil abscess, previously 11 x 17 mm, now 6 x 6 mm with decreased inflammatory changes.  Thyromegaly is noted, with 19 mm AP isthmus and substernal extent. 8 mm left thyroid  nodule, with additional smaller nor nodules noted.  Prominent jugulodigastric lymph nodes, without pathologic features.  Apparent intimal thickening of the left internal carotid artery.   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  - 07/31/2013: Thyroid  Uptake and scan:  4 hr radio iodine uptake is calculated at 46%, above the normal range consistent with hyperthyroidism. Images in 3 projections demonstrate diffuse enlargement of thyroid  lobes bilaterally. Multiple small areas of slightly increased in decreased tracer localization identified suggesting diffuse small nodules. No dominant cold or hot nodule identified.  - 08/22/2013: RAI Tx 17.6 mCi.  She was off MMI x 2 weeks prior to the Uptake and scan  - 06/30/2016: Total thyroidectomy - Dr. Sofia Curry  2.  History of Graves'  disease  3. Prediabetes - per PCP - on Wegovy  now  PLAN:  1.  Patient with history of hyperthyroidism, initially considered due to either toxic multinodular goiter versus Graves' disease, with very high TSI antibodies pointing towards Graves' disease.  She initially had thyrotoxic symptoms: Weight loss, heat intolerance, tremors, palpitations, anxiety, which have all resolved after RAI treatment.  She developed postablative hypothyroidism and was briefly on levothyroxine , however, she was then lost for follow-up and she had recurrence of her thyrotoxicosis.  Therefore, I referred her to surgery and she had total thyroidectomy by Dr. Sofia Curry in 2018.  We started her on levothyroxine  and the dose was  adjusted afterwards. - At last visit, she was pregnant, and week 26.  At that time, we had her on LT4 88 mcg 9/7 days and advised her to go back to taking it daily after she gave birth.  I advised her to return in 6 months, but  she now returns after 1 year and 4 months. - latest thyroid  labs reviewed with pt. >> TSH was elevated 6 months ago: Lab Results  Component Value Date   TSH 9.330 (H) 04/24/2023  - she is now on LT4 112 mcg daily, dose increased from 100 mcg daily in 04/2023 - pt feels good on this dose, without complaints today.  She did have an upper respiratory infection recently.  We discussed that a low vitamin D  can increase the risk for these and I advised her to start back on ergocalciferol , as recommended by PCP. - we discussed about taking the thyroid  hormone every day, with water, >30 minutes before breakfast, separated by >4 hours from acid reflux medications, calcium , iron , multivitamins. Pt. is taking it correctly. - will check thyroid  tests today: TSH and fT4 - If labs are abnormal, she will need to return for repeat TFTs in 1.5 months - I will see her back in 6 mo  2.  History of Graves' disease - She has mild exophthalmos, most likely related to her Graves' disease - Latest TSI antibodies were undetectable - No signs of active Graves' ophthalmopathy: No double vision, blurry vision, eye pain, chemosis Needs refills.  Component     Latest Ref Rng 10/26/2023  TSH     mIU/L 0.26 (L)   T4,Free(Direct)     0.8 - 1.8 ng/dL 1.5   TSH is slightly low.  We need to decrease the dose of LT4 back to 100 mcg daily and recheck her TFTs in 1.5 months.  Emilie Harden, MD PhD [facility]

## 2023-10-27 LAB — T4, FREE: Free T4: 1.5 ng/dL (ref 0.8–1.8)

## 2023-10-27 LAB — TSH: TSH: 0.26 m[IU]/L — ABNORMAL LOW

## 2023-10-29 ENCOUNTER — Ambulatory Visit: Payer: Self-pay | Admitting: Internal Medicine

## 2023-10-29 MED ORDER — LEVOTHYROXINE SODIUM 100 MCG PO TABS: 100.0000 ug | ORAL_TABLET | Freq: Every day | ORAL | 4 refills | Status: DC

## 2023-10-29 NOTE — Anesthesia Preprocedure Evaluation (Signed)
 Anesthesia Evaluation  Patient identified by MRN, date of birth, ID band Patient awake    Reviewed: Allergy & Precautions, NPO status , Patient's Chart, lab work & pertinent test results  Airway Mallampati: III  TM Distance: >3 FB Neck ROM: Full    Dental  (+) Dental Advisory Given, Missing   Pulmonary former smoker   Pulmonary exam normal breath sounds clear to auscultation       Cardiovascular Normal cardiovascular exam+ dysrhythmias (SVT- metoprolol ) Supra Ventricular Tachycardia  Rhythm:Regular Rate:Normal     Neuro/Psych  PSYCHIATRIC DISORDERS Anxiety Depression    negative neurological ROS     GI/Hepatic negative GI ROS, Neg liver ROS,,,  Endo/Other  Hypothyroidism  BMI 38  Renal/GU negative Renal ROS  negative genitourinary   Musculoskeletal  (+) Arthritis , Osteoarthritis,    Abdominal  (+) + obese  Peds  Hematology  (+) Blood dyscrasia, Sickle cell trait   Anesthesia Other Findings Wegovy  LD:   Reproductive/Obstetrics                             Anesthesia Physical Anesthesia Plan  ASA: 2  Anesthesia Plan: General   Post-op Pain Management: Tylenol  PO (pre-op)* and Toradol  IV (intra-op)*   Induction: Intravenous  PONV Risk Score and Plan: 4 or greater and Ondansetron , Dexamethasone , Midazolam , Scopolamine  patch - Pre-op and Treatment may vary due to age or medical condition  Airway Management Planned: LMA  Additional Equipment: None  Intra-op Plan:   Post-operative Plan: Extubation in OR  Informed Consent:   Plan Discussed with:   Anesthesia Plan Comments:        Anesthesia Quick Evaluation

## 2023-10-29 NOTE — Addendum Note (Signed)
 Addended by: Emilie Harden on: 10/29/2023 07:35 AM   Modules accepted: Orders

## 2023-10-30 ENCOUNTER — Encounter (HOSPITAL_COMMUNITY)
Admission: RE | Disposition: A | Discharge status: Home / Self Care | Payer: Self-pay | Source: Home / Self Care | Attending: Obstetrics & Gynecology

## 2023-10-30 ENCOUNTER — Ambulatory Visit (HOSPITAL_COMMUNITY)
Admission: RE | Admit: 2023-10-30 | Discharge: 2023-10-30 | Disposition: A | Discharge status: Home / Self Care | Attending: Obstetrics & Gynecology | Admitting: Obstetrics & Gynecology

## 2023-10-30 ENCOUNTER — Other Ambulatory Visit: Payer: Self-pay

## 2023-10-30 ENCOUNTER — Ambulatory Visit (HOSPITAL_COMMUNITY): Payer: Self-pay | Admitting: Anesthesiology

## 2023-10-30 ENCOUNTER — Encounter (HOSPITAL_COMMUNITY): Payer: Self-pay | Admitting: Obstetrics & Gynecology

## 2023-10-30 DIAGNOSIS — Z87891 Personal history of nicotine dependence: Secondary | ICD-10-CM | POA: Insufficient documentation

## 2023-10-30 DIAGNOSIS — N921 Excessive and frequent menstruation with irregular cycle: Secondary | ICD-10-CM

## 2023-10-30 DIAGNOSIS — Z79899 Other long term (current) drug therapy: Secondary | ICD-10-CM | POA: Diagnosis not present

## 2023-10-30 DIAGNOSIS — D573 Sickle-cell trait: Secondary | ICD-10-CM | POA: Insufficient documentation

## 2023-10-30 DIAGNOSIS — E039 Hypothyroidism, unspecified: Secondary | ICD-10-CM | POA: Insufficient documentation

## 2023-10-30 DIAGNOSIS — M1990 Unspecified osteoarthritis, unspecified site: Secondary | ICD-10-CM | POA: Insufficient documentation

## 2023-10-30 DIAGNOSIS — Z01818 Encounter for other preprocedural examination: Secondary | ICD-10-CM

## 2023-10-30 DIAGNOSIS — N858 Other specified noninflammatory disorders of uterus: Secondary | ICD-10-CM | POA: Diagnosis not present

## 2023-10-30 DIAGNOSIS — I471 Supraventricular tachycardia, unspecified: Secondary | ICD-10-CM | POA: Insufficient documentation

## 2023-10-30 HISTORY — PX: DILATATION & CURRETTAGE/HYSTEROSCOPY WITH RESECTOCOPE: SHX5572

## 2023-10-30 HISTORY — PX: HYDROTHERMAL ABLATION/NOVASURE: SHX7586

## 2023-10-30 LAB — BASIC METABOLIC PANEL WITH GFR
Anion gap: 6 (ref 5–15)
BUN: 6 mg/dL (ref 6–20)
CO2: 26 mmol/L (ref 22–32)
Calcium: 8.9 mg/dL (ref 8.9–10.3)
Chloride: 106 mmol/L (ref 98–111)
Creatinine, Ser: 1.02 mg/dL — ABNORMAL HIGH (ref 0.44–1.00)
GFR, Estimated: >60 mL/min (ref >60)
Glucose, Bld: 94 mg/dL (ref 70–99)
Potassium: 3.7 mmol/L (ref 3.5–5.1)
Sodium: 138 mmol/L (ref 135–145)

## 2023-10-30 LAB — CBC
HCT: 39.2 % (ref 36.0–46.0)
Hemoglobin: 12.4 g/dL (ref 12.0–15.0)
MCH: 24.6 pg — ABNORMAL LOW (ref 26.0–34.0)
MCHC: 31.6 g/dL (ref 30.0–36.0)
MCV: 77.6 fL — ABNORMAL LOW (ref 80.0–100.0)
Platelets: 391 10*3/uL (ref 150–400)
RBC: 5.05 MIL/uL (ref 3.87–5.11)
RDW: 16.2 % — ABNORMAL HIGH (ref 11.5–15.5)
WBC: 8.8 10*3/uL (ref 4.0–10.5)
nRBC: 0 % (ref 0.0–0.2)

## 2023-10-30 LAB — TYPE AND SCREEN
ABO/RH(D): A POS
Antibody Screen: NEGATIVE

## 2023-10-30 LAB — POCT PREGNANCY, URINE: Preg Test, Ur: NEGATIVE

## 2023-10-30 SURGERY — DILATATION & CURETTAGE/HYSTEROSCOPY WITH RESECTOCOPE
Anesthesia: General | Site: Uterus

## 2023-10-30 MED ORDER — LIDOCAINE 2% (20 MG/ML) 5 ML SYRINGE
INTRAMUSCULAR | Status: DC | PRN
  Administered 2023-10-30: 40 mg via INTRAVENOUS

## 2023-10-30 MED ORDER — IBUPROFEN 800 MG PO TABS: 800.0000 mg | ORAL_TABLET | Freq: Three times a day (TID) | ORAL | 0 refills | Status: AC | PRN

## 2023-10-30 MED ORDER — HYDROCODONE-ACETAMINOPHEN 5-325 MG PO TABS: 1.0000 | ORAL_TABLET | Freq: Four times a day (QID) | ORAL | 0 refills | Status: DC | PRN

## 2023-10-30 MED ORDER — FENTANYL CITRATE (PF) 250 MCG/5ML IJ SOLN
INTRAMUSCULAR | Status: AC
  Filled 2023-10-30: qty 5

## 2023-10-30 MED ORDER — METRONIDAZOLE 500 MG/100ML IV SOLN
500.0000 mg | INTRAVENOUS | Status: AC
  Administered 2023-10-30: 500 mg via INTRAVENOUS

## 2023-10-30 MED ORDER — GENTAMICIN SULFATE 40 MG/ML IJ SOLN
5.0000 mg/kg | INTRAVENOUS | Status: AC
  Administered 2023-10-30: 470 mg via INTRAVENOUS
  Filled 2023-10-30: qty 11.75

## 2023-10-30 MED ORDER — LIDOCAINE-EPINEPHRINE 1 %-1:100000 IJ SOLN
INTRAMUSCULAR | Status: AC
  Filled 2023-10-30: qty 1

## 2023-10-30 MED ORDER — SILVER NITRATE-POT NITRATE 75-25 % EX MISC
CUTANEOUS | Status: DC | PRN
  Administered 2023-10-30: 2

## 2023-10-30 MED ORDER — LACTATED RINGERS IV SOLN: INTRAVENOUS | Status: DC

## 2023-10-30 MED ORDER — POVIDONE-IODINE 10 % EX SWAB: 2.0000 | Freq: Once | CUTANEOUS | Status: DC

## 2023-10-30 MED ORDER — ONDANSETRON HCL 4 MG/2ML IJ SOLN
INTRAMUSCULAR | Status: DC | PRN
  Administered 2023-10-30: 4 mg via INTRAVENOUS

## 2023-10-30 MED ORDER — LIDOCAINE-EPINEPHRINE 1 %-1:100000 IJ SOLN
INTRAMUSCULAR | Status: DC | PRN
  Administered 2023-10-30: 10 mL

## 2023-10-30 MED ORDER — OXYCODONE HCL 5 MG PO TABS
ORAL_TABLET | ORAL | Status: AC
  Filled 2023-10-30: qty 1

## 2023-10-30 MED ORDER — MIDAZOLAM HCL 2 MG/2ML IJ SOLN
INTRAMUSCULAR | Status: AC
  Filled 2023-10-30: qty 2

## 2023-10-30 MED ORDER — ACETAMINOPHEN 500 MG PO TABS: 1000.0000 mg | ORAL_TABLET | ORAL | Status: AC

## 2023-10-30 MED ORDER — FENTANYL CITRATE (PF) 250 MCG/5ML IJ SOLN
INTRAMUSCULAR | Status: DC | PRN
  Administered 2023-10-30: 50 ug via INTRAVENOUS
  Administered 2023-10-30: 25 ug via INTRAVENOUS
  Administered 2023-10-30: 50 ug via INTRAVENOUS
  Administered 2023-10-30: 25 ug via INTRAVENOUS

## 2023-10-30 MED ORDER — ONDANSETRON HCL 4 MG/2ML IJ SOLN: 4.0000 mg | Freq: Once | INTRAMUSCULAR | Status: DC | PRN

## 2023-10-30 MED ORDER — ACETAMINOPHEN 500 MG PO TABS: 1000.0000 mg | ORAL_TABLET | Freq: Once | ORAL | Status: DC

## 2023-10-30 MED ORDER — ONDANSETRON HCL 4 MG/2ML IJ SOLN
INTRAMUSCULAR | Status: AC
  Filled 2023-10-30: qty 2

## 2023-10-30 MED ORDER — SCOPOLAMINE 1 MG/3DAYS TD PT72: 1.0000 | MEDICATED_PATCH | TRANSDERMAL | Status: DC

## 2023-10-30 MED ORDER — PROPOFOL 10 MG/ML IV BOLUS
INTRAVENOUS | Status: DC | PRN
  Administered 2023-10-30: 200 mg via INTRAVENOUS

## 2023-10-30 MED ORDER — PROPOFOL 10 MG/ML IV BOLUS
INTRAVENOUS | Status: AC
Start: 2023-10-30 — End: ?
  Filled 2023-10-30: qty 20

## 2023-10-30 MED ORDER — DEXAMETHASONE SODIUM PHOSPHATE 10 MG/ML IJ SOLN
INTRAMUSCULAR | Status: AC
  Filled 2023-10-30: qty 1

## 2023-10-30 MED ORDER — KETOROLAC TROMETHAMINE 30 MG/ML IJ SOLN: 30.0000 mg | Freq: Once | INTRAMUSCULAR | Status: DC | PRN

## 2023-10-30 MED ORDER — PHENYLEPHRINE 80 MCG/ML (10ML) SYRINGE FOR IV PUSH (FOR BLOOD PRESSURE SUPPORT)
PREFILLED_SYRINGE | INTRAVENOUS | Status: AC
  Filled 2023-10-30: qty 10

## 2023-10-30 MED ORDER — HYDROMORPHONE HCL 1 MG/ML IJ SOLN
INTRAMUSCULAR | Status: AC
  Filled 2023-10-30: qty 1

## 2023-10-30 MED ORDER — CHLORHEXIDINE GLUCONATE 0.12 % MT SOLN: 15.0000 mL | Freq: Once | OROMUCOSAL | Status: AC

## 2023-10-30 MED ORDER — OXYCODONE HCL 5 MG PO TABS
5.0000 mg | ORAL_TABLET | Freq: Once | ORAL | Status: AC | PRN
  Administered 2023-10-30: 5 mg via ORAL

## 2023-10-30 MED ORDER — LIDOCAINE 2% (20 MG/ML) 5 ML SYRINGE
INTRAMUSCULAR | Status: AC
  Filled 2023-10-30: qty 5

## 2023-10-30 MED ORDER — OXYCODONE HCL 5 MG/5ML PO SOLN: 5.0000 mg | Freq: Once | ORAL | Status: AC | PRN

## 2023-10-30 MED ORDER — METRONIDAZOLE 500 MG/100ML IV SOLN
INTRAVENOUS | Status: AC
  Filled 2023-10-30: qty 100

## 2023-10-30 MED ORDER — ORAL CARE MOUTH RINSE: 15.0000 mL | Freq: Once | OROMUCOSAL | Status: AC

## 2023-10-30 MED ORDER — MIDAZOLAM HCL 2 MG/2ML IJ SOLN
INTRAMUSCULAR | Status: DC | PRN
  Administered 2023-10-30: 2 mg via INTRAVENOUS

## 2023-10-30 MED ORDER — MEPERIDINE HCL 25 MG/ML IJ SOLN: 6.2500 mg | INTRAMUSCULAR | Status: DC | PRN

## 2023-10-30 MED ORDER — HYDROMORPHONE HCL 1 MG/ML IJ SOLN
0.2500 mg | INTRAMUSCULAR | Status: DC | PRN
  Administered 2023-10-30: 0.5 mg via INTRAVENOUS

## 2023-10-30 MED ORDER — CHLORHEXIDINE GLUCONATE 0.12 % MT SOLN
OROMUCOSAL | Status: AC
  Administered 2023-10-30: 15 mL via OROMUCOSAL
  Filled 2023-10-30: qty 15

## 2023-10-30 MED ORDER — DEXAMETHASONE SODIUM PHOSPHATE 10 MG/ML IJ SOLN
INTRAMUSCULAR | Status: DC | PRN
  Administered 2023-10-30: 4 mg via INTRAVENOUS

## 2023-10-30 MED ORDER — SCOPOLAMINE 1 MG/3DAYS TD PT72
MEDICATED_PATCH | TRANSDERMAL | Status: AC
  Administered 2023-10-30: 1.5 mg via TRANSDERMAL
  Filled 2023-10-30: qty 1

## 2023-10-30 MED ORDER — AMISULPRIDE (ANTIEMETIC) 5 MG/2ML IV SOLN
10.0000 mg | Freq: Once | INTRAVENOUS | Status: DC | PRN
Start: 2023-10-30 — End: 2023-10-30

## 2023-10-30 MED ORDER — PHENYLEPHRINE 80 MCG/ML (10ML) SYRINGE FOR IV PUSH (FOR BLOOD PRESSURE SUPPORT)
PREFILLED_SYRINGE | INTRAVENOUS | Status: DC | PRN
  Administered 2023-10-30: 80 ug via INTRAVENOUS
  Administered 2023-10-30: 160 ug via INTRAVENOUS

## 2023-10-30 MED ORDER — ACETAMINOPHEN 500 MG PO TABS
ORAL_TABLET | ORAL | Status: AC
  Administered 2023-10-30: 1000 mg via ORAL
  Filled 2023-10-30: qty 2

## 2023-10-30 MED ORDER — SODIUM CHLORIDE 0.9 % IR SOLN
Status: DC | PRN
  Administered 2023-10-30: 3000 mL

## 2023-10-30 MED ORDER — PROPOFOL 10 MG/ML IV BOLUS
INTRAVENOUS | Status: AC
  Filled 2023-10-30: qty 20

## 2023-10-30 SURGICAL SUPPLY — 20 items
ABLATOR SURESOUND NOVASURE (ABLATOR) IMPLANT
CANISTER SUCTION 3000ML PPV (SUCTIONS) ×1 IMPLANT
CATH ROBINSON RED A/P 16FR (CATHETERS) ×1 IMPLANT
COVER MAYO STAND STRL (DRAPES) ×1 IMPLANT
DEVICE MYOSURE LITE (MISCELLANEOUS) IMPLANT
DEVICE MYOSURE REACH (MISCELLANEOUS) IMPLANT
DILATOR CANAL MILEX (MISCELLANEOUS) IMPLANT
GLOVE ECLIPSE 6.5 STRL STRAW (GLOVE) ×1 IMPLANT
GLOVE SURG UNDER POLY LF SZ7 (GLOVE) ×2 IMPLANT
GOWN STRL REUS W/ TWL LRG LVL3 (GOWN DISPOSABLE) ×2 IMPLANT
GOWN STRL REUS W/ TWL XL LVL3 (GOWN DISPOSABLE) IMPLANT
KIT PROCEDURE FLUENT (KITS) ×1 IMPLANT
KIT TURNOVER KIT B (KITS) ×1 IMPLANT
PACK VAGINAL MINOR WOMEN LF (CUSTOM PROCEDURE TRAY) ×1 IMPLANT
PAD OB MATERNITY 11 LF (PERSONAL CARE ITEMS) ×1 IMPLANT
SEAL ROD LENS SCOPE MYOSURE (ABLATOR) ×1 IMPLANT
SOL .9 NS 3000ML IRR UROMATIC (IV SOLUTION) IMPLANT
SOL PREP POV-IOD 4OZ 10% (MISCELLANEOUS) IMPLANT
TOWEL GREEN STERILE FF (TOWEL DISPOSABLE) ×2 IMPLANT
UNDERPAD 30X36 HEAVY ABSORB (UNDERPADS AND DIAPERS) ×1 IMPLANT

## 2023-10-30 NOTE — Transfer of Care (Signed)
 Immediate Anesthesia Transfer of Care Note  Patient: [name] Curry  Procedure(s) Performed: DILATATION & CURETTAGE/HYSTEROSCOPY (Uterus) NOVASURE ABLATION (Uterus)  Patient Location: PACU  Anesthesia Type:General  Level of Consciousness: awake, patient cooperative, and responds to stimulation  Airway & Oxygen Therapy: Patient Spontanous Breathing  Post-op Assessment: Report given to RN and Post -op Vital signs reviewed and stable  Post vital signs: Reviewed and stable  Last Vitals:  Vitals Value Taken Time  BP 114/75 10/30/23 1547  Temp    Pulse 87 10/30/23 1549  Resp 16 10/30/23 1549  SpO2 98 % 10/30/23 1549  Vitals shown include unfiled device data.  Last Pain:  Vitals:   10/30/23 1205  TempSrc: Oral  PainSc: 0-No pain         Complications: No notable events documented.

## 2023-10-30 NOTE — Anesthesia Procedure Notes (Signed)
 Procedure Name: LMA Insertion Date/Time: 10/30/2023 3:01 PM  Performed by: Claud Crumb, CRNAPre-anesthesia Checklist: Patient identified, Emergency Drugs available, Suction available and Patient being monitored Patient Re-evaluated:Patient Re-evaluated prior to induction Oxygen Delivery Method: Circle System Utilized Preoxygenation: Pre-oxygenation with 100% oxygen Induction Type: IV induction Ventilation: Mask ventilation without difficulty LMA: LMA inserted LMA Size: 4.0 Number of attempts: 1 Airway Equipment and Method: Bite block Placement Confirmation: positive ETCO2 Tube secured with: Tape Dental Injury: Teeth and Oropharynx as per pre-operative assessment

## 2023-10-30 NOTE — H&P (Signed)
 [name] Curry is an 43 y.o. femaleG1P1 MAAF here for treatment of menorrhagia with irregular bleeding.  This change in bleeding started in March and hasn't improved despite oral progesterone.  She did have a CT scan done in March for unrelated reasons.  The uterus was normal on this imaging and the was a 3cm simple ovarian cyst that no follow up was recommended.  Endometrial biopsy was done 5/14 but showed endometritis.  Surgery was scheduled but rescheduled for her to have outpatient antibiotics.  Bleeding didn't improve and she was rescheduled but then developed and upper respiratory infection requiring additional antibiotic therapy.  Surgery was rescheduled again.  She is here today for treatment of her bleeding with hysteroscopy, D&C and novasure ablation.  Risks and benefits reviewed.  She is really not interested in hormonal therapy.  She did undergo laparoscopic salpingectomy 01/2023 and knows she should never be pregnancy after and endometrial ablation.  She voices clear understanding.    Pertinent Gynecological History: Menses: heavy and irregular Contraception: tubal ligation DES exposure: denies Blood transfusions: none Sexually transmitted diseases: no past history Previous GYN Procedures: laparoscopic bilateral salpingectomy  Last mammogram: normal Date: 04/2023 Last pap: normal Date: 02/2022 OB History: G1, P1   Menstrual History: No LMP recorded (lmp unknown). (Menstrual status: Irregular Periods).    Past Medical History:  Diagnosis Date   Anemia    Anxiety    Arthritis    both hips   Beta thalassemia trait    Depression    Heart palpitations    History of gestational diabetes    History of Graves' disease 2015   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   History of pregnancy induced hypertension    History of suicidal behavior 07/2011   [facility] admission   Hyperlipidemia, mixed    Hypothyroidism, postsurgical 2015   followed by pcp  (previous  endocrinologist--- dr Aldona Amel)  dx 2015  graves dx w/ toxic goiter  s/p  RAI 08-22-2013;   patient taking synthroid  became toxic 2017;   06-30-2016  s/p total thyroidectomy   Menorrhagia with irregular cycle    NSVT (nonsustained ventricular tachycardia) (HCC)    Sickle cell trait (HCC)    URI with cough and congestion 10/04/2023   10-18-2023  today pt stated has 2 days left of antibiotic and used rescue inhaler 3 days ago but no symptoms past 5 days;;   pt had UC visit @ [facility] [place] (office visit note in epic)  flu-like symptoms w/ productive cough , sob, sore throat;  dx acute pharygitis, acute cough, sob;  given antibiotic    Past Surgical History:  Procedure Laterality Date   EXCISIONAL HEMORRHOIDECTOMY  1996   HYDRADENITIS EXCISION Left 12/13/2021   Procedure: EXCISION HIDRADENITIS LEFT AXILLA;  Surgeon: Oza Blumenthal, MD;  Location: [facility];  Service: General;  Laterality: Left;   LAPAROSCOPIC BILATERAL SALPINGECTOMY Bilateral 02/14/2023   Procedure: LAPAROSCOPIC BILATERAL SALPINGECTOMY;  Surgeon: Lillian Rein, MD;  Location: [facility];  Service: Gynecology;  Laterality: Bilateral;   THYROIDECTOMY N/A 06/30/2016   Procedure: TOTAL THYROIDECTOMY;  Surgeon: Oralee Billow, MD;  Location: [facility] OR;  Service: General;  Laterality: N/A;    Family History  Problem Relation Age of Onset   Hypertension Mother    Arthritis Mother    Hypertension Father    Hyperlipidemia Father    Congenital heart disease Sister    Breast cancer Sister 62  Diabetes Maternal Grandmother    Graves' disease Maternal Grandmother    Alzheimer's disease Maternal Grandmother    Diabetes Paternal Grandmother    Hypertension Paternal Grandmother    Cancer Paternal Grandfather        lung   Diabetes Other     Social History:  reports that she has quit smoking. Her smoking use included cigars and cigarettes. She has a 5 pack-year smoking history. She has never used  smokeless tobacco. She reports that she does not currently use alcohol. She reports that she does not currently use drugs.  Allergies:  Allergies  Allergen Reactions   Atenolol  Other (See Comments)    Muscle cramps   Methimazole  Hives    And muscle spasms   Penicillins Hives and Nausea And Vomiting    Pt tolerated Cefazolin  preop 11/2021 Has patient had a PCN reaction causing immediate rash, facial/tongue/throat swelling, SOB or lightheadedness with hypotension: #  #  #  YES  #  #  #  Has patient had a PCN reaction causing severe rash involving mucus membranes or skin necrosis: No Has patient had a PCN reaction that required hospitalization No Has patient had a PCN reaction occurring within the last 10 years: No If all of the above answers are "NO", then may proceed with Cephalosporin use.    Sulfa Antibiotics Hives and Nausea And Vomiting    Medications Prior to Admission  Medication Sig Dispense Refill Last Dose/Taking   levothyroxine  (SYNTHROID ) 100 MCG tablet Take 1 tablet (100 mcg total) by mouth daily before breakfast. 45 tablet 4 Past Week   norethindrone  (AYGESTIN ) 5 MG tablet 1 TABLET BY MOUTH 2 TIMES DAILY. WHEN BLEEDING STOPS, DECREASE TO DAILY FOR TOTAL OF 21 DAYS, THEN STOP MEDICATION. 180 tablet 1 10/29/2023   albuterol  (VENTOLIN  HFA) 108 (90 Base) MCG/ACT inhaler Inhale 1-2 puffs into the lungs every 6 (six) hours as needed for wheezing or shortness of breath.      loratadine -pseudoephedrine  (CLARITIN -D 24-HOUR) 10-240 MG 24 hr tablet Take 1 tablet by mouth daily. (Patient taking differently: Take 1 tablet by mouth daily as needed.) 30 tablet 5    metoprolol  tartrate (LOPRESSOR ) 25 MG tablet TAKE 1 TABLET BY MOUTH TWICE A DAY (Patient taking differently: Take 25 mg by mouth daily as needed (palpitations). On 10-18-2023  pt stated last taken one was 4-5 months ago) 180 tablet 0 More than a month   WEGOVY  1.7 MG/0.75ML SOAJ Inject 1.7 mg into the skin once a week. Saturday's  (Patient not taking: Reported on 10/26/2023)       Review of Systems  Constitutional: Negative.   Genitourinary:        Heavy and irregular bleeding    Blood pressure (!) 141/93, pulse 83, temperature 98.4 F (36.9 C), temperature source Oral, resp. rate 17, height 5\' 3"  (1.6 m), weight 95.3 kg, SpO2 100%, not currently breastfeeding. Physical Exam Constitutional:      Appearance: Normal appearance.  Cardiovascular:     Rate and Rhythm: Normal rate and regular rhythm.  Pulmonary:     Effort: Pulmonary effort is normal.     Breath sounds: Normal breath sounds.  Neurological:     General: No focal deficit present.     Mental Status: She is alert.  Psychiatric:        Mood and Affect: Mood normal.        Behavior: Behavior normal.     Results for orders placed or performed during the hospital encounter of  10/30/23 (from the past 24 hours)  Pregnancy, urine POC     Status: None   Collection Time: 10/30/23 11:46 AM  Result Value Ref Range   Preg Test, Ur NEGATIVE NEGATIVE  Basic metabolic panel per protocol     Status: Abnormal   Collection Time: 10/30/23 12:00 PM  Result Value Ref Range   Sodium 138 135 - 145 mmol/L   Potassium 3.7 3.5 - 5.1 mmol/L   Chloride 106 98 - 111 mmol/L   CO2 26 22 - 32 mmol/L   Glucose, Bld 94 70 - 99 mg/dL   BUN 6 6 - 20 mg/dL   Creatinine, Ser 1.61 (H) 0.44 - 1.00 mg/dL   Calcium  8.9 8.9 - 10.3 mg/dL   GFR, Estimated >09 >60 mL/min   Anion gap 6 5 - 15  CBC     Status: Abnormal   Collection Time: 10/30/23 12:00 PM  Result Value Ref Range   WBC 8.8 4.0 - 10.5 K/uL   RBC 5.05 3.87 - 5.11 MIL/uL   Hemoglobin 12.4 12.0 - 15.0 g/dL   HCT 45.4 09.8 - 11.9 %   MCV 77.6 (L) 80.0 - 100.0 fL   MCH 24.6 (L) 26.0 - 34.0 pg   MCHC 31.6 30.0 - 36.0 g/dL   RDW 14.7 (H) 82.9 - 56.2 %   Platelets 391 150 - 400 K/uL   nRBC 0.0 0.0 - 0.2 %  Type and screen     Status: None   Collection Time: 10/30/23 12:02 PM  Result Value Ref Range   ABO/RH(D) A POS     Antibody Screen NEG    Sample Expiration      11/02/2023,2359 Performed at [facility], [address] [place], [place] [address]     No results found.  Assessment/Plan: 43 yo G1P1 MAAF with menorrhagia with irregular bleeding her for treatment with hysteroscopy, D&C and novasure ablation.  She and I discussed removal of any additional intracavitary lesion as well.  Questions answered.  Pt ready to proceed.   Lillian Rein 10/30/2023, 2:33 PM

## 2023-10-30 NOTE — Op Note (Signed)
 10/30/2023  3:43 PM  PATIENT:  [name] Curry  43 y.o. female  PRE-OPERATIVE DIAGNOSIS:  Menorrhagia with irregular cycle  POST-OPERATIVE DIAGNOSIS:  Menorrhagia with irregular cycle  PROCEDURE:  Procedure(s): DILATATION & CURETTAGE/HYSTEROSCOPY NOVASURE ABLATION  SURGEON:  Lillian Rein  ASSISTANTS: OR staff.    ANESTHESIA:   general  ESTIMATED BLOOD LOSS: 10 mL  BLOOD ADMINISTERED:none   FLUIDS: 600cc LR  UOP: 50cc  SPECIMEN:  endometrial curetting  DISPOSITION OF SPECIMEN:  PATHOLOGY  FINDINGS: fluffy appearing endometrial tissue present prior to D&C, normal appearing endometrial cavity  DESCRIPTION OF OPERATION: Patient was taken to the operating room.  She is placed in the supine position. SCDs were on her lower extremities and functioning properly. General anesthesia with an LMA was administered without difficulty. Dr. Gail Joseph, anesthesia, oversaw case.  Legs were then placed in the [facility] stirrups in the low lithotomy position. The legs were lifted to the high lithotomy position and the Betadine prep was used on the inner thighs perineum and vagina x3. Patient was draped in a normal standard fashion. An in and out catheterization with a red rubber Foley catheter was performed. Approximately 50 cc of clear urine was noted. A bivalve speculum was placed the vagina. The anterior lip of the cervix was grasped with single-tooth tenaculum.  A paracervical block of 1% lidocaine  mixed one-to-one with epinephrine  (1:100,000 units).  10 cc was used total.  Cervix was parous and open and essentially no dilation was needed at the #21 dilator passed easily.  The endometrial cavity sounded to 7/5 cm.   A 5.5 millimeter diagnostic hysteroscope was obtained. Normal saline was used as a hysteroscopic fluid. The hysteroscope was advanced through the endocervical canal into the endometrial cavity. The tubal ostia were noted bilaterally. Additional findings included significant fluffy  appearing endometrium.  The hysteroscope was removed. A #1 toothed curette was used to curette the cavity until rough gritty texture was noted in all quadrants. With revisualization of the hysteroscope, there was no longer any abnormal findings.  Hysteroscope was removed.  Cervix measured 3.5cm.  Endometrial cavity measuring 4.0cm.  Novasure device was obtained.   Device passed into the uterine cavity and array was opened.  Device seated. Cavity width 3.5.  Power setting 77.  Cavity assessment test performed and failed as air was escaping around the device.  This could be heard during the cavity assessment test.  The cervix was then retracted more firmly with the tenaculum and the cavity assessment test performed again and passed.  Then the ablation cycle was initiated. It lasted 120 seconds.  Array was closed and device removed.  The hysteroscope was used to visualize the cavity one additional time.  Good ablation cycle was noted.  Hysteroscope was removed.  At this point no other procedure was needed and this procedure was ended.  The fluid deficit was 135 cc. The tenaculum was removed from the anterior lip of the cervix. The speculum was removed from the vagina. The prep was cleansed of the patient's skin. The legs are positioned back in the supine position. Sponge, lap, needle, instrument counts were correct x2. Patient was taken to recovery in stable condition.  COUNTS:  YES  PLAN OF CARE: Transfer to PACU

## 2023-10-30 NOTE — Discharge Instructions (Addendum)
     No acetaminophen /Tylenol  until after 6:00 pm today if needed.     Post Anesthesia Home Care Instructions  Activity: Get plenty of rest for the remainder of the day. A responsible individual must stay with you for 24 hours following the procedure.  For the next 24 hours, DO NOT: -Drive a car -Advertising copywriter -Drink alcoholic beverages -Take any medication unless instructed by your physician -Make any legal decisions or sign important papers.  Meals: Start with liquid foods such as gelatin or soup. Progress to regular foods as tolerated. Avoid greasy, spicy, heavy foods. If nausea and/or vomiting occur, drink only clear liquids until the nausea and/or vomiting subsides. Call your physician if vomiting continues.  Special Instructions/Symptoms: Your throat may feel dry or sore from the anesthesia or the breathing tube placed in your throat during surgery. If this causes discomfort, gargle with warm salt water. The discomfort should disappear within 24 hours.  If you had a scopolamine  patch placed behind your ear for the management of post- operative nausea and/or vomiting:  1. The medication in the patch is effective for 72 hours, after which it should be removed.  Wrap patch in a tissue and discard in the trash. Wash hands thoroughly with soap and water. 2. You may remove the patch earlier than 72 hours if you experience unpleasant side effects which may include dry mouth, dizziness or visual disturbances. 3. Avoid touching the patch. Wash your hands with soap and water after contact with the patch. Remove patch behind left ear by Friday, Oct 02, 2023.

## 2023-10-31 ENCOUNTER — Encounter (HOSPITAL_COMMUNITY): Payer: Self-pay | Admitting: Obstetrics & Gynecology

## 2023-10-31 NOTE — Anesthesia Postprocedure Evaluation (Signed)
 Anesthesia Post Note  Patient: KYLA DUFFY  Procedure(s) Performed: DILATATION & CURETTAGE/HYSTEROSCOPY (Uterus) NOVASURE ABLATION (Uterus)     Patient location during evaluation: PACU Anesthesia Type: General Level of consciousness: awake and alert Pain management: pain level controlled Vital Signs Assessment: post-procedure vital signs reviewed and stable Respiratory status: spontaneous breathing, nonlabored ventilation, respiratory function stable and patient connected to nasal cannula oxygen Cardiovascular status: blood pressure returned to baseline and stable Postop Assessment: no apparent nausea or vomiting Anesthetic complications: no  No notable events documented.  Last Vitals:  Vitals:   10/30/23 1715 10/30/23 1730  BP: 136/85 (!) 145/85  Pulse: 68 67  Resp: 19 (!) 22  Temp:    SpO2: 97% 96%    Last Pain:  Vitals:   10/30/23 1730  TempSrc:   PainSc: 0-No pain   Pain Goal:                   Colton Tassin L Malley Hauter

## 2023-11-01 ENCOUNTER — Ambulatory Visit (HOSPITAL_BASED_OUTPATIENT_CLINIC_OR_DEPARTMENT_OTHER): Payer: Self-pay | Admitting: Obstetrics & Gynecology

## 2023-11-01 DIAGNOSIS — Z419 Encounter for procedure for purposes other than remedying health state, unspecified: Secondary | ICD-10-CM | POA: Diagnosis not present

## 2023-11-01 LAB — SURGICAL PATHOLOGY

## 2023-11-02 ENCOUNTER — Ambulatory Visit: Attending: Obstetrics & Gynecology | Admitting: Physical Therapy

## 2023-11-02 ENCOUNTER — Other Ambulatory Visit: Payer: Self-pay | Admitting: Nurse Practitioner

## 2023-11-02 DIAGNOSIS — I4729 Other ventricular tachycardia: Secondary | ICD-10-CM

## 2023-11-06 ENCOUNTER — Ambulatory Visit (HOSPITAL_BASED_OUTPATIENT_CLINIC_OR_DEPARTMENT_OTHER): Admitting: Obstetrics & Gynecology

## 2023-11-06 ENCOUNTER — Encounter (HOSPITAL_BASED_OUTPATIENT_CLINIC_OR_DEPARTMENT_OTHER): Payer: Self-pay | Admitting: Obstetrics & Gynecology

## 2023-11-06 VITALS — BP 118/78 | HR 71 | Ht 63.0 in | Wt 210.6 lb

## 2023-11-06 DIAGNOSIS — R3915 Urgency of urination: Secondary | ICD-10-CM | POA: Diagnosis not present

## 2023-11-06 DIAGNOSIS — N921 Excessive and frequent menstruation with irregular cycle: Secondary | ICD-10-CM

## 2023-11-06 DIAGNOSIS — Z9889 Other specified postprocedural states: Secondary | ICD-10-CM

## 2023-11-06 MED ORDER — MIRABEGRON ER 50 MG PO TB24: 50.0000 mg | ORAL_TABLET | Freq: Every day | ORAL | 1 refills | Status: DC

## 2023-11-06 NOTE — Progress Notes (Signed)
 GYNECOLOGY  VISIT  CC:   post op recheck  HPI: 43 y.o. [id number] [name] here for recheck after undergoing hysteroscopy, D&C and novasure ablation on 10/30/2023.  Bleeding was much much better right after surgery.  She's had a little spotting and watery discharge for a few days.  Took pain medicine for two days.  She is having a lot of urgency.  Getting up four times a night.  No bowel issus.     Pathology reviewed:  Yes .  Questions answered.    MEDS:   Current Outpatient Medications on File Prior to Visit  Medication Sig Dispense Refill   albuterol  (VENTOLIN  HFA) 108 (90 Base) MCG/ACT inhaler Inhale 1-2 puffs into the lungs every 6 (six) hours as needed for wheezing or shortness of breath.     HYDROcodone -acetaminophen  (NORCO/VICODIN) 5-325 MG tablet Take 1-2 tablets by mouth every 6 (six) hours as needed for moderate pain (pain score 4-6). 15 tablet 0   ibuprofen  (ADVIL ) 800 MG tablet Take 1 tablet (800 mg total) by mouth every 8 (eight) hours as needed. 20 tablet 0   levothyroxine  (SYNTHROID ) 100 MCG tablet Take 1 tablet (100 mcg total) by mouth daily before breakfast. 45 tablet 4   loratadine -pseudoephedrine  (CLARITIN -D 24-HOUR) 10-240 MG 24 hr tablet Take 1 tablet by mouth daily. (Patient taking differently: Take 1 tablet by mouth daily as needed.) 30 tablet 5   metoprolol  tartrate (LOPRESSOR ) 25 MG tablet TAKE 1 TABLET BY MOUTH TWICE A DAY (Patient taking differently: Take 25 mg by mouth daily as needed (palpitations). On 10-18-2023  pt stated last taken one was 4-5 months ago) 180 tablet 0   WEGOVY  1.7 MG/0.75ML SOAJ Inject 1.7 mg into the skin once a week. Saturday's (Patient not taking: Reported on 10/26/2023)     No current facility-administered medications on file prior to visit.    SH:  Smoking No    PHYSICAL EXAMINATION:    BP (!) 137/90   Pulse 71   Ht 5' 3 (1.6 m) Comment: Reported  Wt 210 lb 9.6 oz (95.5 kg)   LMP  (LMP Unknown)   BMI  37.31 kg/m     General appearance: alert, cooperative and appears stated age CV:  Regular rate and rhythm Lungs:  clear to auscultation, no wheezes, rales or rhonchi, symmetric air entry Abdomen: soft, non-tender; bowel sounds normal; no masses,  no organomegaly   Assessment/Plan: 1. Post-operative state (Primary) - pt is going to give me an update about bleeding in 3 months  2. Urinary urgency - Urine Culture - mirabegron ER (MYRBETRIQ) 50 MG TB24 tablet; Take 1 tablet (50 mg total) by mouth daily. One po qd  Dispense: 30 tablet; Refill: 1  3. Menorrhagia with irregular cycle - bleeding is much improved

## 2023-11-08 ENCOUNTER — Ambulatory Visit: Admitting: Physical Therapy

## 2023-11-08 ENCOUNTER — Ambulatory Visit (HOSPITAL_BASED_OUTPATIENT_CLINIC_OR_DEPARTMENT_OTHER): Payer: Self-pay | Admitting: Obstetrics & Gynecology

## 2023-11-08 LAB — URINE CULTURE: Organism ID, Bacteria: NO GROWTH

## 2023-11-10 DIAGNOSIS — R1084 Generalized abdominal pain: Secondary | ICD-10-CM | POA: Diagnosis not present

## 2023-11-14 ENCOUNTER — Encounter (HOSPITAL_BASED_OUTPATIENT_CLINIC_OR_DEPARTMENT_OTHER): Payer: Self-pay | Admitting: Obstetrics & Gynecology

## 2023-11-15 ENCOUNTER — Encounter: Admitting: Physical Therapy

## 2023-11-21 DIAGNOSIS — R1011 Right upper quadrant pain: Secondary | ICD-10-CM | POA: Diagnosis not present

## 2023-11-21 DIAGNOSIS — K829 Disease of gallbladder, unspecified: Secondary | ICD-10-CM | POA: Diagnosis not present

## 2023-11-22 ENCOUNTER — Encounter: Admitting: Physical Therapy

## 2023-11-27 ENCOUNTER — Encounter (HOSPITAL_BASED_OUTPATIENT_CLINIC_OR_DEPARTMENT_OTHER): Payer: Self-pay | Admitting: Obstetrics & Gynecology

## 2023-11-29 ENCOUNTER — Encounter: Admitting: Physical Therapy

## 2023-11-29 DIAGNOSIS — K829 Disease of gallbladder, unspecified: Secondary | ICD-10-CM | POA: Diagnosis not present

## 2023-11-29 DIAGNOSIS — K8050 Calculus of bile duct without cholangitis or cholecystitis without obstruction: Secondary | ICD-10-CM | POA: Diagnosis not present

## 2023-11-29 NOTE — Progress Notes (Addendum)
 Assessment & Plan 1. Gallbladder dysfunction. Patient has textbook symptoms for gallbladder disease and biliary colic.  Epigastric right upper quadrant abdominal pain, progressively worsening, associate with fatty meals, radiating to back.  Nausea vomiting bloating diarrhea.    Imaging in the form of a CT scan did not show actual gallstones but showed severely distended gallbladder.  I suspect if we order a HIDA scan to show show either hyperkinesia or dyskinesia.    We discussed ordering the HIDA scan to confirm, and we can order this today but I suspect this is all biliary colic and I do offer her procedure to avoid potentially expensive imaging.  I think either option is fine.  She wants to pursue cholecystectomy given that this is most likely her issue.  She also has family history of gallbladder disease and now that she is made dietary changes in terms of low-fat diet her symptoms have already started to improve.  This further confirms likely biliary colic.    We discussed robotic cholecystectomy, procedure involves four small incisions, use of the Da Vinci robot for precision, and is typically an outpatient procedure lasting about 30 minutes. Post-surgery, the patient should follow a low-fat diet for 8 to 12 weeks before gradually reintroducing fatty foods. The patient can resume Wegovy  medication after surgery.  Thus plans for robotic cholecystectomy.  Risks, benefits, and alternatives of the procedure were discussed in detail, including the potential for diarrhea with high-fat foods post-surgery due to reduced bile storage capacity and thus to stay on a low-fat diet.  All questions answered.  Patient understands agrees and wishes to proceed.  We will schedule her surgery today.    I have personally spent 45 minutes involved in face-to-face and non-face-to-face activities for this patient on the day of the visit. Professional time spent includes the following activities, in addition to those noted  in the documentation: Reviewing notes prior to visit, reviewing all available labs & imaging, then discussing the case with the patient, performing a physical exam, and finally discussing surgical options, postoperative care/diet/management, etc., followed by documentation, completing notes on time and placing any relevant orders.  Computer technology was used to create this visit note.  Consent from patient and/or caregiver was obtained prior to its use   History of Present Illness The patient is here for gallbladder-related pain. She started experiencing abdominal pain, associated nausea, and vomiting typically after consuming fried chicken, fish, and other fried foods. Due to the progression of pain, she visited the ER on 08/05/2023, where a CT scan was performed. The CT scan was fairly normal, revealing a 3.1 cm right adnexal cyst likely of ovarian origin, with no follow-up recommended. Her gallbladder was severely distended but showed no gallstones, gallbladder wall thickening, or biliary duct dilatation. There were no liver abnormalities, and her pancreas appeared normal. Laboratory data noted elevated lipase and alkaline phosphatase levels, with normal bilirubin.  She began experiencing severe right upper quadrant pain on 08/05/2023, which radiated to her back but not her shoulder. The pain was so intense that it caused difficulty in standing and breathing. She also experienced nausea but did not vomit. In an attempt to alleviate the symptoms, she induced vomiting, suspecting food poisoning, but this only exacerbated her condition. Her sister suggested using ibuprofen  and hydrocodone  from a previous surgery, which provided relief. This led her to believe the issue was not stomach-related but possibly due to her gallbladder. She reviewed her CT scan results on MyChart, which indicated a distended gallbladder without  gallstones. Since March 2025, she has been experiencing these episodes 2 to 3 times a  week. However, the frequency has decreased since she visited urgent care 3 to 4 weeks ago and was advised to follow a gallbladder diet. She has been avoiding chicken skin and seafood, which seems to have improved her condition. Despite this, the pain remains severe when it occurs. She reports no history of abdominal surgeries, C-sections, hysterectomy, or appendectomy. She does not take any blood thinners and reports no heart or lung issues or diabetes. She was prescribed medication for irritable bowel syndrome, but it did not provide relief. She is interested in having her gallbladder removed and is committed to maintaining a healthy diet. She has no history of hernias.  She had her tubes removed due to a high risk of miscarriage following a cervical cut during labor. She underwent uterine ablation on 10/30/2023.  PAST SURGICAL HISTORY: - Tubal removal - Uterine ablation on 10/30/2023  SOCIAL HISTORY Marital Status: Married Diet: Avoiding skin on chicken, no seafood, and following a gallbladder diet. Alcohol: Does not drink alcohol. Tobacco: Does not smoke cigarettes.    Past Medical History Past Medical History:  Diagnosis Date  . Graves disease   . Hypertension     Past Surgical History Past Surgical History:  Procedure Laterality Date  . Cyst removal Left   . Hemorroidectomy    . Thyroidectomy  2018    Allergies Atenolol , Methimazole , Penicillins, and Sulfa antibiotics  Medications Current Outpatient Medications  Medication Sig Dispense Refill  . ergocalciferol  (VITAMIN D2) 50,000 units CAPS capsule Take one capsule (50,000 Units dose) by mouth once a week at 0900. 12 capsule 1  . ipratropium-albuterol  (COMBIVENT RESPIMAT) 20-100 MCG/ACT inhaler Inhale one puff into the lungs 30 (thirty) minutes before meals & at bedtime.    SABRA levofloxacin (LEVAQUIN) 500 mg tablet Take one tablet (500 mg dose) by mouth daily.    . levothyroxine  sodium (SYNTHROID ,LEVOTHROID,LEVOXYL ) 100 mcg  tablet Take 112 mcg by mouth 1 (one) time each day before breakfast.    . metoprolol  tartrate (LOPRESSOR ) 25 mg tablet Take one tablet (25 mg dose) by mouth as needed.    . naproxen  sodium (ALEVE ) 220 mg tablet Take one tablet (220 mg dose) by mouth daily as needed (pain).    . predniSONE (DELTASONE) 20 mg tablet Take one tablet (20 mg dose) by mouth daily. Taper    . WEGOVY  2.4 MG/0.75ML SOAJ injection Inject 0.75 mLs (2.4 mg dose) into the skin once a week. (Patient not taking: Reported on 11/29/2023) 3 mL 0   No current facility-administered medications for this visit.    Family History Family History  Problem Relation Age of Onset  . Hypertension Mother   . Diabetes Mother   . Hypertension Father     Social History: Social History   Socioeconomic History  . Marital status: Unknown  Tobacco Use  . Smoking status: Never    Passive exposure: Never  . Smokeless tobacco: Never  Vaping Use  . Vaping status: Never Used  Substance and Sexual Activity  . Alcohol use: Never  . Drug use: Never  . Sexual activity: Not Currently    Review of Systems: A complete review of systems was performed; pertinent positives are mentioned in the HPI, otherwise negative.  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   Neurological - alert, oriented, normal speech, no focal findings or movement disorder noted Musculoskeletal - no joint tenderness, deformity or swelling Extremities - peripheral pulses normal, no pedal edema, no clubbing or cyanosis Skin - normal coloration and turgor, no rashes, no suspicious skin lesions noted

## 2023-12-01 DIAGNOSIS — Z419 Encounter for procedure for purposes other than remedying health state, unspecified: Secondary | ICD-10-CM | POA: Diagnosis not present

## 2023-12-03 ENCOUNTER — Other Ambulatory Visit (HOSPITAL_BASED_OUTPATIENT_CLINIC_OR_DEPARTMENT_OTHER): Payer: Self-pay | Admitting: Obstetrics & Gynecology

## 2023-12-03 MED ORDER — TRAMADOL HCL 50 MG PO TABS: ORAL_TABLET | ORAL | 0 refills | Status: AC

## 2023-12-03 MED ORDER — DIAZEPAM 10 MG PO TABS: 5.0000 mg | ORAL_TABLET | Freq: Three times a day (TID) | ORAL | 0 refills | Status: DC | PRN

## 2023-12-04 ENCOUNTER — Encounter: Admitting: Physical Therapy

## 2023-12-04 NOTE — Telephone Encounter (Signed)
 Late Entry.  Called pt on 12/03/2023 to discuss cramping symptoms.  Not really having much bleeding but a lot of cramping.  Tylenol  and motrin  are not helping.  Advised I cannot call in narcotic pain medication for her due to [place] Stop Act but could send in rx for tramadol  50mg  1-2 table every 6 hours as needed as well as valium  5mg  every 8 hours as needed.  Advised to use the tramadol  first and then only add the valium  if needed after more than 1 hour.  Voiced understanding and appreciation for phone call.

## 2023-12-13 ENCOUNTER — Encounter: Admitting: Physical Therapy

## 2023-12-13 NOTE — Progress Notes (Signed)
 Office Visit Note  Patient: [name] Curry             Date of Birth: 21-Aug-1980           MRN: [id number]             PCP: Oris Camie BRAVO, NP Referring: Addie Cordella Hamilton, MD Visit Date: 12/27/2023 Occupation: @GUAROCC @  Subjective:  Pain in joints and muscles   History of Present Illness: [name] Curry is a 43 y.o. female seen for the evaluation of sacroiliitis.  According to the patient her symptoms started in 2023 with pressure and discomfort in her groin area.  She states the discomfort will get worse during the sitting and standing position.  She states she got pregnant in 2024 and the discomfort increased.  She delivered a healthy child in May 2024.  She states even after the delivery of the child she continued to have pain and discomfort in her hips and groin area.  She states the pain gets worse after walking about 15 to 20 minutes.  And also with prolonged sitting.  She was evaluated by Dr. Addie and had physical therapy without much relief.  He also did labs which showed mildly elevated C-reactive protein.  An MRI of the pelvis was obtained which showed bilateral sacroiliitis and subchondral sclerosis.  Small fluid was also noted in the pubic symphysis with subchondral marrow edema.  Patient states she has taken ibuprofen  intermittently which helps to some extent.  She has also been given tramadol  and hydrocodone  in the past.  She reports some discomfort in her neck and lower back.  None of the other joints are painful.  She developed carpal tunnel syndrome symptoms during the pregnancy which resolved after the injection and the delivery of the child.  She had no recurrence of symptoms.  She denies any history of psoriasis, chronic diarrhea or constipation.  There is no history of plantar fasciitis, Achilles tendinitis or uveitis.  There is no history of joint swelling. She is right-handed currently unemployed.  She is married, gravida 1, para 1.  There is no history of preeclampsia or  DVTs.  She is status post tubal ligation.  She does not drink any alcohol.  She smoked 1/3 pack/day for 20 years and quit smoking in September 2023.  There is no family history of inflammatory bowel disease, psoriasis or other autoimmune disease. She is scheduled for cholecystectomy on December 31, 2023.    Activities of Daily Living:  Patient reports morning stiffness for 0 minute.   Patient Denies nocturnal pain.  Difficulty dressing/grooming: Denies Difficulty climbing stairs: Denies Difficulty getting out of chair: Denies Difficulty using hands for taps, buttons, cutlery, and/or writing: Denies  Review of Systems  Constitutional:  Negative for fatigue.  HENT:  Negative for mouth sores and mouth dryness.   Eyes:  Negative for dryness.  Respiratory:  Negative for shortness of breath.   Cardiovascular:  Negative for chest pain and palpitations.  Gastrointestinal:  Negative for blood in stool, constipation and diarrhea.  Endocrine: Positive for increased urination.  Genitourinary:  Negative for involuntary urination.  Musculoskeletal:  Positive for joint pain, joint pain, myalgias, muscle weakness, muscle tenderness and myalgias. Negative for gait problem, joint swelling and morning stiffness.  Skin:  Negative for color change, rash, hair loss and sensitivity to sunlight.  Allergic/Immunologic: Negative for susceptible to infections.  Neurological:  Negative for dizziness and headaches.  Hematological:  Negative for swollen glands.  Psychiatric/Behavioral:  Negative for  depressed mood and sleep disturbance. The patient is not nervous/anxious.     PMFS History:  Patient Active Problem List   Diagnosis Date Noted   Menorrhagia with irregular cycle 10/30/2023   Non-cardiac chest pain 05/03/2023   Pain, joint, pelvic region, left 05/03/2023   Penicillin allergy 07/27/2022   Bilateral carpal tunnel syndrome 06/19/2022   Muscle spasms of neck 02/06/2022   Beta thalassemia trait  10/07/2021   NSVT (nonsustained ventricular tachycardia) (HCC) 10/07/2021   Pre-diabetes 07/22/2021   Vitamin D  deficiency 07/22/2021   Mixed hyperlipidemia 07/22/2021   BMI 36.0-36.9,adult 07/14/2021   Family history of breast cancer in sister 10/22/2019   H/O Graves' disease 09/05/2016   Postsurgical hypothyroidism 12/01/2013    Past Medical History:  Diagnosis Date   Anemia    Anxiety    Arthritis    both hips   Beta thalassemia trait    Depression    Heart palpitations    History of gestational diabetes    History of Graves' disease 2015   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   History of pregnancy induced hypertension    History of suicidal behavior 07/2011   [facility] admission   Hyperlipidemia, mixed    Hypothyroidism, postsurgical 2015   followed by pcp  (previous endocrinologist--- dr trixie)  dx 2015  graves dx w/ toxic goiter  s/p  RAI 08-22-2013;   patient taking synthroid  became toxic 2017;   06-30-2016  s/p total thyroidectomy   Menorrhagia with irregular cycle    NSVT (nonsustained ventricular tachycardia) (HCC)    Sickle cell trait (HCC)    URI with cough and congestion 10/04/2023   10-18-2023  today pt stated has 2 days left of antibiotic and used rescue inhaler 3 days ago but no symptoms past 5 days;;   pt had UC visit @ [facility] [place] (office visit note in epic)  flu-like symptoms w/ productive cough , sob, sore throat;  dx acute pharygitis, acute cough, sob;  given antibiotic    Family History  Problem Relation Age of Onset   Hypertension Mother    Arthritis Mother    Hypertension Father    Hyperlipidemia Father    Congenital heart disease Sister    Breast cancer Sister 81   Diabetes Maternal Grandmother    Graves' disease Maternal Grandmother    Alzheimer's disease Maternal Grandmother    Diabetes Paternal Grandmother    Hypertension Paternal Grandmother    Cancer Paternal Grandfather        lung   Diabetes Other    Past  Surgical History:  Procedure Laterality Date   DILATATION & CURRETTAGE/HYSTEROSCOPY WITH RESECTOCOPE N/A 10/30/2023   Procedure: DILATATION & CURETTAGE/HYSTEROSCOPY;  Surgeon: Cleotilde Ronal RAMAN, MD;  Location: [facility] OR;  Service: Gynecology;  Laterality: N/A;   EXCISIONAL HEMORRHOIDECTOMY  1996   HYDRADENITIS EXCISION Left 12/13/2021   Procedure: EXCISION HIDRADENITIS LEFT AXILLA;  Surgeon: Vernetta Berg, MD;  Location: [facility];  Service: General;  Laterality: Left;   HYDROTHERMAL ABLATION/NOVASURE N/A 10/30/2023   Procedure: DELBERTA ABLATION;  Surgeon: Cleotilde Ronal RAMAN, MD;  Location: [facility] OR;  Service: Gynecology;  Laterality: N/A;   LAPAROSCOPIC BILATERAL SALPINGECTOMY Bilateral 02/14/2023   Procedure: LAPAROSCOPIC BILATERAL SALPINGECTOMY;  Surgeon: Cleotilde Ronal RAMAN, MD;  Location: [facility];  Service: Gynecology;  Laterality: Bilateral;   THYROIDECTOMY N/A 06/30/2016   Procedure: TOTAL THYROIDECTOMY;  Surgeon: Krystal Spinner, MD;  Location: [facility] OR;  Service:  General;  Laterality: N/A;   Social History   Social History Narrative   Not on file   Immunization History  Administered Date(s) Administered   Influenza,inj,Quad PF,6+ Mos 07/01/2016, 02/27/2018, 03/23/2021, 04/04/2022   Influenza-Unspecified 07/01/2016, 02/27/2018   PFIZER Comirnaty(Gray Top)Covid-19 Tri-Sucrose Vaccine 09/30/2020   PFIZER(Purple Top)SARS-COV-2 Vaccination 12/31/2019   Pneumococcal Polysaccharide-23 07/01/2016   Tdap 04/21/2014, 06/30/2022     Objective: Vital Signs: BP 120/83 (BP Location: Left Arm, Patient Position: Sitting, Cuff Size: Normal)   Pulse 74   Resp 14   Ht 5' 3.5 (1.613 m)   Wt 218 lb (98.9 kg)   Breastfeeding No   BMI 38.01 kg/m    Physical Exam Vitals and nursing note reviewed.  Constitutional:      Appearance: She is well-developed.  HENT:     Head: Normocephalic and atraumatic.  Eyes:     Conjunctiva/sclera: Conjunctivae normal.  Cardiovascular:      Rate and Rhythm: Normal rate and regular rhythm.     Heart sounds: Normal heart sounds.  Pulmonary:     Effort: Pulmonary effort is normal.     Breath sounds: Normal breath sounds.  Abdominal:     General: Bowel sounds are normal.     Palpations: Abdomen is soft.  Musculoskeletal:     Cervical back: Normal range of motion.  Lymphadenopathy:     Cervical: No cervical adenopathy.  Skin:    General: Skin is warm and dry.     Capillary Refill: Capillary refill takes less than 2 seconds.  Neurological:     Mental Status: She is alert and oriented to person, place, and time.  Psychiatric:        Behavior: Behavior normal.      Musculoskeletal Exam: Cervical spine was in good range of motion.  She had discomfort with range of motion of her thoracic and lumbar spine.  She had mild tenderness over thoracic region.  Mild tenderness over lower lumbar region.  She had tenderness over bilateral SI joints.  Some discomfort with range of motion of her left shoulder joint.  Tenderness over right lateral epicondyle and right medial epicondyle region.  No synovitis was noted over the elbow joints.  There was no synovitis of her wrist joints, MCPs P or PIP joints.  She had painful range of motion of bilateral hip joints more on the right side.  Knee joints in good range of motion without any warmth swelling or effusion.  There was no plantar fasciitis or Achilles tendinitis.  There was no synovitis of her ankles or MTPs.  CDAI Exam: CDAI Score: -- Patient Global: --; Provider Global: -- Swollen: --; Tender: -- Joint Exam 12/27/2023   No joint exam has been documented for this visit   There is currently no information documented on the homunculus. Go to the Rheumatology activity and complete the homunculus joint exam.  Investigation: No additional findings.  Imaging: No results found.  Recent Labs: Lab Results  Component Value Date   WBC 8.8 10/30/2023   HGB 12.4 10/30/2023   PLT 391  10/30/2023   NA 138 10/30/2023   K 3.7 10/30/2023   CL 106 10/30/2023   CO2 26 10/30/2023   GLUCOSE 94 10/30/2023   BUN 6 10/30/2023   CREATININE 1.02 (H) 10/30/2023   BILITOT 0.5 08/05/2023   ALKPHOS 91 08/05/2023   AST 13 (L) 08/05/2023   ALT 17 08/05/2023   PROT 8.0 08/05/2023   ALBUMIN 4.9 08/05/2023   CALCIUM  8.9 10/30/2023  GFRAA >60 12/11/2017     Speciality Comments: No specialty comments available.  Procedures:  No procedures performed Allergies: Atenolol , Methimazole , Penicillins, and Sulfa antibiotics   Assessment / Plan:     Visit Diagnoses: Sacroiliitis (HCC) -patient has been experiencing severe pain and discomfort since 2023 which has been progressively getting worse.  She describes discomfort in the both SI joints, bilateral hip joints and pubic region.  She has difficulty walking for more than 15 minutes.  She also has difficulty with prolonged sitting.  She has 34-year-old child and has difficulty doing routine activities.  She is unable to work.  She was evaluated by Dr. Addie and had x-rays and MRI of her pelvis. IMPRESSION: 1. Bilateral sacroiliitis with subchondral sclerosis bilaterally and mild subchondral marrow edema along the sacral aspect bilaterally. Differential considerations include an inflammatory or crystalline arthropathy, ankylosing spondylitis, osteoarthritis, versus enteropathic arthritis amongst other etiologies. 2. Small amount of fluid in the pubic symphysis with subchondral marrow edema bilaterally as can be seen with osteitis pubis. 3. No hip fracture, dislocation or avascular necrosis.  Electronically Signed   By: Julaine Blanch M.D.   On: 07/08/2023 09:16  plan: HLA-B27 antigen July 23, 2023 sed rate 2, hs-CRP 3.8, ANA negative, RF negative, anti-CCP negative. Patient has been taking ibuprofen  800 mg 3 times daily which helped to some extent but not relieving her pain.  I will give her a prescription for meloxicam  15 mg p.o. daily.   Patient is having cholecystectomy next week.  I advised her to have repeat LFTs after cholecystectomy and if her liver functions are normal she may start meloxicam  50 mg p.o. daily.  Side effect of meloxicam  including the risk of GI bleed, elevation of LFTs and creatinine was also discussed.  We may have to consider use of Biologics if her symptoms do not respond to NSAIDs.  Muscle spasms of neck-she reports muscle spasms in her neck.  She had good range of motion of the cervical spine.  Pain in thoracic spine -she complains of thoracic discomfort.  Plan: XR Thoracic Spine 2 View.  X-rays of thoracic spine were unremarkable.  Chronic midline low back pain without sciatica -planes of lower back pain.  Plan: XR Lumbar Spine 2-3 Views.  X-rays of the lumbar spine were unremarkable.  Polyarthralgia-she complains of thoracic and lumbar spine pain.  She also has some discomfort in left shoulder, right lateral and medial epicondyle region.  Based on history of plantar fasciitis, Achilles tendinitis.  There is no history of uveitis.  High risk medication use -December 25, 2023 CBC normal MCV 75.1, CMP normal except alk phos 130. Plan: Hepatitis B core antibody, IgM, Hepatitis B surface antigen, Hepatitis C antibody, QuantiFERON-TB Gold Plus, Serum protein electrophoresis with reflex, IgG, IgA, IgM, Glucose 6 phosphate dehydrogenase  Status post tubal ligation  Bilateral carpal tunnel syndrome - s/p injection 09/2022. Symptoms resolved after the injection.  Patient states she developed the symptoms during pregnancy.  Other medical problems listed as follows:  Postsurgical hypothyroidism - 2018 for hyperthyroidism.  Mixed hyperlipidemia  Prediabetes  Beta thalassemia trait  NSVT (nonsustained ventricular tachycardia) (HCC) - on Metoprolol  prn.  Vitamin D  deficiency  Menorrhagia with irregular cycle - s/p ablation 10/2023  BMI 38.0-38.9,adult  Former smoker - smoked 1/3 pack/day for 20 years and  quit smoking in September 2023  Family history of breast cancer in sister  Orders: Orders Placed This Encounter  Procedures   XR Thoracic Spine 2 View   XR Lumbar  Spine 2-3 Views   Hepatitis B core antibody, IgM   Hepatitis B surface antigen   Hepatitis C antibody   QuantiFERON-TB Gold Plus   Serum protein electrophoresis with reflex   IgG, IgA, IgM   Glucose 6 phosphate dehydrogenase   HLA-B27 antigen   Meds ordered this encounter  Medications   meloxicam  (MOBIC ) 15 MG tablet    Sig: Take 1 tablet (15 mg total) by mouth daily.    Dispense:  30 tablet    Refill:  1    Face-to-face time spent with patient was over 45 minutes. Greater than 50% of time was spent in counseling and coordination of care.  Follow-Up Instructions: Return for Sacroiliitis.   Maya Nash, MD  Note - This record has been created using Animal nutritionist.  Chart creation errors have been sought, but may not always  have been located. Such creation errors do not reflect on  the standard of medical care.

## 2023-12-20 ENCOUNTER — Encounter: Admitting: Physical Therapy

## 2023-12-25 ENCOUNTER — Encounter: Admitting: Physical Therapy

## 2023-12-25 DIAGNOSIS — Z01818 Encounter for other preprocedural examination: Secondary | ICD-10-CM | POA: Diagnosis not present

## 2023-12-27 ENCOUNTER — Ambulatory Visit: Attending: Rheumatology | Admitting: Rheumatology

## 2023-12-27 ENCOUNTER — Encounter: Payer: Self-pay | Admitting: Rheumatology

## 2023-12-27 ENCOUNTER — Ambulatory Visit (INDEPENDENT_AMBULATORY_CARE_PROVIDER_SITE_OTHER)

## 2023-12-27 VITALS — BP 120/83 | HR 74 | Resp 14 | Ht 63.5 in | Wt 218.0 lb

## 2023-12-27 DIAGNOSIS — M461 Sacroiliitis, not elsewhere classified: Secondary | ICD-10-CM | POA: Insufficient documentation

## 2023-12-27 DIAGNOSIS — G8929 Other chronic pain: Secondary | ICD-10-CM

## 2023-12-27 DIAGNOSIS — I4729 Other ventricular tachycardia: Secondary | ICD-10-CM | POA: Insufficient documentation

## 2023-12-27 DIAGNOSIS — M546 Pain in thoracic spine: Secondary | ICD-10-CM

## 2023-12-27 DIAGNOSIS — E890 Postprocedural hypothyroidism: Secondary | ICD-10-CM | POA: Insufficient documentation

## 2023-12-27 DIAGNOSIS — M545 Low back pain, unspecified: Secondary | ICD-10-CM | POA: Diagnosis not present

## 2023-12-27 DIAGNOSIS — R7303 Prediabetes: Secondary | ICD-10-CM | POA: Diagnosis present

## 2023-12-27 DIAGNOSIS — Z9851 Tubal ligation status: Secondary | ICD-10-CM | POA: Insufficient documentation

## 2023-12-27 DIAGNOSIS — Z6838 Body mass index (BMI) 38.0-38.9, adult: Secondary | ICD-10-CM | POA: Diagnosis present

## 2023-12-27 DIAGNOSIS — N921 Excessive and frequent menstruation with irregular cycle: Secondary | ICD-10-CM | POA: Insufficient documentation

## 2023-12-27 DIAGNOSIS — D563 Thalassemia minor: Secondary | ICD-10-CM | POA: Insufficient documentation

## 2023-12-27 DIAGNOSIS — M62838 Other muscle spasm: Secondary | ICD-10-CM | POA: Diagnosis not present

## 2023-12-27 DIAGNOSIS — E559 Vitamin D deficiency, unspecified: Secondary | ICD-10-CM | POA: Insufficient documentation

## 2023-12-27 DIAGNOSIS — E782 Mixed hyperlipidemia: Secondary | ICD-10-CM | POA: Insufficient documentation

## 2023-12-27 DIAGNOSIS — G5603 Carpal tunnel syndrome, bilateral upper limbs: Secondary | ICD-10-CM | POA: Insufficient documentation

## 2023-12-27 DIAGNOSIS — M2550 Pain in unspecified joint: Secondary | ICD-10-CM | POA: Insufficient documentation

## 2023-12-27 DIAGNOSIS — Z87891 Personal history of nicotine dependence: Secondary | ICD-10-CM | POA: Diagnosis present

## 2023-12-27 DIAGNOSIS — Z79899 Other long term (current) drug therapy: Secondary | ICD-10-CM | POA: Diagnosis not present

## 2023-12-27 DIAGNOSIS — Z803 Family history of malignant neoplasm of breast: Secondary | ICD-10-CM | POA: Insufficient documentation

## 2023-12-27 DIAGNOSIS — Z6836 Body mass index (BMI) 36.0-36.9, adult: Secondary | ICD-10-CM

## 2023-12-27 MED ORDER — MELOXICAM 15 MG PO TABS: 15.0000 mg | ORAL_TABLET | Freq: Every day | ORAL | 1 refills | Status: AC

## 2023-12-27 NOTE — Patient Instructions (Signed)
 Meloxicam  Capsules What is this medication? MELOXICAM  (mel OX i cam) treats mild to moderate pain, inflammation, or arthritis. It works by decreasing inflammation. It belongs to a group of medications called NSAIDs. This medicine may be used for other purposes; ask your health care provider or pharmacist if you have questions. COMMON BRAND NAME(S): Vivlodex  What should I tell my care team before I take this medication? They need to know if you have any of these conditions: Asthma (lung or breathing disease) Bleeding disorder Coronary artery bypass graft (CABG) within the past 2 weeks Dehydration Frequently drink alcohol Heart attack Heart disease Heart failure High blood pressure Kidney disease Liver disease Stomach bleeding Stomach ulcers, other stomach or intestine problems Take medications that treat or prevent blood clots Taking other steroids, such as dexamethasone  or prednisone Tobacco use An unusual or allergic reaction to meloxicam , other medications, foods, dyes, or preservatives Pregnant or trying to get pregnant Breast-feeding How should I use this medication? Take this medication by mouth. Take it as directed on the prescription label at the same time every day. You can take it with or without food. If it upsets your stomach, take it with food. Do not use it more often than directed. There may be unused or extra doses in the bottle after you finish your treatment. Talk to your care team if you have questions about your dose. A special MedGuide will be given to you by the pharmacist with each prescription and refill. Be sure to read this information carefully each time. Talk to your care team about the use of this medication in children. Special care may be needed. People over 59 years of age may have a stronger reaction and need a smaller dose. Overdosage: If you think you have taken too much of this medicine contact a poison control center or emergency room at once. NOTE:  This medicine is only for you. Do not share this medicine with others. What if I miss a dose? If you miss a dose, take it as soon as you can. If it is almost time for your next dose, take only that dose. Do not take double or extra doses. What may interact with this medication? Do not take this medication with any of the following: Cidofovir Ketorolac  This medication may also interact with the following: Alcohol Aspirin  and aspirin -like medications Certain medications for blood pressure, heart disease, irregular heart beat Certain medications for mental health conditions Certain medications that treat or prevent blood clots, such as warfarin, enoxaparin, dalteparin, apixaban, dabigatran, rivaroxaban Cyclosporine Diuretics Fluconazole Lithium Methotrexate Other NSAIDs, medications for pain and inflammation, such as ibuprofen  and naproxen  Pemetrexed This list may not describe all possible interactions. Give your health care provider a list of all the medicines, herbs, non-prescription drugs, or dietary supplements you use. Also tell them if you smoke, drink alcohol, or use illegal drugs. Some items may interact with your medicine. What should I watch for while using this medication? Visit your care team for regular checks on your progress. Tell your care team if your symptoms do not start to get better or if they get worse. Do not take other medications that contain aspirin , ibuprofen , or naproxen  with this medication. Side effects such as stomach upset, nausea, or ulcers may be more likely to occur. Many non-prescription medications contain aspirin , ibuprofen , or naproxen . Always read labels carefully. This medication can cause serious ulcers and bleeding in the stomach. It can happen with no warning. Tobacco, alcohol, older age, and poor health  can also increase risks. Call your care team right away if you have stomach pain or blood in your vomit or stool. This medication does not prevent a  heart attack or stroke. This medication may increase the chance of a heart attack or stroke. The chance may increase the longer you use this medication or if you have heart disease. If you take aspirin  to prevent a heart attack or stroke, talk to your care team about using this medication. This medication may cause serious skin reactions. They can happen weeks to months after starting the medication. Contact your care team right away if you notice fevers or flu-like symptoms with a rash. The rash may be red or purple and then turn into blisters or peeling of the skin. Or, you might notice a red rash with swelling of the face, lips or lymph nodes in your neck or under your arms. Talk to your care team if you wish to become pregnant or think you might be pregnant. This medication can cause serious birth defects. This medication may affect your coordination, reaction time, or judgment. Do not drive or operate machinery until you know how this medication affects you. Sit up or stand slowly to reduce the risk of dizzy or fainting spells. Drinking alcohol with this medication can increase the risk of these side effects. Be careful brushing or flossing your teeth or using a toothpick because you may get an infection or bleed more easily. If you have any dental work done, tell your dentist you are receiving this medication. This medication may make it more difficult to get pregnant. Talk to your care team if you are concerned about your fertility. What side effects may I notice from receiving this medication? Side effects that you should report to your care team as soon as possible: Allergic reactions--skin rash, itching, hives, swelling of the face, lips, tongue, or throat Bleeding--bloody or black, tar-like stools, vomiting blood or brown material that looks like coffee grounds, red or dark brown urine, small red or purple spots on skin, unusual bruising or bleeding Heart attack--pain or tightness in the chest,  shoulders, arms, or jaw, nausea, shortness of breath, cold or clammy skin, feeling faint or lightheaded Heart failure--shortness of breath, swelling of the ankles, feet, or hands, sudden weight gain, unusual weakness or fatigue Increase in blood pressure Kidney injury--decrease in the amount of urine, swelling of the ankles, hands, or feet Liver injury--right upper belly pain, loss of appetite, nausea, light-colored stool, dark yellow or brown urine, yellowing skin or eyes, unusual weakness or fatigue Rash, fever, and swollen lymph nodes Redness, blistering, peeling, or loosening of the skin, including inside the mouth Round red or dark patches on the skin that may itch, burn, and blister Stroke--sudden numbness or weakness of the face, arm, or leg, trouble speaking, confusion, trouble walking, loss of balance or coordination, dizziness, severe headache, change in vision Side effects that usually do not require medical attention (report these to your care team if they continue or are bothersome): Headache Loss of appetite Nausea Upset stomach This list may not describe all possible side effects. Call your doctor for medical advice about side effects. You may report side effects to FDA at 1-800-FDA-1088. Where should I keep my medication? Keep out of the reach of children and pets. Store at room temperature between 20 and 25 degrees C (68 and 77 degrees F). Keep this medication in the original container. Protect from moisture. Keep the container tightly closed. Get rid of  any unused medication after the expiration date. To get rid of medications that are no longer needed or have expired: Take the medication to a medication take-back program. Check with your pharmacy or law enforcement to find a location. If you cannot return the medication, check the label or package insert to see if the medication should be thrown out in the garbage or flushed down the toilet. If you are not sure, ask your care  team. If it is safe to put it in the trash, empty the medication out of the container. Mix the medication with cat litter, dirt, coffee grounds, or other unwanted substance. Seal the mixture in a bag or container. Put it in the trash. NOTE: This sheet is a summary. It may not cover all possible information. If you have questions about this medicine, talk to your doctor, pharmacist, or health care provider.  2025 Elsevier/Gold Standard (2023-05-11 00:00:00) Ankylosing Spondylitis, Adult  Ankylosing spondylitis (AS) is a long-term (chronic) condition that causes inflammation, often in the spine. Over time, the inflammation can make new bone form in the spine. This can result in the spinal joints fusing together (ankylosis) and loss of movement. In some people, inflammation may affect other areas of the body, such as the shoulders, hips, ribs, and small joints in the hands and feet. Sometimes the eyes are affected. Inflammation can also happen in the joints of the back and in the tissues that connect joints to bones (ligaments) or connect muscles to bones (tendons). As the disease gets worse, the joints that connect the spine to the pelvis (sacroiliac or SIjoints) are often affected. The condition can range from mild to severe. You may have more severe AS if you smoke. What are the causes? The exact cause of this condition is not known. It may be caused by abnormal genes that are passed down through families. The most common gene that has been linked to AS produces a protein called HLA-B27. Even if you have genes for AS, they may need to be triggered to become active. Infection may be one trigger. What increases the risk? You are more likely to develop this condition if: You have a family history of AS. You are between the ages of 60 and 73. You are female. You have frequent gastrointestinal infections. What are the signs or symptoms? The most common symptoms are pain and stiffness that get worse with  rest and better with movement. Pain may be worse at night, and stiffness may be worse in the morning. Symptoms also depend on where the inflammation occurs: Inflammation of the SI joints causes pain in the lower back and buttocks. You may also feel pain in your hips. Inflammation in the upper spine, neck, and ribs causes pain in those areas. Rib inflammation may cause shortness of breath. Inflammation in the shoulder, fingers, knees, ankles, toes, or heels may cause pain and stiffness in those areas. Inflammation in the eyes may cause eye pain, redness, or visual problems. Generalized inflammation may cause fever, loss of appetite, and tiredness (fatigue). How is this diagnosed? This condition may be diagnosed based on: Your symptoms and your medical and family history. A physical exam. Tests, such as: X-rays or an MRI to look for joint changes or inflammation. A blood test to check for the protein HLA-B27. You may be referred to a health care provider who specializes in diseases of the bones, muscles, and joints (rheumatologist). How is this treated? There is no cure for this condition, but treatment can  reduce symptoms. Treatment options may include: Medicines, such as: NSAIDs, such as ibuprofen . These may be used first to reduce pain and inflammation. Steroid shots into inflamed joints to help reverse inflammation. Disease-modifying antirheumatic drugs (DMARDs). These may reduce symptoms and slow the progression of the disease. Biologic medicines. These may be used for moderate to severe forms of AS and when other treatments are not helping. These medicines are the most effective but may increase the risk for a serious infection. Physical therapy and physical activity to help keep muscles strong and keep joints from getting stiff. Surgery may be needed for severe joint damage, usually in the hip. This may require hip replacement surgery. Follow these instructions at home: Activity Return  to your normal activities as told by your health care provider. Ask your health care provider what activities are safe for you. Try to get exercise every day. Exercise is very important when you have AS. If you have learned physical therapy exercises, practice them as told by your physical therapist. Take care to avoid falls. When appropriate, use a cane or walker. Remove area rugs from your home. General instructions  Take over-the-counter and prescription medicines only as told by your health care provider. Maintain good posture when standing and sitting. Maintain a healthy weight. Wear shoes that are supportive and well fitting. Do not use any products that contain nicotine  or tobacco. These products include cigarettes, chewing tobacco, and vaping devices, such as e-cigarettes. These can make your condition worse. If you need help quitting, ask your health care provider. Keep all follow-up visits. This is important. Where to find more information Learn as much as you can about AS. You can find support and information from the Spond[employer] [place]: www.spondylitis.org Contact a health care provider if: You are on a biologic medicine and you have a fever or any other signs of infection. You have side effects from any of your medicines. Your symptoms are not improving with medicine or are getting worse. Get help right away if: You have trouble breathing. You have a sudden change in vision. These symptoms may be an emergency. Get help right away. Call 911. Do not wait to see if the symptoms will go away. Do not drive yourself to the hospital. Summary Ankylosing spondylitis (AS) is a long-term condition that causes inflammation, often in the spine. AS may be caused by abnormal genes that can be passed down through families. The most common symptoms of AS are pain and stiffness that get worse with rest and better with movement. There is no cure for AS, but treatment can control  symptoms and slow progression of the disease. NSAIDs, such as ibuprofen , may be the first treatment used. These help reduce pain and inflammation. This information is not intended to replace advice given to you by your health care provider. Make sure you discuss any questions you have with your health care provider. Document Revised: 12/13/2020 Document Reviewed: 12/13/2020 Elsevier Patient Education  2024 [employer].

## 2023-12-28 NOTE — Telephone Encounter (Signed)
 Called pt.  Her cycle has stopped.  Did last about six days.  Has stopped now.  She just wants to watch and monitor for now.  Aware it does take several months to know for sure that the endometrial ablation failed and that we didn't get the typical bleeding improvement response.  Questions answered.

## 2023-12-30 ENCOUNTER — Other Ambulatory Visit: Payer: Self-pay | Admitting: Medical Genetics

## 2023-12-31 DIAGNOSIS — I10 Essential (primary) hypertension: Secondary | ICD-10-CM | POA: Diagnosis not present

## 2023-12-31 DIAGNOSIS — K829 Disease of gallbladder, unspecified: Secondary | ICD-10-CM | POA: Diagnosis not present

## 2023-12-31 DIAGNOSIS — E669 Obesity, unspecified: Secondary | ICD-10-CM | POA: Diagnosis not present

## 2023-12-31 DIAGNOSIS — K8020 Calculus of gallbladder without cholecystitis without obstruction: Secondary | ICD-10-CM | POA: Diagnosis not present

## 2023-12-31 DIAGNOSIS — R109 Unspecified abdominal pain: Secondary | ICD-10-CM | POA: Diagnosis not present

## 2024-01-01 DIAGNOSIS — Z419 Encounter for procedure for purposes other than remedying health state, unspecified: Secondary | ICD-10-CM | POA: Diagnosis not present

## 2024-01-01 IMAGING — MR MR [PERSON_NAME]*[PERSON_NAME]* W/O CM
10 series · 40 of 40 positions shown · non-contrast
Comparison: None.

CLINICAL DATA: Hand fracture, tendon/ligament injury suspected,
xray done Eval for thumb UCL injury

EXAM:
MRI OF THE LEFT HAND WITHOUT CONTRAST
TECHNIQUE: Multiplanar, multisequence MR imaging of the left hand was
performed. No intravenous contrast was administered.

[Series 3: T1 · sagittal · left · 4.0mm · 0.50mm/px · 6 of 39 slices shown (1 of 4)]
[im 1/39]
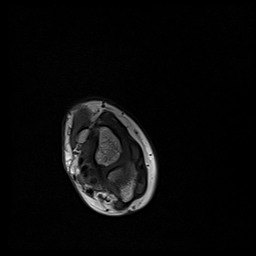
[im 8/39]
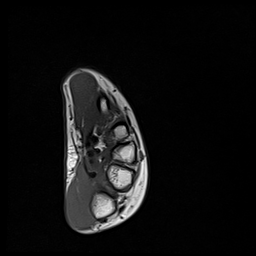
[im 16/39]
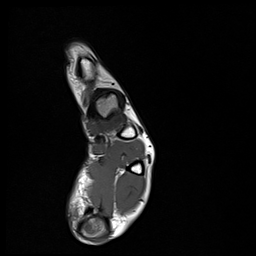
[im 23/39]
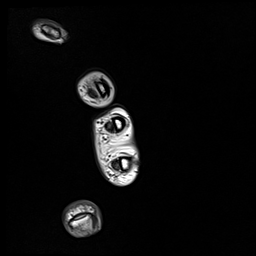
[im 31/39]
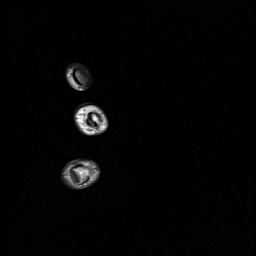
[im 39/39]
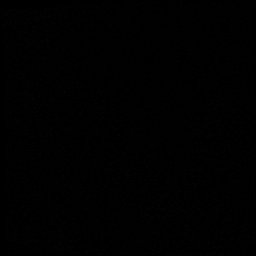

[Series 4: T2 fat-sat · sagittal · left · 4.0mm · 0.47mm/px · 6 of 39 slices shown (1 of 2)]
[im 1/39]
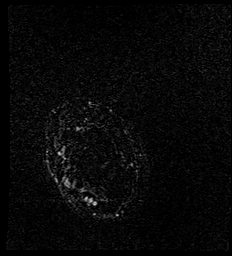
[im 8/39]
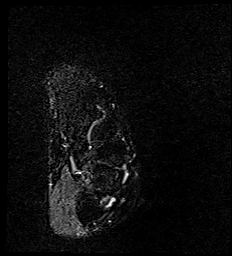
[im 16/39]
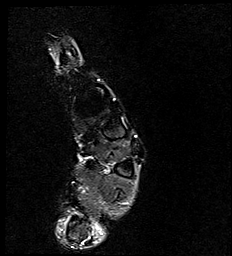
[im 23/39]
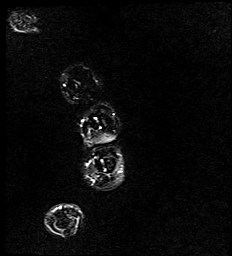
[im 31/39]
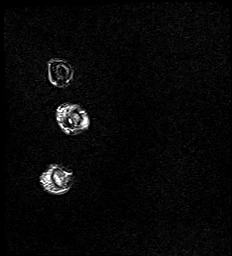
[im 39/39]
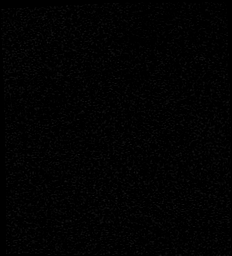

[Series 5: T1 · coronal · left · 3.0mm · 0.62mm/px · 4 of 22 slices shown (2 of 4)]
[im 1/22]
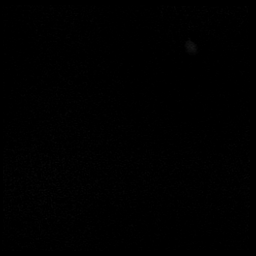
[im 8/22]
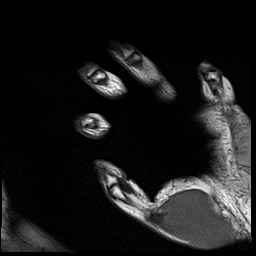
[im 15/22]
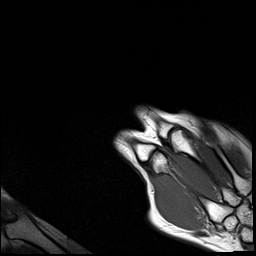
[im 22/22]
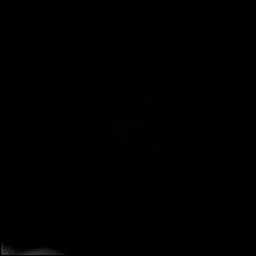

[Series 6: STIR · coronal · left · 3.0mm · 0.62mm/px · 4 of 21 slices shown (1 of 2)]
[im 1/21]
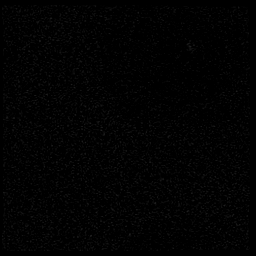
[im 7/21]
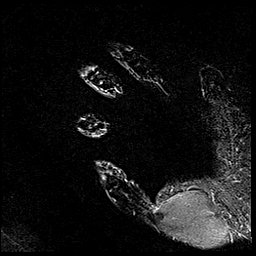
[im 14/21]
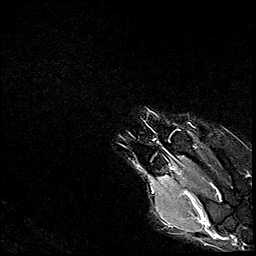
[im 21/21]
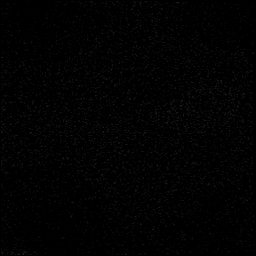

[Series 7: PD fat-sat · oblique · left · 3.0mm · 0.25mm/px · 6 of 34 slices shown (1 of 2)]
[im 1/34]
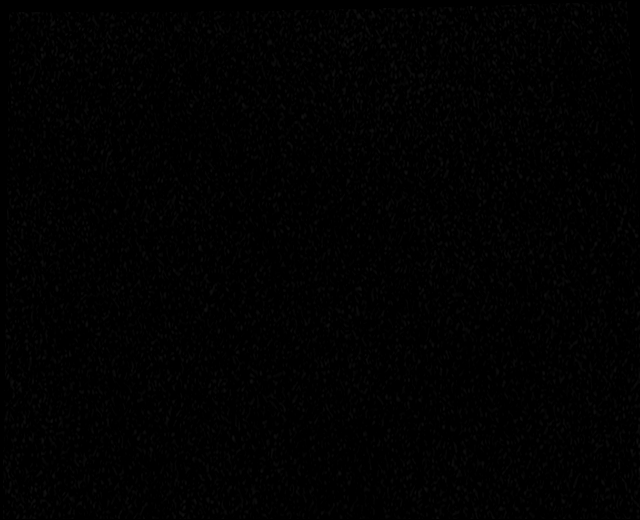
[im 7/34]
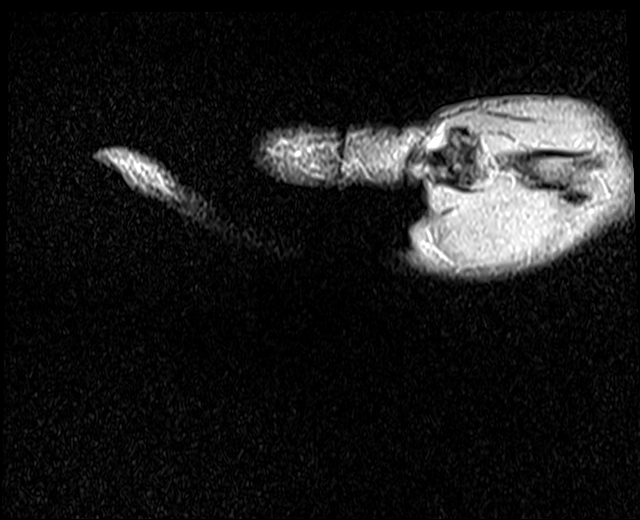
[im 14/34]
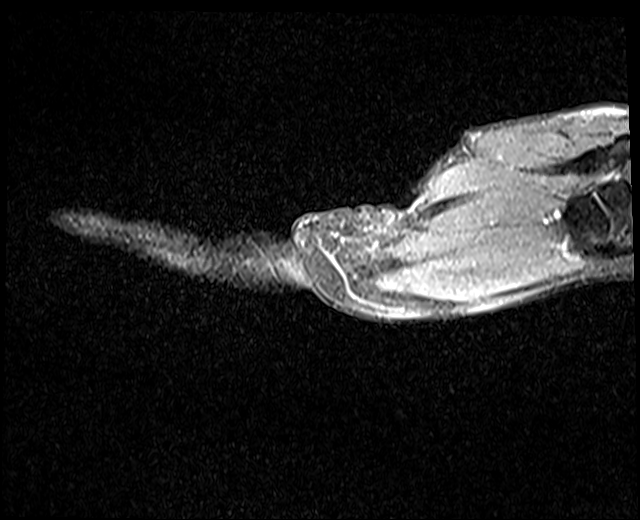
[im 20/34]
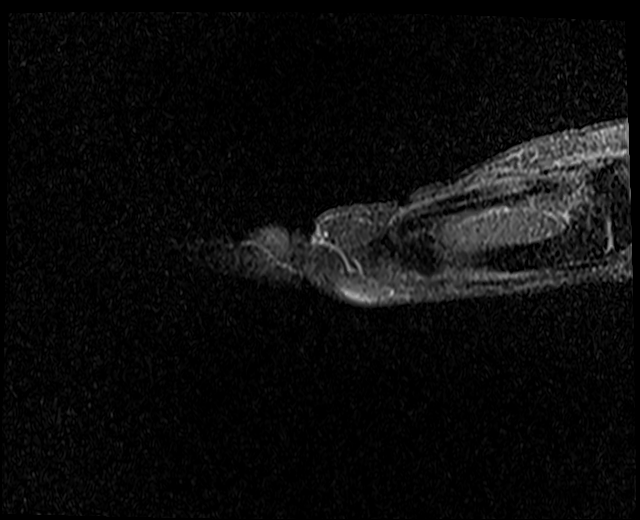
[im 27/34]
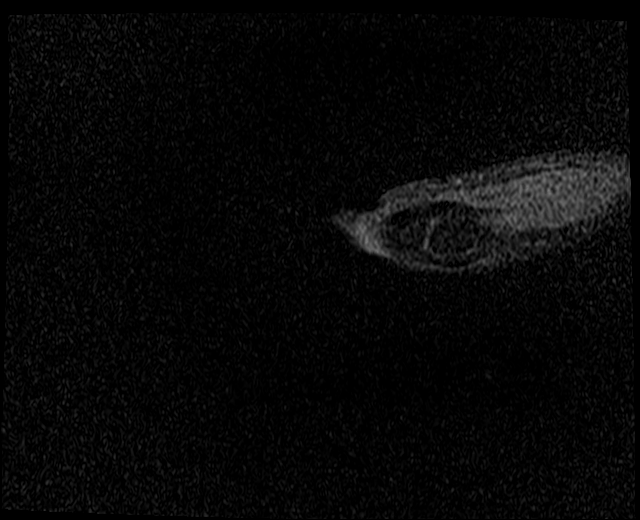
[im 34/34]
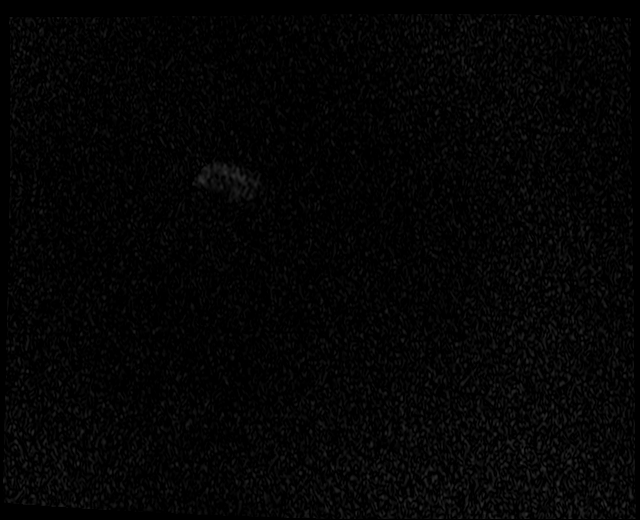

[Series 11: T2 fat-sat · axial · left · 4.0mm · 0.47mm/px · z∈[-82,+16]mm · 4 of 23 slices shown (2 of 2)]
[im 1/23]
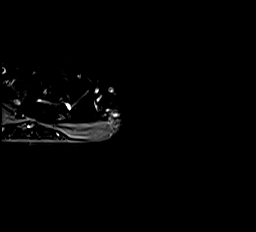
[im 8/23]
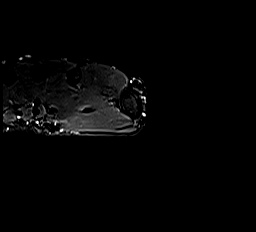
[im 15/23]
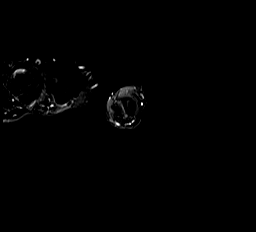
[im 23/23]
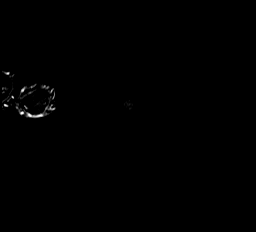

[Series 12: T1 · axial · left · 4.0mm · 0.47mm/px · z∈[-82,+17]mm · 4 of 23 slices shown (3 of 4)]
[im 1/23]
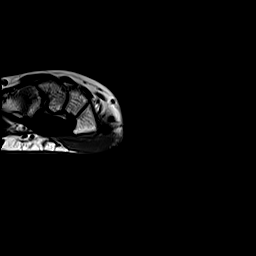
[im 8/23]
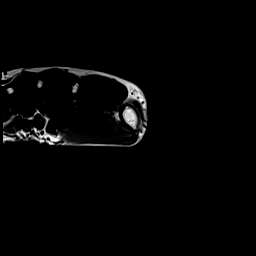
[im 15/23]
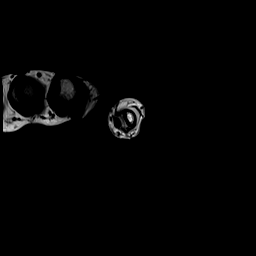
[im 23/23]
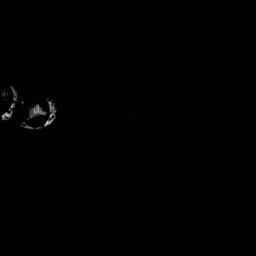

[Series 13: T1 · sagittal · left · 3.0mm · 0.55mm/px · 2 of 10 slices shown (4 of 4)]
[im 1/10]
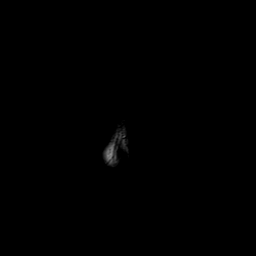
[im 10/10]
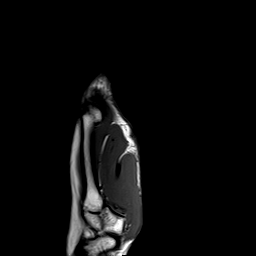

[Series 14: STIR · sagittal · left · 3.0mm · 0.55mm/px · 2 of 10 slices shown (2 of 2)]
[im 1/10]
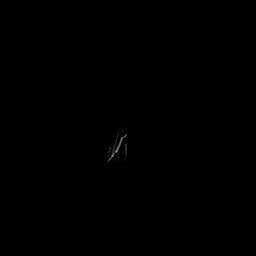
[im 10/10]
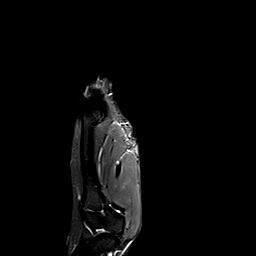

[Series 16: PD fat-sat · coronal · left · 3.0mm · 0.22mm/px · 2 of 12 slices shown (2 of 2)]
[im 1/12]
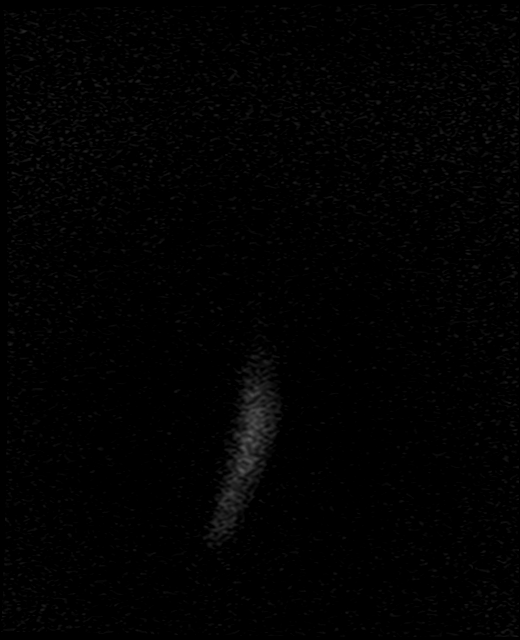
[im 12/12]
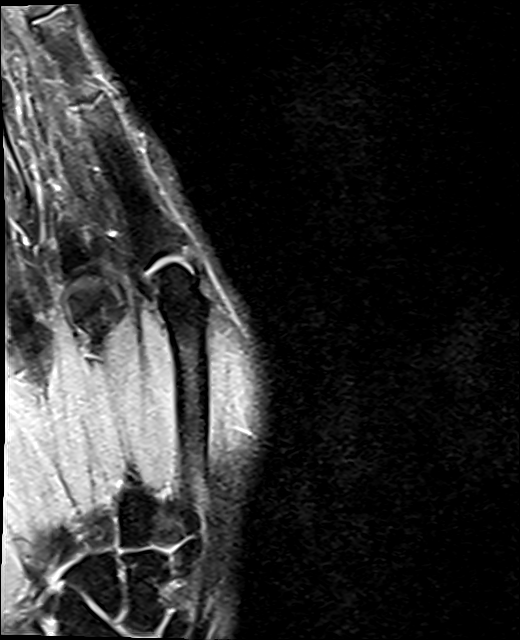

[40 of 40 positions shown; findings below may reference images not displayed]

FINDINGS: Bones/Joint/Cartilage

There is no acute fracture or dislocation. There is no aggressive
osseous lesion.

Ligaments

Thumb MCP: There is increased partial-thickness articular sided
intermediate signal intensity at the distal attachment of the ulnar
collateral ligament at the base of the proximal phalanx. This
suspected partial tear is intermediate-grade and appears chronic.
There is no significant retraction. There is no evidence of a Stener
lesion. The radial collateral ligament is intact.

Other: Thumb IP collateral ligaments are intact. Other MCP
collateral ligaments appear intact. There is no evidence of a pulley
injury.

Muscles and Tendons

No significant muscle edema or muscle atrophy. No acute tendon tear.

Soft tissues

No significant soft tissue edema.  No focal fluid collection.
IMPRESSION: Chronic, intermediate grade, partial tear of the thumb MCP ulnar
collateral ligament at its distal attachment on the proximal
phalanx. No retraction or evidence of a Stener lesion.

## 2024-01-02 DIAGNOSIS — K8020 Calculus of gallbladder without cholecystitis without obstruction: Secondary | ICD-10-CM | POA: Diagnosis not present

## 2024-01-02 LAB — PROTEIN ELECTROPHORESIS, SERUM, WITH REFLEX
Albumin ELP: 4.4 g/dL (ref 3.8–4.8)
Alpha 1: 0.3 g/dL (ref 0.2–0.3)
Alpha 2: 0.8 g/dL (ref 0.5–0.9)
Beta 2: 0.4 g/dL (ref 0.2–0.5)
Beta Globulin: 0.5 g/dL (ref 0.4–0.6)
Gamma Globulin: 1.2 g/dL (ref 0.8–1.7)
Total Protein: 7.6 g/dL (ref 6.1–8.1)

## 2024-01-02 LAB — QUANTIFERON-TB GOLD PLUS
Mitogen-NIL: 7.43 [IU]/mL
NIL: 0.04 [IU]/mL
QuantiFERON-TB Gold Plus: NEGATIVE
TB1-NIL: <0 [IU]/mL
TB2-NIL: <0 [IU]/mL

## 2024-01-02 LAB — HEPATITIS B CORE ANTIBODY, IGM: Hep B C IgM: NONREACTIVE

## 2024-01-02 LAB — IGG, IGA, IGM
IgG (Immunoglobin G), Serum: 1336 mg/dL (ref 600–1640)
IgM, Serum: 90 mg/dL (ref 50–300)
Immunoglobulin A: 181 mg/dL (ref 47–310)

## 2024-01-02 LAB — GLUCOSE 6 PHOSPHATE DEHYDROGENASE: G-6PDH: 15.2 U/g{Hb} (ref 7.0–20.5)

## 2024-01-02 LAB — HLA-B27 ANTIGEN: HLA-B27 Antigen: NEGATIVE

## 2024-01-02 LAB — HEPATITIS C ANTIBODY: Hepatitis C Ab: NONREACTIVE

## 2024-01-02 LAB — HEPATITIS B SURFACE ANTIGEN: Hepatitis B Surface Ag: NONREACTIVE

## 2024-01-03 ENCOUNTER — Ambulatory Visit: Payer: Self-pay | Admitting: Rheumatology

## 2024-01-03 ENCOUNTER — Encounter: Admitting: Physical Therapy

## 2024-01-03 NOTE — Progress Notes (Signed)
 Hepatitis B&C nonreactive, TB Gold negative, SPEP normal, immunoglobulins normal, G6PD normal.  I will discuss results at the follow-up visit.

## 2024-01-04 NOTE — Progress Notes (Deleted)
 Office Visit Note  Patient: [name] Curry             Date of Birth: 01-Sep-1980           MRN: [id number]             PCP: Oris Camie BRAVO, NP Referring: Oris Camie BRAVO, NP Visit Date: 01/18/2024 Occupation: @GUAROCC @  Subjective:  No chief complaint on file.   History of Present Illness: [name] Curry is a 43 y.o. female ***     Activities of Daily Living:  Patient reports morning stiffness for *** {minute/hour:19697}.   Patient {ACTIONS;DENIES/REPORTS:21021675::Denies} nocturnal pain.  Difficulty dressing/grooming: {ACTIONS;DENIES/REPORTS:21021675::Denies} Difficulty climbing stairs: {ACTIONS;DENIES/REPORTS:21021675::Denies} Difficulty getting out of chair: {ACTIONS;DENIES/REPORTS:21021675::Denies} Difficulty using hands for taps, buttons, cutlery, and/or writing: {ACTIONS;DENIES/REPORTS:21021675::Denies}  No Rheumatology ROS completed.   PMFS History:  Patient Active Problem List   Diagnosis Date Noted   Menorrhagia with irregular cycle 10/30/2023   Non-cardiac chest pain 05/03/2023   Pain, joint, pelvic region, left 05/03/2023   Penicillin allergy 07/27/2022   Bilateral carpal tunnel syndrome 06/19/2022   Muscle spasms of neck 02/06/2022   Beta thalassemia trait 10/07/2021   NSVT (nonsustained ventricular tachycardia) (HCC) 10/07/2021   Pre-diabetes 07/22/2021   Vitamin D  deficiency 07/22/2021   Mixed hyperlipidemia 07/22/2021   BMI 36.0-36.9,adult 07/14/2021   Family history of breast cancer in sister 10/22/2019   H/O Graves' disease 09/05/2016   Postsurgical hypothyroidism 12/01/2013    Past Medical History:  Diagnosis Date   Anemia    Anxiety    Arthritis    both hips   Beta thalassemia trait    Depression    Heart palpitations    History of gestational diabetes    History of Graves' disease 2015   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   History of pregnancy induced hypertension    History of suicidal behavior  07/2011   [facility] admission   Hyperlipidemia, mixed    Hypothyroidism, postsurgical 2015   followed by pcp  (previous endocrinologist--- dr trixie)  dx 2015  graves dx w/ toxic goiter  s/p  RAI 08-22-2013;   patient taking synthroid  became toxic 2017;   06-30-2016  s/p total thyroidectomy   Menorrhagia with irregular cycle    NSVT (nonsustained ventricular tachycardia) (HCC)    Sickle cell trait (HCC)    URI with cough and congestion 10/04/2023   10-18-2023  today pt stated has 2 days left of antibiotic and used rescue inhaler 3 days ago but no symptoms past 5 days;;   pt had UC visit @ [facility] [place] (office visit note in epic)  flu-like symptoms w/ productive cough , sob, sore throat;  dx acute pharygitis, acute cough, sob;  given antibiotic    Family History  Problem Relation Age of Onset   Hypertension Mother    Arthritis Mother    Hypertension Father    Hyperlipidemia Father    Congenital heart disease Sister    Breast cancer Sister 2   Diabetes Maternal Grandmother    Graves' disease Maternal Grandmother    Alzheimer's disease Maternal Grandmother    Diabetes Paternal Grandmother    Hypertension Paternal Grandmother    Cancer Paternal Grandfather        lung   Diabetes Other    Past Surgical History:  Procedure Laterality Date   DILATATION & CURRETTAGE/HYSTEROSCOPY WITH RESECTOCOPE N/A 10/30/2023   Procedure: DILATATION & CURETTAGE/HYSTEROSCOPY;  Surgeon: Cleotilde Ronal RAMAN, MD;  Location: [facility] OR;  Service: Gynecology;  Laterality: N/A;   EXCISIONAL HEMORRHOIDECTOMY  1996   HYDRADENITIS EXCISION Left 12/13/2021   Procedure: EXCISION HIDRADENITIS LEFT AXILLA;  Surgeon: Vernetta Berg, MD;  Location: [facility];  Service: General;  Laterality: Left;   HYDROTHERMAL ABLATION/NOVASURE N/A 10/30/2023   Procedure: DELBERTA ABLATION;  Surgeon: Cleotilde Ronal RAMAN, MD;  Location: [facility] OR;  Service: Gynecology;  Laterality: N/A;   LAPAROSCOPIC BILATERAL SALPINGECTOMY Bilateral  02/14/2023   Procedure: LAPAROSCOPIC BILATERAL SALPINGECTOMY;  Surgeon: Cleotilde Ronal RAMAN, MD;  Location: [facility];  Service: Gynecology;  Laterality: Bilateral;   THYROIDECTOMY N/A 06/30/2016   Procedure: TOTAL THYROIDECTOMY;  Surgeon: Krystal Spinner, MD;  Location: [facility] OR;  Service: General;  Laterality: N/A;   Social History   Social History Narrative   Not on file   Immunization History  Administered Date(s) Administered   Influenza,inj,Quad PF,6+ Mos 07/01/2016, 02/27/2018, 03/23/2021, 04/04/2022   Influenza-Unspecified 07/01/2016, 02/27/2018   PFIZER Comirnaty(Gray Top)Covid-19 Tri-Sucrose Vaccine 09/30/2020   PFIZER(Purple Top)SARS-COV-2 Vaccination 12/31/2019   Pneumococcal Polysaccharide-23 07/01/2016   Tdap 04/21/2014, 06/30/2022     Objective: Vital Signs: There were no vitals taken for this visit.   Physical Exam   Musculoskeletal Exam: ***  CDAI Exam: CDAI Score: -- Patient Global: --; Provider Global: -- Swollen: --; Tender: -- Joint Exam 01/18/2024   No joint exam has been documented for this visit   There is currently no information documented on the homunculus. Go to the Rheumatology activity and complete the homunculus joint exam.  Investigation: No additional findings.  Imaging: XR Lumbar Spine 2-3 Views Result Date: 12/27/2023 No significant disc space narrowing was noted.  No syndesmophytes were noted. Impression: Unremarkable x-rays of the lumbar spine.  XR Thoracic Spine 2 View Result Date: 12/27/2023 No significant disc space narrowing was noted.  No syndesmophytes were noted. Impression: Unremarkable x-rays of the thoracic spine.   Recent Labs: Lab Results  Component Value Date   WBC 8.8 10/30/2023   HGB 12.4 10/30/2023   PLT 391 10/30/2023   NA 138 10/30/2023   K 3.7 10/30/2023   CL 106 10/30/2023   CO2 26 10/30/2023   GLUCOSE 94 10/30/2023   BUN 6 10/30/2023   CREATININE 1.02 (H) 10/30/2023   BILITOT 0.5 08/05/2023    ALKPHOS 91 08/05/2023   AST 13 (L) 08/05/2023   ALT 17 08/05/2023   PROT 7.6 12/27/2023   ALBUMIN 4.9 08/05/2023   CALCIUM  8.9 10/30/2023   GFRAA >60 12/11/2017   QFTBGOLDPLUS NEGATIVE 12/27/2023   December 27, 2023 SPEP normal, TB Gold negative, immunoglobulins normal, hepatitis B nonreactive, hepatitis C nonreactive, G6PD normal, HLA-B27 negative  Speciality Comments: No specialty comments available.  Procedures:  No procedures performed Allergies: Atenolol , Methimazole , Penicillins, and Sulfa antibiotics   Assessment / Plan:     Visit Diagnoses: No diagnosis found.  Orders: No orders of the defined types were placed in this encounter.  No orders of the defined types were placed in this encounter.   Face-to-face time spent with patient was *** minutes. Greater than 50% of time was spent in counseling and coordination of care.  Follow-Up Instructions: No follow-ups on file.   Maya Nash, MD  Note - This record has been created using Animal nutritionist.  Chart creation errors have been sought, but may not always  have been located. Such creation errors do not reflect on  the standard of medical care.

## 2024-01-18 ENCOUNTER — Ambulatory Visit: Admitting: Rheumatology

## 2024-01-18 DIAGNOSIS — R7303 Prediabetes: Secondary | ICD-10-CM

## 2024-01-18 DIAGNOSIS — M458 Ankylosing spondylitis sacral and sacrococcygeal region: Secondary | ICD-10-CM

## 2024-01-18 DIAGNOSIS — N921 Excessive and frequent menstruation with irregular cycle: Secondary | ICD-10-CM

## 2024-01-18 DIAGNOSIS — E890 Postprocedural hypothyroidism: Secondary | ICD-10-CM

## 2024-01-18 DIAGNOSIS — M2550 Pain in unspecified joint: Secondary | ICD-10-CM

## 2024-01-18 DIAGNOSIS — Z803 Family history of malignant neoplasm of breast: Secondary | ICD-10-CM

## 2024-01-18 DIAGNOSIS — M62838 Other muscle spasm: Secondary | ICD-10-CM

## 2024-01-18 DIAGNOSIS — D563 Thalassemia minor: Secondary | ICD-10-CM

## 2024-01-18 DIAGNOSIS — G5603 Carpal tunnel syndrome, bilateral upper limbs: Secondary | ICD-10-CM

## 2024-01-18 DIAGNOSIS — E782 Mixed hyperlipidemia: Secondary | ICD-10-CM

## 2024-01-18 DIAGNOSIS — Z87891 Personal history of nicotine dependence: Secondary | ICD-10-CM

## 2024-01-18 DIAGNOSIS — E559 Vitamin D deficiency, unspecified: Secondary | ICD-10-CM

## 2024-01-18 DIAGNOSIS — M546 Pain in thoracic spine: Secondary | ICD-10-CM

## 2024-01-18 DIAGNOSIS — Z9851 Tubal ligation status: Secondary | ICD-10-CM

## 2024-01-18 DIAGNOSIS — Z79899 Other long term (current) drug therapy: Secondary | ICD-10-CM

## 2024-01-18 DIAGNOSIS — I4729 Other ventricular tachycardia: Secondary | ICD-10-CM

## 2024-01-18 DIAGNOSIS — Z6838 Body mass index (BMI) 38.0-38.9, adult: Secondary | ICD-10-CM

## 2024-01-18 DIAGNOSIS — M545 Low back pain, unspecified: Secondary | ICD-10-CM

## 2024-01-24 DIAGNOSIS — R635 Abnormal weight gain: Secondary | ICD-10-CM | POA: Diagnosis not present

## 2024-01-24 DIAGNOSIS — Z6838 Body mass index (BMI) 38.0-38.9, adult: Secondary | ICD-10-CM | POA: Diagnosis not present

## 2024-01-24 DIAGNOSIS — E66812 Obesity, class 2: Secondary | ICD-10-CM | POA: Diagnosis not present

## 2024-02-01 DIAGNOSIS — Z419 Encounter for procedure for purposes other than remedying health state, unspecified: Secondary | ICD-10-CM | POA: Diagnosis not present

## 2024-02-13 NOTE — Progress Notes (Addendum)
                                                Oral and Maxillofacial Surgery                                                            Post Op Note                                                        Dr. Mliss Catchings    S: Patient came in one day after after being packed for a dry socket #17 area. Patient stated area feels much better.  O: Physical exam shows packing in place. Packing was removed and socket irrigated. Area was repacked with gauze soaked in eugenol. Pt was given instructions on how to pack the area herself.    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 Advised patient to come back for one week post opt. Patient will return on 10/1 at 8:00 AM for a one week post opt. Patient was given cotton forceps to bring back at next apt. Julie Lesnick DDS Mckayla Rothrock DA II

## 2024-02-22 NOTE — Progress Notes (Deleted)
 Office Visit Note  Patient: [name] Curry             Date of Birth: 1981-05-18           MRN: [id number]             PCP: Oris Camie BRAVO, NP Referring: Oris Camie BRAVO, NP Visit Date: 03/07/2024 Occupation: Data Unavailable  Subjective:  No chief complaint on file.   History of Present Illness: [name] Curry is a 43 y.o. female ***     Activities of Daily Living:  Patient reports morning stiffness for *** {minute/hour:19697}.   Patient {ACTIONS;DENIES/REPORTS:21021675::Denies} nocturnal pain.  Difficulty dressing/grooming: {ACTIONS;DENIES/REPORTS:21021675::Denies} Difficulty climbing stairs: {ACTIONS;DENIES/REPORTS:21021675::Denies} Difficulty getting out of chair: {ACTIONS;DENIES/REPORTS:21021675::Denies} Difficulty using hands for taps, buttons, cutlery, and/or writing: {ACTIONS;DENIES/REPORTS:21021675::Denies}  No Rheumatology ROS completed.   PMFS History:  Patient Active Problem List   Diagnosis Date Noted   Menorrhagia with irregular cycle 10/30/2023   Non-cardiac chest pain 05/03/2023   Pain, joint, pelvic region, left 05/03/2023   Penicillin allergy 07/27/2022   Bilateral carpal tunnel syndrome 06/19/2022   Muscle spasms of neck 02/06/2022   Beta thalassemia trait 10/07/2021   NSVT (nonsustained ventricular tachycardia) (HCC) 10/07/2021   Pre-diabetes 07/22/2021   Vitamin D  deficiency 07/22/2021   Mixed hyperlipidemia 07/22/2021   BMI 36.0-36.9,adult 07/14/2021   Family history of breast cancer in sister 10/22/2019   H/O Graves' disease 09/05/2016   Postsurgical hypothyroidism 12/01/2013    Past Medical History:  Diagnosis Date   Anemia    Anxiety    Arthritis    both hips   Beta thalassemia trait    Depression    Heart palpitations    History of gestational diabetes    History of Graves' disease 2015   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   History of pregnancy induced hypertension    History of suicidal  behavior 07/2011   [facility] admission   Hyperlipidemia, mixed    Hypothyroidism, postsurgical 2015   followed by pcp  (previous endocrinologist--- dr trixie)  dx 2015  graves dx w/ toxic goiter  s/p  RAI 08-22-2013;   patient taking synthroid  became toxic 2017;   06-30-2016  s/p total thyroidectomy   Menorrhagia with irregular cycle    NSVT (nonsustained ventricular tachycardia) (HCC)    Sickle cell trait    URI with cough and congestion 10/04/2023   10-18-2023  today pt stated has 2 days left of antibiotic and used rescue inhaler 3 days ago but no symptoms past 5 days;;   pt had UC visit @ [facility] [place] (office visit note in epic)  flu-like symptoms w/ productive cough , sob, sore throat;  dx acute pharygitis, acute cough, sob;  given antibiotic    Family History  Problem Relation Age of Onset   Hypertension Mother    Arthritis Mother    Hypertension Father    Hyperlipidemia Father    Congenital heart disease Sister    Breast cancer Sister 71   Diabetes Maternal Grandmother    Graves' disease Maternal Grandmother    Alzheimer's disease Maternal Grandmother    Diabetes Paternal Grandmother    Hypertension Paternal Grandmother    Cancer Paternal Grandfather        lung   Diabetes Other    Past Surgical History:  Procedure Laterality Date   DILATATION & CURRETTAGE/HYSTEROSCOPY WITH RESECTOCOPE N/A 10/30/2023   Procedure: DILATATION & CURETTAGE/HYSTEROSCOPY;  Surgeon: Cleotilde Ronal RAMAN, MD;  Location: [facility] OR;  Service: Gynecology;  Laterality: N/A;   EXCISIONAL HEMORRHOIDECTOMY  1996   HYDRADENITIS EXCISION Left 12/13/2021   Procedure: EXCISION HIDRADENITIS LEFT AXILLA;  Surgeon: Vernetta Berg, MD;  Location: [facility];  Service: General;  Laterality: Left;   HYDROTHERMAL ABLATION/NOVASURE N/A 10/30/2023   Procedure: DELBERTA ABLATION;  Surgeon: Cleotilde Ronal RAMAN, MD;  Location: [facility] OR;  Service: Gynecology;  Laterality: N/A;   LAPAROSCOPIC BILATERAL SALPINGECTOMY  Bilateral 02/14/2023   Procedure: LAPAROSCOPIC BILATERAL SALPINGECTOMY;  Surgeon: Cleotilde Ronal RAMAN, MD;  [facility];  Service: Gynecology;  Laterality: Bilateral;   THYROIDECTOMY N/A 06/30/2016   Procedure: TOTAL THYROIDECTOMY;  Surgeon: Krystal Spinner, MD;  Location: [facility] OR;  Service: General;  Laterality: N/A;   Social History   Tobacco Use   Smoking status: Former    Current packs/day: 0.25    Average packs/day: 0.3 packs/day for 20.0 years (5.0 ttl pk-yrs)    Types: Cigars, Cigarettes    Passive exposure: Past   Smokeless tobacco: Never  Vaping Use   Vaping status: Never Used  Substance Use Topics   Alcohol use: Not Currently   Drug use: Not Currently   Social History   Social History Narrative   Not on file     Immunization History  Administered Date(s) Administered   Influenza,inj,Quad PF,6+ Mos 07/01/2016, 02/27/2018, 03/23/2021, 04/04/2022   Influenza-Unspecified 07/01/2016, 02/27/2018   PFIZER Comirnaty(Gray Top)Covid-19 Tri-Sucrose Vaccine 09/30/2020   PFIZER(Purple Top)SARS-COV-2 Vaccination 12/31/2019   Pneumococcal Polysaccharide-23 07/01/2016   Tdap 04/21/2014, 06/30/2022     Objective: Vital Signs: There were no vitals taken for this visit.   Physical Exam   Musculoskeletal Exam: ***  CDAI Exam: CDAI Score: -- Patient Global: --; Provider Global: -- Swollen: --; Tender: -- Joint Exam 03/07/2024   No joint exam has been documented for this visit   There is currently no information documented on the homunculus. Go to the Rheumatology activity and complete the homunculus joint exam.  Investigation: No additional findings.  Imaging: No results found.  Recent Labs: Lab Results  Component Value Date   WBC 8.8 10/30/2023   HGB 12.4 10/30/2023   PLT 391 10/30/2023   NA 138 10/30/2023   K 3.7 10/30/2023   CL 106 10/30/2023   CO2 26 10/30/2023   GLUCOSE 94 10/30/2023   BUN 6 10/30/2023   CREATININE 1.02 (H) 10/30/2023    BILITOT 0.5 08/05/2023   ALKPHOS 91 08/05/2023   AST 13 (L) 08/05/2023   ALT 17 08/05/2023   PROT 7.6 12/27/2023   ALBUMIN 4.9 08/05/2023   CALCIUM  8.9 10/30/2023   GFRAA >60 12/11/2017   QFTBGOLDPLUS NEGATIVE 12/27/2023   December 27, 2023 SPEP normal, TB Gold negative, immunoglobulins normal, hepatitis B&C nonreactive, G6PD normal, HLA-B27 negative  Speciality Comments: No specialty comments available.  Procedures:  No procedures performed Allergies: Atenolol , Methimazole , Penicillins, and Sulfa antibiotics   Assessment / Plan:     Visit Diagnoses: No diagnosis found.  Orders: No orders of the defined types were placed in this encounter.  No orders of the defined types were placed in this encounter.   Face-to-face time spent with patient was *** minutes. Greater than 50% of time was spent in counseling and coordination of care.  Follow-Up Instructions: No follow-ups on file.   Maya Nash, MD  Note - This record has been created using Animal nutritionist.  Chart creation errors have been sought, but may not always  have been located. Such creation errors  do not reflect on  the standard of medical care.

## 2024-03-02 ENCOUNTER — Other Ambulatory Visit: Payer: Self-pay | Admitting: Medical Genetics

## 2024-03-02 DIAGNOSIS — Z006 Encounter for examination for normal comparison and control in clinical research program: Secondary | ICD-10-CM

## 2024-03-07 ENCOUNTER — Ambulatory Visit: Admitting: Rheumatology

## 2024-03-07 DIAGNOSIS — M459 Ankylosing spondylitis of unspecified sites in spine: Secondary | ICD-10-CM

## 2024-03-07 DIAGNOSIS — M25521 Pain in right elbow: Secondary | ICD-10-CM

## 2024-03-07 DIAGNOSIS — Z79899 Other long term (current) drug therapy: Secondary | ICD-10-CM

## 2024-03-07 DIAGNOSIS — D563 Thalassemia minor: Secondary | ICD-10-CM

## 2024-03-07 DIAGNOSIS — N921 Excessive and frequent menstruation with irregular cycle: Secondary | ICD-10-CM

## 2024-03-07 DIAGNOSIS — M461 Sacroiliitis, not elsewhere classified: Secondary | ICD-10-CM

## 2024-03-07 DIAGNOSIS — M62838 Other muscle spasm: Secondary | ICD-10-CM

## 2024-03-07 DIAGNOSIS — R7303 Prediabetes: Secondary | ICD-10-CM

## 2024-03-07 DIAGNOSIS — Z9851 Tubal ligation status: Secondary | ICD-10-CM

## 2024-03-07 DIAGNOSIS — Z803 Family history of malignant neoplasm of breast: Secondary | ICD-10-CM

## 2024-03-07 DIAGNOSIS — M546 Pain in thoracic spine: Secondary | ICD-10-CM

## 2024-03-07 DIAGNOSIS — G8929 Other chronic pain: Secondary | ICD-10-CM

## 2024-03-07 DIAGNOSIS — Z87891 Personal history of nicotine dependence: Secondary | ICD-10-CM

## 2024-03-07 DIAGNOSIS — E890 Postprocedural hypothyroidism: Secondary | ICD-10-CM

## 2024-03-07 DIAGNOSIS — G5603 Carpal tunnel syndrome, bilateral upper limbs: Secondary | ICD-10-CM

## 2024-03-07 DIAGNOSIS — Z6838 Body mass index (BMI) 38.0-38.9, adult: Secondary | ICD-10-CM

## 2024-03-07 DIAGNOSIS — E782 Mixed hyperlipidemia: Secondary | ICD-10-CM

## 2024-03-07 DIAGNOSIS — E559 Vitamin D deficiency, unspecified: Secondary | ICD-10-CM

## 2024-03-07 DIAGNOSIS — I4729 Other ventricular tachycardia: Secondary | ICD-10-CM

## 2024-03-24 ENCOUNTER — Encounter: Payer: Self-pay | Admitting: Radiology

## 2024-03-24 ENCOUNTER — Encounter (HOSPITAL_BASED_OUTPATIENT_CLINIC_OR_DEPARTMENT_OTHER): Payer: Self-pay | Admitting: Obstetrics & Gynecology

## 2024-04-14 ENCOUNTER — Other Ambulatory Visit: Payer: Self-pay | Admitting: Nurse Practitioner

## 2024-04-14 DIAGNOSIS — Z1231 Encounter for screening mammogram for malignant neoplasm of breast: Secondary | ICD-10-CM

## 2024-04-28 ENCOUNTER — Other Ambulatory Visit: Payer: Self-pay | Admitting: Internal Medicine

## 2024-04-28 ENCOUNTER — Encounter: Payer: Self-pay | Admitting: Internal Medicine

## 2024-04-28 ENCOUNTER — Telehealth: Admitting: Internal Medicine

## 2024-04-28 DIAGNOSIS — E890 Postprocedural hypothyroidism: Secondary | ICD-10-CM | POA: Diagnosis not present

## 2024-04-28 DIAGNOSIS — Z8639 Personal history of other endocrine, nutritional and metabolic disease: Secondary | ICD-10-CM | POA: Diagnosis not present

## 2024-04-28 NOTE — Patient Instructions (Signed)
 Please continue Levothyroxine  100 mcg daily.  Take the thyroid  hormone every day, with water, at least 30 minutes before breakfast, separated by at least 4 hours from: - acid reflux medications - calcium  - iron  - multivitamins  Please come back for labs.  Please come back for a follow-up appointment in 6 months.

## 2024-04-28 NOTE — Progress Notes (Signed)
 Patient ID: [name] Curry, female   DOB: 27-Jul-1980, 43 y.o.   MRN: [id number]  Patient location: Home My location: Office Persons participating in the virtual visit: patient, provider  Referring Provider: Oris Camie BRAVO, NP  I connected with the patient on 04/28/24 at 9:15 AM EST by a video enabled telemedicine application and verified that I am speaking with the correct person.   I discussed the limitations of evaluation and management by telemedicine and the availability of in person appointments. The patient expressed understanding and agreed to proceed.   Details of the encounter are shown below.  HPI  [name] Curry is a 43 y.o.-year-old female, initially referred by Dr. 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  Last visit 6 months ago.  Previous visit 1 year and 4 months prior.  Interim history: Since last visit, she had uterine ablation in 10/2023 and then cholecystectomy in 12/2023.  At that time, she had an elevated lipase of 89.  However, she is currently feeling much better. She was on Wegovy , now off, but planning to restart after the first of the year.   Reviewed history: In 05/2013, she was seen in the ED for a peritonsillar abscess recently and was also found to have a goiter and thyrotoxicosis. Pt was started on MMI 10 mg bid and referred to me.  She developed hives on this.   She was also on Atenolol  25 mg daily >> stopped because of muscle cramps.  We checked an Uptake and scan that showed MNG and most likely Graves ds.  She stopped MMI 2 weeks prior to RAI tx, on 08/22/2013.  She became hypothyroid afterwards >> started Levothyroxine  75 mcg daily, but her dose was gradually decreased and then stopped last summer as she became thyrotoxic again. Unfortunately, she was lost for f/u afterwards as she lost her insurance.  She returned for a follow-up in 04/2016 >> was feeling poorly  and TFTs were thyrotoxic >> I referred her to surgery and she had total thyroidectomy on 06/30/2016 with Dr. Eletha.  She had some neck pain after surgery, now resolved.  We started levothyroxine  soon after surgery.  We initially started 125 mcg daily, but had to decrease the dose to 100 mcg daily and then to 88 mcg daily.   In 09/2021, TSH was suppressed so we decreased the dose of levothyroxine  to 88 mcg 6/7 days.  After pregnancy detected, we the dose to 75 mcg levothyroxine  9/7 days -after pregnancy detected.  In 02/2022, we changed the dose to 88 mcg levothyroxine  9/7 days during pregnancy.  Since last visit, we adjusted the dose of LT4 up, currently on 112 mcg daily, increased 04/2023.  In 10/2023, we decreased LT4 dose to 100 mcg daily.  She did not return for labs afterwards...  She takes this: - in am - fasting - at least 30 min from b'fast - no Ca, Fe, PPIs - off prenatal MVIs - off Biotin 5000 mcg   Latest TSH was normal: Lab Results  Component Value Date   TSH 0.26 (L) 10/26/2023   TSH 9.330 (H) 04/24/2023   TSH 1.90 01/08/2023   TSH 1.38 09/13/2022   TSH 3.44 08/01/2022   TSH 3.34 07/03/2022   TSH 2.88 02/27/2022   TSH 4.27 12/12/2021   TSH 0.18 (L) 10/04/2021   TSH 1.110 07/14/2021   FREET4 1.5 10/26/2023   FREET4 0.99 04/24/2023   FREET4 0.79 01/08/2023   FREET4 0.56 (  L) 08/01/2022   FREET4 0.60 07/03/2022   FREET4 0.70 02/27/2022   FREET4 0.97 12/12/2021   FREET4 0.99 10/04/2021   FREET4 1.00 04/20/2021   FREET4 0.99 09/28/2020  07/04/2023: TSH 0.464 01/13/2020: TSH 0.615  Her TSI antibodies were initially elevated but they became undetectably low: Lab Results  Component Value Date   TSI <89 07/03/2022   TSI <89 02/27/2018   TSI >700 (H) 05/01/2016   Pt denies: - feeling nodules in neck - hoarseness - dysphagia - choking  No FH of thyroid  cancer. No h/o radiation tx to head or neck. No herbal supplements. No Biotin use. No recent steroids  use.   She also has mild prediabetes-per PCP: 07/04/2023: HbA1c 6.0% Lab Results  Component Value Date   HGBA1C 6.0 (H) 04/24/2023   HGBA1C 6.0 07/03/2022   HGBA1C 6.0 02/27/2022   HGBA1C 5.8 10/04/2021   HGBA1C 5.9 (H) 07/14/2021   She had 2 OGTT's and I was able to review since last visit: normal: Component     Latest Ref Rng 05/04/2022 06/30/2022  Glucose, Fasting     70 - 91 mg/dL 79  85   Glucose, 1 hour     70 - 179 mg/dL 883  899   Glucose, 2 hour     70 - 152 mg/dL 75  84    Previously on Ozempic  mostly for weight loss.  Now off.  ROS: + see HPI  I reviewed pt's medications, allergies, PMH, social hx, family hx, and changes were documented in the history of present illness. Otherwise, unchanged from my initial visit note.  Past Medical History:  Diagnosis Date   Anemia    Anxiety    Arthritis    both hips   Beta thalassemia trait    Depression    Heart palpitations    History of gestational diabetes    History of Graves' disease 2015   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   History of pregnancy induced hypertension    History of suicidal behavior 07/2011   [facility] admission   Hyperlipidemia, mixed    Hypothyroidism, postsurgical 2015   followed by pcp  (previous endocrinologist--- dr trixie)  dx 2015  graves dx w/ toxic goiter  s/p  RAI 08-22-2013;   patient taking synthroid  became toxic 2017;   06-30-2016  s/p total thyroidectomy   Menorrhagia with irregular cycle    NSVT (nonsustained ventricular tachycardia) (HCC)    Sickle cell trait    URI with cough and congestion 10/04/2023   10-18-2023  today pt stated has 2 days left of antibiotic and used rescue inhaler 3 days ago but no symptoms past 5 days;;   pt had UC visit @ [facility] [place] (office visit note in epic)  flu-like symptoms w/ productive cough , sob, sore throat;  dx acute pharygitis, acute cough, sob;  given antibiotic   Past Surgical History:  Procedure Laterality Date    DILATATION & CURRETTAGE/HYSTEROSCOPY WITH RESECTOCOPE N/A 10/30/2023   Procedure: DILATATION & CURETTAGE/HYSTEROSCOPY;  Surgeon: Cleotilde Ronal RAMAN, MD;  Location: [facility] OR;  Service: Gynecology;  Laterality: N/A;   EXCISIONAL HEMORRHOIDECTOMY  1996   HYDRADENITIS EXCISION Left 12/13/2021   Procedure: EXCISION HIDRADENITIS LEFT AXILLA;  Surgeon: Vernetta Berg, MD;  Location: [facility];  Service: General;  Laterality: Left;   HYDROTHERMAL ABLATION/NOVASURE N/A 10/30/2023   Procedure: DELBERTA ABLATION;  Surgeon: Cleotilde Ronal RAMAN, MD;  Location: [facility] OR;  Service:  Gynecology;  Laterality: N/A;   LAPAROSCOPIC BILATERAL SALPINGECTOMY Bilateral 02/14/2023   Procedure: LAPAROSCOPIC BILATERAL SALPINGECTOMY;  Surgeon: Cleotilde Ronal RAMAN, MD;  Location: [facility];  Service: Gynecology;  Laterality: Bilateral;   THYROIDECTOMY N/A 06/30/2016   Procedure: TOTAL THYROIDECTOMY;  Surgeon: Krystal Spinner, MD;  Location: [facility] OR;  Service: General;  Laterality: N/A;   Social History   Socioeconomic History   Marital status: Married    Spouse name: Not on file   Number of children: Not on file   Years of education: Not on file   Highest education level: Not on file  Occupational History   Not on file  Tobacco Use   Smoking status: Former    Current packs/day: 0.25    Average packs/day: 0.3 packs/day for 20.0 years (5.0 ttl pk-yrs)    Types: Cigars, Cigarettes    Passive exposure: Past   Smokeless tobacco: Never  Vaping Use   Vaping status: Never Used  Substance and Sexual Activity   Alcohol use: Not Currently   Drug use: Not Currently   Sexual activity: Yes    Birth control/protection: Surgical    Comment: bilateral salpingectomy 09/ 2024  Other Topics Concern   Not on file  Social History Narrative   Not on file   Social Drivers of Health   Financial Resource Strain: Low Risk (11/21/2023)   Received from [facility]   Overall Financial Resource Strain (CARDIA)     Difficulty of Paying Living Expenses: Not hard at all  Food Insecurity: No Food Insecurity (11/21/2023)   Received from [facility]   Hunger Vital Sign    Within the past 12 months, you worried that your food would run out before you got the money to buy more.: Never true    Within the past 12 months, the food you bought just didn't last and you didn't have money to get more.: Never true  Transportation Needs: No Transportation Needs (11/21/2023)   [facility]    Lack of Transportation (Medical): No    Lack of Transportation (Non-Medical): No  Physical Activity: Inactive (11/21/2023)   Received from [facility]   Exercise Vital Sign    On average, how many days per week do you engage in moderate to strenuous exercise (like a brisk walk)?: 0 days    On average, how many minutes do you engage in exercise at this level?: 20 min  Stress: No Stress Concern Present (11/21/2023)   Received from [facility] - Occupational Stress Questionnaire    Feeling of Stress : Not at all  Social Connections: Socially Integrated (11/21/2023)   Received from [facility]   Social Network    How would you rate your social network (family, work, friends)?: Good participation with social networks  Intimate Partner Violence: Not At Risk (11/21/2023)   Received from [facility]   HITS    Over the last 12 months how often did your partner physically hurt you?: Never    Over the last 12 months how often did your partner insult you or talk down to you?: Never    Over the last 12 months how often did your partner threaten you with physical harm?: Never    Over the last 12 months how often did your partner scream or curse at you?: Never   Current Outpatient Medications on File Prior to Visit  Medication Sig Dispense Refill   albuterol  (  VENTOLIN  HFA) 108 (90 Base) MCG/ACT inhaler Inhale 1-2 puffs into the lungs every 6 (six) hours as  needed for wheezing or shortness of breath.     ibuprofen  (ADVIL ) 800 MG tablet Take 1 tablet (800 mg total) by mouth every 8 (eight) hours as needed. (Patient not taking: Reported on 12/27/2023) 20 tablet 0   levothyroxine  (SYNTHROID ) 100 MCG tablet Take 1 tablet (100 mcg total) by mouth daily before breakfast. 45 tablet 4   meloxicam  (MOBIC ) 15 MG tablet Take 1 tablet (15 mg total) by mouth daily. 30 tablet 1   metoprolol  tartrate (LOPRESSOR ) 25 MG tablet TAKE 1 TABLET BY MOUTH TWICE A DAY (Patient taking differently: Take 25 mg by mouth daily as needed (palpitations). On 10-18-2023  pt stated last taken one was 4-5 months ago) 180 tablet 0   traMADol  (ULTRAM ) 50 MG tablet Can take 2 tablets every 6 hours for a max dose of 400mg /day, if needed. 30 tablet 0   WEGOVY  1.7 MG/0.75ML SOAJ Inject 1.7 mg into the skin once a week. Saturday's (Patient not taking: Reported on 12/27/2023)     No current facility-administered medications on file prior to visit.   Allergies  Allergen Reactions   Atenolol  Other (See Comments)    Muscle cramps   Methimazole  Hives    And muscle spasms   Penicillins Hives and Nausea And Vomiting    Pt tolerated Cefazolin  preop 11/2021 Has patient had a PCN reaction causing immediate rash, facial/tongue/throat swelling, SOB or lightheadedness with hypotension: #  #  #  YES  #  #  #  Has patient had a PCN reaction causing severe rash involving mucus membranes or skin necrosis: No Has patient had a PCN reaction that required hospitalization No Has patient had a PCN reaction occurring within the last 10 years: No If all of the above answers are NO, then may proceed with Cephalosporin use.    Sulfa Antibiotics Hives and Nausea And Vomiting   Family History  Problem Relation Age of Onset   Hypertension Mother    Arthritis Mother    Hypertension Father    Hyperlipidemia Father    Congenital heart disease Sister    Breast cancer Sister 65   Diabetes Maternal Grandmother     Graves' disease Maternal Grandmother    Alzheimer's disease Maternal Grandmother    Diabetes Paternal Grandmother    Hypertension Paternal Grandmother    Cancer Paternal Grandfather        lung   Diabetes Other    PE: There were no vitals taken for this visit.  Wt Readings from Last 3 Encounters:  12/27/23 218 lb (98.9 kg)  11/06/23 210 lb 9.6 oz (95.5 kg)  10/30/23 210 lb (95.3 kg)   Constitutional:  in NAD  The physical exam was not performed (virtual visit).  ASSESSMENT: 1. Postablative hypothyroidism - after thyroidectomy for Graves' disease  - CT neck (06/05/2013):  Decrease in size of right tonsil abscess, previously 11 x 17 mm, now 6 x 6 mm with decreased inflammatory changes.  Thyromegaly is noted, with 19 mm AP isthmus and substernal extent. 8 mm left thyroid  nodule, with additional smaller nor nodules noted.  Prominent jugulodigastric lymph nodes, without pathologic features.  Apparent intimal thickening of the left internal carotid artery.   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  - 07/31/2013: Thyroid  Uptake and scan:  4 hr radio iodine  uptake is calculated at 46%,  above the normal range consistent with hyperthyroidism. Images in 3 projections demonstrate diffuse enlargement of thyroid  lobes bilaterally. Multiple small areas of slightly increased in decreased tracer localization identified suggesting diffuse small nodules. No dominant cold or hot nodule identified.  - 08/22/2013: RAI Tx 17.6 mCi.  She was off MMI x 2 weeks prior to the Uptake and scan  - 06/30/2016: Total thyroidectomy - Dr. Eletha  2.  History of Graves' disease  3. Prediabetes - per PCP - on Wegovy  now  PLAN:  1.  Patient with history of hyperthyroidism, initially considered due to either toxic multinodular goiter versus Graves' disease, with very high TSI antibodies pointing towards Graves' disease.  She  initially had thyrotoxic symptoms including weight loss, heat intolerance, tremors, palpitations, anxiety, all resolved after RAI treatment.  She developed post ablative hypothyroidism and was briefly on levothyroxine , however, she was then lost for follow-up and she had a recurrence of her thyrotoxicosis.  Therefore, I referred her to surgery and she had total thyroidectomy by Dr. Eletha in 2018.  We started levothyroxine  afterwards. - latest thyroid  labs reviewed with pt. >> TSH was suppressed: Lab Results  Component Value Date   TSH 0.26 (L) 10/26/2023  - she continues on LT4 100 mcg daily, dose decreased after the above results returned -she did not return for labs after adjustment in dose - pt feels good on this dose. - we discussed about taking the thyroid  hormone every day, with water, >30 minutes before breakfast, separated by >4 hours from acid reflux medications, calcium , iron , multivitamins. Pt. is taking it correctly. - will check thyroid  tests today: TSH and fT4 - If labs are abnormal, she will need to return for repeat TFTs in 1.5 months - OTW, I will see her back in 6 months  2.  History of Graves' disease - She has mild exophthalmos, most likely related to her Graves' disease - Latest TSI's were undetectable - she has no active signs of Graves' ophthalmopathy: No blurry vision, double vision, eye pain, chemosis  Needs refills.    Orders Placed This Encounter  Procedures   TSH   T4, free   T3, free   Lela Fendt, MD PhD [facility]ocr[facility]

## 2024-05-16 ENCOUNTER — Ambulatory Visit
Admission: RE | Admit: 2024-05-16 | Discharge: 2024-05-16 | Disposition: A | Discharge status: Home / Self Care | Source: Ambulatory Visit | Attending: Nurse Practitioner | Admitting: Nurse Practitioner

## 2024-05-16 DIAGNOSIS — Z1231 Encounter for screening mammogram for malignant neoplasm of breast: Secondary | ICD-10-CM

## 2024-05-21 ENCOUNTER — Ambulatory Visit: Payer: Self-pay | Admitting: Nurse Practitioner

## 2024-05-23 ENCOUNTER — Encounter (HOSPITAL_BASED_OUTPATIENT_CLINIC_OR_DEPARTMENT_OTHER): Payer: Self-pay

## 2024-05-23 ENCOUNTER — Ambulatory Visit (INDEPENDENT_AMBULATORY_CARE_PROVIDER_SITE_OTHER)

## 2024-05-23 VITALS — BP 115/97 | HR 88 | Ht 63.0 in | Wt 228.2 lb

## 2024-05-23 DIAGNOSIS — R350 Frequency of micturition: Secondary | ICD-10-CM

## 2024-05-23 DIAGNOSIS — R3915 Urgency of urination: Secondary | ICD-10-CM | POA: Diagnosis not present

## 2024-05-23 DIAGNOSIS — R109 Unspecified abdominal pain: Secondary | ICD-10-CM

## 2024-05-23 LAB — POCT URINALYSIS DIPSTICK
Bilirubin, UA: NEGATIVE
Blood, UA: NEGATIVE
Glucose, UA: NEGATIVE
Ketones, UA: NEGATIVE
Leukocytes, UA: NEGATIVE
Nitrite, UA: NEGATIVE
Protein, UA: NEGATIVE
Spec Grav, UA: >=1.03 — AB
Urobilinogen, UA: 0.2 U/dL
pH, UA: 5.5

## 2024-05-23 NOTE — Progress Notes (Signed)
 NURSE VISIT- UTI SYMPTOMS   SUBJECTIVE:  [name] Curry is a 44 y.o. [id number] female here for UTI symptoms. She is a GYN patient. She reports lower abdominal pain, urinary frequency, and urinary urgency.  OBJECTIVE:  There were no vitals taken for this visit.  Appears well, in no apparent distress  No results found for this or any previous visit (from the past 24 hours).  ASSESSMENT: GYN patient with UTI symptoms and negative nitrites  PLAN: Visit routed to or discussed with:  Rx sent today: No Patient wanted to wait for culture results.  Urine culture sent Call or return to clinic prn if these symptoms worsen or fail to improve as anticipated. Follow-up: as needed

## 2024-05-25 ENCOUNTER — Ambulatory Visit (HOSPITAL_BASED_OUTPATIENT_CLINIC_OR_DEPARTMENT_OTHER): Payer: Self-pay | Admitting: Obstetrics & Gynecology

## 2024-05-25 LAB — URINE CULTURE, OB REFLEX

## 2024-05-25 LAB — CULTURE, OB URINE

## 2024-06-11 ENCOUNTER — Ambulatory Visit: Admitting: Orthopedic Surgery

## 2024-06-19 ENCOUNTER — Ambulatory Visit: Admitting: Orthopaedic Surgery

## 2024-06-19 ENCOUNTER — Other Ambulatory Visit (INDEPENDENT_AMBULATORY_CARE_PROVIDER_SITE_OTHER): Payer: Self-pay

## 2024-06-19 DIAGNOSIS — M25521 Pain in right elbow: Secondary | ICD-10-CM

## 2024-06-19 DIAGNOSIS — M25522 Pain in left elbow: Secondary | ICD-10-CM | POA: Diagnosis not present

## 2024-06-19 NOTE — Progress Notes (Signed)
 "  Office Visit Note   Patient: [name] Curry           Date of Birth: 10-16-1980           MRN: [id number] Visit Date: 06/19/2024              Requested by: Oris Camie BRAVO, NP [address] [place],  [place] [address] PCP: Oris Camie BRAVO, NP   Assessment & Plan: Visit Diagnoses:  1. Pain in left elbow   2. Pain in right elbow     Plan: Impression is bilateral elbow lateral epicondylitis worse on the right.  Disease process explained and treatment options were discussed.  Will start with counterforce brace, home exercise program, referral to outpatient physical therapy for modalities.  She will follow-up if symptoms persist or she wants to try an injection.  Follow-Up Instructions: Return if symptoms worsen or fail to improve.   Orders:  Orders Placed This Encounter  Procedures   XR Elbow 2 Views Left   XR Elbow 2 Views Right   Ambulatory referral to Physical Therapy   No orders of the defined types were placed in this encounter.     Procedures: No procedures performed   Clinical Data: No additional findings.   Subjective: Chief Complaint  Patient presents with   Right Elbow - Pain   Left Elbow - Pain    HPI [name] Curry is a 44 year old female with bilateral elbow pain worse on the right for months.  Denies any injuries.  Feels stiffness.  Denies any swelling or bruising. Review of Systems  Constitutional: Negative.   HENT: Negative.    Eyes: Negative.   Respiratory: Negative.    Cardiovascular: Negative.   Endocrine: Negative.   Musculoskeletal: Negative.   Neurological: Negative.   Hematological: Negative.   Psychiatric/Behavioral: Negative.    All other systems reviewed and are negative.    Objective: Vital Signs: There were no vitals taken for this visit.  Physical Exam Vitals and nursing note reviewed.  Constitutional:      Appearance: She is well-developed.  HENT:     Head: Atraumatic.     Nose: Nose normal.  Eyes:     Extraocular  Movements: Extraocular movements intact.  Cardiovascular:     Pulses: Normal pulses.  Pulmonary:     Effort: Pulmonary effort is normal.  Abdominal:     Palpations: Abdomen is soft.  Musculoskeletal:     Cervical back: Neck supple.  Skin:    General: Skin is warm.     Capillary Refill: Capillary refill takes less than 2 seconds.  Neurological:     Mental Status: She is alert. Mental status is at baseline.  Psychiatric:        Behavior: Behavior normal.        Thought Content: Thought content normal.        Judgment: Judgment normal.     Ortho Exam Exam of bilateral elbows show tenderness to the lateral epicondyle.  Radial tunnel is nontender.  Full elbow range of motion.  Positive Maudsley's sign. Specialty Comments:  No specialty comments available.  Imaging: No results found.   PMFS History: Patient Active Problem List   Diagnosis Date Noted   Menorrhagia with irregular cycle 10/30/2023   Non-cardiac chest pain 05/03/2023   Pain, joint, pelvic region, left 05/03/2023   Penicillin allergy 07/27/2022   Bilateral carpal tunnel syndrome 06/19/2022   Muscle spasms of neck 02/06/2022   Beta thalassemia trait 10/07/2021   NSVT (nonsustained  ventricular tachycardia) (HCC) 10/07/2021   Pre-diabetes 07/22/2021   Vitamin D  deficiency 07/22/2021   Mixed hyperlipidemia 07/22/2021   BMI 36.0-36.9,adult 07/14/2021   Family history of breast cancer in sister 10/22/2019   H/O Graves' disease 09/05/2016   Postsurgical hypothyroidism 12/01/2013   Past Medical History:  Diagnosis Date   Anemia    Anxiety    Arthritis    both hips   Beta thalassemia trait    Depression    Heart palpitations    History of gestational diabetes    History of Graves' disease 2015   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   History of pregnancy induced hypertension    History of suicidal behavior 07/2011   [facility] admission   Hyperlipidemia, mixed    Hypothyroidism,  postsurgical 2015   followed by pcp  (previous endocrinologist--- dr trixie)  dx 2015  graves dx w/ toxic goiter  s/p  RAI 08-22-2013;   patient taking synthroid  became toxic 2017;   06-30-2016  s/p total thyroidectomy   Menorrhagia with irregular cycle    NSVT (nonsustained ventricular tachycardia) (HCC)    Sickle cell trait    URI with cough and congestion 10/04/2023   10-18-2023  today pt stated has 2 days left of antibiotic and used rescue inhaler 3 days ago but no symptoms past 5 days;;   pt had UC visit @ [facility] [place] (office visit note in epic)  flu-like symptoms w/ productive cough , sob, sore throat;  dx acute pharygitis, acute cough, sob;  given antibiotic    Family History  Problem Relation Age of Onset   Hypertension Mother    Arthritis Mother    Hypertension Father    Hyperlipidemia Father    Congenital heart disease Sister    Breast cancer Sister 12   Diabetes Maternal Grandmother    Graves' disease Maternal Grandmother    Alzheimer's disease Maternal Grandmother    Diabetes Paternal Grandmother    Hypertension Paternal Grandmother    Cancer Paternal Grandfather        lung   Diabetes Other     Past Surgical History:  Procedure Laterality Date   DILATATION & CURRETTAGE/HYSTEROSCOPY WITH RESECTOCOPE N/A 10/30/2023   Procedure: DILATATION & CURETTAGE/HYSTEROSCOPY;  Surgeon: Cleotilde Ronal RAMAN, MD;  Location: [facility] OR;  Service: Gynecology;  Laterality: N/A;   EXCISIONAL HEMORRHOIDECTOMY  1996   HYDRADENITIS EXCISION Left 12/13/2021   Procedure: EXCISION HIDRADENITIS LEFT AXILLA;  Surgeon: Vernetta Berg, MD;  Location: [facility];  Service: General;  Laterality: Left;   HYDROTHERMAL ABLATION/NOVASURE N/A 10/30/2023   Procedure: DELBERTA ABLATION;  Surgeon: Cleotilde Ronal RAMAN, MD;  Location: [facility] OR;  Service: Gynecology;  Laterality: N/A;   LAPAROSCOPIC BILATERAL SALPINGECTOMY Bilateral 02/14/2023   Procedure: LAPAROSCOPIC BILATERAL SALPINGECTOMY;  Surgeon:  Cleotilde Ronal RAMAN, MD;  Location: [facility];  Service: Gynecology;  Laterality: Bilateral;   THYROIDECTOMY N/A 06/30/2016   Procedure: TOTAL THYROIDECTOMY;  Surgeon: Krystal Spinner, MD;  Location: [facility] OR;  Service: General;  Laterality: N/A;   Social History   Occupational History   Not on file  Tobacco Use   Smoking status: Former    Current packs/day: 0.25    Average packs/day: 0.3 packs/day for 20.0 years (5.0 ttl pk-yrs)    Types: Cigars, Cigarettes    Passive exposure: Past   Smokeless tobacco: Never  Vaping Use   Vaping status: Never Used  Substance and Sexual Activity  Alcohol use: Not Currently   Drug use: Not Currently   Sexual activity: Yes    Birth control/protection: Surgical    Comment: bilateral salpingectomy 09/ 2024        "

## 2024-06-25 ENCOUNTER — Ambulatory Visit: Admitting: Orthopedic Surgery

## 2024-06-25 NOTE — Therapy (Incomplete)
 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   History of pregnancy induced hypertension    History of suicidal behavior 07/2011   [facility] admission   Hyperlipidemia, mixed    Hypothyroidism, postsurgical 2015   followed by pcp  (previous endocrinologist--- dr trixie)  dx 2015  graves dx w/ toxic goiter  s/p  RAI 08-22-2013;   patient taking synthroid  became toxic 2017;   06-30-2016  s/p total thyroidectomy   Menorrhagia with irregular cycle    NSVT (nonsustained ventricular tachycardia) (HCC)    Sickle cell trait    URI with cough and congestion 10/04/2023   10-18-2023  today pt stated has 2 days left of antibiotic and used rescue inhaler 3 days ago but no symptoms past 5 days;;   pt had UC visit @ [facility] [place] (office visit note in epic)  flu-like symptoms w/ productive cough , sob, sore throat;  dx acute pharygitis, acute cough, sob;  given antibiotic   Past Surgical History:  Procedure Laterality Date   DILATATION & CURRETTAGE/HYSTEROSCOPY WITH RESECTOCOPE N/A 10/30/2023   Procedure: DILATATION & CURETTAGE/HYSTEROSCOPY;  Surgeon: Cleotilde Ronal RAMAN, MD;  Location: [facility] OR;  Service: Gynecology;  Laterality: N/A;   EXCISIONAL HEMORRHOIDECTOMY  1996   HYDRADENITIS EXCISION Left 12/13/2021   Procedure: EXCISION HIDRADENITIS LEFT AXILLA;  Surgeon: Vernetta Berg, MD;  Location: [facility];  Service: General;  Laterality: Left;   HYDROTHERMAL ABLATION/NOVASURE N/A 10/30/2023   Procedure: DELBERTA ABLATION;  Surgeon: Cleotilde Ronal RAMAN, MD;  Location: [facility] OR;   Service: Gynecology;  Laterality: N/A;   LAPAROSCOPIC BILATERAL SALPINGECTOMY Bilateral 02/14/2023   Procedure: LAPAROSCOPIC BILATERAL SALPINGECTOMY;  Surgeon: Cleotilde Ronal RAMAN, MD;  Location: [facility];  Service: Gynecology;  Laterality: Bilateral;   THYROIDECTOMY N/A 06/30/2016   Procedure: TOTAL THYROIDECTOMY;  Surgeon: Krystal Spinner, MD;  Location: [facility] OR;  Service: General;  Laterality: N/A;   Patient Active Problem List   Diagnosis Date Noted   Menorrhagia with irregular cycle 10/30/2023   Non-cardiac chest pain 05/03/2023   Pain, joint, pelvic region, left 05/03/2023   Penicillin allergy 07/27/2022   Bilateral carpal tunnel syndrome 06/19/2022   Muscle spasms of neck 02/06/2022   Beta thalassemia trait 10/07/2021   NSVT (nonsustained ventricular tachycardia) (HCC) 10/07/2021   Pre-diabetes 07/22/2021   Vitamin D  deficiency 07/22/2021   Mixed hyperlipidemia 07/22/2021   BMI 36.0-36.9,adult 07/14/2021   Family history of breast cancer in sister 10/22/2019   H/O Graves' disease 09/05/2016   Postsurgical hypothyroidism 12/01/2013    PCP: Oris Camie BRAVO, NP   REFERRING PROVIDER: Jerri Kay HERO, MD  REFERRING DIAG: [phone] (ICD-10-CM) - Pain in left elbow M25.521 (ICD-10-CM) - Pain in right elbow  THERAPY DIAG:  No diagnosis found.  Rationale for Evaluation and Treatment: Rehabilitation  ONSET DATE: ***  SUBJECTIVE:  SUBJECTIVE STATEMENT: *** Hand dominance: {MISC; OT HAND DOMINANCE:[phone]}  PERTINENT HISTORY: S/P laparascopic bilateral salpingectomy on 02/14/2023, Depression, anxiety, OA,   PAIN:  Are you having pain? {OPRCPAIN:27236}  PRECAUTIONS: {Therapy precautions:24002}  RED FLAGS: {PT Red Flags:29287}   WEIGHT BEARING RESTRICTIONS: {Yes ***/No:24003}  FALLS:   Has patient fallen in last 6 months? {fallsyesno:27318}  LIVING ENVIRONMENT: Lives with: {OPRC lives with:25569::lives with their family} Lives in: {Lives in:25570} Stairs: {opstairs:27293} Has following equipment at home: {Assistive devices:23999}  OCCUPATION: ***  PLOF: {PLOF:24004}  PATIENT GOALS: ***  NEXT MD VISIT: ***  OBJECTIVE:  Note: Objective measures were completed at Evaluation unless otherwise noted.  DIAGNOSTIC FINDINGS:  06/19/2024 bilateral  Xray  No acute or structural abnormalities.   PATIENT SURVEYS :  {rehab surveys:24030:a}  COGNITION: Overall cognitive status: {cognition:24006}     SENSATION: {sensation:27233}  POSTURE: ***  UPPER EXTREMITY ROM:   {AROM/PROM:27142} ROM Right eval Left eval  Shoulder flexion    Shoulder extension    Shoulder abduction    Shoulder adduction    Shoulder internal rotation    Shoulder external rotation    Elbow flexion    Elbow extension    Wrist flexion    Wrist extension    Wrist ulnar deviation    Wrist radial deviation    Wrist pronation    Wrist supination    (Blank rows = not tested)  UPPER EXTREMITY MMT:  MMT Right eval Left eval  Shoulder flexion    Shoulder extension    Shoulder abduction    Shoulder adduction    Shoulder internal rotation    Shoulder external rotation    Middle trapezius    Lower trapezius    Elbow flexion    Elbow extension    Wrist flexion    Wrist extension    Wrist ulnar deviation    Wrist radial deviation    Wrist pronation    Wrist supination    Grip strength (lbs)    (Blank rows = not tested)  SHOULDER SPECIAL TESTS: Impingement tests: {shoulder impingement test:25231:a} SLAP lesions: {SLAP lesions:25232} Instability tests: {shoulder instability test:25233} Rotator cuff assessment: {rotator cuff assessment:25234} Biceps assessment: {biceps assessment:25235}  JOINT MOBILITY TESTING:  ***  PALPATION:  ***                                                                                                                              TREATMENT DATE: ***   PATIENT EDUCATION: Education details: *** Person educated: {Person educated:25204} Education method: {Education Method:25205} Education comprehension: {Education Comprehension:25206}  HOME EXERCISE PROGRAM: ***  ASSESSMENT:  CLINICAL IMPRESSION: Patient is a *** y.o. *** who was seen today for physical therapy evaluation and treatment for ***.    OBJECTIVE IMPAIRMENTS: {opptimpairments:25111}.   ACTIVITY LIMITATIONS: {activitylimitations:27494}  PARTICIPATION LIMITATIONS: {participationrestrictions:25113}  PERSONAL FACTORS: {Personal factors:25162} are also affecting patient's functional outcome.   REHAB POTENTIAL: {rehabpotential:25112}  CLINICAL DECISION MAKING: {clinical decision making:25114}  EVALUATION COMPLEXITY: {  Evaluation complexity:25115}  GOALS: Goals reviewed with patient? {yes/no:20286}  SHORT TERM GOALS: Target date: ***  *** Baseline: Goal status: {GOALSTATUS:25110}  2.  *** Baseline:  Goal status: {GOALSTATUS:25110}  3.  *** Baseline:  Goal status: {GOALSTATUS:25110}  4.  *** Baseline:  Goal status: {GOALSTATUS:25110}  5.  *** Baseline:  Goal status: {GOALSTATUS:25110}  6.  *** Baseline:  Goal status: {GOALSTATUS:25110}  LONG TERM GOALS: Target date: ***  *** Baseline:  Goal status: {GOALSTATUS:25110}  2.  *** Baseline:  Goal status: {GOALSTATUS:25110}  3.  *** Baseline:  Goal status: {GOALSTATUS:25110}  4.  *** Baseline:  Goal status: {GOALSTATUS:25110}  5.  *** Baseline:  Goal status: {GOALSTATUS:25110}  6.  *** Baseline:  Goal status: {GOALSTATUS:25110}  PLAN: PT FREQUENCY: {rehab frequency:25116}  PT DURATION: {rehab duration:25117}  PLANNED INTERVENTIONS: {rehab planned interventions:25118::97110-Therapeutic exercises,97530- Therapeutic [phone]- Neuromuscular re-education,97535-  Self Rjmz,02859- Manual therapy,Patient/Family education}  PLAN FOR NEXT SESSION: PIERRETTE Kristeen Sar, PT 06/25/2024, 4:06 PM  "

## 2024-06-26 ENCOUNTER — Ambulatory Visit: Admitting: Physical Therapy

## 2024-07-07 ENCOUNTER — Ambulatory Visit: Admitting: Physical Therapy
